# Patient Record
Sex: Female | Born: 1955
Health system: Southern US, Community
[De-identification: ages and names within clinical notes are randomized; demographics above are authoritative.]

## PROBLEM LIST (undated history)

## (undated) DIAGNOSIS — C801 Malignant (primary) neoplasm, unspecified: Secondary | ICD-10-CM

## (undated) DIAGNOSIS — Z803 Family history of malignant neoplasm of breast: Secondary | ICD-10-CM

## (undated) DIAGNOSIS — E785 Hyperlipidemia, unspecified: Secondary | ICD-10-CM

## (undated) HISTORY — DX: Family history of malignant neoplasm of breast: Z80.3

## (undated) HISTORY — PX: CARPAL TUNNEL RELEASE: SHX101

## (undated) HISTORY — PX: TUBAL LIGATION: SHX77

---

## 2005-03-10 ENCOUNTER — Other Ambulatory Visit: Payer: Self-pay

## 2005-03-10 ENCOUNTER — Observation Stay: Payer: Self-pay | Admitting: Internal Medicine

## 2005-03-10 ENCOUNTER — Ambulatory Visit: Payer: Self-pay | Admitting: Internal Medicine

## 2005-06-08 ENCOUNTER — Ambulatory Visit: Payer: Self-pay | Admitting: Internal Medicine

## 2005-09-20 ENCOUNTER — Ambulatory Visit: Payer: Self-pay

## 2009-02-26 ENCOUNTER — Emergency Department: Payer: Self-pay | Admitting: Emergency Medicine

## 2009-04-23 ENCOUNTER — Ambulatory Visit: Payer: Self-pay | Admitting: Internal Medicine

## 2009-05-26 ENCOUNTER — Ambulatory Visit: Payer: Self-pay | Admitting: Gastroenterology

## 2009-11-14 LAB — HM COLONOSCOPY: HM Colonoscopy: NORMAL

## 2009-11-14 LAB — HM PAP SMEAR: HM Pap smear: NORMAL

## 2010-07-22 ENCOUNTER — Ambulatory Visit: Payer: Self-pay | Admitting: Internal Medicine

## 2011-10-18 ENCOUNTER — Telehealth: Payer: Self-pay | Admitting: Internal Medicine

## 2011-10-18 DIAGNOSIS — Z1231 Encounter for screening mammogram for malignant neoplasm of breast: Secondary | ICD-10-CM

## 2011-10-18 NOTE — Telephone Encounter (Signed)
That is fine 

## 2011-10-18 NOTE — Telephone Encounter (Signed)
Pt wants referral for screening mammogram. OK? I scheduled her for physical in May

## 2011-10-18 NOTE — Telephone Encounter (Signed)
Patient would like you to call her at [phone].  She needs a mammogram appointment. She uses [facility].

## 2011-10-18 NOTE — Telephone Encounter (Signed)
Order entered

## 2011-11-10 ENCOUNTER — Telehealth: Payer: Self-pay | Admitting: Internal Medicine

## 2011-11-10 NOTE — Telephone Encounter (Signed)
[phone] Pt called  Pt wanted to get mammogram appointment  She has funny feeling in breast.   She doesn't feel any knot  Just concerned.

## 2011-11-11 NOTE — Telephone Encounter (Signed)
Appointment 4/8 pt aware

## 2011-11-11 NOTE — Telephone Encounter (Signed)
Yes, should be seen to determine if need for ultrasound and/or diagnostic mammo.

## 2011-11-11 NOTE — Telephone Encounter (Signed)
Do you want to see pt for quick OV/breast exam first?

## 2011-11-15 ENCOUNTER — Ambulatory Visit (INDEPENDENT_AMBULATORY_CARE_PROVIDER_SITE_OTHER): Payer: BC Managed Care – PPO | Admitting: Internal Medicine

## 2011-11-15 ENCOUNTER — Encounter: Payer: Self-pay | Admitting: Internal Medicine

## 2011-11-15 VITALS — BP 119/81 | HR 69 | Temp 98.0°F | Ht 64.25 in | Wt 218.2 lb

## 2011-11-15 DIAGNOSIS — Z0000 Encounter for general adult medical examination without abnormal findings: Secondary | ICD-10-CM

## 2011-11-15 DIAGNOSIS — N644 Mastodynia: Secondary | ICD-10-CM

## 2011-11-15 LAB — HM MAMMOGRAPHY

## 2011-11-15 NOTE — Assessment & Plan Note (Signed)
Most likely related to hormonal changes after menopause and/or fibrocystic changes with exposure to caffeine or other agents. Exam is normal today. Will get bilateral diagnostic mammogram for evaluation.

## 2011-11-15 NOTE — Progress Notes (Signed)
  Subjective:    Patient ID: Linda Schmitt, female    DOB: 1955/12/13, 56 y.o.   MRN: [id number]  HPI 55YO female presents for follow up. Primary concern today is several weeks of bilateral breast "tingling" pain. No palpable masses, overlying skin changes, or nipple discharge noted. No correlation with intake of caffeine or other food products. Has not had breast pain in the past. Has never had abnormal mammogram.  No outpatient encounter prescriptions on file as of 11/15/2011.    Review of Systems  Constitutional: Negative for fever, chills, appetite change, fatigue and unexpected weight change.  HENT: Negative for ear pain, congestion, sore throat, trouble swallowing, neck pain, voice change and sinus pressure.   Eyes: Negative for visual disturbance.  Respiratory: Negative for cough, shortness of breath, wheezing and stridor.   Cardiovascular: Positive for chest pain. Negative for palpitations and leg swelling.  Gastrointestinal: Negative for nausea, vomiting, abdominal pain, diarrhea, constipation, blood in stool, abdominal distention and anal bleeding.  Genitourinary: Negative for dysuria and flank pain.  Musculoskeletal: Negative for myalgias, arthralgias and gait problem.  Skin: Negative for color change and rash.  Neurological: Negative for dizziness and headaches.  Hematological: Negative for adenopathy. Does not bruise/bleed easily.  Psychiatric/Behavioral: Negative for suicidal ideas, sleep disturbance and dysphoric mood. The patient is not nervous/anxious.    BP 119/81  Pulse 69  Temp(Src) 98 F (36.7 C) (Oral)  Ht 5' 4.25" (1.632 m)  Wt 218 lb 4 oz (98.998 kg)  BMI 37.17 kg/m2  SpO2 98%     Objective:   Physical Exam  Constitutional: She is oriented to person, place, and time. She appears well-developed and well-nourished. No distress.  HENT:  Head: Normocephalic and atraumatic.  Right Ear: External ear normal.  Left Ear: External ear normal.  Nose: Nose normal.    Mouth/Throat: Oropharynx is clear and moist. No oropharyngeal exudate.  Eyes: Conjunctivae are normal. Pupils are equal, round, and reactive to light. Right eye exhibits no discharge. Left eye exhibits no discharge. No scleral icterus.  Neck: Normal range of motion. Neck supple. No tracheal deviation present. No thyromegaly present.  Cardiovascular: Normal rate, regular rhythm, normal heart sounds and intact distal pulses.  Exam reveals no gallop and no friction rub.   No murmur heard. Pulmonary/Chest: Effort normal and breath sounds normal. No respiratory distress. She has no wheezes. She has no rales. She exhibits no tenderness. Right breast exhibits no inverted nipple, no mass, no nipple discharge, no skin change and no tenderness. Left breast exhibits no inverted nipple, no mass, no nipple discharge, no skin change and no tenderness. Breasts are symmetrical.  Musculoskeletal: Normal range of motion. She exhibits no edema and no tenderness.  Lymphadenopathy:    She has no cervical adenopathy.  Neurological: She is alert and oriented to person, place, and time. No cranial nerve deficit. She exhibits normal muscle tone. Coordination normal.  Skin: Skin is warm and dry. No rash noted. She is not diaphoretic. No erythema. No pallor.  Psychiatric: She has a normal mood and affect. Her behavior is normal. Judgment and thought content normal.          Assessment & Plan:

## 2011-11-16 ENCOUNTER — Ambulatory Visit: Payer: Self-pay | Admitting: Internal Medicine

## 2011-11-19 ENCOUNTER — Other Ambulatory Visit (INDEPENDENT_AMBULATORY_CARE_PROVIDER_SITE_OTHER): Payer: BC Managed Care – PPO | Admitting: *Deleted

## 2011-11-19 DIAGNOSIS — Z0000 Encounter for general adult medical examination without abnormal findings: Secondary | ICD-10-CM

## 2011-11-19 LAB — COMPREHENSIVE METABOLIC PANEL
ALT: 23 U/L (ref 0–35)
AST: 22 U/L (ref 0–37)
Albumin: 4.5 g/dL (ref 3.5–5.2)
Alkaline Phosphatase: 48 U/L (ref 39–117)
BUN: 15 mg/dL (ref 6–23)
CO2: 27 mEq/L (ref 19–32)
Calcium: 9.3 mg/dL (ref 8.4–10.5)
Chloride: 105 mEq/L (ref 96–112)
Creatinine, Ser: 0.8 mg/dL (ref 0.4–1.2)
GFR: 99.03 mL/min (ref >60.00)
Glucose, Bld: 81 mg/dL (ref 70–99)
Potassium: 3.6 mEq/L (ref 3.5–5.1)
Sodium: 142 mEq/L (ref 135–145)
Total Bilirubin: 0.3 mg/dL (ref 0.3–1.2)
Total Protein: 7.3 g/dL (ref 6.0–8.3)

## 2011-11-19 LAB — CBC WITH DIFFERENTIAL/PLATELET
Basophils Absolute: 0 10*3/uL (ref 0.0–0.1)
Basophils Relative: 0.3 % (ref 0.0–3.0)
Eosinophils Absolute: 0.2 10*3/uL (ref 0.0–0.7)
Eosinophils Relative: 2.5 % (ref 0.0–5.0)
HCT: 39.3 % (ref 36.0–46.0)
Hemoglobin: 13.1 g/dL (ref 12.0–15.0)
Lymphocytes Relative: 38.8 % (ref 12.0–46.0)
Lymphs Abs: 3 10*3/uL (ref 0.7–4.0)
MCHC: 33.2 g/dL (ref 30.0–36.0)
MCV: 88.7 fl (ref 78.0–100.0)
Monocytes Absolute: 0.7 10*3/uL (ref 0.1–1.0)
Monocytes Relative: 8.5 % (ref 3.0–12.0)
Neutro Abs: 3.9 10*3/uL (ref 1.4–7.7)
Neutrophils Relative %: 49.9 % (ref 43.0–77.0)
Platelets: 166 10*3/uL (ref 150.0–400.0)
RBC: 4.43 Mil/uL (ref 3.87–5.11)
RDW: 14.4 % (ref 11.5–14.6)
WBC: 7.8 10*3/uL (ref 4.5–10.5)

## 2011-11-19 LAB — LIPID PANEL
Cholesterol: 188 mg/dL (ref 0–200)
HDL: 59.5 mg/dL (ref >39.00)
LDL Cholesterol: 103 mg/dL — ABNORMAL HIGH (ref 0–99)
Total CHOL/HDL Ratio: 3
Triglycerides: 126 mg/dL (ref 0.0–149.0)
VLDL: 25.2 mg/dL (ref 0.0–40.0)

## 2011-12-02 ENCOUNTER — Encounter: Payer: Self-pay | Admitting: Internal Medicine

## 2011-12-15 ENCOUNTER — Encounter: Payer: Self-pay | Admitting: Internal Medicine

## 2011-12-15 ENCOUNTER — Ambulatory Visit (INDEPENDENT_AMBULATORY_CARE_PROVIDER_SITE_OTHER): Payer: BC Managed Care – PPO | Admitting: Internal Medicine

## 2011-12-15 VITALS — BP 120/80 | HR 78 | Temp 98.6°F | Wt 230.5 lb

## 2011-12-15 DIAGNOSIS — Z0000 Encounter for general adult medical examination without abnormal findings: Secondary | ICD-10-CM

## 2011-12-15 NOTE — Assessment & Plan Note (Signed)
General exam including breast and pelvic exam normal today. Mammogram from 11/15/2011 reviewed and was normal. PAP is pending. Vaccinations are UTD, with possible exception of tetanus vaccine, so will request old records. Reviewed labs from 11/2011 which were normal. Encouraged smoking cessation. Follow up 1 year.

## 2011-12-15 NOTE — Progress Notes (Signed)
Subjective:    Patient ID: Linda Schmitt, female    DOB: 1956-05-28, 56 y.o.   MRN: [id number]  HPI 56YO female presents for annual exam. No complaints today. Normal energy level.  Continues to smoke and does not feel ready to quit. Trying to follow healthy diet. No regular exercise program. Notes some increase in depressed mood recently with Mother's Day and recent death of her mother.  Last visit was seen for bilateral painful breasts. Mammogram was normal. Pain has subsided.  No outpatient encounter prescriptions on file as of 12/15/2011.    Review of Systems  Constitutional: Negative for fever, chills, appetite change, fatigue and unexpected weight change.  HENT: Negative for ear pain, congestion, sore throat, trouble swallowing, neck pain, voice change and sinus pressure.   Eyes: Negative for visual disturbance.  Respiratory: Negative for cough, shortness of breath, wheezing and stridor.   Cardiovascular: Negative for chest pain, palpitations and leg swelling.  Gastrointestinal: Negative for nausea, vomiting, abdominal pain, diarrhea, constipation, blood in stool, abdominal distention and anal bleeding.  Genitourinary: Negative for dysuria and flank pain.  Musculoskeletal: Negative for myalgias, arthralgias and gait problem.  Skin: Negative for color change and rash.  Neurological: Negative for dizziness and headaches.  Hematological: Negative for adenopathy. Does not bruise/bleed easily.  Psychiatric/Behavioral: Negative for suicidal ideas, sleep disturbance and dysphoric mood. The patient is not nervous/anxious.    BP 120/80  Pulse 78  Temp(Src) 98.6 F (37 C) (Oral)  Wt 230 lb 8 oz (104.554 kg)  SpO2 98%     Objective:   Physical Exam  Constitutional: She is oriented to person, place, and time. She appears well-developed and well-nourished. No distress.  HENT:  Head: Normocephalic and atraumatic.  Right Ear: External ear normal.  Left Ear: External ear normal.  Nose:  Nose normal.  Mouth/Throat: Oropharynx is clear and moist. No oropharyngeal exudate.  Eyes: Conjunctivae are normal. Pupils are equal, round, and reactive to light. Right eye exhibits no discharge. Left eye exhibits no discharge. No scleral icterus.  Neck: Normal range of motion. Neck supple. No tracheal deviation present. No thyromegaly present.  Cardiovascular: Normal rate, regular rhythm, normal heart sounds and intact distal pulses.  Exam reveals no gallop and no friction rub.   No murmur heard. Pulmonary/Chest: Effort normal and breath sounds normal. No respiratory distress. She has no wheezes. She has no rales. She exhibits no tenderness.  Abdominal: Soft. Bowel sounds are normal. She exhibits no distension and no mass. There is no tenderness. There is no rebound and no guarding.  Genitourinary: Rectum normal, vagina normal and uterus normal. No breast swelling, tenderness, discharge or bleeding. Pelvic exam was performed with patient supine. There is no rash, tenderness or lesion on the right labia. There is no rash, tenderness or lesion on the left labia. Uterus is not enlarged and not tender. Cervix exhibits no motion tenderness, no discharge and no friability. Right adnexum displays no mass, no tenderness and no fullness. Left adnexum displays no mass, no tenderness and no fullness. No erythema or tenderness around the vagina. No vaginal discharge found.  Musculoskeletal: Normal range of motion. She exhibits no edema and no tenderness.  Lymphadenopathy:    She has no cervical adenopathy.  Neurological: She is alert and oriented to person, place, and time. No cranial nerve deficit. She exhibits normal muscle tone. Coordination normal.  Skin: Skin is warm and dry. No rash noted. She is not diaphoretic. No erythema. No pallor.  Psychiatric: She has a normal  mood and affect. Her behavior is normal. Judgment and thought content normal.          Assessment & Plan:

## 2011-12-16 ENCOUNTER — Other Ambulatory Visit (HOSPITAL_COMMUNITY)
Admission: RE | Admit: 2011-12-16 | Discharge: 2011-12-16 | Disposition: A | Discharge status: Home / Self Care | Payer: BC Managed Care – PPO | Source: Ambulatory Visit | Attending: Internal Medicine | Admitting: Internal Medicine

## 2011-12-16 DIAGNOSIS — Z01419 Encounter for gynecological examination (general) (routine) without abnormal findings: Secondary | ICD-10-CM | POA: Insufficient documentation

## 2011-12-16 DIAGNOSIS — Z1159 Encounter for screening for other viral diseases: Secondary | ICD-10-CM | POA: Insufficient documentation

## 2011-12-16 NOTE — Progress Notes (Signed)
Addended by: Jobie Quaker on: 12/16/2011 08:07 AM   Modules accepted: Orders

## 2011-12-24 ENCOUNTER — Ambulatory Visit (INDEPENDENT_AMBULATORY_CARE_PROVIDER_SITE_OTHER): Payer: BC Managed Care – PPO | Admitting: *Deleted

## 2011-12-24 DIAGNOSIS — Z23 Encounter for immunization: Secondary | ICD-10-CM

## 2012-01-14 LAB — HM PAP SMEAR: HM Pap smear: NEGATIVE

## 2012-01-19 ENCOUNTER — Encounter: Payer: BC Managed Care – PPO | Admitting: Internal Medicine

## 2012-02-19 ENCOUNTER — Emergency Department: Payer: Self-pay | Admitting: Emergency Medicine

## 2012-11-03 ENCOUNTER — Telehealth: Payer: Self-pay | Admitting: Internal Medicine

## 2012-11-03 NOTE — Telephone Encounter (Signed)
Patient needing a mammogram order and appointment scheduled.

## 2012-11-03 NOTE — Telephone Encounter (Signed)
Fwd to Dr. Dan Humphreys to place order

## 2012-11-03 NOTE — Telephone Encounter (Signed)
There are two mammogram orders in her chart.

## 2012-11-06 NOTE — Telephone Encounter (Signed)
Informed patient orders has been placed

## 2012-11-09 NOTE — Telephone Encounter (Signed)
Patient is scheduled for a screening mammo on 4/30 @ 340. I have mailed the patient a letter.

## 2012-11-09 NOTE — Telephone Encounter (Signed)
Noted  

## 2012-12-06 ENCOUNTER — Ambulatory Visit: Payer: Self-pay | Admitting: Internal Medicine

## 2012-12-18 ENCOUNTER — Encounter: Payer: BC Managed Care – PPO | Admitting: Internal Medicine

## 2013-01-03 ENCOUNTER — Encounter: Payer: Self-pay | Admitting: Internal Medicine

## 2013-11-06 ENCOUNTER — Telehealth: Payer: Self-pay | Admitting: Internal Medicine

## 2013-11-06 NOTE — Telephone Encounter (Signed)
Pt left vm.  States she would like Dr. Gilford Rile to call her; she has something she would like to discuss with her.

## 2013-11-07 NOTE — Telephone Encounter (Signed)
Returned patient call and appointment scheduled with Raquel. Patient having a funny burning sensation in her breast.

## 2013-11-07 NOTE — Telephone Encounter (Signed)
Pt called back wanting to know if its time for her mammogram and if so please make appointment [facility]   [phone]

## 2013-11-12 ENCOUNTER — Ambulatory Visit (INDEPENDENT_AMBULATORY_CARE_PROVIDER_SITE_OTHER): Payer: Self-pay | Admitting: Adult Health

## 2013-11-12 ENCOUNTER — Encounter: Payer: Self-pay | Admitting: Adult Health

## 2013-11-12 VITALS — BP 122/76 | HR 79 | Temp 98.2°F | Wt 234.0 lb

## 2013-11-12 DIAGNOSIS — Z1239 Encounter for other screening for malignant neoplasm of breast: Secondary | ICD-10-CM

## 2013-11-12 DIAGNOSIS — N644 Mastodynia: Secondary | ICD-10-CM | POA: Insufficient documentation

## 2013-11-12 NOTE — Progress Notes (Signed)
Patient ID: Linda Schmitt, female   DOB: 11/09/1955, 57 y.o.   MRN: [id number]    Subjective:    Patient ID: Linda Schmitt, female    DOB: Sep 26, 1955, 58 y.o.   MRN: [id number]  HPI  Ancient is a pleasant 58 year old female who presents to clinic with concerns of left breast pain. She also reports pain in the right. Pain is intermittent. She first noticed the discomfort approximately 3-4 weeks ago. She reports improvement today. She does self breast exams and has not noticed any abnormal findings. Last mammogram was April 2014.  Review of Systems  Genitourinary:       Bilateral breast pain. No nipple discharge. No skin changes.       Objective:  BP 122/76  Pulse 79  Temp(Src) 98.2 F (36.8 C) (Oral)  Wt 234 lb (106.142 kg)  SpO2 97%   Physical Exam  Pulmonary/Chest: Right breast exhibits no inverted nipple, no mass, no nipple discharge, no skin change and no tenderness. Left breast exhibits no inverted nipple, no mass, no nipple discharge, no skin change and no tenderness. Breasts are symmetrical.       Assessment & Plan:   1. Breast pain Normal breast exam. Discussed abnormal findings to report immediately such as hard mass/lump, nipple discharge. Assured her that pain in the breast is not necessarily a sign of a problem. She was appreciative of being seen and evaluated.  2. Screening for breast cancer She is due for her yearly mammogram. Will schedule this today before she leaves the office. - MM DIGITAL SCREENING BILATERAL; Future

## 2013-11-12 NOTE — Progress Notes (Signed)
Pre visit review using our clinic review tool, if applicable. No additional management support is needed unless otherwise documented below in the visit note. 

## 2013-11-13 ENCOUNTER — Telehealth: Payer: Self-pay | Admitting: Internal Medicine

## 2013-11-13 NOTE — Telephone Encounter (Signed)
Relevant patient education mailed to patient.  

## 2013-12-10 ENCOUNTER — Ambulatory Visit: Payer: Self-pay | Admitting: Adult Health

## 2013-12-10 LAB — HM MAMMOGRAPHY: HM Mammogram: NORMAL

## 2015-01-14 ENCOUNTER — Ambulatory Visit (INDEPENDENT_AMBULATORY_CARE_PROVIDER_SITE_OTHER): Payer: No Typology Code available for payment source | Admitting: Internal Medicine

## 2015-01-14 ENCOUNTER — Encounter: Payer: Self-pay | Admitting: Internal Medicine

## 2015-01-14 VITALS — BP 106/68 | HR 74 | Temp 97.3°F | Wt 229.5 lb

## 2015-01-14 DIAGNOSIS — Z634 Disappearance and death of family member: Secondary | ICD-10-CM | POA: Insufficient documentation

## 2015-01-14 DIAGNOSIS — Z0000 Encounter for general adult medical examination without abnormal findings: Secondary | ICD-10-CM | POA: Diagnosis not present

## 2015-01-14 DIAGNOSIS — Z1239 Encounter for other screening for malignant neoplasm of breast: Secondary | ICD-10-CM

## 2015-01-14 LAB — LIPID PANEL
Cholesterol: 175 mg/dL (ref 0–200)
HDL: 54.1 mg/dL (ref >39.00)
LDL Cholesterol: 103 mg/dL — ABNORMAL HIGH (ref 0–99)
NonHDL: 120.9
Total CHOL/HDL Ratio: 3
Triglycerides: 89 mg/dL (ref 0.0–149.0)
VLDL: 17.8 mg/dL (ref 0.0–40.0)

## 2015-01-14 LAB — CBC WITH DIFFERENTIAL/PLATELET
Basophils Absolute: 0 10*3/uL (ref 0.0–0.1)
Basophils Relative: 0.5 % (ref 0.0–3.0)
Eosinophils Absolute: 0.1 10*3/uL (ref 0.0–0.7)
Eosinophils Relative: 1.8 % (ref 0.0–5.0)
HCT: 43 % (ref 36.0–46.0)
Hemoglobin: 14 g/dL (ref 12.0–15.0)
Lymphocytes Relative: 36.3 % (ref 12.0–46.0)
Lymphs Abs: 2.3 10*3/uL (ref 0.7–4.0)
MCHC: 32.7 g/dL (ref 30.0–36.0)
MCV: 87.8 fl (ref 78.0–100.0)
Monocytes Absolute: 0.5 10*3/uL (ref 0.1–1.0)
Monocytes Relative: 8.1 % (ref 3.0–12.0)
Neutro Abs: 3.4 10*3/uL (ref 1.4–7.7)
Neutrophils Relative %: 53.3 % (ref 43.0–77.0)
Platelets: 165 10*3/uL (ref 150.0–400.0)
RBC: 4.89 Mil/uL (ref 3.87–5.11)
RDW: 15 % (ref 11.5–15.5)
WBC: 6.4 10*3/uL (ref 4.0–10.5)

## 2015-01-14 LAB — COMPREHENSIVE METABOLIC PANEL
ALT: 18 U/L (ref 0–35)
AST: 19 U/L (ref 0–37)
Albumin: 4.3 g/dL (ref 3.5–5.2)
Alkaline Phosphatase: 46 U/L (ref 39–117)
BUN: 13 mg/dL (ref 6–23)
CO2: 26 mEq/L (ref 19–32)
Calcium: 9.2 mg/dL (ref 8.4–10.5)
Chloride: 105 mEq/L (ref 96–112)
Creatinine, Ser: 0.89 mg/dL (ref 0.40–1.20)
GFR: 82.89 mL/min (ref >60.00)
Glucose, Bld: 98 mg/dL (ref 70–99)
Potassium: 3.8 mEq/L (ref 3.5–5.1)
Sodium: 137 mEq/L (ref 135–145)
Total Bilirubin: 0.4 mg/dL (ref 0.2–1.2)
Total Protein: 6.9 g/dL (ref 6.0–8.3)

## 2015-01-14 LAB — TSH: TSH: 1.03 u[IU]/mL (ref 0.35–4.50)

## 2015-01-14 LAB — MICROALBUMIN / CREATININE URINE RATIO
Creatinine,U: 132.4 mg/dL
Microalb Creat Ratio: 0.8 mg/g (ref 0.0–30.0)
Microalb, Ur: 1 mg/dL (ref 0.0–1.9)

## 2015-01-14 LAB — HEMOGLOBIN A1C: Hgb A1c MFr Bld: 6.1 % (ref 4.6–6.5)

## 2015-01-14 LAB — VITAMIN D 25 HYDROXY (VIT D DEFICIENCY, FRACTURES): VITD: 15.09 ng/mL — ABNORMAL LOW (ref 30.00–100.00)

## 2015-01-14 NOTE — Assessment & Plan Note (Signed)
General medical exam normal today including breast exam. PAP and pelvic deferred as normal 2013, plan repeat 2018. Mammogram ordered. Colonoscopy UTD, results requested. Labs as ordered. Immunizations UTD. Encouraged smoking cessation.

## 2015-01-14 NOTE — Assessment & Plan Note (Signed)
Offered support today. Will set up counseling through [facility].

## 2015-01-14 NOTE — Progress Notes (Signed)
Subjective:    Patient ID: Linda Schmitt, female    DOB: [birth year], 59 y.o.   MRN: [id number]  HPI  59YO female presents for annual exam.  Difficult time for her. Her son was murdered earlier this year.  Physically, feeling well. Trying to eat healthier and exercise to lose weight.  No other new concerns today.  Past medical, surgical, family and social history per today's encounter.  Review of Systems  Constitutional: Negative for fever, chills, appetite change, fatigue and unexpected weight change.  Eyes: Negative for visual disturbance.  Respiratory: Negative for shortness of breath.   Cardiovascular: Negative for chest pain and leg swelling.  Gastrointestinal: Negative for nausea, vomiting, abdominal pain, diarrhea and constipation.  Skin: Negative for color change and rash.  Hematological: Negative for adenopathy. Does not bruise/bleed easily.  Psychiatric/Behavioral: Positive for dysphoric mood. Negative for suicidal ideas and sleep disturbance. The patient is not nervous/anxious.        Objective:    BP 106/68 mmHg  Pulse 74  Temp(Src) 97.3 F (36.3 C) (Oral)  Wt 229 lb 8 oz (104.101 kg)  SpO2 96% Physical Exam  Constitutional: She is oriented to person, place, and time. She appears well-developed and well-nourished. No distress.  HENT:  Head: Normocephalic and atraumatic.  Right Ear: External ear normal.  Left Ear: External ear normal.  Nose: Nose normal.  Mouth/Throat: Oropharynx is clear and moist. No oropharyngeal exudate.  Eyes: Conjunctivae are normal. Pupils are equal, round, and reactive to light. Right eye exhibits no discharge. Left eye exhibits no discharge. No scleral icterus.  Neck: Normal range of motion. Neck supple. No tracheal deviation present. No thyromegaly present.  Cardiovascular: Normal rate, regular rhythm, normal heart sounds and intact distal pulses.  Exam reveals no gallop and no friction rub.   No murmur heard. Pulmonary/Chest:  Effort normal and breath sounds normal. No accessory muscle usage. No tachypnea. No respiratory distress. She has no decreased breath sounds. She has no wheezes. She has no rales. She exhibits no tenderness. Right breast exhibits no inverted nipple, no mass, no nipple discharge, no skin change and no tenderness. Left breast exhibits no inverted nipple, no mass, no nipple discharge, no skin change and no tenderness. Breasts are symmetrical.  Abdominal: Soft. Bowel sounds are normal. She exhibits no distension and no mass. There is no tenderness. There is no rebound and no guarding.  Musculoskeletal: Normal range of motion. She exhibits no edema or tenderness.  Lymphadenopathy:    She has no cervical adenopathy.  Neurological: She is alert and oriented to person, place, and time. No cranial nerve deficit. She exhibits normal muscle tone. Coordination normal.  Skin: Skin is warm and dry. No rash noted. She is not diaphoretic. No erythema. No pallor.  Psychiatric: She has a normal mood and affect. Her behavior is normal. Judgment and thought content normal.          Assessment & Plan:   Problem List Items Addressed This Visit      Unprioritized   Bereavement    Offered support today. Will set up counseling through [facility].      Relevant Orders   Ambulatory referral to Psychology   Routine general medical examination at a health care facility - Primary    General medical exam normal today including breast exam. PAP and pelvic deferred as normal 2013, plan repeat 2018. Mammogram ordered. Colonoscopy UTD, results requested. Labs as ordered. Immunizations UTD. Encouraged smoking cessation.  Relevant Orders   TSH   Hemoglobin A1c   CBC with Differential/Platelet   Comprehensive metabolic panel   Lipid panel   Microalbumin / creatinine urine ratio   Vit D  25 hydroxy (rtn osteoporosis monitoring)   Screening for breast cancer   Relevant Orders   MM Digital Screening         Return in about 1 year (around 01/14/2016) for Physical.

## 2015-01-14 NOTE — Patient Instructions (Signed)

## 2015-01-14 NOTE — Progress Notes (Signed)
Pre visit review using our clinic review tool, if applicable. No additional management support is needed unless otherwise documented below in the visit note. 

## 2015-01-15 ENCOUNTER — Encounter: Payer: Self-pay | Admitting: *Deleted

## 2015-01-21 ENCOUNTER — Ambulatory Visit
Admission: RE | Admit: 2015-01-21 | Discharge: 2015-01-21 | Disposition: A | Discharge status: Home / Self Care | Payer: No Typology Code available for payment source | Source: Ambulatory Visit | Attending: Internal Medicine | Admitting: Internal Medicine

## 2015-01-21 DIAGNOSIS — Z1239 Encounter for other screening for malignant neoplasm of breast: Secondary | ICD-10-CM

## 2015-01-21 DIAGNOSIS — Z1231 Encounter for screening mammogram for malignant neoplasm of breast: Secondary | ICD-10-CM | POA: Insufficient documentation

## 2015-01-22 ENCOUNTER — Other Ambulatory Visit: Payer: Self-pay | Admitting: Internal Medicine

## 2015-01-22 DIAGNOSIS — R928 Other abnormal and inconclusive findings on diagnostic imaging of breast: Secondary | ICD-10-CM

## 2015-01-29 ENCOUNTER — Ambulatory Visit
Admission: RE | Admit: 2015-01-29 | Discharge: 2015-01-29 | Disposition: A | Discharge status: Home / Self Care | Payer: No Typology Code available for payment source | Source: Ambulatory Visit | Attending: Internal Medicine | Admitting: Internal Medicine

## 2015-01-29 DIAGNOSIS — R928 Other abnormal and inconclusive findings on diagnostic imaging of breast: Secondary | ICD-10-CM | POA: Insufficient documentation

## 2015-01-29 DIAGNOSIS — H5315 Visual distortions of shape and size: Secondary | ICD-10-CM | POA: Diagnosis present

## 2016-01-15 ENCOUNTER — Other Ambulatory Visit: Payer: Self-pay | Admitting: Internal Medicine

## 2016-01-15 DIAGNOSIS — Z1231 Encounter for screening mammogram for malignant neoplasm of breast: Secondary | ICD-10-CM

## 2016-01-28 ENCOUNTER — Ambulatory Visit
Admission: RE | Admit: 2016-01-28 | Discharge: 2016-01-28 | Disposition: A | Discharge status: Home / Self Care | Payer: BLUE CROSS/BLUE SHIELD | Source: Ambulatory Visit | Attending: Internal Medicine | Admitting: Internal Medicine

## 2016-01-28 DIAGNOSIS — Z1231 Encounter for screening mammogram for malignant neoplasm of breast: Secondary | ICD-10-CM | POA: Insufficient documentation

## 2016-01-29 NOTE — Progress Notes (Signed)
Patient was informed of results.  Patient understood and no questions, comments, or concerns at this time.  

## 2016-05-23 ENCOUNTER — Encounter: Payer: Self-pay | Admitting: Emergency Medicine

## 2016-05-23 ENCOUNTER — Emergency Department: Payer: BLUE CROSS/BLUE SHIELD

## 2016-05-23 ENCOUNTER — Emergency Department
Admission: EM | Admit: 2016-05-23 | Discharge: 2016-05-24 | Disposition: A | Discharge status: Home / Self Care | Payer: BLUE CROSS/BLUE SHIELD | Attending: Emergency Medicine | Admitting: Emergency Medicine

## 2016-05-23 DIAGNOSIS — R072 Precordial pain: Secondary | ICD-10-CM | POA: Diagnosis present

## 2016-05-23 DIAGNOSIS — F17210 Nicotine dependence, cigarettes, uncomplicated: Secondary | ICD-10-CM | POA: Insufficient documentation

## 2016-05-23 DIAGNOSIS — R079 Chest pain, unspecified: Secondary | ICD-10-CM | POA: Diagnosis not present

## 2016-05-23 LAB — BASIC METABOLIC PANEL
Anion gap: 9 (ref 5–15)
BUN: 16 mg/dL (ref 6–20)
CO2: 24 mmol/L (ref 22–32)
Calcium: 9.2 mg/dL (ref 8.9–10.3)
Chloride: 109 mmol/L (ref 101–111)
Creatinine, Ser: 0.98 mg/dL (ref 0.44–1.00)
GFR calc Af Amer: >60 mL/min (ref >60)
GFR calc non Af Amer: >60 mL/min (ref >60)
Glucose, Bld: 95 mg/dL (ref 65–99)
Potassium: 3.2 mmol/L — ABNORMAL LOW (ref 3.5–5.1)
Sodium: 142 mmol/L (ref 135–145)

## 2016-05-23 LAB — CBC
HCT: 42.1 % (ref 35.0–47.0)
Hemoglobin: 14.1 g/dL (ref 12.0–16.0)
MCH: 30 pg (ref 26.0–34.0)
MCHC: 33.5 g/dL (ref 32.0–36.0)
MCV: 89.3 fL (ref 80.0–100.0)
Platelets: 160 10*3/uL (ref 150–440)
RBC: 4.72 MIL/uL (ref 3.80–5.20)
RDW: 14.4 % (ref 11.5–14.5)
WBC: 9.9 10*3/uL (ref 3.6–11.0)

## 2016-05-23 LAB — TROPONIN I
Troponin I: <0.03 ng/mL (ref <0.03)
Troponin I: <0.03 ng/mL (ref <0.03)

## 2016-05-23 LAB — FIBRIN DERIVATIVES D-DIMER (ARMC ONLY): Fibrin derivatives D-dimer (ARMC): 606 — ABNORMAL HIGH (ref 0–499)

## 2016-05-23 MED ORDER — IOPAMIDOL (ISOVUE-370) INJECTION 76%
75.0000 mL | Freq: Once | INTRAVENOUS | Status: AC | PRN
  Administered 2016-05-23: 75 mL via INTRAVENOUS

## 2016-05-23 MED ORDER — ASPIRIN 81 MG PO CHEW
324.0000 mg | CHEWABLE_TABLET | Freq: Once | ORAL | Status: AC
  Administered 2016-05-23: 324 mg via ORAL
  Filled 2016-05-23: qty 4

## 2016-05-23 NOTE — ED Notes (Deleted)
Patient transported back from CT by radiology tech.

## 2016-05-23 NOTE — ED Notes (Signed)
Patient transported to CT 

## 2016-05-23 NOTE — ED Provider Notes (Signed)
[facility] Emergency Department Provider Note  ____________________________________________  Time seen: Approximately 9:03 PM  I have reviewed the triage vital signs and the nursing notes.   HISTORY  Chief Complaint Chest Pain   HPI KYELLE BRUSHABER is a 60 y.o. female with no significant PMH who presents for evaluation of cp. Patient reports that she was laying down to rest around 5:30 PM when she developed substernal chest pain. She reports that the pain was a burning sensation in the middle of her chest associated with a muscle pulling sensation on the right lateral aspect of her chest. The burning sensation resolved but the pulling sensation remained. Patient denies ever having chest pain or reflux. She reports that she hadn't eaten anything in the hours preceding chest pain. She denies shortness of breath, dizziness, nausea, vomiting, abdominal pain, leg pain or swelling, personal or family history of ischemic heart disease or blood clots, recent travel or immobilization. She currently endorses her pain is 5 out of 10. She is a smoker.  History reviewed. No pertinent past medical history.  Patient Active Problem List   Diagnosis Date Noted  . Routine general medical examination at a health care facility 01/14/2015  . Bereavement 01/14/2015  . Screening for breast cancer 11/12/2013    Past Surgical History:  Procedure Laterality Date  . TUBAL LIGATION      Prior to Admission medications   Not on File    Allergies Review of patient's allergies indicates no known allergies.  Family History  Problem Relation Age of Onset  . Cancer Mother     lymphoma  . Stroke Sister   . Cancer Brother     lung  . Cancer Sister     lung?  . Breast cancer Cousin 49    maternal    Social History Social History  Substance Use Topics  . Smoking status: Current Every Day Smoker    Packs/day: 0.30    Years: 10.00    Types: Cigarettes  . Smokeless  tobacco: Never Used  . Alcohol use No    Review of Systems  Constitutional: Negative for fever. Eyes: Negative for visual changes. ENT: Negative for sore throat. Cardiovascular: + chest pain. Respiratory: Negative for shortness of breath. Gastrointestinal: Negative for abdominal pain, vomiting or diarrhea. Genitourinary: Negative for dysuria. Musculoskeletal: Negative for back pain. Skin: Negative for rash. Neurological: Negative for headaches, weakness or numbness.  ____________________________________________   PHYSICAL EXAM:  VITAL SIGNS: ED Triage Vitals [05/23/16 1811]  Enc Vitals Group     BP 137/78     Pulse Rate 92     Resp 18     Temp 97.7 F (36.5 C)     Temp Source Oral     SpO2 97 %     Weight 190 lb (86.2 kg)     Height 5\' 3"  (1.6 m)     Head Circumference      Peak Flow      Pain Score 4     Pain Loc      Pain Edu?      Excl. in [place]?     Constitutional: Alert and oriented. Well appearing and in no apparent distress. HEENT:      Head: Normocephalic and atraumatic.         Eyes: Conjunctivae are normal. Sclera is non-icteric. EOMI. PERRL      Mouth/Throat: Mucous membranes are moist.       Neck: Supple with no signs of meningismus.  Cardiovascular: Regular rate and rhythm. No murmurs, gallops, or rubs. 2+ symmetrical distal pulses are present in all extremities. No JVD. Respiratory: Normal respiratory effort. Lungs are clear to auscultation bilaterally. No wheezes, crackles, or rhonchi.  Gastrointestinal: Soft, non tender, and non distended with positive bowel sounds. No rebound or guarding. Musculoskeletal: Nontender with normal range of motion in all extremities. No edema, cyanosis, or erythema of extremities. Neurologic: Normal speech and language. Face is symmetric. Moving all extremities. No gross focal neurologic deficits are appreciated. Skin: Skin is warm, dry and intact. No rash noted. Psychiatric: Mood and affect are normal. Speech and  behavior are normal.  ____________________________________________   LABS (all labs ordered are listed, but only abnormal results are displayed)  Labs Reviewed  BASIC METABOLIC PANEL - Abnormal; Notable for the following:       Result Value   Potassium 3.2 (*)    All other components within normal limits  FIBRIN DERIVATIVES D-DIMER ([facility] ONLY) - Abnormal; Notable for the following:    Fibrin derivatives D-dimer ([facility]) 606 (*)    All other components within normal limits  CBC  TROPONIN I  TROPONIN I   ____________________________________________  EKG  ED ECG REPORT I, Rudene Re, the attending physician, personally viewed and interpreted this ECG.  Normal sinus rhythm, rate of 92, normal intervals, normal axis, no ST elevations or depressions. Normal EKG  ____________________________________________  RADIOLOGY  CXR:  Negative ____________________________________________   PROCEDURES  Procedure(s) performed: None Procedures Critical Care performed:  None ____________________________________________   INITIAL IMPRESSION / ASSESSMENT AND PLAN / ED COURSE   60 y.o. female with no significant PMH who presents for evaluation of cp. Chest pain in a 60 y.o. female with low suspicion for cardiac (HEART score 2) or other serious etiology (including aortic dissection, pneumonia, pneumothorax, or pulmonary embolism) based her history and physical exam in the ED today. EKG normal. Plan for labs including CBC, chemistries and troponin now and in 3 hours, CXR and re-evaluation for disposition. Will give full dose ASA . Will observe patient on cardiac monitor while in the ED and pain control.     Clinical Course  Comment By Time  Troponin x 2 negative. CT pending to rule PE as d-dimer was elevated. Plan to DC home if CT negative with follow up with Cardiology. Rudene Re, MD 10/15 2254    Pertinent labs & imaging results that were available during my care of the  patient were reviewed by me and considered in my medical decision making (see chart for details).    ____________________________________________   FINAL CLINICAL IMPRESSION(S) / ED DIAGNOSES  Final diagnoses:  Chest pain, unspecified type      NEW MEDICATIONS STARTED DURING THIS VISIT:  There are no discharge medications for this patient.    Note:  This document was prepared using Dragon voice recognition software and may include unintentional dictation errors.    Rudene Re, MD 05/24/16 1039

## 2016-05-23 NOTE — ED Triage Notes (Signed)
Pt c/o onset pain across entire chest that started today. Has had some dizziness/shaking per pt but denies SHOB or nausea. No distress in triage.

## 2016-05-23 NOTE — Discharge Instructions (Addendum)

## 2016-05-24 NOTE — ED Provider Notes (Signed)
-----------------------------------------   12:54 AM on 05/24/2016 -----------------------------------------   Blood pressure 113/61, pulse 69, temperature 97.7 F (36.5 C), temperature source Oral, resp. rate 16, height 5\' 3"  (1.6 m), weight 190 lb (86.2 kg), SpO2 99 %.  Assuming care from Dr. Kelli Hope.  In short, Linda Schmitt is a 60 y.o. female with a chief complaint of Chest Pain .  Refer to the original H&P for additional details.  The current plan of care is to follow up the results of the CT chest.   CT angio chest: No acute intrathoracic pathology. No CT evidence of pulmonary embolism.  The patient will be discharged to home   Loney Hering, MD 05/24/16 [phone]

## 2016-09-13 ENCOUNTER — Encounter: Payer: Self-pay | Admitting: Family

## 2016-09-13 ENCOUNTER — Ambulatory Visit (INDEPENDENT_AMBULATORY_CARE_PROVIDER_SITE_OTHER): Payer: BLUE CROSS/BLUE SHIELD | Admitting: Family

## 2016-09-13 ENCOUNTER — Encounter: Payer: BLUE CROSS/BLUE SHIELD | Admitting: Family

## 2016-09-13 ENCOUNTER — Other Ambulatory Visit (HOSPITAL_COMMUNITY)
Admission: RE | Admit: 2016-09-13 | Discharge: 2016-09-13 | Disposition: A | Discharge status: Home / Self Care | Payer: BLUE CROSS/BLUE SHIELD | Source: Ambulatory Visit | Attending: Family | Admitting: Family

## 2016-09-13 VITALS — BP 130/78 | HR 90 | Temp 98.2°F | Ht 63.0 in | Wt 224.0 lb

## 2016-09-13 DIAGNOSIS — Z01419 Encounter for gynecological examination (general) (routine) without abnormal findings: Secondary | ICD-10-CM | POA: Diagnosis present

## 2016-09-13 DIAGNOSIS — Z1151 Encounter for screening for human papillomavirus (HPV): Secondary | ICD-10-CM | POA: Diagnosis present

## 2016-09-13 DIAGNOSIS — R51 Headache: Secondary | ICD-10-CM

## 2016-09-13 DIAGNOSIS — Z0000 Encounter for general adult medical examination without abnormal findings: Secondary | ICD-10-CM | POA: Diagnosis not present

## 2016-09-13 DIAGNOSIS — R519 Headache, unspecified: Secondary | ICD-10-CM

## 2016-09-13 DIAGNOSIS — F339 Major depressive disorder, recurrent, unspecified: Secondary | ICD-10-CM

## 2016-09-13 DIAGNOSIS — G8929 Other chronic pain: Secondary | ICD-10-CM

## 2016-09-13 NOTE — Patient Instructions (Signed)
Fasting labs in your convenience.  Please keep a headache diary and schedule follow up in one month.   Health Maintenance, Female Introduction Adopting a healthy lifestyle and getting preventive care can go a long way to promote health and wellness. Talk with your health care provider about what schedule of regular examinations is right for you. This is a good chance for you to check in with your provider about disease prevention and staying healthy. In between checkups, there are plenty of things you can do on your own. Experts have done a lot of research about which lifestyle changes and preventive measures are most likely to keep you healthy. Ask your health care provider for more information. Weight and diet Eat a healthy diet  Be sure to include plenty of vegetables, fruits, low-fat dairy products, and lean protein.  Do not eat a lot of foods high in solid fats, added sugars, or salt.  Get regular exercise. This is one of the most important things you can do for your health.  Most adults should exercise for at least 150 minutes each week. The exercise should increase your heart rate and make you sweat (moderate-intensity exercise).  Most adults should also do strengthening exercises at least twice a week. This is in addition to the moderate-intensity exercise. Maintain a healthy weight  Body mass index (BMI) is a measurement that can be used to identify possible weight problems. It estimates body fat based on height and weight. Your health care provider can help determine your BMI and help you achieve or maintain a healthy weight.  For females 63 years of age and older:  A BMI below 18.5 is considered underweight.  A BMI of 18.5 to 24.9 is normal.  A BMI of 25 to 29.9 is considered overweight.  A BMI of 30 and above is considered obese. Watch levels of cholesterol and blood lipids  You should start having your blood tested for lipids and cholesterol at 63 years of age, then  have this test every 5 years.  You may need to have your cholesterol levels checked more often if:  Your lipid or cholesterol levels are high.  You are older than 63 years of age.  You are at high risk for heart disease. Cancer screening Lung Cancer  Lung cancer screening is recommended for adults 35-66 years old who are at high risk for lung cancer because of a history of smoking.  A yearly low-dose CT scan of the lungs is recommended for people who:  Currently smoke.  Have quit within the past 15 years.  Have at least a 30-pack-year history of smoking. A pack year is smoking an average of one pack of cigarettes a day for 1 year.  Yearly screening should continue until it has been 15 years since you quit.  Yearly screening should stop if you develop a health problem that would prevent you from having lung cancer treatment. Breast Cancer  Practice breast self-awareness. This means understanding how your breasts normally appear and feel.  It also means doing regular breast self-exams. Let your health care provider know about any changes, no matter how small.  If you are in your 63s or 30s, you should have a clinical breast exam (CBE) by a health care provider every 1-3 years as part of a regular health exam.  If you are 63 or older, have a CBE every year. Also consider having a breast X-ray (mammogram) every year.  If you have a family history of breast  cancer, talk to your health care provider about genetic screening.  If you are at high risk for breast cancer, talk to your health care provider about having an MRI and a mammogram every year.  Breast cancer gene (BRCA) assessment is recommended for women who have family members with BRCA-related cancers. BRCA-related cancers include:  Breast.  Ovarian.  Tubal.  Peritoneal cancers.  Results of the assessment will determine the need for genetic counseling and BRCA1 and BRCA2 testing. Cervical Cancer  Your health care  provider may recommend that you be screened regularly for cancer of the pelvic organs (ovaries, uterus, and vagina). This screening involves a pelvic examination, including checking for microscopic changes to the surface of your cervix (Pap test). You may be encouraged to have this screening done every 3 years, beginning at age 63.  For women ages 63-65, health care providers may recommend pelvic exams and Pap testing every 3 years, or they may recommend the Pap and pelvic exam, combined with testing for human papilloma virus (HPV), every 5 years. Some types of HPV increase your risk of cervical cancer. Testing for HPV may also be done on women of any age with unclear Pap test results.  Other health care providers may not recommend any screening for nonpregnant women who are considered low risk for pelvic cancer and who do not have symptoms. Ask your health care provider if a screening pelvic exam is right for you.  If you have had past treatment for cervical cancer or a condition that could lead to cancer, you need Pap tests and screening for cancer for at least 20 years after your treatment. If Pap tests have been discontinued, your risk factors (such as having a new sexual partner) need to be reassessed to determine if screening should resume. Some women have medical problems that increase the chance of getting cervical cancer. In these cases, your health care provider may recommend more frequent screening and Pap tests. Colorectal Cancer  This type of cancer can be detected and often prevented.  Routine colorectal cancer screening usually begins at 63 years of age and continues through 63 years of age.  Your health care provider may recommend screening at an earlier age if you have risk factors for colon cancer.  Your health care provider may also recommend using home test kits to check for hidden blood in the stool.  A small camera at the end of a tube can be used to examine your colon directly  (sigmoidoscopy or colonoscopy). This is done to check for the earliest forms of colorectal cancer.  Routine screening usually begins at age 4.  Direct examination of the colon should be repeated every 5-10 years through 63 years of age. However, you may need to be screened more often if early forms of precancerous polyps or small growths are found. Skin Cancer  Check your skin from head to toe regularly.  Tell your health care provider about any new moles or changes in moles, especially if there is a change in a mole's shape or color.  Also tell your health care provider if you have a mole that is larger than the size of a pencil eraser.  Always use sunscreen. Apply sunscreen liberally and repeatedly throughout the day.  Protect yourself by wearing long sleeves, pants, a wide-brimmed hat, and sunglasses whenever you are outside. Heart disease, diabetes, and high blood pressure  High blood pressure causes heart disease and increases the risk of stroke. High blood pressure is more likely  to develop in:  People who have blood pressure in the high end of the normal range (130-139/85-89 mm Hg).  People who are overweight or obese.  People who are African American.  If you are 39-70 years of age, have your blood pressure checked every 3-5 years. If you are 65 years of age or older, have your blood pressure checked every year. You should have your blood pressure measured twice-once when you are at a hospital or clinic, and once when you are not at a hospital or clinic. Record the average of the two measurements. To check your blood pressure when you are not at a hospital or clinic, you can use:  An automated blood pressure machine at a pharmacy.  A home blood pressure monitor.  If you are between 44 years and 40 years old, ask your health care provider if you should take aspirin to prevent strokes.  Have regular diabetes screenings. This involves taking a blood sample to check your  fasting blood sugar level.  If you are at a normal weight and have a low risk for diabetes, have this test once every three years after 63 years of age.  If you are overweight and have a high risk for diabetes, consider being tested at a younger age or more often. Preventing infection Hepatitis B  If you have a higher risk for hepatitis B, you should be screened for this virus. You are considered at high risk for hepatitis B if:  You were born in a country where hepatitis B is common. Ask your health care provider which countries are considered high risk.  Your parents were born in a high-risk country, and you have not been immunized against hepatitis B (hepatitis B vaccine).  You have HIV or AIDS.  You use needles to inject street drugs.  You live with someone who has hepatitis B.  You have had sex with someone who has hepatitis B.  You get hemodialysis treatment.  You take certain medicines for conditions, including cancer, organ transplantation, and autoimmune conditions. Hepatitis C  Blood testing is recommended for:  Everyone born from [birth year] through 1965.  Anyone with known risk factors for hepatitis C. Sexually transmitted infections (STIs)  You should be screened for sexually transmitted infections (STIs) including gonorrhea and chlamydia if:  You are sexually active and are younger than 63 years of age.  You are older than 63 years of age and your health care provider tells you that you are at risk for this type of infection.  Your sexual activity has changed since you were last screened and you are at an increased risk for chlamydia or gonorrhea. Ask your health care provider if you are at risk.  If you do not have HIV, but are at risk, it may be recommended that you take a prescription medicine daily to prevent HIV infection. This is called pre-exposure prophylaxis (PrEP). You are considered at risk if:  You are sexually active and do not regularly use condoms or  know the HIV status of your partner(s).  You take drugs by injection.  You are sexually active with a partner who has HIV. Talk with your health care provider about whether you are at high risk of being infected with HIV. If you choose to begin PrEP, you should first be tested for HIV. You should then be tested every 3 months for as long as you are taking PrEP. Pregnancy  If you are premenopausal and you may become pregnant, ask your  health care provider about preconception counseling.  If you may become pregnant, take 400 to 800 micrograms (mcg) of folic acid every day.  If you want to prevent pregnancy, talk to your health care provider about birth control (contraception). Osteoporosis and menopause  Osteoporosis is a disease in which the bones lose minerals and strength with aging. This can result in serious bone fractures. Your risk for osteoporosis can be identified using a bone density scan.  If you are 12 years of age or older, or if you are at risk for osteoporosis and fractures, ask your health care provider if you should be screened.  Ask your health care provider whether you should take a calcium or vitamin D supplement to lower your risk for osteoporosis.  Menopause may have certain physical symptoms and risks.  Hormone replacement therapy may reduce some of these symptoms and risks. Talk to your health care provider about whether hormone replacement therapy is right for you. Follow these instructions at home:  Schedule regular health, dental, and eye exams.  Stay current with your immunizations.  Do not use any tobacco products including cigarettes, chewing tobacco, or electronic cigarettes.  If you are pregnant, do not drink alcohol.  If you are breastfeeding, limit how much and how often you drink alcohol.  Limit alcohol intake to no more than 1 drink per day for nonpregnant women. One drink equals 12 ounces of beer, 5 ounces of wine, or 1 ounces of hard  liquor.  Do not use street drugs.  Do not share needles.  Ask your health care provider for help if you need support or information about quitting drugs.  Tell your health care provider if you often feel depressed.  Tell your health care provider if you have ever been abused or do not feel safe at home. This information is not intended to replace advice given to you by your health care provider. Make sure you discuss any questions you have with your health care provider. Document Released: 02/08/2011 Document Revised: 01/01/2016 Document Reviewed: 04/29/2015  2017 [employer]

## 2016-09-13 NOTE — Progress Notes (Signed)
Subjective:    Patient ID: Linda Schmitt, female    DOB: [birth year], 61 y.o.   MRN: [id number]  CC: [name] is a 61 y.o. female who presents today for physical exam.    HPI:    Complains of headaches for past several months, improved this week. Candis Musa if 'stress a trigger'. No headache today. HA typically on right side and back of head. Describes as Dull. occasional sharp pain, which lasts for a few seconds and resolves on its own. No changes in vision. Doesn't wake up with HA and not awokened from sleep with HA. HA not positional  Ibuprofen helps 'somewhat.' Using decaf coffee, one per day. Regular alcohol use. Eye doctor last year and no problems with vision; does have a 'cross eye.' HA feel similar to HA in past.   Depression- lost of son and fiance. Sees her minister. Had a thought last year when fiance died about suicide. No gun or plan. No thoughts of hurting herself or anyone else. Loves her 2 grandchildren so much and wants to 'be there for them' ' lives for them.'.     Colorectal Cancer Screening: UTD , 6 years ago. Normal per patient. Due to come back in 10 years per patient.  Breast Cancer Screening: Mammogram UTD, 01/2016 Cervical Cancer Screening: 2013, due Bone Health screening/DEXA for 65+: No increased fracture risk. Defer screening at this time. Lung Cancer Screening: Has 30 year history.        Tetanus - UTD        Pneumococcal - Candidate for. Declines    Flu- declines Hepatitis C screening - Candidate for HIV Screening- Candidate for  Labs: Screening labs today. Exercise: none Alcohol use: regularly Smoking/tobacco use: smoker.  Regular dental exams: In need of dental exam. Wears seat belt: Yes.  HISTORY:  History reviewed. No pertinent past medical history.  Past Surgical History:  Procedure Laterality Date  . TUBAL LIGATION     Family History  Problem Relation Age of Onset  . Cancer Mother     lymphoma  . Stroke Sister   . Cancer  Brother     lung  . Cancer Sister     lung?  . Breast cancer Cousin 31    maternal      ALLERGIES: Patient has no known allergies.  No current outpatient prescriptions on file prior to visit.   No current facility-administered medications on file prior to visit.     Social History  Substance Use Topics  . Smoking status: Current Every Day Smoker    Packs/day: 0.30    Years: 10.00    Types: Cigarettes  . Smokeless tobacco: Never Used  . Alcohol use Yes     Comment: 2-3 glasses of wine per week; 2-3 beers per week    Review of Systems  Constitutional: Negative for chills, fever and unexpected weight change.  HENT: Negative for congestion.   Respiratory: Negative for cough.   Cardiovascular: Negative for chest pain, palpitations and leg swelling.  Gastrointestinal: Negative for nausea and vomiting.  Musculoskeletal: Negative for arthralgias and myalgias.  Skin: Negative for rash.  Neurological: Negative for headaches.  Hematological: Negative for adenopathy.  Psychiatric/Behavioral: Negative for confusion.      Objective:    BP 130/78   Pulse 90   Temp 98.2 F (36.8 C) (Oral)   Ht 5\' 3"  (1.6 m)   Wt 224 lb (101.6 kg)   SpO2 98%   BMI 39.68 kg/m  BP Readings from Last 3 Encounters:  09/13/16 130/78  05/24/16 (!) 114/52  01/14/15 106/68   Wt Readings from Last 3 Encounters:  09/13/16 224 lb (101.6 kg)  05/23/16 190 lb (86.2 kg)  01/14/15 229 lb 8 oz (104.1 kg)    Physical Exam  Constitutional: She appears well-developed and well-nourished.  Eyes: Conjunctivae are normal.  Neck: No thyroid mass and no thyromegaly present.  Cardiovascular: Normal rate, regular rhythm, normal heart sounds and normal pulses.   Pulmonary/Chest: Effort normal and breath sounds normal. She has no wheezes. She has no rhonchi. She has no rales. Right breast exhibits no inverted nipple, no mass, no nipple discharge, no skin change and no tenderness. Left breast exhibits no  inverted nipple, no mass, no nipple discharge, no skin change and no tenderness. Breasts are symmetrical.  No masses or asymmetry appreciated during CBE.  Genitourinary: Uterus is not enlarged, not fixed and not tender. Cervix exhibits no motion tenderness, no discharge and no friability. Right adnexum displays no mass, no tenderness and no fullness. Left adnexum displays no mass, no tenderness and no fullness.  Genitourinary Comments: Pap performed. No CMT. Unable to appreciated ovaries.  Lymphadenopathy:       Head (right side): No submental, no submandibular, no tonsillar, no preauricular, no posterior auricular and no occipital adenopathy present.       Head (left side): No submental, no submandibular, no tonsillar, no preauricular, no posterior auricular and no occipital adenopathy present.       Right cervical: No superficial cervical, no deep cervical and no posterior cervical adenopathy present.      Left cervical: No superficial cervical, no deep cervical and no posterior cervical adenopathy present.    She has no axillary adenopathy.       Right axillary: No pectoral and no lateral adenopathy present.       Left axillary: No pectoral and no lateral adenopathy present. Neurological: She is alert.  Skin: Skin is warm and dry.  Psychiatric: She has a normal mood and affect. Her speech is normal and behavior is normal. Thought content normal.  Vitals reviewed.      Assessment & Plan:   Problem List Items Addressed This Visit      Other   Routine general medical examination at a health care facility - Primary    Up-to-date on colonoscopy. Mammogram also up-to-date. Pap performed today. Clinical breast exam performed today. No increased fracture risk, patient I jointly agreed to defer DEXA scan at this time. Has a 30 year smoking history, CT lung screening appropriate. Referral placed. Up-to-date on tetanus vaccine. She politely declines pneumonia or flu vaccine. Screening labs,  including hepatitis C and HIV which patient consented, will be done when patient's fasting. Not interested in smoking cessation at this time. Encouraged regular exercise and moderation of alcohol.       Relevant Orders   CT CHEST LUNG CANCER SCREENING LOW DOSE WO CONTRAST   CBC with Differential/Platelet   Hemoglobin A1c   Hepatitis C antibody   HIV antibody   Lipid panel   Comprehensive metabolic panel   TSH   VITAMIN D 25 Hydroxy (Vit-D Deficiency, Fractures)   Cytology - PAP   Depression, recurrent (HCC)    Since lost  fianc and only son, couple years ago patient struggled with depression. We discussed extensively her thought a year ago of suicide. She has no plan or gun. She was adamant that she has no SI at this time. She wants  to 'live for her grandchildren'. She politely declines any medication or counseling at this time. We will continue to discuss at future visits. Follow up one month.       Headache    Improved. No alarm symptoms. Patient and I jointly agreed to keep headache diary as appropriate next step to further understand pattern. She will follow up in one month.           Ms. Kyriacou does not currently have medications on file.   No orders of the defined types were placed in this encounter.   Return precautions given.   Risks, benefits, and alternatives of the medications and treatment plan prescribed today were discussed, and patient expressed understanding.   Education regarding symptom management and diagnosis given to patient on AVS.   Continue to follow with [name], FNP for routine health maintenance.   [place] and I agreed with plan.   [name], FNP   Total of 25 minutes spent with patient, greater than 50% of which was spent in discussion of depression, loss of loved ones.

## 2016-09-14 ENCOUNTER — Telehealth: Payer: Self-pay | Admitting: Family

## 2016-09-14 ENCOUNTER — Encounter: Payer: Self-pay | Admitting: Family

## 2016-09-14 DIAGNOSIS — R519 Headache, unspecified: Secondary | ICD-10-CM | POA: Insufficient documentation

## 2016-09-14 DIAGNOSIS — R51 Headache: Secondary | ICD-10-CM

## 2016-09-14 DIAGNOSIS — F339 Major depressive disorder, recurrent, unspecified: Secondary | ICD-10-CM | POA: Insufficient documentation

## 2016-09-14 NOTE — Assessment & Plan Note (Addendum)
Improved. No alarm symptoms. Patient and I jointly agreed to keep headache diary as appropriate next step to further understand pattern. She will follow up in one month.

## 2016-09-14 NOTE — Assessment & Plan Note (Addendum)
Since lost  fianc and only son, couple years ago patient struggled with depression. We discussed extensively her thought a year ago of suicide. She has no plan or gun. She was adamant that she has no SI at this time. She wants to 'live for her grandchildren'. She politely declines any medication or counseling at this time. We will continue to discuss at future visits. Follow up one month.

## 2016-09-14 NOTE — Telephone Encounter (Signed)
Call pt  How is her Headache? ?  Better? Worse?  If not improving, I would like to order an MRI brain for further evaluation.

## 2016-09-14 NOTE — Assessment & Plan Note (Signed)
Up-to-date on colonoscopy. Mammogram also up-to-date. Pap performed today. Clinical breast exam performed today. No increased fracture risk, patient I jointly agreed to defer DEXA scan at this time. Has a 30 year smoking history, CT lung screening appropriate. Referral placed. Up-to-date on tetanus vaccine. She politely declines pneumonia or flu vaccine. Screening labs, including hepatitis C and HIV which patient consented, will be done when patient's fasting. Not interested in smoking cessation at this time. Encouraged regular exercise and moderation of alcohol.

## 2016-09-15 LAB — CYTOLOGY - PAP
Diagnosis: NEGATIVE
HPV: NOT DETECTED

## 2016-09-15 NOTE — Telephone Encounter (Signed)
Patient stated her headache is very good today. The headache is not worse and not completely gone as of yet but patient also stated that today was a lot better day today.

## 2016-09-16 ENCOUNTER — Telehealth: Payer: Self-pay | Admitting: *Deleted

## 2016-09-16 DIAGNOSIS — Z87891 Personal history of nicotine dependence: Secondary | ICD-10-CM

## 2016-09-16 LAB — HM PAP SMEAR: HM Pap smear: NEGATIVE

## 2016-09-16 NOTE — Telephone Encounter (Signed)
Received referral for low dose lung cancer screening CT scan. Voicemail left at phone number listed in EMR for patient to call me back to facilitate scheduling scan.  

## 2016-09-16 NOTE — Telephone Encounter (Signed)
Received referral for initial lung cancer screening scan. Contacted patient and obtained smoking history,(current, 30 pack year) as well as answering questions related to screening process. Patient denies signs of lung cancer such as weight loss or hemoptysis. Patient denies comorbidity that would prevent curative treatment if lung cancer were found. Patient is tentatively scheduled for shared decision making visit and CT scan on 09/21/16 at 1:30pm, pending insurance approval from business office.

## 2016-09-16 NOTE — Telephone Encounter (Signed)
error 

## 2016-09-21 ENCOUNTER — Ambulatory Visit
Admission: RE | Admit: 2016-09-21 | Discharge: 2016-09-21 | Disposition: A | Discharge status: Home / Self Care | Payer: BLUE CROSS/BLUE SHIELD | Source: Ambulatory Visit | Attending: Oncology | Admitting: Oncology

## 2016-09-21 ENCOUNTER — Inpatient Hospital Stay: Payer: BLUE CROSS/BLUE SHIELD | Attending: Oncology | Admitting: Oncology

## 2016-09-21 ENCOUNTER — Encounter: Payer: Self-pay | Admitting: Oncology

## 2016-09-21 DIAGNOSIS — Z87891 Personal history of nicotine dependence: Secondary | ICD-10-CM | POA: Diagnosis present

## 2016-09-21 DIAGNOSIS — I700 Atherosclerosis of aorta: Secondary | ICD-10-CM | POA: Diagnosis not present

## 2016-09-21 DIAGNOSIS — Z122 Encounter for screening for malignant neoplasm of respiratory organs: Secondary | ICD-10-CM

## 2016-09-21 NOTE — Progress Notes (Signed)
In accordance with CMS guidelines, patient has met eligibility criteria including age, absence of signs or symptoms of lung cancer.  Social History  Substance Use Topics  . Smoking status: Current Every Day Smoker    Packs/day: 0.75    Years: 40.00    Types: Cigarettes  . Smokeless tobacco: Never Used  . Alcohol use Yes     Comment: 2-3 glasses of wine per week; 2-3 beers per week     A shared decision-making session was conducted prior to the performance of CT scan. This includes one or more decision aids, includes benefits and harms of screening, follow-up diagnostic testing, over-diagnosis, false positive rate, and total radiation exposure.  Counseling on the importance of adherence to annual lung cancer LDCT screening, impact of co-morbidities, and ability or willingness to undergo diagnosis and treatment is imperative for compliance of the program.  Counseling on the importance of continued smoking cessation for former smokers; the importance of smoking cessation for current smokers, and information about tobacco cessation interventions have been given to patient including [place] and [facility] quit [place] programs.  Written order for lung cancer screening with LDCT has been given to the patient and any and all questions have been answered to the best of my abilities.   Yearly follow up will be coordinated by Burgess Estelle, Thoracic Navigator.

## 2016-09-24 ENCOUNTER — Telehealth: Payer: Self-pay | Admitting: *Deleted

## 2016-09-24 NOTE — Telephone Encounter (Signed)
Notified patient of LDCT lung cancer screening results with recommendation for 12 month follow up imaging. Also notified of incidental finding noted below and encouraged to discuss with PCP. This note will be sent to PCP via Epic. Patient verbalizes understanding.   IMPRESSION: 1. Lung-RADS Category 1, negative. Continue annual screening with low-dose chest CT without contrast in 12 months. 2.  Aortic atherosclerosis.

## 2016-09-27 NOTE — Telephone Encounter (Signed)
Call pt-  Cholesterol seen on ct chest  Please advise appt to dicuss starting medication  She also is due for her future cpe labs which can be done that day as well

## 2016-09-28 NOTE — Telephone Encounter (Signed)
Pt called back returning your call. Thank you!  Call pt @ [phone]

## 2016-09-28 NOTE — Telephone Encounter (Signed)
Left message for patient to return call back.  

## 2016-09-29 NOTE — Telephone Encounter (Signed)
Patient has already been notified. Also patient has made appointment [phone]

## 2016-09-29 NOTE — Telephone Encounter (Signed)
Left message for patient to return call back.  

## 2016-09-29 NOTE — Telephone Encounter (Signed)
Patient has made appointment

## 2016-09-29 NOTE — Telephone Encounter (Signed)
Lab appt  Needs ov to discuss cholesterol

## 2016-10-11 ENCOUNTER — Ambulatory Visit (INDEPENDENT_AMBULATORY_CARE_PROVIDER_SITE_OTHER): Payer: BLUE CROSS/BLUE SHIELD | Admitting: Family

## 2016-10-11 ENCOUNTER — Ambulatory Visit: Payer: BLUE CROSS/BLUE SHIELD | Admitting: Family

## 2016-10-11 ENCOUNTER — Other Ambulatory Visit: Payer: BLUE CROSS/BLUE SHIELD

## 2016-10-11 ENCOUNTER — Encounter: Payer: Self-pay | Admitting: Family

## 2016-10-11 VITALS — BP 124/86 | HR 79 | Temp 98.4°F | Ht 64.0 in | Wt 222.8 lb

## 2016-10-11 DIAGNOSIS — F339 Major depressive disorder, recurrent, unspecified: Secondary | ICD-10-CM

## 2016-10-11 DIAGNOSIS — Z0000 Encounter for general adult medical examination without abnormal findings: Secondary | ICD-10-CM

## 2016-10-11 DIAGNOSIS — G8929 Other chronic pain: Secondary | ICD-10-CM

## 2016-10-11 DIAGNOSIS — R519 Headache, unspecified: Secondary | ICD-10-CM

## 2016-10-11 DIAGNOSIS — R51 Headache: Secondary | ICD-10-CM | POA: Diagnosis not present

## 2016-10-11 DIAGNOSIS — E785 Hyperlipidemia, unspecified: Secondary | ICD-10-CM

## 2016-10-11 LAB — HEMOGLOBIN A1C: Hgb A1c MFr Bld: 6.2 % (ref 4.6–6.5)

## 2016-10-11 LAB — COMPREHENSIVE METABOLIC PANEL
ALT: 16 U/L (ref 0–35)
AST: 13 U/L (ref 0–37)
Albumin: 4.1 g/dL (ref 3.5–5.2)
Alkaline Phosphatase: 48 U/L (ref 39–117)
BUN: 11 mg/dL (ref 6–23)
CO2: 26 mEq/L (ref 19–32)
Calcium: 9.2 mg/dL (ref 8.4–10.5)
Chloride: 111 mEq/L (ref 96–112)
Creatinine, Ser: 0.84 mg/dL (ref 0.40–1.20)
GFR: 88.11 mL/min (ref >60.00)
Glucose, Bld: 117 mg/dL — ABNORMAL HIGH (ref 70–99)
Potassium: 4.1 mEq/L (ref 3.5–5.1)
Sodium: 143 mEq/L (ref 135–145)
Total Bilirubin: 0.4 mg/dL (ref 0.2–1.2)
Total Protein: 6.3 g/dL (ref 6.0–8.3)

## 2016-10-11 LAB — CBC WITH DIFFERENTIAL/PLATELET
Basophils Absolute: 0 10*3/uL (ref 0.0–0.1)
Basophils Relative: 0.4 % (ref 0.0–3.0)
Eosinophils Absolute: 0.1 10*3/uL (ref 0.0–0.7)
Eosinophils Relative: 2 % (ref 0.0–5.0)
HCT: 40.5 % (ref 36.0–46.0)
Hemoglobin: 13.4 g/dL (ref 12.0–15.0)
Lymphocytes Relative: 29.1 % (ref 12.0–46.0)
Lymphs Abs: 2 10*3/uL (ref 0.7–4.0)
MCHC: 33.2 g/dL (ref 30.0–36.0)
MCV: 90.3 fl (ref 78.0–100.0)
Monocytes Absolute: 0.7 10*3/uL (ref 0.1–1.0)
Monocytes Relative: 9.8 % (ref 3.0–12.0)
Neutro Abs: 4.1 10*3/uL (ref 1.4–7.7)
Neutrophils Relative %: 58.7 % (ref 43.0–77.0)
Platelets: 159 10*3/uL (ref 150.0–400.0)
RBC: 4.48 Mil/uL (ref 3.87–5.11)
RDW: 14.8 % (ref 11.5–15.5)
WBC: 6.9 10*3/uL (ref 4.0–10.5)

## 2016-10-11 LAB — LIPID PANEL
Cholesterol: 178 mg/dL (ref 0–200)
HDL: 59 mg/dL (ref >39.00)
LDL Cholesterol: 106 mg/dL — ABNORMAL HIGH (ref 0–99)
NonHDL: 119.04
Total CHOL/HDL Ratio: 3
Triglycerides: 67 mg/dL (ref 0.0–149.0)
VLDL: 13.4 mg/dL (ref 0.0–40.0)

## 2016-10-11 LAB — VITAMIN D 25 HYDROXY (VIT D DEFICIENCY, FRACTURES): VITD: 27.77 ng/mL — ABNORMAL LOW (ref 30.00–100.00)

## 2016-10-11 LAB — TSH: TSH: 1.22 u[IU]/mL (ref 0.35–4.50)

## 2016-10-11 MED ORDER — PRAVASTATIN SODIUM 40 MG PO TABS: 40.0000 mg | ORAL_TABLET | Freq: Every day | ORAL | 2 refills | Status: DC

## 2016-10-11 NOTE — Assessment & Plan Note (Signed)
Resolved, will continue to monitor

## 2016-10-11 NOTE — Progress Notes (Signed)
Pre visit review using our clinic review tool, if applicable. No additional management support is needed unless otherwise documented below in the visit note. 

## 2016-10-11 NOTE — Assessment & Plan Note (Signed)
Improved. Will continue to follow. 

## 2016-10-11 NOTE — Patient Instructions (Signed)
Labs today  Start Pravachol.

## 2016-10-11 NOTE — Assessment & Plan Note (Signed)
Cholesterol seen on CT scan. Start pravachol. Pending lipid panel.

## 2016-10-11 NOTE — Progress Notes (Signed)
Subjective:    Patient ID: Linda Schmitt, female    DOB: 06/04/1956, 61 y.o.   MRN: [id number]  CC: Linda Schmitt is a 61 y.o. female who presents today for follow up.   HPI: CT scan showed atherosclerosis.   HA's improved. Not taking any medication for them. Thinks r/t stress  Depression- Mood has also improved. Not as tearful.     HISTORY:  No past medical history on file. Past Surgical History:  Procedure Laterality Date  . TUBAL LIGATION     Family History  Problem Relation Age of Onset  . Cancer Mother     lymphoma  . Stroke Sister   . Cancer Brother     lung  . Cancer Sister     lung?  . Breast cancer Cousin 45    maternal    Allergies: Patient has no known allergies. No current outpatient prescriptions on file prior to visit.   No current facility-administered medications on file prior to visit.     Social History  Substance Use Topics  . Smoking status: Current Every Day Smoker    Packs/day: 0.75    Years: 40.00    Types: Cigarettes  . Smokeless tobacco: Never Used  . Alcohol use Yes     Comment: 2-3 glasses of wine per week; 2-3 beers per week    Review of Systems  Constitutional: Negative for chills and fever.  Respiratory: Negative for cough.   Cardiovascular: Negative for chest pain and palpitations.  Gastrointestinal: Negative for nausea and vomiting.      Objective:    BP 124/86 (BP Location: Left Arm, Patient Position: Sitting, Cuff Size: Large)   Pulse 79   Temp 98.4 F (36.9 C) (Oral)   Ht 5\' 4"  (1.626 m)   Wt 222 lb 12.8 oz (101.1 kg)   SpO2 98%   BMI 38.24 kg/m  BP Readings from Last 3 Encounters:  10/11/16 124/86  09/13/16 130/78  05/24/16 (!) 114/52   Wt Readings from Last 3 Encounters:  10/11/16 222 lb 12.8 oz (101.1 kg)  09/21/16 224 lb (101.6 kg)  09/13/16 224 lb (101.6 kg)    Physical Exam  Constitutional: She appears well-developed and well-nourished.  Eyes: Conjunctivae are normal.  Cardiovascular:  Normal rate, regular rhythm, normal heart sounds and normal pulses.   Pulmonary/Chest: Effort normal and breath sounds normal. She has no wheezes. She has no rhonchi. She has no rales.  Neurological: She is alert.  Skin: Skin is warm and dry.  Psychiatric: She has a normal mood and affect. Her speech is normal and behavior is normal. Thought content normal.  Vitals reviewed.      Assessment & Plan:   Problem List Items Addressed This Visit      Other   Routine general medical examination at a health care facility   Depression, recurrent ([place])    Improved. Will continue to follow      Headache    Resolved, will continue to monitor      Hyperlipidemia - Primary    Cholesterol seen on CT scan. Start pravachol. Pending lipid panel.      Relevant Medications   pravastatin (PRAVACHOL) 40 MG tablet       I am having Ms. Blixt start on pravastatin.   Meds ordered this encounter  Medications  . pravastatin (PRAVACHOL) 40 MG tablet    Sig: Take 1 tablet (40 mg total) by mouth daily.    Dispense:  90 tablet  Refill:  2    Order Specific Question:   Supervising Provider    Answer:   Crecencio Mc [[id number]]    Return precautions given.   Risks, benefits, and alternatives of the medications and treatment plan prescribed today were discussed, and patient expressed understanding.   Education regarding symptom management and diagnosis given to patient on AVS.  Continue to follow with [name], FNP for routine health maintenance.   [place] and I agreed with plan.   [name], FNP

## 2016-10-12 LAB — HIV ANTIBODY (ROUTINE TESTING W REFLEX): HIV 1&2 Ab, 4th Generation: NONREACTIVE

## 2016-10-12 LAB — HEPATITIS C ANTIBODY: HCV Ab: NEGATIVE

## 2017-02-10 ENCOUNTER — Telehealth: Payer: Self-pay | Admitting: Family

## 2017-02-10 DIAGNOSIS — Z1239 Encounter for other screening for malignant neoplasm of breast: Secondary | ICD-10-CM

## 2017-02-10 NOTE — Telephone Encounter (Signed)
Please advise 

## 2017-02-10 NOTE — Telephone Encounter (Signed)
Pt lvm stating that she tried to schedule her mammogram at [facility] but was told that her doctors office needs to schedule this.  She did not state that she was having breast problems. She just needs a screening mammogram, she should of been able to schedule this appointment. Please enter order for screening mammogram. Pt cb [phone]

## 2017-02-14 NOTE — Telephone Encounter (Signed)
Call pt  Order placed  Give her info to contact [place] so she can schedeule

## 2017-02-14 NOTE — Telephone Encounter (Signed)
Phone number in first phone note is incorrect. Pt notified of info.

## 2017-02-14 NOTE — Telephone Encounter (Signed)
Pt also made f/u appt for 02/22/17 @ 3pm.

## 2017-02-22 ENCOUNTER — Encounter: Payer: Self-pay | Admitting: Family

## 2017-02-22 ENCOUNTER — Ambulatory Visit (INDEPENDENT_AMBULATORY_CARE_PROVIDER_SITE_OTHER): Payer: BLUE CROSS/BLUE SHIELD | Admitting: Family

## 2017-02-22 DIAGNOSIS — E785 Hyperlipidemia, unspecified: Secondary | ICD-10-CM | POA: Diagnosis not present

## 2017-02-22 DIAGNOSIS — F339 Major depressive disorder, recurrent, unspecified: Secondary | ICD-10-CM

## 2017-02-22 NOTE — Progress Notes (Signed)
Pre-visit discussion using our clinic review tool. No additional management support is needed unless otherwise documented below in the visit note.  

## 2017-02-22 NOTE — Patient Instructions (Signed)
Pleasure seeing you  You are doing awesome!!

## 2017-02-22 NOTE — Progress Notes (Signed)
Subjective:    Patient ID: Linda Schmitt, female    DOB: 25-Aug-1955, 61 y.o.   MRN: [id number]  CC: Linda Schmitt is a 61 y.o. female who presents today for follow up.   HPI: Depression- still has days where crying. No thoughts of hurting herself or anyone else. Feels like coping well and declines any medication today.  HLD- compliant with medication    HISTORY:  No past medical history on file. Past Surgical History:  Procedure Laterality Date  . TUBAL LIGATION     Family History  Problem Relation Age of Onset  . Cancer Mother        lymphoma  . Stroke Sister   . Cancer Brother        lung  . Cancer Sister        lung?  . Breast cancer Cousin 45       maternal    Allergies: Patient has no known allergies. Current Outpatient Prescriptions on File Prior to Visit  Medication Sig Dispense Refill  . pravastatin (PRAVACHOL) 40 MG tablet Take 1 tablet (40 mg total) by mouth daily. 90 tablet 2   No current facility-administered medications on file prior to visit.     Social History  Substance Use Topics  . Smoking status: Current Every Day Smoker    Packs/day: 0.75    Years: 40.00    Types: Cigarettes  . Smokeless tobacco: Never Used  . Alcohol use Yes     Comment: 2-3 glasses of wine per week; 2-3 beers per week    Review of Systems  Constitutional: Negative for chills and fever.  Respiratory: Negative for cough.   Cardiovascular: Negative for chest pain and palpitations.  Gastrointestinal: Negative for nausea and vomiting.  Psychiatric/Behavioral: The patient is not nervous/anxious.       Objective:    BP 122/84 (BP Location: Left Arm, Patient Position: Sitting, Cuff Size: Normal)   Pulse 81   Temp 98.2 F (36.8 C) (Oral)   Resp 12   Ht 5\' 4"  (1.626 m)   Wt 219 lb (99.3 kg)   SpO2 96%   BMI 37.59 kg/m  BP Readings from Last 3 Encounters:  02/22/17 122/84  10/11/16 124/86  09/13/16 130/78   Wt Readings from Last 3 Encounters:  02/22/17  219 lb (99.3 kg)  10/11/16 222 lb 12.8 oz (101.1 kg)  09/21/16 224 lb (101.6 kg)    Physical Exam  Constitutional: She appears well-developed and well-nourished.  Eyes: Conjunctivae are normal.  Cardiovascular: Normal rate, regular rhythm, normal heart sounds and normal pulses.   Pulmonary/Chest: Effort normal and breath sounds normal. She has no wheezes. She has no rhonchi. She has no rales.  Neurological: She is alert.  Skin: Skin is warm and dry.  Psychiatric: She has a normal mood and affect. Her speech is normal and behavior is normal. Thought content normal.  Vitals reviewed.      Assessment & Plan:   Problem List Items Addressed This Visit      Other   Depression, recurrent ([place])    Doing well at this time. Will continue to monitor.       Hyperlipidemia    Stable. Will continue regimen          I am having Ms. Rothgeb maintain her pravastatin.   No orders of the defined types were placed in this encounter.   Return precautions given.   Risks, benefits, and alternatives of the medications and treatment plan  prescribed today were discussed, and patient expressed understanding.   Education regarding symptom management and diagnosis given to patient on AVS.  Continue to follow with Burnard Hawthorne, FNP for routine health maintenance.   [place] and I agreed with plan.   [name], FNP

## 2017-02-23 ENCOUNTER — Encounter: Payer: Self-pay | Admitting: Family

## 2017-02-23 NOTE — Assessment & Plan Note (Signed)
Doing well at this time. Will continue to monitor.

## 2017-02-23 NOTE — Assessment & Plan Note (Signed)
Stable. Will continue regimen

## 2017-03-03 ENCOUNTER — Ambulatory Visit
Admission: RE | Admit: 2017-03-03 | Discharge: 2017-03-03 | Disposition: A | Discharge status: Home / Self Care | Payer: BLUE CROSS/BLUE SHIELD | Source: Ambulatory Visit | Attending: Family | Admitting: Family

## 2017-03-03 DIAGNOSIS — Z1231 Encounter for screening mammogram for malignant neoplasm of breast: Secondary | ICD-10-CM | POA: Diagnosis present

## 2017-03-03 DIAGNOSIS — Z1239 Encounter for other screening for malignant neoplasm of breast: Secondary | ICD-10-CM

## 2017-07-06 ENCOUNTER — Ambulatory Visit: Payer: BLUE CROSS/BLUE SHIELD | Admitting: Internal Medicine

## 2017-07-06 ENCOUNTER — Encounter: Payer: Self-pay | Admitting: Internal Medicine

## 2017-07-06 VITALS — BP 118/80 | HR 71 | Temp 98.3°F | Resp 16 | Ht 64.0 in | Wt 222.2 lb

## 2017-07-06 DIAGNOSIS — M67431 Ganglion, right wrist: Secondary | ICD-10-CM | POA: Insufficient documentation

## 2017-07-06 DIAGNOSIS — R7303 Prediabetes: Secondary | ICD-10-CM

## 2017-07-06 DIAGNOSIS — M778 Other enthesopathies, not elsewhere classified: Secondary | ICD-10-CM

## 2017-07-06 DIAGNOSIS — F339 Major depressive disorder, recurrent, unspecified: Secondary | ICD-10-CM

## 2017-07-06 LAB — COMPREHENSIVE METABOLIC PANEL
ALT: 14 U/L (ref 0–35)
AST: 14 U/L (ref 0–37)
Albumin: 4.5 g/dL (ref 3.5–5.2)
Alkaline Phosphatase: 52 U/L (ref 39–117)
BUN: 9 mg/dL (ref 6–23)
CO2: 30 mEq/L (ref 19–32)
Calcium: 9.8 mg/dL (ref 8.4–10.5)
Chloride: 104 mEq/L (ref 96–112)
Creatinine, Ser: 0.95 mg/dL (ref 0.40–1.20)
GFR: 76.27 mL/min (ref >60.00)
Glucose, Bld: 106 mg/dL — ABNORMAL HIGH (ref 70–99)
Potassium: 4.1 mEq/L (ref 3.5–5.1)
Sodium: 140 mEq/L (ref 135–145)
Total Bilirubin: 0.6 mg/dL (ref 0.2–1.2)
Total Protein: 7.3 g/dL (ref 6.0–8.3)

## 2017-07-06 LAB — HEMOGLOBIN A1C: Hgb A1c MFr Bld: 6.1 % (ref 4.6–6.5)

## 2017-07-06 MED ORDER — MELOXICAM 15 MG PO TABS: 15.0000 mg | ORAL_TABLET | Freq: Every day | ORAL | 0 refills | Status: DC

## 2017-07-06 MED ORDER — PREDNISONE 10 MG PO TABS: ORAL_TABLET | ORAL | 0 refills | Status: DC

## 2017-07-06 NOTE — Assessment & Plan Note (Signed)
Prednisone taper,  Followed by once daily meloxicam.  Work duty addressed no lifting > 5 lbs for 4 weeks.

## 2017-07-06 NOTE — Progress Notes (Signed)
Subjective:  Patient ID: Linda Schmitt, female    DOB: 1955-08-31  Age: 61 y.o. MRN: [id number]  CC: The primary encounter diagnosis was Prediabetes. Diagnoses of Tendonitis of wrist, right and Depression, recurrent ([place]) were also pertinent to this visit.  HPI Linda Schmitt presents for wrist pain and swelling for the past 3 weeks,  Pain started one week ago,  And she has developed a cyst/knot at the base of the right thumb. 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  (Her packages weigh up to  60 lbs)   2) Complicated grief.  Tearful today when asked.  Lost her mother,   Son and fiance all in the last 2 years.  lives alone.  Several grandchildren nearby,  Deferred medication recommended by Joycelyn Schmid at last visit.  Denies suicidality.  3) Prediabetes : diagnosed 2 years ago.  Not exercising or following a low glycemic index diet.     Outpatient Medications Prior to Visit  Medication Sig Dispense Refill  . pravastatin (PRAVACHOL) 40 MG tablet Take 1 tablet (40 mg total) by mouth daily. 90 tablet 2   No facility-administered medications prior to visit.     Review of Systems;  Patient denies headache, fevers, malaise, unintentional weight loss, skin rash, eye pain, sinus congestion and sinus pain, sore throat, dysphagia,  hemoptysis , cough, dyspnea, wheezing, chest pain, palpitations, orthopnea, edema, abdominal pain, nausea, melena, diarrhea, constipation, flank pain, dysuria, hematuria, urinary  Frequency, nocturia, numbness, tingling, seizures,  Focal weakness, Loss of consciousness,  Tremor, insomnia, depression, anxiety, and suicidal ideation.      Objective:  BP 118/80 (BP Location: Left Arm, Patient Position: Sitting, Cuff Size: Large)   Pulse 71   Temp 98.3 F (36.8 C) (Oral)   Resp 16   Ht 5\' 4"  (1.626 m)   Wt 222 lb 3.2 oz (100.8 kg)   SpO2 96%   BMI 38.14 kg/m   BP Readings  from Last 3 Encounters:  07/06/17 118/80  02/22/17 122/84  10/11/16 124/86    Wt Readings from Last 3 Encounters:  07/06/17 222 lb 3.2 oz (100.8 kg)  02/22/17 219 lb (99.3 kg)  10/11/16 222 lb 12.8 oz (101.1 kg)    General appearance: alert, cooperative and appears stated age Lungs: clear to auscultation bilaterally Heart: regular rate and rhythm, S1, S2 normal, no murmur, click, rub or gallop Abdomen: soft, non-tender; bowel sounds normal; no masses,  no organomegaly Pulses: 2+ and symmetric Skin: Skin color, texture, turgor normal. No rashes or lesions MSK: right  hand with cystic swelling base of extensor tendon of thumb.  ROM normal   Lymph nodes: Cervical, supraclavicular, and axillary nodes normal.  Lab Results  Component Value Date   HGBA1C 6.1 07/06/2017   HGBA1C 6.2 10/11/2016   HGBA1C 6.1 01/14/2015    Lab Results  Component Value Date   CREATININE 0.95 07/06/2017   CREATININE 0.84 10/11/2016   CREATININE 0.98 05/23/2016    Lab Results  Component Value Date   WBC 6.9 10/11/2016   HGB 13.4 10/11/2016   HCT 40.5 10/11/2016   PLT 159.0 10/11/2016   GLUCOSE 106 (H) 07/06/2017   CHOL 178 10/11/2016   TRIG 67.0 10/11/2016   HDL 59.00 10/11/2016   LDLCALC 106 (H) 10/11/2016   ALT 14 07/06/2017   AST 14 07/06/2017   NA 140 07/06/2017   K 4.1 07/06/2017  CL 104 07/06/2017   CREATININE 0.95 07/06/2017   BUN 9 07/06/2017   CO2 30 07/06/2017   TSH 1.22 10/11/2016   HGBA1C 6.1 07/06/2017   MICROALBUR 1.0 01/14/2015    Mm Screening Breast Tomo Bilateral  Result Date: 03/03/2017 CLINICAL DATA:  Screening. EXAM: 2D DIGITAL SCREENING BILATERAL MAMMOGRAM WITH CAD AND ADJUNCT TOMO COMPARISON:  Previous exam(s). ACR Breast Density Category b: There are scattered areas of fibroglandular density. FINDINGS: There are no findings suspicious for malignancy. Images were processed with CAD. IMPRESSION: No mammographic evidence of malignancy. A result letter of this  screening mammogram will be mailed directly to the patient. RECOMMENDATION: Screening mammogram in one year. (Code:SM-B-01Y) BI-RADS CATEGORY  1: Negative. Electronically Signed   By: Ammie Ferrier M.D.   On: 03/03/2017 14:53    Assessment & Plan:   Problem List Items Addressed This Visit    Depression, recurrent ([place])    Secondary to multiple family member losses over the past 2 years.  A total of 25 minutes of face to face time was spent with patient  in counselling her about her grief.  She denies suicidality.    She continues to defer  grief counselling and pharmacotherapy        Prediabetes - Primary    Her A1c again elevated but not diagnostic of prediabetes . She is a smoker,  I recommend she follow a low glycemic index diet , quit smoking , and particpate regularly in an aerobic  exercise activity.  We should check an A1c in 6 months.       Relevant Orders   Comprehensive metabolic panel (Completed)   Hemoglobin A1c (Completed)   Tendonitis of wrist, right    Prednisone taper,  Followed by once daily meloxicam.  Work duty addressed no lifting > 5 lbs for 4 weeks.        A total of 40 minutes was spent with patient more than half of which was spent in counseling patient on the above mentioned issues , reviewing and explaining recent labs and imaging studies done, and coordination of care.   I am having Danny Lawless. Moyers start on predniSONE and meloxicam. I am also having her maintain her pravastatin.  Meds ordered this encounter  Medications  . predniSONE (DELTASONE) 10 MG tablet    Sig: 6 tablets on Day 1 , then reduce by 1 tablet daily until gone    Dispense:  21 tablet    Refill:  0  . meloxicam (MOBIC) 15 MG tablet    Sig: Take 1 tablet (15 mg total) by mouth daily. START AFTER FINISHING THE PREDNISONE TAPER    Dispense:  30 tablet    Refill:  0    There are no discontinued medications.  Follow-up: Return in about 3 months (around 10/06/2017) for  MARGARETT.   Crecencio Mc, MD

## 2017-07-06 NOTE — Patient Instructions (Signed)
You have tendonitis of the wrist caused by heavy lifting  No lifting anything > 5 lbs for the next 4 weeks  Prednisone taper for omne week,  Followed by one daily meloxicam for 4 weeks  DO NOT TAKE MOTRIN , ALEVE,  OR ADVIL WHILE YOU ARE TAKING PREDNISONE OR MELOXICAM OK TO ADD TYLENOL,  UP TO 2000 MG DAILY ,  FOR PAIN .  Alfonse Ras Disease Alfonse Ras disease is inflammation of the tendon on the thumb side of the wrist. Tendons are cords of tissue that connect bones to muscles. The tendons in your hand pass through a tunnel, or sheath. A slippery layer of tissue (synovium) lets the tendons move smoothly in the sheath. With de Quervain disease, the sheath swells or thickens, causing friction and pain. The condition is also called de Quervain tendinosis and de Quervain syndrome. It occurs most often in women who are 40-26 years old. What are the causes? The exact cause of de Quervain disease is not known. It may result from:  Overusing your hands, especially with repetitive motions that involve twisting your hand or using a forceful grip.  Pregnancy.  Rheumatoid disease.  What increases the risk? You may have a greater risk for de Quervain disease if you:  Are a middle-aged woman.  Are pregnant.  Have rheumatoid arthritis.  Have diabetes.  Use your hands far more than normal, especially with a tight grip or excessive twisting.  What are the signs or symptoms? Pain on the thumb side of your wrist is the main symptom of de Quervain disease. Other signs and symptoms include:  Pain that gets worse when you grasp something or turn your wrist.  Pain that extends up the forearm.  Cysts in the area of the pain.  Swelling of your wrist and hand.  A sensation of snapping in the wrist.  Trouble moving the thumb and wrist.  How is this diagnosed? Your health care provider may diagnose de Quervain disease based on your signs and symptoms. A physical exam will also be done. A  simple test Wynn [place] test) that involves pulling your thumb and wrist to see if this causes pain can help determine whether you have the condition. Sometimes you may need to have an X-ray. How is this treated? Avoiding any activity that causes pain and swelling is the best treatment. Other options include:  Wearing a splint.  Taking medicine. Anti-inflammatory medicines and corticosteroid injections may reduce inflammation and relieve pain.  Having surgery if other treatments do not work.  Follow these instructions at home:  Using ice can be helpful after doing activities that involve the sore wrist. To apply ice to the injured area: ? Put ice in a plastic bag. ? Place a towel between your skin and the bag. ? Leave the ice on for 20 minutes, 2-3 times a day.  Take medicines only as directed by your health care provider.  Wear your splint as directed. This will allow your hand to rest and heal. Contact a health care provider if:  Your pain medicine does not help.  Your pain gets worse.  You develop new symptoms. This information is not intended to replace advice given to you by your health care provider. Make sure you discuss any questions you have with your health care provider. Document Released: 04/20/2001 Document Revised: 01/01/2016 Document Reviewed: 11/28/2013 Elsevier Interactive Patient Education  [employer].

## 2017-07-09 ENCOUNTER — Encounter: Payer: Self-pay | Admitting: Internal Medicine

## 2017-07-09 DIAGNOSIS — R7303 Prediabetes: Secondary | ICD-10-CM | POA: Insufficient documentation

## 2017-07-09 NOTE — Assessment & Plan Note (Addendum)
Secondary to multiple family member losses over the past 2 years.  A total of 25 minutes of face to face time was spent with patient  in counselling her about her grief.  She denies suicidality.    She continues to defer  grief counselling and pharmacotherapy

## 2017-07-09 NOTE — Assessment & Plan Note (Addendum)
Her A1c again elevated but not diagnostic of prediabetes . She is a smoker,  I recommend she follow a low glycemic index diet , quit smoking , and particpate regularly in an aerobic  exercise activity.  We should check an A1c in 6 months.

## 2017-08-30 ENCOUNTER — Encounter: Payer: Self-pay | Admitting: Family

## 2017-09-20 ENCOUNTER — Telehealth: Payer: Self-pay | Admitting: *Deleted

## 2017-09-20 DIAGNOSIS — Z87891 Personal history of nicotine dependence: Secondary | ICD-10-CM

## 2017-09-20 DIAGNOSIS — Z122 Encounter for screening for malignant neoplasm of respiratory organs: Secondary | ICD-10-CM

## 2017-09-20 NOTE — Telephone Encounter (Signed)
Notified patient that annual lung cancer screening low dose CT scan is due currently or will be in near future. Confirmed that patient is within the age range of 55-77, and asymptomatic, (no signs or symptoms of lung cancer). Patient denies illness that would prevent curative treatment for lung cancer if found. Verified smoking history, (current, 30.75 pack year). The shared decision making visit was done 09/21/16. Patient is agreeable for CT scan being scheduled.

## 2017-09-23 ENCOUNTER — Encounter: Payer: Self-pay | Admitting: Family

## 2017-09-23 ENCOUNTER — Ambulatory Visit (INDEPENDENT_AMBULATORY_CARE_PROVIDER_SITE_OTHER): Payer: BLUE CROSS/BLUE SHIELD | Admitting: Family

## 2017-09-23 VITALS — BP 135/80 | HR 84 | Temp 98.6°F | Resp 14 | Wt 220.5 lb

## 2017-09-23 DIAGNOSIS — M778 Other enthesopathies, not elsewhere classified: Secondary | ICD-10-CM

## 2017-09-23 DIAGNOSIS — R7303 Prediabetes: Secondary | ICD-10-CM

## 2017-09-23 DIAGNOSIS — I700 Atherosclerosis of aorta: Secondary | ICD-10-CM | POA: Insufficient documentation

## 2017-09-23 MED ORDER — MELOXICAM 7.5 MG PO TABS: 7.5000 mg | ORAL_TABLET | Freq: Every day | ORAL | 1 refills | Status: DC

## 2017-09-23 NOTE — Assessment & Plan Note (Signed)
Unchanged. Symptoms c/w ganglion cyst. Doesn't appear infectious.  Advised to speak with employer about limiting lifting at work. Concerned that even if had surgery, would recur. Mobic for 6 months. Continue brace.  Ortho referral

## 2017-09-23 NOTE — Patient Instructions (Addendum)
Return in 3 months to check labs - cholesterol, pre diabetes.   Try to limit smoking Limit heaving lifting and talk to employer  May take mobic with food. We will do short course  approx 6 months to see if helps.    Carbohydrate Counting for Diabetes Mellitus, Adult Carbohydrate counting is a method for keeping track of how many carbohydrates you eat. Eating carbohydrates naturally increases the amount of sugar (glucose) in the blood. Counting how many carbohydrates you eat helps keep your blood glucose within normal limits, which helps you manage your diabetes (diabetes mellitus). It is important to know how many carbohydrates you can safely have in each meal. This is different for every person. A diet and nutrition specialist (registered dietitian) can help you make a meal plan and calculate how many carbohydrates you should have at each meal and snack. Carbohydrates are found in the following foods:  Grains, such as breads and cereals.  Dried beans and soy products.  Starchy vegetables, such as potatoes, peas, and corn.  Fruit and fruit juices.  Milk and yogurt.  Sweets and snack foods, such as cake, cookies, candy, chips, and soft drinks.  How do I count carbohydrates? There are two ways to count carbohydrates in food. You can use either of the methods or a combination of both. Reading "Nutrition Facts" on packaged food The "Nutrition Facts" list is included on the labels of almost all packaged foods and beverages in the [place] It includes:  The serving size.  Information about nutrients in each serving, including the grams (g) of carbohydrate per serving.  To use the "Nutrition Facts":  Decide how many servings you will have.  Multiply the number of servings by the number of carbohydrates per serving.  The resulting number is the total amount of carbohydrates that you will be having.  Learning standard serving sizes of other foods When you eat foods containing  carbohydrates that are not packaged or do not include "Nutrition Facts" on the label, you need to measure the servings in order to count the amount of carbohydrates:  Measure the foods that you will eat with a food scale or measuring cup, if needed.  Decide how many standard-size servings you will eat.  Multiply the number of servings by 15. Most carbohydrate-rich foods have about 15 g of carbohydrates per serving. ? For example, if you eat 8 oz (170 g) of strawberries, you will have eaten 2 servings and 30 g of carbohydrates (2 servings x 15 g = 30 g).  For foods that have more than one food mixed, such as soups and casseroles, you must count the carbohydrates in each food that is included.  The following list contains standard serving sizes of common carbohydrate-rich foods. Each of these servings has about 15 g of carbohydrates:   hamburger bun or  English muffin.   oz (15 mL) syrup.   oz (14 g) jelly.  1 slice of bread.  1 six-inch tortilla.  3 oz (85 g) cooked rice or pasta.  4 oz (113 g) cooked dried beans.  4 oz (113 g) starchy vegetable, such as peas, corn, or potatoes.  4 oz (113 g) hot cereal.  4 oz (113 g) mashed potatoes or  of a large baked potato.  4 oz (113 g) canned or frozen fruit.  4 oz (120 mL) fruit juice.  4-6 crackers.  6 chicken nuggets.  6 oz (170 g) unsweetened dry cereal.  6 oz (170 g) plain fat-free yogurt  or yogurt sweetened with artificial sweeteners.  8 oz (240 mL) milk.  8 oz (170 g) fresh fruit or one small piece of fruit.  24 oz (680 g) popped popcorn.  Example of carbohydrate counting Sample meal  3 oz (85 g) chicken breast.  6 oz (170 g) brown rice.  4 oz (113 g) corn.  8 oz (240 mL) milk.  8 oz (170 g) strawberries with sugar-free whipped topping. Carbohydrate calculation 1. Identify the foods that contain carbohydrates: ? Rice. ? Corn. ? Milk. ? Strawberries. 2. Calculate how many servings you have of each  food: ? 2 servings rice. ? 1 serving corn. ? 1 serving milk. ? 1 serving strawberries. 3. Multiply each number of servings by 15 g: ? 2 servings rice x 15 g = 30 g. ? 1 serving corn x 15 g = 15 g. ? 1 serving milk x 15 g = 15 g. ? 1 serving strawberries x 15 g = 15 g. 4. Add together all of the amounts to find the total grams of carbohydrates eaten: ? 30 g + 15 g + 15 g + 15 g = 75 g of carbohydrates total. This information is not intended to replace advice given to you by your health care provider. Make sure you discuss any questions you have with your health care provider. Document Released: 07/26/2005 Document Revised: 02/13/2016 Document Reviewed: 01/07/2016 Elsevier Interactive Patient Education  2018 [employer] Eating Plan DASH stands for "Dietary Approaches to Stop Hypertension." The DASH eating plan is a healthy eating plan that has been shown to reduce high blood pressure (hypertension). It may also reduce your risk for type 2 diabetes, heart disease, and stroke. The DASH eating plan may also help with weight loss. What are tips for following this plan? General guidelines  Avoid eating more than 2,300 mg (milligrams) of salt (sodium) a day. If you have hypertension, you may need to reduce your sodium intake to 1,500 mg a day.  Limit alcohol intake to no more than 1 drink a day for nonpregnant women and 2 drinks a day for men. One drink equals 12 oz of beer, 5 oz of wine, or 1 oz of hard liquor.  Work with your health care provider to maintain a healthy body weight or to lose weight. Ask what an ideal weight is for you.  Get at least 30 minutes of exercise that causes your heart to beat faster (aerobic exercise) most days of the week. Activities may include walking, swimming, or biking.  Work with your health care provider or diet and nutrition specialist (dietitian) to adjust your eating plan to your individual calorie needs. Reading food labels  Check food labels  for the amount of sodium per serving. Choose foods with less than 5 percent of the Daily Value of sodium. Generally, foods with less than 300 mg of sodium per serving fit into this eating plan.  To find whole grains, look for the word "whole" as the first word in the ingredient list. Shopping  Buy products labeled as "low-sodium" or "no salt added."  Buy fresh foods. Avoid canned foods and premade or frozen meals. Cooking  Avoid adding salt when cooking. Use salt-free seasonings or herbs instead of table salt or sea salt. Check with your health care provider or pharmacist before using salt substitutes.  Do not fry foods. Cook foods using healthy methods such as baking, boiling, grilling, and broiling instead.  Cook with heart-healthy oils, such as olive, canola, soybean,  or sunflower oil. Meal planning   Eat a balanced diet that includes: ? 5 or more servings of fruits and vegetables each day. At each meal, try to fill half of your plate with fruits and vegetables. ? Up to 6-8 servings of whole grains each day. ? Less than 6 oz of lean meat, poultry, or fish each day. A 3-oz serving of meat is about the same size as a deck of cards. One egg equals 1 oz. ? 2 servings of low-fat dairy each day. ? A serving of nuts, seeds, or beans 5 times each week. ? Heart-healthy fats. Healthy fats called Omega-3 fatty acids are found in foods such as flaxseeds and coldwater fish, like sardines, salmon, and mackerel.  Limit how much you eat of the following: ? Canned or prepackaged foods. ? Food that is high in trans fat, such as fried foods. ? Food that is high in saturated fat, such as fatty meat. ? Sweets, desserts, sugary drinks, and other foods with added sugar. ? Full-fat dairy products.  Do not salt foods before eating.  Try to eat at least 2 vegetarian meals each week.  Eat more home-cooked food and less restaurant, buffet, and fast food.  When eating at a restaurant, ask that your food  be prepared with less salt or no salt, if possible. What foods are recommended? The items listed may not be a complete list. Talk with your dietitian about what dietary choices are best for you. Grains Whole-grain or whole-wheat bread. Whole-grain or whole-wheat pasta. Brown rice. [place]. Bulgur. Whole-grain and low-sodium cereals. Pita bread. Low-fat, low-sodium crackers. Whole-wheat flour tortillas. Vegetables Fresh or frozen vegetables (raw, steamed, roasted, or grilled). Low-sodium or reduced-sodium tomato and vegetable juice. Low-sodium or reduced-sodium tomato sauce and tomato paste. Low-sodium or reduced-sodium canned vegetables. Fruits All fresh, dried, or frozen fruit. Canned fruit in natural juice (without added sugar). Meat and other protein foods Skinless chicken or [place]. Ground chicken or [place]. Pork with fat trimmed off. Fish and seafood. Egg whites. Dried beans, peas, or lentils. Unsalted nuts, nut butters, and seeds. Unsalted canned beans. Lean cuts of beef with fat trimmed off. Low-sodium, lean deli meat. Dairy Low-fat (1%) or fat-free (skim) milk. Fat-free, low-fat, or reduced-fat cheeses. Nonfat, low-sodium ricotta or cottage cheese. Low-fat or nonfat yogurt. Low-fat, low-sodium cheese. Fats and oils Soft margarine without trans fats. Vegetable oil. Low-fat, reduced-fat, or light mayonnaise and salad dressings (reduced-sodium). Canola, safflower, olive, soybean, and sunflower oils. Avocado. Seasoning and other foods Herbs. Spices. Seasoning mixes without salt. Unsalted popcorn and pretzels. Fat-free sweets. What foods are not recommended? The items listed may not be a complete list. Talk with your dietitian about what dietary choices are best for you. Grains Baked goods made with fat, such as croissants, muffins, or some breads. Dry pasta or rice meal packs. Vegetables Creamed or fried vegetables. Vegetables in a cheese sauce. Regular canned vegetables (not  low-sodium or reduced-sodium). Regular canned tomato sauce and paste (not low-sodium or reduced-sodium). Regular tomato and vegetable juice (not low-sodium or reduced-sodium). Angie Fava. Olives. Fruits Canned fruit in a light or heavy syrup. Fried fruit. Fruit in cream or butter sauce. Meat and other protein foods Fatty cuts of meat. Ribs. Fried meat. Berniece Salines. Sausage. Bologna and other processed lunch meats. Salami. Fatback. Hotdogs. Bratwurst. Salted nuts and seeds. Canned beans with added salt. Canned or smoked fish. Whole eggs or egg yolks. Chicken or [place] with skin. Dairy Whole or 2% milk, cream, and half-and-half. Whole or  full-fat cream cheese. Whole-fat or sweetened yogurt. Full-fat cheese. Nondairy creamers. Whipped toppings. Processed cheese and cheese spreads. Fats and oils Butter. Stick margarine. Lard. Shortening. Ghee. Bacon fat. Tropical oils, such as coconut, palm kernel, or palm oil. Seasoning and other foods Salted popcorn and pretzels. Onion salt, garlic salt, seasoned salt, table salt, and sea salt. Worcestershire sauce. Tartar sauce. Barbecue sauce. Teriyaki sauce. Soy sauce, including reduced-sodium. Steak sauce. Canned and packaged gravies. Fish sauce. Oyster sauce. Cocktail sauce. Horseradish that you find on the shelf. Ketchup. Mustard. Meat flavorings and tenderizers. Bouillon cubes. Hot sauce and Tabasco sauce. Premade or packaged marinades. Premade or packaged taco seasonings. Relishes. Regular salad dressings. Where to find more information:  National Heart, Lung, and [place]: https://wilson-eaton.com/  American Heart Association: www.heart.org Summary  The DASH eating plan is a healthy eating plan that has been shown to reduce high blood pressure (hypertension). It may also reduce your risk for type 2 diabetes, heart disease, and stroke.  With the DASH eating plan, you should limit salt (sodium) intake to 2,300 mg a day. If you have hypertension, you may need to reduce  your sodium intake to 1,500 mg a day.  When on the DASH eating plan, aim to eat more fresh fruits and vegetables, whole grains, lean proteins, low-fat dairy, and heart-healthy fats.  Work with your health care provider or diet and nutrition specialist (dietitian) to adjust your eating plan to your individual calorie needs. This information is not intended to replace advice given to you by your health care provider. Make sure you discuss any questions you have with your health care provider. Document Released: 07/15/2011 Document Revised: 07/19/2016 Document Reviewed: 07/19/2016 Elsevier Interactive Patient Education  [employer].

## 2017-09-23 NOTE — Assessment & Plan Note (Signed)
She is focusing on diet. Literature provided. Declines nutrition referral at this time. Will repeat a1c at f/u

## 2017-09-23 NOTE — Progress Notes (Signed)
Subjective:    Patient ID: Linda Schmitt, female    DOB: 1956/06/27, 62 y.o.   MRN: [id number]  CC: Linda Schmitt is a 62 y.o. female who presents today for follow up.   HPI:  Right wrist knot x 6 months, waxes and wanes. NO numbness tingling, weakness of hand. No discharge, fever. Overall feels well.   [place] 11/28 - prednisone taper, started meloxicam, finished both with no relief. Wearing brace which helps. 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 Not taking OTC NSAIDs. No h/o GIB.   Continues to smoke.   Trying to eat more healthy since being told pre diabetic    HISTORY:  No past medical history on file. Past Surgical History:  Procedure Laterality Date  . TUBAL LIGATION     Family History  Problem Relation Age of Onset  . Cancer Mother        lymphoma  . Stroke Sister   . Cancer Brother        lung  . Cancer Sister        lung?  . Breast cancer Cousin 45       maternal    Allergies: Patient has no known allergies. Current Outpatient Medications on File Prior to Visit  Medication Sig Dispense Refill  . pravastatin (PRAVACHOL) 40 MG tablet Take 1 tablet (40 mg total) by mouth daily. 90 tablet 2   No current facility-administered medications on file prior to visit.     Social History   Tobacco Use  . Smoking status: Current Every Day Smoker    Packs/day: 0.75    Years: 40.00    Pack years: 30.00    Types: Cigarettes  . Smokeless tobacco: Never Used  Substance Use Topics  . Alcohol use: Yes    Comment: 2-3 glasses of wine per week; 2-3 beers per week  . Drug use: No    Review of Systems  Constitutional: Negative for chills and fever.  Respiratory: Negative for cough.   Cardiovascular: Negative for chest pain and palpitations.  Gastrointestinal: Negative for nausea and vomiting.  Skin: Negative for rash and wound.  Neurological: Negative for numbness and headaches.      Objective:    BP 135/80   Pulse 84   Temp  98.6 F (37 C) (Oral)   Resp 14   Wt 220 lb 8 oz (100 kg)   SpO2 (!) 85%   BMI 37.85 kg/m  BP Readings from Last 3 Encounters:  09/23/17 135/80  07/06/17 118/80  02/22/17 122/84   Wt Readings from Last 3 Encounters:  09/23/17 220 lb 8 oz (100 kg)  07/06/17 222 lb 3.2 oz (100.8 kg)  02/22/17 219 lb (99.3 kg)    Physical Exam  Constitutional: She appears well-developed and well-nourished.  Eyes: Conjunctivae are normal.  Cardiovascular: Normal rate, regular rhythm, normal heart sounds and normal pulses.  Bilateral palpable radial pulses.   Pulmonary/Chest: Effort normal and breath sounds normal. She has no wheezes. She has no rhonchi. She has no rales.  Musculoskeletal:       Right wrist: She exhibits normal range of motion, no tenderness, no bony tenderness, no swelling, no deformity and no laceration.       Arms: Right wrist soft cyst noted over distal radius, approx 30m. No erythema, discharge, increased heat.   Neurological: She is alert.  Bilateral hand sensation, grip strength normal, intact.   Skin: Skin is warm and dry.  Psychiatric: She  has a normal mood and affect. Her speech is normal and behavior is normal. Thought content normal.  Vitals reviewed.      Assessment & Plan:   Problem List Items Addressed This Visit      Musculoskeletal and Integument   Tendonitis of wrist, right - Primary    Unchanged. Symptoms c/w ganglion cyst. Doesn't appear infectious.  Advised to speak with employer about limiting lifting at work. Concerned that even if had surgery, would recur. Mobic for 6 months. Continue brace.  Ortho referral      Relevant Medications   meloxicam (MOBIC) 7.5 MG tablet   Other Relevant Orders   Ambulatory referral to Orthopedic Surgery     Other   Prediabetes    She is focusing on diet. Literature provided. Declines nutrition referral at this time. Will repeat a1c at f/u          I have discontinued Johonna V. Oppedisano's predniSONE. I have also  changed her meloxicam. Additionally, I am having her maintain her pravastatin.   Meds ordered this encounter  Medications  . meloxicam (MOBIC) 7.5 MG tablet    Sig: Take 1 tablet (7.5 mg total) by mouth daily. Take with food    Dispense:  90 tablet    Refill:  1    Order Specific Question:   Supervising Provider    Answer:   Crecencio Mc [[id number]]    Return precautions given.   Risks, benefits, and alternatives of the medications and treatment plan prescribed today were discussed, and patient expressed understanding.   Education regarding symptom management and diagnosis given to patient on AVS.  Continue to follow with Burnard Hawthorne, FNP for routine health maintenance.   [place] and I agreed with plan.   [name], FNP

## 2017-09-30 ENCOUNTER — Ambulatory Visit
Admission: RE | Admit: 2017-09-30 | Discharge: 2017-09-30 | Disposition: A | Discharge status: Home / Self Care | Payer: BLUE CROSS/BLUE SHIELD | Source: Ambulatory Visit | Attending: Oncology | Admitting: Oncology

## 2017-09-30 DIAGNOSIS — Z122 Encounter for screening for malignant neoplasm of respiratory organs: Secondary | ICD-10-CM

## 2017-09-30 DIAGNOSIS — I700 Atherosclerosis of aorta: Secondary | ICD-10-CM | POA: Diagnosis not present

## 2017-09-30 DIAGNOSIS — Z87891 Personal history of nicotine dependence: Secondary | ICD-10-CM

## 2017-10-03 ENCOUNTER — Encounter: Payer: Self-pay | Admitting: *Deleted

## 2017-10-17 ENCOUNTER — Telehealth: Payer: Self-pay | Admitting: Family

## 2017-10-17 NOTE — Telephone Encounter (Signed)
Please mail letter to patient:    [facility] you are doing well.   I received results from your annual CT lung scan from [facility].  It was noted on the exam that you have coronary artery atherosclerosis, or often referred to as hardening of the arteries.   This can put you at risk for stroke, heart attack.   Please make a follow up appointment with me so we can discuss lifestyle changes, if there is a need for cardiology evaluation, and/or cholesterol medication.   I would also like to ensure you have had a recent lipid panel ( fasting blood work) to ensure your cholesterol is at goal.   Look forward to seeing you!   Best,   [name], NP

## 2017-10-18 ENCOUNTER — Telehealth: Payer: Self-pay

## 2017-10-18 DIAGNOSIS — I700 Atherosclerosis of aorta: Secondary | ICD-10-CM

## 2017-10-18 NOTE — Telephone Encounter (Signed)
Copied from [place] [place]. Topic: Inquiry >> Oct 18, 2017  2:10 PM Arletha Grippe wrote: Reason for CRM: pt had a chest screening done a couple of weeks ago and wanted to find out the results.  Please call [phone] (

## 2017-10-19 NOTE — Telephone Encounter (Signed)
Results reviewed with patient, expressed understanding. Pt scheduled with Joycelyn Schmid on Fri April 5 at 10am to discuss results.  Nothing further needed.

## 2017-10-19 NOTE — Telephone Encounter (Signed)
Please call patient let her know that CT chest was benign - she should continue to do annually.  she should get a letter mailed from their office as well. It Also noted atherosclerosis which is essentially cholesterol buildup around the heart.  CT also shows a small nodule in left adrenal gland which sits on top of the left kidney.  States this is likely a benign adenoma.  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  Otherwise CT chest was unrevealing.   Patient is currently on cholesterol medication, and aspirin appropriate medications for cholesterol noted.   However it is been a year since her cholesterol panel was drawn and this would be important piece of information to have.  Please have patient make follow-up appointment with me so we control cholesterol and dicsuss CT ( if she has questions)  .  Furthermore, at follow-up we can discuss whether or not we should consult cardiology for risk stratification including a stress test the patient is already had one.  Please have patient make follow-up appointment.

## 2017-10-20 ENCOUNTER — Encounter: Payer: Self-pay | Admitting: *Deleted

## 2017-10-20 NOTE — Telephone Encounter (Signed)
Mailed letter °

## 2017-11-11 ENCOUNTER — Ambulatory Visit: Payer: BLUE CROSS/BLUE SHIELD | Admitting: Family

## 2017-11-11 ENCOUNTER — Encounter: Payer: Self-pay | Admitting: Family

## 2017-11-11 VITALS — BP 130/82 | HR 69 | Temp 98.2°F | Wt 218.1 lb

## 2017-11-11 DIAGNOSIS — D3502 Benign neoplasm of left adrenal gland: Secondary | ICD-10-CM | POA: Diagnosis not present

## 2017-11-11 DIAGNOSIS — I700 Atherosclerosis of aorta: Secondary | ICD-10-CM

## 2017-11-11 DIAGNOSIS — R7303 Prediabetes: Secondary | ICD-10-CM | POA: Diagnosis not present

## 2017-11-11 LAB — LIPID PANEL
Cholesterol: 153 mg/dL (ref 0–200)
HDL: 60.4 mg/dL (ref >39.00)
LDL Cholesterol: 79 mg/dL (ref 0–99)
NonHDL: 92.44
Total CHOL/HDL Ratio: 3
Triglycerides: 68 mg/dL (ref 0.0–149.0)
VLDL: 13.6 mg/dL (ref 0.0–40.0)

## 2017-11-11 LAB — HEMOGLOBIN A1C: Hgb A1c MFr Bld: 6.1 % (ref 4.6–6.5)

## 2017-11-11 NOTE — Progress Notes (Signed)
Subjective:    Patient ID: Linda Schmitt, female    DOB: August 11, 1955, 62 y.o.   MRN: [id number]  CC: Linda Schmitt is a 63 y.o. female who presents today for follow up.   HPI: Overall feeling well today.  Here today to discuss CT chest which showed atherosclerosis.  Also here for lab work.  Has decreased smoking from one pack per day to 1/2 pack per day.  Compliant with pravachacol.   Denies exertional chest pain or pressure, numbness or tingling radiating to left arm or jaw, palpitations, dizziness, frequent headaches, changes in vision, or shortness of breath.  Stress test normal over a decade ago per patient.   Prediabetes - Working on weight loss. Avoiding rice, potatoes.      HISTORY:  No past medical history on file. Past Surgical History:  Procedure Laterality Date  . TUBAL LIGATION     Family History  Problem Relation Age of Onset  . Cancer Mother        lymphoma  . Stroke Sister   . Cancer Brother        lung  . Cancer Sister        lung?  . Breast cancer Cousin 45       maternal    Allergies: Patient has no known allergies. Current Outpatient Medications on File Prior to Visit  Medication Sig Dispense Refill  . meloxicam (MOBIC) 7.5 MG tablet Take 1 tablet (7.5 mg total) by mouth daily. Take with food 90 tablet 1  . pravastatin (PRAVACHOL) 40 MG tablet Take 1 tablet (40 mg total) by mouth daily. 90 tablet 2   No current facility-administered medications on file prior to visit.     Social History   Tobacco Use  . Smoking status: Current Every Day Smoker    Packs/day: 0.75    Years: 40.00    Pack years: 30.00    Types: Cigarettes  . Smokeless tobacco: Never Used  Substance Use Topics  . Alcohol use: Yes    Comment: 2-3 glasses of wine per week; 2-3 beers per week  . Drug use: No    Review of Systems  Constitutional: Negative for chills and fever.  Respiratory: Negative for cough.   Cardiovascular: Negative for chest pain and  palpitations.  Gastrointestinal: Negative for nausea and vomiting.      Objective:    BP 130/82 (BP Location: Left Arm, Patient Position: Sitting, Cuff Size: Normal)   Pulse 69   Temp 98.2 F (36.8 C) (Oral)   Wt 218 lb 2 oz (98.9 kg)   SpO2 97%   BMI 38.64 kg/m  BP Readings from Last 3 Encounters:  11/11/17 130/82  09/23/17 135/80  07/06/17 118/80   Wt Readings from Last 3 Encounters:  11/11/17 218 lb 2 oz (98.9 kg)  09/30/17 220 lb (99.8 kg)  09/23/17 220 lb 8 oz (100 kg)    Physical Exam  Constitutional: She appears well-developed and well-nourished.  Eyes: Conjunctivae are normal.  Cardiovascular: Normal rate, regular rhythm, normal heart sounds and normal pulses.  Pulmonary/Chest: Effort normal and breath sounds normal. She has no wheezes. She has no rhonchi. She has no rales.  Neurological: She is alert.  Skin: Skin is warm and dry.  Psychiatric: She has a normal mood and affect. Her speech is normal and behavior is normal. Thought content normal.  Vitals reviewed.      Assessment & Plan:   Problem List Items Addressed This Visit  Cardiovascular and Mediastinum   Atherosclerosis of aorta ([place]) - Primary    Discussed finding at length with patient.  She is currently on cholesterol medication.  Pending lipid panel today.  Also due to her risk factors, age, smoking, we jointly agreed cardiac consult for further evaluation, possibly stress test      Relevant Orders   Ambulatory referral to Cardiology   Hemoglobin A1c   Lipid panel     Endocrine   Adrenal adenoma, left    Low-attenuation, less than 4 cm in diameter.  Unchanged from prior.  Reviewed this finding in detail with patient today.  Both of [place] are comfortable with continued monitoring and I suspect benign per radiologist report.        Other   Prediabetes    Congratulated patient on weight loss.  Pending A1c          I am having [place] maintain her pravastatin and  meloxicam.   No orders of the defined types were placed in this encounter.   Return precautions given.   Risks, benefits, and alternatives of the medications and treatment plan prescribed today were discussed, and patient expressed understanding.   Education regarding symptom management and diagnosis given to patient on AVS.  Continue to follow with Burnard Hawthorne, FNP for routine health maintenance.   [place] and I agreed with plan.   [name], FNP

## 2017-11-11 NOTE — Assessment & Plan Note (Signed)
Discussed finding at length with patient.  She is currently on cholesterol medication.  Pending lipid panel today.  Also due to her risk factors, age, smoking, we jointly agreed cardiac consult for further evaluation, possibly stress test

## 2017-11-11 NOTE — Assessment & Plan Note (Signed)
Congratulated patient on weight loss.  Pending A1c

## 2017-11-11 NOTE — Patient Instructions (Addendum)
Today we discussed referrals, orders. CARDIOLOGY    I have placed these orders in the system for you.  Please be sure to give [place] a call if you have not heard from our office regarding scheduling a test or regarding referral in a timely manner.  It is very important that you let me know as soon as possible.    Congrats on weight loss.   This is  Dr. Lupita Dawn  example of a  "Low GI"  Diet:  It will allow you to lose 4 to 8  lbs  per month if you follow it carefully.  Your goal with exercise is a minimum of 30 minutes of aerobic exercise 5 days per week (Walking does not count once it becomes easy!)    All of the foods can be found at grocery stores and in bulk at [employer].  The Atkins protein bars and shakes are available in more varieties at Target, [employer] and [place].     7 AM Breakfast:  Choose from the following:  Low carbohydrate Protein  Shakes (I recommend the  Premier Protein chocolate shakes,  EAS AdvantEdge "Carb Control" shakes  Or the Atkins shakes all are under 3 net carbs)     a scrambled egg/bacon/cheese burrito made with Mission's "carb balance" whole wheat tortilla  (about 10 net carbs )  Regulatory affairs officer (basically a quiche without the pastry crust) that is eaten cold and very convenient way to get your eggs.  8 carbs)  If you make your own protein shakes, avoid bananas and pineapple,  And use low carb greek yogurt or original /unsweetened almond or soy milk    Avoid cereal and bananas, oatmeal and cream of wheat and grits. They are loaded with carbohydrates!   10 AM: high protein snack:  Protein bar by Atkins (the snack size, under 200 cal, usually < 6 net carbs).    A stick of cheese:  Around 1 carb,  100 cal     Dannon Light n Fit [place] Yogurt  (80 cal, 8 carbs)  Other so called "protein bars" and Greek yogurts tend to be loaded with carbohydrates.  Remember, in food advertising, the word "energy" is synonymous for " carbohydrate."  Lunch:    A Sandwich using the bread choices listed, Can use any  Eggs,  lunchmeat, grilled meat or canned tuna), avocado, regular mayo/mustard  and cheese.  A Salad using blue cheese, ranch,  Goddess or vinagrette,  Avoid taco shells, croutons or "confetti" and no "candied nuts" but regular nuts OK.   No pretzels, nabs  or chips.  Pickles and miniature sweet peppers are a good low carb alternative that provide a "crunch"  The bread is the only source of carbohydrate in a sandwich and  can be decreased by trying some of the attached alternatives to traditional loaf bread   Avoid "Low fat dressings, as well as [place] dressings They are loaded with sugar!   3 PM/ Mid day  Snack:  Consider  1 ounce of  almonds, walnuts, pistachios, pecans, peanuts,  Macadamia nuts or a nut medley.  Avoid "granola and granola bars "  Mixed nuts are ok in moderation as long as there are no raisins,  cranberries or dried fruit.   KIND bars are OK if you get the low glycemic index variety   Try the prosciutto/mozzarella cheese sticks by Fior[employer]ci  In deli /backery section   High protein  6 PM  Dinner:     Meat/fowl/fish with a green salad, and either broccoli, cauliflower, green beans, spinach, brussel sprouts or  Lima beans. DO NOT BREAD THE PROTEIN!!      There is a low carb pasta by Dreamfield's that is acceptable and tastes great: only 5 digestible carbs/serving.( All grocery stores but BJs carry it ) Several ready made meals are available low carb:   Try Michel Angelo's chicken piccata or chicken or eggplant parm over low carb pasta.(Lowes and BJs)   Marjory Lies Sanchez's "Carnitas" (pulled pork, no sauce,  0 carbs) or his beef pot roast to make a dinner burrito (at [employer]J's)  Pesto over low carb pasta (bj's sells a good quality pesto in the center refrigerated section of the deli   Try satueeing  Cheral Marker with mushroooms as a good side   [employer] makes a mashed cauliflower that tastes like mashed  potatoes  Whole wheat pasta is still full of digestible carbs and  Not as low in glycemic index as Dreamfield's.   Brown rice is still rice,  So skip the rice and noodles if you eat [place] or [place] (or at least limit to 1/2 cup)  9 PM snack :   Breyer's "low carb" fudgsicle or  ice cream bar (Carb Smart line), or  Weight Watcher's ice cream bar , or another "no sugar added" ice cream;  a serving of fresh berries/cherries with whipped cream   Cheese or DANNON'S LlGHT N FIT GREEK YOGURT  8 ounces of [place] unsweetened almond/cococunut milk    Treat yourself to a parfait made with whipped cream blueberiies, walnuts and vanilla greek yogurt  Avoid bananas, pineapple, grapes  and watermelon on a regular basis because they are high in sugar.  THINK OF THEM AS DESSERT  Remember that snack Substitutions should be less than 10 NET carbs per serving and meals < 20 carbs. Remember to subtract fiber grams to get the "net carbs."  @TULLOBREADPACKAGE @

## 2017-11-11 NOTE — Assessment & Plan Note (Signed)
Low-attenuation, less than 4 cm in diameter.  Unchanged from prior.  Reviewed this finding in detail with patient today.  Both of [place] are comfortable with continued monitoring and I suspect benign per radiologist report.

## 2018-01-12 ENCOUNTER — Ambulatory Visit: Payer: BLUE CROSS/BLUE SHIELD | Admitting: Family

## 2018-01-13 ENCOUNTER — Ambulatory Visit: Payer: BLUE CROSS/BLUE SHIELD | Admitting: Family

## 2018-01-13 ENCOUNTER — Ambulatory Visit: Payer: BLUE CROSS/BLUE SHIELD | Admitting: Cardiovascular Disease

## 2018-01-13 ENCOUNTER — Encounter: Payer: Self-pay | Admitting: Cardiovascular Disease

## 2018-01-13 VITALS — BP 129/76 | HR 68 | Ht 63.0 in | Wt 216.5 lb

## 2018-01-13 VITALS — BP 120/68 | HR 59 | Temp 97.9°F | Wt 216.5 lb

## 2018-01-13 DIAGNOSIS — R079 Chest pain, unspecified: Secondary | ICD-10-CM | POA: Diagnosis not present

## 2018-01-13 DIAGNOSIS — E785 Hyperlipidemia, unspecified: Secondary | ICD-10-CM | POA: Diagnosis not present

## 2018-01-13 DIAGNOSIS — Z136 Encounter for screening for cardiovascular disorders: Secondary | ICD-10-CM

## 2018-01-13 DIAGNOSIS — I700 Atherosclerosis of aorta: Secondary | ICD-10-CM

## 2018-01-13 DIAGNOSIS — R7303 Prediabetes: Secondary | ICD-10-CM | POA: Diagnosis not present

## 2018-01-13 DIAGNOSIS — Z87891 Personal history of nicotine dependence: Secondary | ICD-10-CM

## 2018-01-13 DIAGNOSIS — Z720 Tobacco use: Secondary | ICD-10-CM | POA: Diagnosis not present

## 2018-01-13 NOTE — Patient Instructions (Addendum)
Medication Instructions: Your physician recommends that you continue on your current medications as directed. Please refer to the Current Medication list given to you today.  If you need a refill on your cardiac medications before your next appointment, please call your pharmacy.   Procedures/Testing: Your physician has requested that you have an abdominal aorta duplex. During this test, an ultrasound is used to evaluate the aorta. Allow 30 minutes for this exam. Do not eat after midnight the day before and avoid carbonated beverages. This will take place at the [facility] office.  Your physician has requested that you have an exercise tolerance test. For further information please visit [email or web address]. Please also follow instruction sheet, as given. This will take place at the [facility] office.   Follow-Up: Your physician wants you to follow-up as needed with Dr. Fletcher Anon.    Thank you for choosing [facility] at [facility]!

## 2018-01-13 NOTE — Assessment & Plan Note (Signed)
Plans to quit without medication in the next month.  Politely declines Wellbutrin, Chantix at this time.  Will discuss at future visits.

## 2018-01-13 NOTE — Progress Notes (Signed)
Subjective:    Patient ID: Linda Schmitt, female    DOB: 1955/10/22, 62 y.o.   MRN: [id number]  CC: Linda Schmitt is a 62 y.o. female who presents today for follow up.   HPI: Doing well today, no complaints. Plans to stop quit smoking in the next month.  She would like to do this cold [place].  No history of seizure, heavy alcohol use  She also sees cardiology today, Dr. Fletcher Anon.    Prediabetes-no formal exercise.  Walks occasionally.  Like a referral to nutrition  Wrist pain-unchanged.  Meloxicam with some relief.  Takes daily with food    10/2017 - ganglion cyst- following for now.  HISTORY:  No past medical history on file. Past Surgical History:  Procedure Laterality Date  . TUBAL LIGATION     Family History  Problem Relation Age of Onset  . Cancer Mother        lymphoma  . Stroke Sister   . Cancer Brother        lung  . Cancer Sister        lung?  . Breast cancer Cousin 45       maternal    Allergies: Patient has no known allergies. Current Outpatient Medications on File Prior to Visit  Medication Sig Dispense Refill  . meloxicam (MOBIC) 7.5 MG tablet Take 1 tablet (7.5 mg total) by mouth daily. Take with food 90 tablet 1  . pravastatin (PRAVACHOL) 40 MG tablet Take 1 tablet (40 mg total) by mouth daily. 90 tablet 2   No current facility-administered medications on file prior to visit.     Social History   Tobacco Use  . Smoking status: Current Every Day Smoker    Packs/day: 0.75    Years: 40.00    Pack years: 30.00    Types: Cigarettes  . Smokeless tobacco: Never Used  Substance Use Topics  . Alcohol use: Yes    Comment: 2-3 glasses of wine per week; 2-3 beers per week  . Drug use: No    Review of Systems  Constitutional: Negative for chills and fever.  Respiratory: Negative for cough.   Cardiovascular: Negative for chest pain and palpitations.  Gastrointestinal: Negative for nausea and vomiting.  Musculoskeletal: Positive for arthralgias  (wrist pain, chronic).      Objective:    BP 120/68 (BP Location: Left Arm, Patient Position: Sitting, Cuff Size: Large)   Pulse (!) 59   Temp 97.9 F (36.6 C) (Oral)   Wt 216 lb 8 oz (98.2 kg)   SpO2 97%   BMI 38.35 kg/m  BP Readings from Last 3 Encounters:  01/13/18 120/68  11/11/17 130/82  09/23/17 135/80   Wt Readings from Last 3 Encounters:  01/13/18 216 lb 8 oz (98.2 kg)  11/11/17 218 lb 2 oz (98.9 kg)  09/30/17 220 lb (99.8 kg)    Physical Exam  Constitutional: She appears well-developed and well-nourished.  Eyes: Conjunctivae are normal.  Cardiovascular: Normal rate, regular rhythm, normal heart sounds and normal pulses.  Pulmonary/Chest: Effort normal and breath sounds normal. She has no wheezes. She has no rhonchi. She has no rales.  Neurological: She is alert.  Skin: Skin is warm and dry.  Psychiatric: She has a normal mood and affect. Her speech is normal and behavior is normal. Thought content normal.  Vitals reviewed.      Assessment & Plan:   Problem List Items Addressed This Visit      Other  Personal history of tobacco use, presenting hazards to health    Plans to quit without medication in the next month.  Politely declines Wellbutrin, Chantix at this time.  Will discuss at future visits.      Prediabetes - Primary    Discussed lifestyle modification.  Politely declines referral to [facility] at this time.  She would however like to see nutritionist.  Referral is been placed.  Will follow      Relevant Orders   Referral to Nutrition and Diabetes Services       I am having Danny Lawless Taff maintain her pravastatin and meloxicam.   No orders of the defined types were placed in this encounter.   Return precautions given.   Risks, benefits, and alternatives of the medications and treatment plan prescribed today were discussed, and patient expressed understanding.   Education regarding symptom management and diagnosis given to  patient on AVS.  Continue to follow with Burnard Hawthorne, FNP for routine health maintenance.   [place] and I agreed with plan.   [name], FNP

## 2018-01-13 NOTE — Patient Instructions (Signed)
Today we discussed referrals, orders. Nutrition   I have placed these orders in the system for you.  Please be sure to give [place] a call if you have not heard from our office regarding scheduling a test or regarding referral in a timely manner.  It is very important that you let me know as soon as possible.    Let me know about referral to Medical Weight Loss; also let me know about trialing Wellbutrin to help quit smoking, weight loss.

## 2018-01-13 NOTE — Assessment & Plan Note (Signed)
Discussed lifestyle modification.  Politely declines referral to [facility] at this time.  She would however like to see nutritionist.  Referral is been placed.  Will follow

## 2018-01-13 NOTE — Progress Notes (Signed)
Cardiology Office Note   Date:  01/13/2018   ID:  Linda Schmitt, DOB April 07, 1956, MRN [id number]  PCP:  Burnard Hawthorne, FNP  Cardiologist:   Kathlyn Sacramento, MD   Chief Complaint  Patient presents with  . OTHER    Atherosclerosis of aorta c/o chest pain. Meds reviewed verbally with pt.      History of Present Illness: Linda Schmitt is a 62 y.o. female who was referred by [name] for evaluation of aortic atherosclerosis.  Patient has no prior cardiac history and reports having a stress test more than 8 years ago which was unremarkable.  She has known history of hyperlipidemia and prolonged history of tobacco use.  She smokes at least half a pack per day and has been doing so since she was 62 years old.  She has no family history of coronary artery disease or sudden death.  However, she reports that her sister had an aneurysm likely cerebral.  The patient had a recent CT scan of the lungs for cancer screening which was unremarkable but did show evidence of aortic atherosclerosis.  The patient reports occasional episodes of left-sided chest pain described as burning sensation that happens mostly at rest and not with exertion.  She denies significant dyspnea, orthopnea or PND.  No leg claudication.    History reviewed. No pertinent past medical history.  Past Surgical History:  Procedure Laterality Date  . TUBAL LIGATION       Current Outpatient Medications  Medication Sig Dispense Refill  . meloxicam (MOBIC) 7.5 MG tablet Take 1 tablet (7.5 mg total) by mouth daily. Take with food 90 tablet 1  . pravastatin (PRAVACHOL) 40 MG tablet Take 1 tablet (40 mg total) by mouth daily. 90 tablet 2   No current facility-administered medications for this visit.     Allergies:   Patient has no known allergies.    Social History:  The patient  reports that she has been smoking cigarettes.  She has a 30.00 pack-year smoking history. She has never used smokeless tobacco. She  reports that she drinks alcohol. She reports that she does not use drugs.   Family History:  The patient's family history includes Breast cancer (age of onset: 22) in her cousin; Cancer in her brother, mother, and sister; Stroke in her sister.    ROS:  Please see the history of present illness.   Otherwise, review of systems are positive for none.   All other systems are reviewed and negative.    PHYSICAL EXAM: VS:  BP 129/76 (BP Location: Right Arm, Patient Position: Sitting, Cuff Size: Large)   Pulse 68   Ht 5\' 3"  (1.6 m)   Wt 216 lb 8 oz (98.2 kg)   BMI 38.35 kg/m  , BMI Body mass index is 38.35 kg/m. GEN: Well nourished, well developed, in no acute distress  HEENT: normal  Neck: no JVD, carotid bruits, or masses Cardiac: RRR; no murmurs, rubs, or gallops,no edema  Respiratory:  clear to auscultation bilaterally, normal work of breathing GI: soft, nontender, nondistended, + BS MS: no deformity or atrophy  Skin: warm and dry, no rash Neuro:  Strength and sensation are intact Psych: euthymic mood, full affect   EKG:  EKG is ordered today. The ekg ordered today demonstrates normal sinus rhythm with no significant ST or T wave changes.   Recent Labs: 07/06/2017: ALT 14; BUN 9; Creatinine, Ser 0.95; Potassium 4.1; Sodium 140    Lipid Panel  Component Value Date/Time   CHOL 153 11/11/2017 1053   TRIG 68.0 11/11/2017 1053   HDL 60.40 11/11/2017 1053   CHOLHDL 3 11/11/2017 1053   VLDL 13.6 11/11/2017 1053   LDLCALC 79 11/11/2017 1053      Wt Readings from Last 3 Encounters:  01/13/18 216 lb 8 oz (98.2 kg)  01/13/18 216 lb 8 oz (98.2 kg)  11/11/17 218 lb 2 oz (98.9 kg)       PAD Screen 01/13/2018  Previous PAD dx? No  Previous surgical procedure? No  Pain with walking? No  Feet/toe relief with dangling? No  Painful, non-healing ulcers? No  Extremities discolored? No      ASSESSMENT AND PLAN:  1.  Aortic atherosclerosis: I personally reviewed the CT scan  which showed mild aortic atherosclerosis mostly close to the arch.  Fortunately, there was no evidence of coronary atherosclerosis.  I recommend treatment of risk factors as is being done and strongly advised her to quit smoking.  We discussed healthy lifestyle changes.  2.  Atypical chest pain: She has risk factors for coronary artery disease and thus I requested a treadmill stress test.  Baseline EKG is unremarkable.  3.  Screening for abdominal aortic aneurysm: She has prolonged history of tobacco use and there is family history of what seems to be cerebral aneurysm.  Thus, I requested an abdominal aortic ultrasound for one-time screening.  4.  Hyperlipidemia: I agree with pravastatin.  Most recent LDL was 79.  5.  Tobacco use: The patient is highly interested in smoking cessation and she wants to quit in the near future.    Disposition:   FU with me as needed.   Signed,  Kathlyn Sacramento, MD  01/13/2018 11:41 AM    [place]

## 2018-01-17 ENCOUNTER — Other Ambulatory Visit: Payer: Self-pay | Admitting: Cardiovascular Disease

## 2018-01-17 DIAGNOSIS — I700 Atherosclerosis of aorta: Secondary | ICD-10-CM

## 2018-01-17 DIAGNOSIS — Z8249 Family history of ischemic heart disease and other diseases of the circulatory system: Secondary | ICD-10-CM

## 2018-01-17 DIAGNOSIS — Z87891 Personal history of nicotine dependence: Secondary | ICD-10-CM

## 2018-01-27 ENCOUNTER — Other Ambulatory Visit: Payer: BLUE CROSS/BLUE SHIELD

## 2018-02-06 ENCOUNTER — Telehealth: Payer: Self-pay | Admitting: Family

## 2018-02-06 DIAGNOSIS — E785 Hyperlipidemia, unspecified: Secondary | ICD-10-CM

## 2018-02-06 MED ORDER — PRAVASTATIN SODIUM 40 MG PO TABS: 40.0000 mg | ORAL_TABLET | Freq: Every day | ORAL | 0 refills | Status: DC

## 2018-02-06 NOTE — Telephone Encounter (Signed)
Copied from [place] [id number]. Topic: Quick Communication - Rx Refill/Question >> Feb 06, 2018 11:25 AM Selinda Flavin B, NT wrote: Medication: pravastatin (PRAVACHOL) 40 MG tablet   Has the patient contacted their pharmacy? Yes.   (Agent: If no, request that the patient contact the pharmacy for the refill.) (Agent: If yes, when and what did the pharmacy advise?)  Preferred Pharmacy (with phone number or street name): [id number] - [place], [place]: Please be advised that RX refills may take up to 3 business days. We ask that you follow-up with your pharmacy.

## 2018-02-22 ENCOUNTER — Telehealth: Payer: Self-pay | Admitting: Family

## 2018-02-22 DIAGNOSIS — Z1231 Encounter for screening mammogram for malignant neoplasm of breast: Secondary | ICD-10-CM

## 2018-02-22 NOTE — Telephone Encounter (Signed)
Pt needs a referral for a mammogram to [facility] and also wanted to let Joycelyn Schmid know that she quit smoking a month ago

## 2018-02-22 NOTE — Telephone Encounter (Signed)
Mammogram order placed. See message below.

## 2018-02-24 ENCOUNTER — Ambulatory Visit (INDEPENDENT_AMBULATORY_CARE_PROVIDER_SITE_OTHER): Payer: BLUE CROSS/BLUE SHIELD

## 2018-02-24 DIAGNOSIS — Z87891 Personal history of nicotine dependence: Secondary | ICD-10-CM | POA: Diagnosis not present

## 2018-02-24 DIAGNOSIS — R079 Chest pain, unspecified: Secondary | ICD-10-CM | POA: Diagnosis not present

## 2018-02-24 DIAGNOSIS — I700 Atherosclerosis of aorta: Secondary | ICD-10-CM

## 2018-02-24 DIAGNOSIS — Z8249 Family history of ischemic heart disease and other diseases of the circulatory system: Secondary | ICD-10-CM | POA: Diagnosis not present

## 2018-02-27 ENCOUNTER — Encounter: Payer: Self-pay | Admitting: Family

## 2018-02-27 NOTE — Telephone Encounter (Signed)
Noted  

## 2018-02-28 LAB — EXERCISE TOLERANCE TEST
Estimated workload: 7 METS
Exercise duration (min): 5 min
Exercise duration (sec): 45 s
MPHR: 160 {beats}/min
Peak HR: 160 {beats}/min
Percent HR: 102 %
Rest HR: 55 {beats}/min

## 2018-03-02 ENCOUNTER — Telehealth: Payer: Self-pay | Admitting: Cardiovascular Disease

## 2018-03-02 NOTE — Telephone Encounter (Signed)
Patient made aware of  AAA duplex results and verbalized her understanding.  Notes recorded by Wellington Hampshire, MD on 02/28/2018 at 8:45 AM EDT No evidence of aortic aneurysm.

## 2018-03-02 NOTE — Telephone Encounter (Signed)
Patient returning call to go over stress test results  

## 2018-03-13 ENCOUNTER — Other Ambulatory Visit: Payer: Self-pay | Admitting: Family

## 2018-03-13 DIAGNOSIS — M778 Other enthesopathies, not elsewhere classified: Secondary | ICD-10-CM

## 2018-03-13 NOTE — Telephone Encounter (Signed)
Meloxicam refill Last Refill:09/23/17 #90 with 1 refill Last OV: 01/13/18 PCP: Truddie Crumble Pharmacy: [facility] in [place]

## 2018-03-13 NOTE — Telephone Encounter (Unsigned)
Copied from [place] [id number]. Topic: Quick Communication - See Telephone Encounter >> Mar 13, 2018  2:50 PM Hewitt Shorts wrote: Pt is needing a refill on meloxicam   [facility]   Best number is [phone]

## 2018-03-14 MED ORDER — MELOXICAM 7.5 MG PO TABS: 7.5000 mg | ORAL_TABLET | Freq: Every day | ORAL | 2 refills | Status: DC

## 2018-03-14 NOTE — Telephone Encounter (Signed)
rx request 

## 2018-03-14 NOTE — Telephone Encounter (Signed)
Call pt  I refilled mobic however if she is continuing to have wrist pain at this time, since it has been several months, I would like to consult orthopedics. Is she okay with this?

## 2018-03-17 ENCOUNTER — Ambulatory Visit
Admission: RE | Admit: 2018-03-17 | Discharge: 2018-03-17 | Disposition: A | Discharge status: Home / Self Care | Payer: BLUE CROSS/BLUE SHIELD | Source: Ambulatory Visit | Attending: Family | Admitting: Family

## 2018-03-17 DIAGNOSIS — Z1231 Encounter for screening mammogram for malignant neoplasm of breast: Secondary | ICD-10-CM | POA: Diagnosis not present

## 2018-04-21 ENCOUNTER — Encounter: Payer: Self-pay | Admitting: Family

## 2018-04-21 ENCOUNTER — Ambulatory Visit: Payer: BLUE CROSS/BLUE SHIELD | Admitting: Family

## 2018-04-21 VITALS — BP 130/78 | HR 66 | Temp 97.9°F | Resp 16 | Wt 220.2 lb

## 2018-04-21 DIAGNOSIS — R7303 Prediabetes: Secondary | ICD-10-CM

## 2018-04-21 DIAGNOSIS — R51 Headache: Secondary | ICD-10-CM

## 2018-04-21 DIAGNOSIS — R519 Headache, unspecified: Secondary | ICD-10-CM

## 2018-04-21 DIAGNOSIS — G8929 Other chronic pain: Secondary | ICD-10-CM

## 2018-04-21 LAB — POCT GLYCOSYLATED HEMOGLOBIN (HGB A1C): Hemoglobin A1C: 5.7 % — AB (ref 4.0–5.6)

## 2018-04-21 NOTE — Patient Instructions (Addendum)
a1c looks great!  Let me know , as discussed, if your pain in your head continues, becomes more frequent, worsens, or new symptoms develop. I want to see you  It sounds like an ice pick headache; there is some evidence that taking melatonin 3mg  daily at 7pm nightly may be helpful if you would like to try

## 2018-04-21 NOTE — Assessment & Plan Note (Signed)
Well controlled. Will continue following

## 2018-04-21 NOTE — Assessment & Plan Note (Addendum)
Normal neurologic exam.  Discussed patient working diagnosis of an ice pick type headache.  Patient does not even describe it as a "headache".  At this gesture,it is not worsening, and infrequent. She declines imaging today.  Advised trial of melatonin. Long discussion with her to stay hypervigilant and advised her let me know if any worsening of symptoms, patient verbalized understanding

## 2018-04-21 NOTE — Progress Notes (Signed)
Subjective:    Patient ID: Linda Schmitt, female    DOB: 23-Jan-1956, 62 y.o.   MRN: [id number]  CC: Linda Schmitt is a 62 y.o. female who presents today for follow up.   HPI: Feeling well today.  Has quit smoking  Pre-DM- has started walking  Describes a 'shooting star pain' in her head , 'every now and then' over past year.'it is not a headache.'  Occurs once per month. Not worsening. No vision loss, confusion, facial numbness, falls, balance issues. Last for a couple of seconds. usually more left sided. No h/o migraines; never seen neurology.   Last eye exam- 2-3 years ago.     HISTORY:  No past medical history on file. Past Surgical History:  Procedure Laterality Date  . TUBAL LIGATION     Family History  Problem Relation Age of Onset  . Cancer Mother        lymphoma  . Stroke Sister   . Cancer Brother        lung  . Cancer Sister        lung?  . Breast cancer Cousin 45       maternal    Allergies: Patient has no known allergies. Current Outpatient Medications on File Prior to Visit  Medication Sig Dispense Refill  . ibuprofen (ADVIL,MOTRIN) 200 MG tablet Take 200 mg by mouth every 8 (eight) hours as needed.    . meloxicam (MOBIC) 7.5 MG tablet Take 1 tablet (7.5 mg total) by mouth daily. Take with food 90 tablet 2  . pravastatin (PRAVACHOL) 40 MG tablet Take 1 tablet (40 mg total) by mouth daily. 90 tablet 0   No current facility-administered medications on file prior to visit.     Social History   Tobacco Use  . Smoking status: Former Smoker    Packs/day: 0.75    Years: 40.00    Pack years: 30.00    Types: Cigarettes  . Smokeless tobacco: Never Used  . Tobacco comment: quit 02/2018  Substance Use Topics  . Alcohol use: Yes    Comment: 2-3 glasses of wine per week; 2-3 beers per week  . Drug use: No    Review of Systems  Constitutional: Negative for chills and fever.  Eyes: Negative for visual disturbance.  Respiratory: Negative for  cough.   Cardiovascular: Negative for chest pain and palpitations.  Gastrointestinal: Negative for nausea and vomiting.  Neurological: Negative for dizziness, numbness and headaches.      Objective:    BP 130/78 (BP Location: Left Arm, Patient Position: Sitting, Cuff Size: Large)   Pulse 66   Temp 97.9 F (36.6 C) (Oral)   Resp 16   Wt 220 lb 4 oz (99.9 kg)   SpO2 97%   BMI 39.02 kg/m  BP Readings from Last 3 Encounters:  04/21/18 130/78  01/13/18 129/76  01/13/18 120/68   Wt Readings from Last 3 Encounters:  04/21/18 220 lb 4 oz (99.9 kg)  01/13/18 216 lb 8 oz (98.2 kg)  01/13/18 216 lb 8 oz (98.2 kg)    Physical Exam  Constitutional: She appears well-developed and well-nourished.  HENT:  Mouth/Throat: Uvula is midline, oropharynx is clear and moist and mucous membranes are normal.  Eyes: Pupils are equal, round, and reactive to light. Conjunctivae and EOM are normal.  Fundus normal bilaterally.   Cardiovascular: Normal rate, regular rhythm, normal heart sounds and normal pulses.  Pulmonary/Chest: Effort normal and breath sounds normal. She has no wheezes. She  has no rhonchi. She has no rales.  Neurological: She is alert. She has normal strength. No cranial nerve deficit or sensory deficit. She displays a negative Romberg sign.  Reflex Scores:      Bicep reflexes are 2+ on the right side and 2+ on the left side.      Patellar reflexes are 2+ on the right side and 2+ on the left side. Grip equal and strong bilateral upper extremities. Gait strong and steady. Able to perform rapid alternating movement without difficulty.   Skin: Skin is warm and dry.  Psychiatric: She has a normal mood and affect. Her speech is normal and behavior is normal. Thought content normal.  Vitals reviewed.      Assessment & Plan:   Problem List Items Addressed This Visit      Other   Headache - Primary    Normal neurologic exam.  Discussed patient working diagnosis of an ice pick type  headache.  Patient does not even describe it as a "headache".  At this gesture,it is not worsening, and infrequent. She declines imaging today.  Advised trial of melatonin. Long discussion with her to stay hypervigilant and advised her let me know if any worsening of symptoms, patient verbalized understanding      Relevant Medications   ibuprofen (ADVIL,MOTRIN) 200 MG tablet   Prediabetes    Well controlled. Will continue following      Relevant Orders   POCT glycosylated hemoglobin (Hb A1C) (Completed)       I am having [place] maintain her pravastatin, meloxicam, and ibuprofen.   No orders of the defined types were placed in this encounter.   Return precautions given.   Risks, benefits, and alternatives of the medications and treatment plan prescribed today were discussed, and patient expressed understanding.   Education regarding symptom management and diagnosis given to patient on AVS.  Continue to follow with Burnard Hawthorne, FNP for routine health maintenance.   [place] and I agreed with plan.   [name], FNP

## 2018-09-27 ENCOUNTER — Telehealth: Payer: Self-pay | Admitting: *Deleted

## 2018-09-27 NOTE — Telephone Encounter (Signed)
Left message for patient to notify them that it is time to schedule annual low dose lung cancer screening CT scan. Instructed patient to call back to verify information prior to the scan being scheduled.  

## 2018-10-02 ENCOUNTER — Telehealth: Payer: Self-pay | Admitting: *Deleted

## 2018-10-02 DIAGNOSIS — Z87891 Personal history of nicotine dependence: Secondary | ICD-10-CM

## 2018-10-02 DIAGNOSIS — Z122 Encounter for screening for malignant neoplasm of respiratory organs: Secondary | ICD-10-CM

## 2018-10-02 NOTE — Telephone Encounter (Signed)
Patient has been notified that annual lung cancer screening low dose CT scan is due currently or will be in near future. Confirmed that patient is within the age range of 55-77, and asymptomatic, (no signs or symptoms of lung cancer). Patient denies illness that would prevent curative treatment for lung cancer if found. Verified smoking history, (former, quit 01/07/18, 30.75 pack year). The shared decision making visit was done 09/21/16. Patient is agreeable for CT scan being scheduled.

## 2018-10-04 ENCOUNTER — Encounter: Payer: Self-pay | Admitting: *Deleted

## 2018-10-06 ENCOUNTER — Ambulatory Visit
Admission: RE | Admit: 2018-10-06 | Discharge: 2018-10-06 | Disposition: A | Discharge status: Home / Self Care | Payer: BLUE CROSS/BLUE SHIELD | Source: Ambulatory Visit | Attending: Oncology | Admitting: Oncology

## 2018-10-06 ENCOUNTER — Encounter: Payer: Self-pay | Admitting: *Deleted

## 2018-10-06 DIAGNOSIS — Z87891 Personal history of nicotine dependence: Secondary | ICD-10-CM | POA: Insufficient documentation

## 2018-10-06 DIAGNOSIS — Z122 Encounter for screening for malignant neoplasm of respiratory organs: Secondary | ICD-10-CM | POA: Diagnosis not present

## 2018-10-09 ENCOUNTER — Telehealth: Payer: Self-pay | Admitting: Family

## 2018-10-09 ENCOUNTER — Encounter: Payer: Self-pay | Admitting: *Deleted

## 2018-10-09 NOTE — Telephone Encounter (Signed)
Letter printed to send.

## 2018-10-09 NOTE — Telephone Encounter (Signed)
Call or mail letter  Linda Schmitt,   [facility] you are doing well.   I received results from your annual CT lung scan from [facility].   It was noted again on the exam that you have coronary artery atherosclerosis, or often referred to as hardening of the arteries.   This can put you at risk for stroke, heart attack.   Please make a follow up appointment with me so can continue to monitor your cholesterol and cholesterol medication as well as discuss , if necessary, further evaluation.   Look forward to seeing you!   Best,   [name], NP

## 2018-10-11 ENCOUNTER — Telehealth: Payer: Self-pay | Admitting: Family

## 2018-10-11 DIAGNOSIS — D3502 Benign neoplasm of left adrenal gland: Secondary | ICD-10-CM

## 2018-10-11 NOTE — Telephone Encounter (Signed)
Call patient  Her CT continues to show atherosclerosis, and also the left adrenal adenoma which we discussed in the past.  I know we have discussed this finding , and it does appear stable.  Think however I would be most comfortable and we did consult endocrine just to be sure no further evaluation needs to be done.  Is patient amenable to this?  Let me know I will place referral

## 2018-10-11 NOTE — Telephone Encounter (Signed)
Referral placed.

## 2018-11-03 ENCOUNTER — Ambulatory Visit: Payer: BLUE CROSS/BLUE SHIELD | Admitting: Family

## 2018-12-01 ENCOUNTER — Ambulatory Visit (INDEPENDENT_AMBULATORY_CARE_PROVIDER_SITE_OTHER): Payer: BLUE CROSS/BLUE SHIELD | Admitting: Family

## 2018-12-01 ENCOUNTER — Other Ambulatory Visit: Payer: Self-pay

## 2018-12-01 ENCOUNTER — Encounter: Payer: Self-pay | Admitting: Family

## 2018-12-01 DIAGNOSIS — M25511 Pain in right shoulder: Secondary | ICD-10-CM

## 2018-12-01 DIAGNOSIS — E785 Hyperlipidemia, unspecified: Secondary | ICD-10-CM

## 2018-12-01 DIAGNOSIS — D3502 Benign neoplasm of left adrenal gland: Secondary | ICD-10-CM | POA: Diagnosis not present

## 2018-12-01 DIAGNOSIS — R7303 Prediabetes: Secondary | ICD-10-CM | POA: Diagnosis not present

## 2018-12-01 DIAGNOSIS — I700 Atherosclerosis of aorta: Secondary | ICD-10-CM

## 2018-12-01 MED ORDER — PRAVASTATIN SODIUM 40 MG PO TABS: 40.0000 mg | ORAL_TABLET | Freq: Every day | ORAL | 0 refills | Status: DC

## 2018-12-01 NOTE — Progress Notes (Signed)
This visit type was conducted due to national recommendations for restrictions regarding the COVID-19 pandemic (e.g. social distancing).  This format is felt to be most appropriate for this patient at this time.  All issues noted in this document were discussed and addressed.  No physical exam was performed (except for noted visual exam findings with Video Visits). Virtual Visit via Video Note  I connected with@  on 12/01/18 at  8:30 AM EDT by a video enabled telemedicine application and verified that I am speaking with the correct person using two identifiers.  Location patient: home Location provider:work  Persons participating in the virtual visit: patient, provider  I discussed the limitations of evaluation and management by telemedicine and the availability of in person appointments. The patient expressed understanding and agreed to proceed.   HPI:  Quit smoking 9 months ago, has gained 10-15 lbs.  Walking 2 miles , 2x per week. Snacking more.  No CP, sob.   H/o prediabetic  Complains of occasional right shoulder pain, 3 months, unchanged.  Describes as an ache. Resolves with heat, tylenol, pain patch OTC.  No ROM. Sleeps on pillow on right side. No injury, falls.  No fever, joint swelling, or increased warmth. No pain in neck.  Taking mobic daily for right wrist pain.   No numbness, weakness. Left adrenal adenoma.  HLD- needs refill   ROS: See pertinent positives and negatives per HPI.  History reviewed. No pertinent past medical history.  Past Surgical History:  Procedure Laterality Date  . TUBAL LIGATION      Family History  Problem Relation Age of Onset  . Cancer Mother        lymphoma  . Stroke Sister   . Cancer Brother        lung  . Cancer Sister        lung?  . Breast cancer Cousin 72       maternal    SOCIAL HX: former smoker   Current Outpatient Medications:  .  meloxicam (MOBIC) 7.5 MG tablet, Take 1 tablet (7.5 mg total) by mouth daily. Take  with food, Disp: 90 tablet, Rfl: 2 .  pravastatin (PRAVACHOL) 40 MG tablet, Take 1 tablet (40 mg total) by mouth daily., Disp: 90 tablet, Rfl: 0  EXAM:  VITALS per patient if applicable:  GENERAL: alert, oriented, appears well and in no acute distress  HEENT: atraumatic, conjunttiva clear, no obvious abnormalities on inspection of external nose and ears  NECK: normal movements of the head and neck  LUNGS: on inspection no signs of respiratory distress, breathing rate appears normal, no obvious gross SOB, gasping or wheezing  CV: no obvious cyanosis  MS: moves all visible extremities without noticeable abnormality. Able to passive move bilateral arm in front of her, laterally and internally rotate without limitation or pain.   PSYCH/NEURO: pleasant and cooperative, no obvious depression or anxiety, speech and thought processing grossly intact  ASSESSMENT AND PLAN:  Discussed the following assessment and plan:  Acute pain of right shoulder - Plan: DG Shoulder Right  Adrenal adenoma, left - Plan: Ambulatory referral to Endocrinology  Hyperlipidemia, unspecified hyperlipidemia type - Plan: Comprehensive metabolic panel, Lipid panel  Prediabetes - Plan: Hemoglobin A1c  Atherosclerosis of aorta ([place]) Problem List Items Addressed This Visit      Cardiovascular and Mediastinum   Atherosclerosis of aorta ([place])    To resume pravachol. Refilled. Pending lipid panel.         Endocrine   Adrenal adenoma, left  Reviewed imaging from her CT chest in regards to left adenoma.  Although appears stable, I would feel most comfortable if patient did have consult with endocrinology for an evaluation, work-up if needed.  Patient in agreement.  Referral has been placed      Relevant Orders   Ambulatory referral to Endocrinology     Other   Hyperlipidemia   Relevant Orders   Comprehensive metabolic panel   Lipid panel   Prediabetes    Pending a1c      Relevant Orders   Hemoglobin  A1c   Acute pain of right shoulder - Primary    Patient well-appearing over video today, good range of motion.  No pain today.  Improvement with over-the-counter agents including heat, Tylenol, pain patches.  Advised her to continue these therapies.  Also advised she may try icing regimen to see if pain improves.  She politely declines referral to orthopedics today.  She will let me know if she would like to reconsider this.  We will pursue right shoulder x-ray.  She declines any x-ray of her neck, which I think is appropriate as does not appear the neck is involved in this time.      Relevant Orders   DG Shoulder Right         I discussed the assessment and treatment plan with the patient. The patient was provided an opportunity to ask questions and all were answered. The patient agreed with the plan and demonstrated an understanding of the instructions.   The patient was advised to call back or seek an in-person evaluation if the symptoms worsen or if the condition fails to improve as anticipated.   [name], FNP

## 2018-12-01 NOTE — Assessment & Plan Note (Signed)
To resume pravachol. Refilled. Pending lipid panel.

## 2018-12-01 NOTE — Assessment & Plan Note (Signed)
Reviewed imaging from her CT chest in regards to left adenoma.  Although appears stable, I would feel most comfortable if patient did have consult with endocrinology for an evaluation, work-up if needed.  Patient in agreement.  Referral has been placed

## 2018-12-01 NOTE — Patient Instructions (Addendum)
Fasting labs and Xray next week.    Let me know if would like referral to orthopedics due to shoulder pain.   Ice may help more than heat. Continue over the counter pain patches ( such as Salon Pas) and let me know of any new or worsening symptoms.   Today we discussed referrals, orders. Endocrine.    I have placed these orders in the system for you.  Please be sure to give [place] a call if you have not heard from our office regarding this. We should hear from [place] within ONE week with information regarding your appointment. If not, please let me know immediately.    This is  Dr. Lupita Dawn  example of a  "Low GI"  Diet:  It will allow you to lose 4 to 8  lbs  per month if you follow it carefully.  Your goal with exercise is a minimum of 30 minutes of aerobic exercise 5 days per week (Walking does not count once it becomes easy!)    All of the foods can be found at grocery stores and in bulk at [employer].  The Atkins protein bars and shakes are available in more varieties at Target, [employer] and [employer].     7 AM Breakfast:  Choose from the following:  Low carbohydrate Protein  Shakes (I recommend the  Premier Protein chocolate shakes,  EAS AdvantEdge "Carb Control" shakes  Or the Atkins shakes all are under 3 net carbs)     a scrambled egg/bacon/cheese burrito made with Mission's "carb balance" whole wheat tortilla  (about 10 net carbs )  Regulatory affairs officer (basically a quiche without the pastry crust) that is eaten cold and very convenient way to get your eggs.  8 carbs)  If you make your own protein shakes, avoid bananas and pineapple,  And use low carb greek yogurt or original /unsweetened almond or soy milk    Avoid cereal and bananas, oatmeal and cream of wheat and grits. They are loaded with carbohydrates!   10 AM: high protein snack:  Protein bar by Atkins (the snack size, under 200 cal, usually < 6 net carbs).    A stick of cheese:  Around 1 carb,  100 cal      Dannon Light n Fit [place] Yogurt  (80 cal, 8 carbs)  Other so called "protein bars" and Greek yogurts tend to be loaded with carbohydrates.  Remember, in food advertising, the word "energy" is synonymous for " carbohydrate."  Lunch:   A Sandwich using the bread choices listed, Can use any  Eggs,  lunchmeat, grilled meat or canned tuna), avocado, regular mayo/mustard  and cheese.  A Salad using blue cheese, ranch,  Goddess or vinagrette,  Avoid taco shells, croutons or "confetti" and no "candied nuts" but regular nuts OK.   No pretzels, nabs  or chips.  Pickles and miniature sweet peppers are a good low carb alternative that provide a "crunch"  The bread is the only source of carbohydrate in a sandwich and  can be decreased by trying some of the attached alternatives to traditional loaf bread   Avoid "Low fat dressings, as well as [place] dressings They are loaded with sugar!   3 PM/ Mid day  Snack:  Consider  1 ounce of  almonds, walnuts, pistachios, pecans, peanuts,  Macadamia nuts or a nut medley.  Avoid "granola and granola bars "  Mixed nuts are ok in moderation as long  as there are no raisins,  cranberries or dried fruit.   KIND bars are OK if you get the low glycemic index variety   Try the prosciutto/mozzarella cheese sticks by [employer]  In deli /backery section   High protein      6 PM  Dinner:     Meat/fowl/fish with a green salad, and either broccoli, cauliflower, green beans, spinach, brussel sprouts or  Lima beans. DO NOT BREAD THE PROTEIN!!      There is a low carb pasta by Dreamfield's that is acceptable and tastes great: only 5 digestible carbs/serving.( All grocery stores but BJs carry it ) Several ready made meals are available low carb:   Try Michel Angelo's chicken piccata or chicken or eggplant parm over low carb pasta.(Lowes and BJs)   Marjory Lies Sanchez's "Carnitas" (pulled pork, no sauce,  0 carbs) or his beef pot roast to make a dinner burrito (at  BJ's)  Pesto over low carb pasta (bj's sells a good quality pesto in the center refrigerated section of the deli   Try satueeing  Cheral Marker with mushroooms as a good side   [employer] Giant makes a mashed cauliflower that tastes like mashed potatoes  Whole wheat pasta is still full of digestible carbs and  Not as low in glycemic index as Dreamfield's.   Brown rice is still rice,  So skip the rice and noodles if you eat [place] or [place] (or at least limit to 1/2 cup)  9 PM snack :   Breyer's "low carb" fudgsicle or  ice cream bar (Carb Smart line), or  Weight Watcher's ice cream bar , or another "no sugar added" ice cream;  a serving of fresh berries/cherries with whipped cream   Cheese or DANNON'S LlGHT N FIT GREEK YOGURT  8 ounces of [place] unsweetened almond/cococunut milk    Treat yourself to a parfait made with whipped cream blueberiies, walnuts and vanilla greek yogurt  Avoid bananas, pineapple, grapes  and watermelon on a regular basis because they are high in sugar.  THINK OF THEM AS DESSERT  Remember that snack Substitutions should be less than 10 NET carbs per serving and meals < 20 carbs. Remember to subtract fiber grams to get the "net carbs."  @TULLOBREADPACKAGE @

## 2018-12-01 NOTE — Assessment & Plan Note (Signed)
Pending a1c. 

## 2018-12-01 NOTE — Assessment & Plan Note (Addendum)
Patient well-appearing over video today, good range of motion.  No pain today.  Improvement with over-the-counter agents including heat, Tylenol, pain patches.  Advised her to continue these therapies.  Also advised she may try icing regimen to see if pain improves.  She politely declines referral to orthopedics today.  She will let me know if she would like to reconsider this.  We will pursue right shoulder x-ray.  She declines any x-ray of her neck, which I think is appropriate as does not appear the neck is involved in this time.

## 2018-12-08 ENCOUNTER — Other Ambulatory Visit: Payer: Self-pay

## 2018-12-08 ENCOUNTER — Telehealth: Payer: Self-pay | Admitting: Family

## 2018-12-08 ENCOUNTER — Other Ambulatory Visit (INDEPENDENT_AMBULATORY_CARE_PROVIDER_SITE_OTHER): Payer: BLUE CROSS/BLUE SHIELD

## 2018-12-08 DIAGNOSIS — E785 Hyperlipidemia, unspecified: Secondary | ICD-10-CM

## 2018-12-08 DIAGNOSIS — R7303 Prediabetes: Secondary | ICD-10-CM

## 2018-12-08 NOTE — Telephone Encounter (Signed)
Noted. FYI 

## 2018-12-08 NOTE — Addendum Note (Signed)
Addended by: Arby Barrette on: 12/08/2018 08:36 AM   Modules accepted: Orders

## 2018-12-08 NOTE — Telephone Encounter (Signed)
Pt was here to do labs/xray today. Pt was advised that her [facility] is not covered at this office, pt is considered out of network. Pt was advised to call insurance to see where she can go to have labs/xray done.

## 2019-02-07 ENCOUNTER — Telehealth: Payer: Self-pay | Admitting: Family

## 2019-02-07 NOTE — Telephone Encounter (Signed)
Call pt rec'd labs from dr solum Her b12 is low. Solum advised OTC b12 1029mcg daily.  sch b12 labs to repeat 3-6 months She may call to have this scheduled

## 2019-02-07 NOTE — Telephone Encounter (Signed)
I spoke with patient & advised on below. After taking B-12 3-6 months she will call to schedule labs.

## 2019-02-26 ENCOUNTER — Other Ambulatory Visit: Payer: Self-pay | Admitting: Family

## 2019-02-26 DIAGNOSIS — E785 Hyperlipidemia, unspecified: Secondary | ICD-10-CM

## 2019-02-28 ENCOUNTER — Telehealth: Payer: Self-pay | Admitting: Family

## 2019-02-28 NOTE — Telephone Encounter (Signed)
close

## 2019-05-02 ENCOUNTER — Other Ambulatory Visit: Payer: Self-pay | Admitting: Family

## 2019-05-02 DIAGNOSIS — Z1231 Encounter for screening mammogram for malignant neoplasm of breast: Secondary | ICD-10-CM

## 2019-05-23 ENCOUNTER — Telehealth: Payer: Self-pay

## 2019-05-23 ENCOUNTER — Encounter: Payer: Self-pay | Admitting: Internal Medicine

## 2019-05-23 ENCOUNTER — Other Ambulatory Visit: Payer: Self-pay

## 2019-05-23 DIAGNOSIS — Z20822 Contact with and (suspected) exposure to covid-19: Secondary | ICD-10-CM

## 2019-05-23 DIAGNOSIS — Z20828 Contact with and (suspected) exposure to other viral communicable diseases: Secondary | ICD-10-CM | POA: Diagnosis not present

## 2019-05-23 NOTE — Telephone Encounter (Signed)
Letter ready to mail   Thanks Linda Schmitt

## 2019-05-23 NOTE — Telephone Encounter (Signed)
Copied from [place] [id number]. Topic: General - Inquiry >> May 23, 2019 12:30 PM Rutherford Nail, NT wrote: Reason for CRM: Patient calling and would like for the office to give her a call. States that she recently had a cold and lost her taste and smell and would like to know if that is normal. Please advise.  CB#: [phone]

## 2019-05-23 NOTE — Telephone Encounter (Signed)
Copied from [place] [id number]. Topic: General - Inquiry >> May 23, 2019 12:30 PM Rutherford Nail, NT wrote: Reason for CRM: Patient calling and would like for the office to give her a call. States that she recently had a cold and lost her taste and smell and would like to know if that is normal. Please advise.  CB#: [phone]

## 2019-05-23 NOTE — Telephone Encounter (Signed)
Called and spoke to patient.  Patient said that she had a cold and sneezing that started on Saturday.  Patient said that since she has lost her sense of smell and lost her taste.  Patient said that she took some OTC Tylenol Cold and wasn't sure if the loss of senses was coming from the meds.    Advised pt that these were COVID symptoms and asked patient if she had been exposed to anyone that had COVID.  Patient said that she was unaware of being exposed to anyone and said that she works for [employer] but has been wearing her mask and gloves at work.  Gave patient information to drive-thru COVID testing site at [facility].  Patient said that she was on her lunch break and about to go back to work.  Patient wanted to know if she could wait until tomorrow to be tested.  Advised patient to go tested today so that she doesn't potentially expose others.  Patient still wanted to wait until tomorrow.  Informed patient I will consult with provider  that is covering for [facility].  I spoke w/ Dr. Aundra Dubin, covering provider who advised that patient leave work now to get tested.  Patient informed and was agreeable to leave work and go get tested today.  Patient understands that she will need to self-quarantine until she gets test results back.  Patient understands that she will be given more information on self-quarantining at testing site.  Patient said that she will need a work note. Patient said that her job does not have a fax nor does she have [employer].  Patient requested that work note be mailed to her.

## 2019-05-23 NOTE — Telephone Encounter (Signed)
Patient said that she recently took OTC Tylenol Cold and wasn't sure if meds was causing symptoms.  Patient said that symptoms started on Saturday with chills, sneezing, patient is not sure.

## 2019-05-24 LAB — NOVEL CORONAVIRUS, NAA: SARS-CoV-2, NAA: DETECTED — AB

## 2019-05-24 NOTE — Telephone Encounter (Signed)
Called patient and spoke to patient.  Patient said that she did go to get the COVID test on yesterday.  Informed patient that her work note was ready and would be mailing letter out this morning.

## 2019-05-25 ENCOUNTER — Telehealth: Payer: Self-pay | Admitting: *Deleted

## 2019-05-25 NOTE — Telephone Encounter (Signed)
Reviewed positive [phone] results with patient. She started isolation measurements on 05/23/19. Reports her taste is improving daily. Reviewed isolation dates and requirements to end isolation. Encouraged fluids and rest. Call with any respiratory issues. Stated she understood. Health department notified by other.

## 2019-05-25 NOTE — Telephone Encounter (Signed)
Please schedule virtual follow up for next week  Thanks  LG

## 2019-05-25 NOTE — Telephone Encounter (Signed)
Called and scheduled pt for 10/21

## 2019-05-30 ENCOUNTER — Encounter: Payer: Self-pay | Admitting: Family Medicine

## 2019-05-30 ENCOUNTER — Other Ambulatory Visit: Payer: Self-pay

## 2019-05-30 ENCOUNTER — Ambulatory Visit (INDEPENDENT_AMBULATORY_CARE_PROVIDER_SITE_OTHER): Payer: Medicare HMO | Admitting: Family Medicine

## 2019-05-30 VITALS — Ht 63.0 in | Wt 230.0 lb

## 2019-05-30 DIAGNOSIS — U071 COVID-19: Secondary | ICD-10-CM | POA: Diagnosis not present

## 2019-05-30 DIAGNOSIS — M778 Other enthesopathies, not elsewhere classified: Secondary | ICD-10-CM | POA: Diagnosis not present

## 2019-05-30 DIAGNOSIS — H00015 Hordeolum externum left lower eyelid: Secondary | ICD-10-CM | POA: Diagnosis not present

## 2019-05-30 DIAGNOSIS — E785 Hyperlipidemia, unspecified: Secondary | ICD-10-CM

## 2019-05-30 MED ORDER — ERYTHROMYCIN 5 MG/GM OP OINT: 1.0000 "application " | TOPICAL_OINTMENT | Freq: Every day | OPHTHALMIC | 0 refills | Status: DC

## 2019-05-30 MED ORDER — MELOXICAM 7.5 MG PO TABS: 7.5000 mg | ORAL_TABLET | Freq: Every day | ORAL | 0 refills | Status: DC | PRN

## 2019-05-30 MED ORDER — PRAVASTATIN SODIUM 40 MG PO TABS: ORAL_TABLET | ORAL | 0 refills | Status: DC

## 2019-05-30 NOTE — Telephone Encounter (Signed)
Patient called back stating that her medicine was over $100 and she could not afford it.  Pt said that her medicines are normally free.  Pt is not sure if the new cream that was sent to the pharmacy after her visit today with Philis Nettle, NP is what is increasing the price.  I offered to send Lauren a message to see if she could send in a generic brand or if there was an alternative medicine but pt said that she will call back after she speaks with the pharmacy again.

## 2019-05-30 NOTE — Progress Notes (Signed)
Patient ID: Linda Schmitt, female   DOB: Jan 11, 1956, 63 y.o.   MRN: [id number]    Virtual Visit via video Note  This visit type was conducted due to national recommendations for restrictions regarding the COVID-19 pandemic (e.g. social distancing).  This format is felt to be most appropriate for this patient at this time.  All issues noted in this document were discussed and addressed.  No physical exam was performed (except for noted visual exam findings with Video Visits).   I connected with Dorisann Frames today at  8:40 AM EDT by a video enabled telemedicine application and verified that I am speaking with the correct person using two identifiers. Location patient: home Location provider: work or home office Persons participating in the virtual visit: patient, provider  I discussed the limitations, risks, security and privacy concerns of performing an evaluation and management service by telephone and the availability of in person appointments. I also discussed with the patient that there may be a patient responsible charge related to this service. The patient expressed understanding and agreed to proceed.   HPI:  Patient and I connected via video to follow-up on her COVID-19 diagnosis.  Patient states she is feeling quite well and currently has no symptoms.  Appetite is normal.  No cough, chest pain or shortness of breath.  No fever or chills.  No body aches.  She has also been in contact with her health department representative and her quarantine and date is 1024.  She is aware to monitor herself for any new symptoms developing into make either of [place] or health department aware.  Patient also wondering if she can get refill of meloxicam, uses this for tendinitis pain in wrist and other various joint pains.  Also reports irritation on left lower eyelid, wondering if maybe it is a stye or something is in her eye.  Try to look in her eye in the bathroom mirror and did not see any foreign  body or eyelash.  Denies any visual field loss, black spots in vision, floaters in vision or blurry vision.  States she is been doing some warm compresses on the eye which do seem to help at least a small amount.   ROS: See pertinent positives and negatives per HPI.  History reviewed. No pertinent past medical history.  Past Surgical History:  Procedure Laterality Date  . TUBAL LIGATION      Family History  Problem Relation Age of Onset  . Cancer Mother        lymphoma  . Stroke Sister   . Cancer Brother        lung  . Cancer Sister        lung?  . Breast cancer Cousin 30       maternal   Social History   Tobacco Use  . Smoking status: Former Smoker    Packs/day: 0.75    Years: 40.00    Pack years: 30.00    Types: Cigarettes    Quit date: 02/06/2018    Years since quitting: 1.3  . Smokeless tobacco: Never Used  . Tobacco comment: quit 02/2018  Substance Use Topics  . Alcohol use: Yes    Comment: 2-3 glasses of wine per week; 2-3 beers per week     Current Outpatient Medications:  .  meloxicam (MOBIC) 7.5 MG tablet, Take 1 tablet (7.5 mg total) by mouth daily. Take with food, Disp: 90 tablet, Rfl: 2 .  pravastatin (PRAVACHOL) 40 MG tablet, TAKE 1  TABLET(40 MG) BY MOUTH DAILY, Disp: 90 tablet, Rfl: 0  EXAM:  GENERAL: alert, oriented, appears well and in no acute distress  HEENT: atraumatic, conjunttiva clear (hard to see lower eyelid well over video) but no obvious abnormality or discharge/drainage, no obvious abnormalities on inspection of external nose and ears  NECK: normal movements of the head and neck  LUNGS: on inspection no signs of respiratory distress, breathing rate appears normal, no obvious gross SOB, gasping or wheezing  CV: no obvious cyanosis  MS: moves all visible extremities without noticeable abnormality  PSYCH/NEURO: pleasant and cooperative, no obvious depression or anxiety, speech and thought processing grossly intact  ASSESSMENT AND  PLAN:  Discussed the following assessment and plan:  Patient feeling much improved in regards to COVID-19 symptoms.  She will continue to quarantine until her release date on 1024.  Suspect from diet description that she has a stye.  We will treat with erythromycin ointment at bedtime, advised to avoid rubbing eyes and she can continue to use warm compresses.  Meloxicam refill sent in that patient uses as needed for tendinitis and wrist as well as other various joint pains.  She is aware to eat something with this medication as it can be irritating to the stomach.  COVID-19 virus detected  Hordeolum of left lower eyelid, unspecified hordeolum type - Plan: erythromycin ophthalmic ointment  Tendonitis of wrist, right - Plan: meloxicam (MOBIC) 7.5 MG tablet  I discussed the assessment and treatment plan with the patient. The patient was provided an opportunity to ask questions and all were answered. The patient agreed with the plan and demonstrated an understanding of the instructions.   The patient was advised to call back or seek an in-person evaluation if the symptoms worsen or if the condition fails to improve as anticipated.   Jodelle Green, FNP

## 2019-05-30 NOTE — Telephone Encounter (Signed)
Patient has called regarding her recent medication refill ad the price . She would like a call back as soon as possible regarding this issue    Call back [phone]

## 2019-06-13 ENCOUNTER — Ambulatory Visit
Admission: RE | Admit: 2019-06-13 | Discharge: 2019-06-13 | Disposition: A | Discharge status: Home / Self Care | Payer: Medicare HMO | Source: Ambulatory Visit | Attending: Family | Admitting: Family

## 2019-06-13 DIAGNOSIS — Z1231 Encounter for screening mammogram for malignant neoplasm of breast: Secondary | ICD-10-CM | POA: Insufficient documentation

## 2019-10-01 ENCOUNTER — Telehealth: Payer: Self-pay | Admitting: *Deleted

## 2019-10-01 NOTE — Telephone Encounter (Signed)
Left message for patient to notify them that it is time to schedule annual low dose lung cancer screening CT scan. Instructed patient to call back to verify information prior to the scan being scheduled.  

## 2019-10-04 ENCOUNTER — Telehealth: Payer: Self-pay | Admitting: *Deleted

## 2019-10-04 DIAGNOSIS — Z87891 Personal history of nicotine dependence: Secondary | ICD-10-CM

## 2019-10-04 NOTE — Telephone Encounter (Signed)
Patient has been notified that annual lung cancer screening low dose CT scan is due currently or will be in near future. Confirmed that patient is within the age range of 55-77, and asymptomatic, (no signs or symptoms of lung cancer). Patient denies illness that would prevent curative treatment for lung cancer if found. Verified smoking history, (former, quit 01/07/18, 30.75 pack year). The shared decision making visit was done 09/21/16. Patient is agreeable for CT scan being scheduled.

## 2019-10-11 ENCOUNTER — Ambulatory Visit
Admission: RE | Admit: 2019-10-11 | Discharge: 2019-10-11 | Disposition: A | Discharge status: Home / Self Care | Payer: Medicare Other | Source: Ambulatory Visit | Attending: Nurse Practitioner | Admitting: Nurse Practitioner

## 2019-10-11 ENCOUNTER — Other Ambulatory Visit: Payer: Self-pay

## 2019-10-11 DIAGNOSIS — Z87891 Personal history of nicotine dependence: Secondary | ICD-10-CM | POA: Diagnosis not present

## 2019-10-14 ENCOUNTER — Telehealth: Payer: Self-pay | Admitting: Family

## 2019-10-14 NOTE — Telephone Encounter (Signed)
Mail to patient   Ms Linda Schmitt, [place] you are doing well.   I received results from your annual CT lung scan from [facility] which showed benign findings of lungs. You will need do this again next year so please ensure you are contacted in regards to this and certainly call our office if you are not contacted for another test.   It was noted again on the exam that you underlying COPD and coronary artery atherosclerosis, or often referred to as hardening of the arteries.   This can put you at risk for stroke, heart attack.   Please make a follow up appointment with me so can continue to monitor your cholesterol and cholesterol medication as well as discuss , if necessary, further evaluation.   Look forward to seeing you!   Best,   [name], NP

## 2019-10-15 NOTE — Telephone Encounter (Signed)
Printed and mailed

## 2019-11-21 ENCOUNTER — Encounter (INDEPENDENT_AMBULATORY_CARE_PROVIDER_SITE_OTHER): Payer: Self-pay

## 2019-11-21 ENCOUNTER — Ambulatory Visit (INDEPENDENT_AMBULATORY_CARE_PROVIDER_SITE_OTHER): Payer: Medicare Other

## 2019-11-21 VITALS — Ht 63.0 in | Wt 230.0 lb

## 2019-11-21 DIAGNOSIS — Z780 Asymptomatic menopausal state: Secondary | ICD-10-CM | POA: Diagnosis not present

## 2019-11-21 DIAGNOSIS — Z1211 Encounter for screening for malignant neoplasm of colon: Secondary | ICD-10-CM | POA: Diagnosis not present

## 2019-11-21 DIAGNOSIS — Z0000 Encounter for general adult medical examination without abnormal findings: Secondary | ICD-10-CM

## 2019-11-21 DIAGNOSIS — Z1231 Encounter for screening mammogram for malignant neoplasm of breast: Secondary | ICD-10-CM

## 2019-11-21 NOTE — Progress Notes (Addendum)
Subjective:   Linda Schmitt is a 64 y.o. female who presents for an Initial Medicare Annual Wellness Visit.  Review of Systems    No ROS.  Medicare Wellness Virtual Visit.  Visual/audio telehealth visit, UTA vital signs.   Ht/Wt provided. See social history for additional risk factors.    Cardiac Risk Factors include: advanced age (>79men, >40 women)     Objective:    Today's Vitals   11/21/19 0942  Weight: 230 lb (104.3 kg)  Height: 5\' 3"  (1.6 m)   Body mass index is 40.74 kg/m.  Advanced Directives 11/21/2019  Does Patient Have a Medical Advance Directive? No  Would patient like information on creating a medical advance directive? Yes (MAU/Ambulatory/Procedural Areas - Information given)    Current Medications (verified) Outpatient Encounter Medications as of 11/21/2019  Medication Sig  . meloxicam (MOBIC) 7.5 MG tablet Take 1 tablet (7.5 mg total) by mouth daily as needed for pain. Take with food  . pravastatin (PRAVACHOL) 40 MG tablet TAKE 1 TABLET(40 MG) BY MOUTH DAILY  . [DISCONTINUED] erythromycin ophthalmic ointment Place 1 application into the left eye at bedtime. Do this for 7 nights   No facility-administered encounter medications on file as of 11/21/2019.    Allergies (verified) Patient has no known allergies.   History: History reviewed. No pertinent past medical history. Past Surgical History:  Procedure Laterality Date  . TUBAL LIGATION     Family History  Problem Relation Age of Onset  . Cancer Mother        lymphoma  . Stroke Sister   . Cancer Brother        lung  . Cancer Sister        lung?  . Breast cancer Cousin 51       maternal   Social History   Socioeconomic History  . Marital status: Single    Spouse name: Not on file  . Number of children: Not on file  . Years of education: Not on file  . Highest education level: Not on file  Occupational History  . Not on file  Tobacco Use  . Smoking status: Former Smoker   Packs/day: 0.75    Years: 40.00    Pack years: 30.00    Types: Cigarettes    Quit date: 02/06/2018    Years since quitting: 1.7  . Smokeless tobacco: Never Used  . Tobacco comment: quit 02/2018  Substance and Sexual Activity  . Alcohol use: Yes    Comment: 2-3 glasses of wine per week  . Drug use: No  . Sexual activity: Not on file  Other Topics Concern  . Not on file  Social History Narrative   Lives in [place] alone. Work - [employer]   Diet: Regular   Exercise: walking   Social Determinants of Radio broadcast assistant Strain:   . Difficulty of Paying Living Expenses:   Food Insecurity: No Landscape architect  . Worried About Charity fundraiser in the Last Year: Never true  . Ran Out of Food in the Last Year: Never true  Transportation Needs: No Transportation Needs  . Lack of Transportation (Medical): No  . Lack of Transportation (Non-Medical): No  Physical Activity:   . Days of Exercise per Week:   . Minutes of Exercise per Session:   Stress: No Stress Concern Present  . Feeling of Stress : Not at all  Social Connections:   . Frequency of Communication with Friends and Family:   .  Frequency of Social Gatherings with Friends and Family:   . Attends Religious Services:   . Active Member of Clubs or Organizations:   . Attends Archivist Meetings:   Marland Kitchen Marital Status:     Tobacco Counseling Counseling given: Not Answered Comment: quit 02/2018   Clinical Intake:  Pre-visit preparation completed: Yes        Diabetes: No  How often do you need to have someone help you when you read instructions, pamphlets, or other written materials from your doctor or pharmacy?: 1 - Never  Interpreter Needed?: No      Activities of Daily Living In your present state of health, do you have any difficulty performing the following activities: 11/21/2019 05/30/2019  Hearing? N N  Vision? N N  Difficulty concentrating or making decisions? N N  Walking or climbing  stairs? N N  Dressing or bathing? N N  Doing errands, shopping? N N  Preparing Food and eating ? N -  Using the Toilet? N -  In the past six months, have you accidently leaked urine? N -  Do you have problems with loss of bowel control? N -  Managing your Medications? N -  Managing your Finances? N -  Housekeeping or managing your Housekeeping? N -  Some recent data might be hidden     Immunizations and Health Maintenance Immunization History  Administered Date(s) Administered  . Tdap 12/24/2011   Health Maintenance Due  Topic Date Due  . DEXA SCAN  Never done  . PNA vac Low Risk Adult (1 of 2 - PCV13) Never done  . PAP SMEAR-Modifier  09/17/2019  . COLONOSCOPY  11/15/2019    Patient Care Team: Burnard Hawthorne, FNP as PCP - General (Family Medicine)  Indicate any recent Medical Services you may have received from other than [facility] providers in the past year (date may be approximate).     Assessment:   This is a routine wellness examination for [place].  Nurse connected with patient 11/21/19 at  9:30 AM EDT by a telephone enabled telemedicine application and verified that I am speaking with the correct person using two identifiers. Patient stated full name and DOB. Patient gave permission to continue with virtual visit. Patient's location was at home and Nurse's location was at [place] office.   Patient is alert and oriented x3. Patient denies difficulty focusing or concentrating.  Health Maintenance Due: -PNA vaccine- discussed; to be completed with doctor next visit per patient preference.  -Colonoscopy- ordered; consent given.  -Mammogram ordered per patient request.  -Dexa Scan - ordered; consent given.  -Pap Smear- due; plans to schedule later in the season.  -See completed HM at the end of note.   Eye: Visual acuity not assessed. Virtual visit. Followed by their ophthalmologist.  Dental: UTD  Hearing: Demonstrates normal hearing during visit.  Safety:    Patient feels safe at home- yes Patient does have smoke detectors at home- yes Patient does wear sunscreen or protective clothing when in direct sunlight - yes Patient does wear seat belt when in a moving vehicle - yes Patient drives- yes Adequate lighting in walkways free from debris- yes Grab bars and handrails used as appropriate- yes Ambulates with an assistive device- no Cell phone on person when ambulating outside of the home- yes  Social: Alcohol intake - yes      Smoking history- former   Smokers in home? none Illicit drug use? none  Medication: Taking as directed and without issues.  Self managed - yes   Covid-19: Precautions and sickness symptoms discussed. Wears mask, social distancing, hand hygiene as appropriate.   Activities of Daily Living Patient denies needing assistance with: household chores, feeding themselves, getting from bed to chair, getting to the toilet, bathing/showering, dressing, managing money, or preparing meals.   Discussed the importance of a healthy diet, water intake and the benefits of aerobic exercise.   Physical activity- walking, no routine. Active around the home. R shoulder outpatient physical therapy; continues PT exercises at home as directed.   Diet:  Low  cholesterol Water: good intake Caffeine: 1 cup of coffee  Other Providers Patient Care Team: Burnard Hawthorne, FNP as PCP - General (Family Medicine)  Hearing/Vision screen  Hearing Screening   125Hz  250Hz  500Hz  1000Hz  2000Hz  3000Hz  4000Hz  6000Hz  8000Hz   Right ear:           Left ear:           Comments: Patient is able to hear conversational tones without difficulty.  No issues reported.  Vision Screening Comments: Wears readers only Visual acuity not assessed, virtual visit.  They have seen their ophthalmologist.     Dietary issues and exercise activities discussed: Current Exercise Habits: Home exercise routine, Intensity: Mild  Goals      Patient Stated   .  I want to lose a little weight (pt-stated)     Healthy diet Stay hydrated Stay active      Depression Screen PHQ 2/9 Scores 11/21/2019 10/11/2016  PHQ - 2 Score 0 0    Fall Risk Fall Risk  11/21/2019 05/30/2019 10/11/2016  Falls in the past year? 1 1 No  Number falls in past yr: 0 0 -  Comment Missed a step when walking down the stair. - -  Injury with Fall? 1 - -  Comment R shoulder injury; sling currently worn. Sought medical advice with Urgent Care out of town. Followed by [facility]. - -  Follow up Falls evaluation completed Falls evaluation completed -   Timed Get Up and Go Performed no, virtual visit  Cognitive Function:     6CIT Screen 11/21/2019  What Year? 0 points  What month? 0 points  What time? 0 points  Count back from 20 0 points  Months in reverse 0 points  Repeat phrase 4 points  Total Score 4    Screening Tests Health Maintenance  Topic Date Due  . DEXA SCAN  Never done  . PNA vac Low Risk Adult (1 of 2 - PCV13) Never done  . PAP SMEAR-Modifier  09/17/2019  . COLONOSCOPY  11/15/2019  . MAMMOGRAM  06/12/2021  . TETANUS/TDAP  12/23/2021  . Hepatitis C Screening  Completed  . HIV Screening  Completed  . INFLUENZA VACCINE  Discontinued     Plan:   Keep all routine maintenance appointments.   Follow up 12/10/19 @ 9:00 per patient request. PNA vaccine. Discuss tingling/numbness L hand fingers.   Pap Smear to be scheduled at a later date per patient request.   Dexa Scan and Mammogram- ordered per patient request. [facility] to call and schedule Bone Density; schedule mammogram on same day as appropriate [phone].  Colonoscopy- ordered; consent given. Gastroenterology to call for scheduling.   Medicare Attestation I have personally reviewed: The patient's medical and social history Their use of alcohol, tobacco or illicit drugs Their current medications and supplements The patient's functional ability including ADLs,fall risks, home safety risks,  cognitive, and hearing and visual impairment Diet and  physical activities Evidence for depression   have reviewed and discussed with patient certain preventive protocols, quality metrics, and best practice recommendations.      Varney Biles, LPN   [id number]     Agree with plan. [name], NP

## 2019-11-21 NOTE — Patient Instructions (Addendum)
  Linda Schmitt , Thank you for taking time to come for your Medicare Wellness Visit. I appreciate your ongoing commitment to your health goals. Please review the following plan we discussed and let me know if I can assist you in the future.   These are the goals we discussed: Goals      Patient Stated   . I want to lose a little weight (pt-stated)     Healthy diet Stay hydrated Stay active       This is a list of the screening recommended for you and due dates:  Health Maintenance  Topic Date Due  . DEXA scan (bone density measurement)  Never done  . Pneumonia vaccines (1 of 2 - PCV13) Never done  . Pap Smear  09/17/2019  . Colon Cancer Screening  11/15/2019  . Mammogram  06/12/2021  . Tetanus Vaccine  12/23/2021  .  Hepatitis C: One time screening is recommended by Center for Disease Control  (CDC) for  adults born from [birth year] through 1967.   Completed  . HIV Screening  Completed  . Flu Shot  Discontinued

## 2019-11-22 ENCOUNTER — Telehealth: Payer: Self-pay

## 2019-11-22 NOTE — Telephone Encounter (Signed)
Patient is not ready to schedule colon procedure. Has a shoulder injury and prefers to wait until she heals.

## 2019-11-26 ENCOUNTER — Telehealth: Payer: Medicare Other

## 2019-11-27 ENCOUNTER — Other Ambulatory Visit: Payer: Self-pay | Admitting: Orthopedic Surgery

## 2019-11-27 DIAGNOSIS — S43014D Anterior dislocation of right humerus, subsequent encounter: Secondary | ICD-10-CM

## 2019-11-27 DIAGNOSIS — M25311 Other instability, right shoulder: Secondary | ICD-10-CM

## 2019-12-08 ENCOUNTER — Ambulatory Visit
Admission: RE | Admit: 2019-12-08 | Discharge: 2019-12-08 | Disposition: A | Discharge status: Home / Self Care | Payer: Medicare Other | Source: Ambulatory Visit | Attending: Orthopedic Surgery | Admitting: Orthopedic Surgery

## 2019-12-08 ENCOUNTER — Other Ambulatory Visit: Payer: Self-pay

## 2019-12-08 DIAGNOSIS — S43014D Anterior dislocation of right humerus, subsequent encounter: Secondary | ICD-10-CM | POA: Diagnosis not present

## 2019-12-08 DIAGNOSIS — M25311 Other instability, right shoulder: Secondary | ICD-10-CM | POA: Diagnosis present

## 2019-12-10 ENCOUNTER — Ambulatory Visit: Payer: Medicare Other | Admitting: Family

## 2019-12-12 ENCOUNTER — Ambulatory Visit: Payer: Medicare Other | Admitting: Family

## 2019-12-12 ENCOUNTER — Other Ambulatory Visit: Payer: Self-pay

## 2019-12-26 ENCOUNTER — Other Ambulatory Visit: Payer: Self-pay | Admitting: Orthopedic Surgery

## 2019-12-26 DIAGNOSIS — S43014D Anterior dislocation of right humerus, subsequent encounter: Secondary | ICD-10-CM

## 2019-12-28 ENCOUNTER — Ambulatory Visit (INDEPENDENT_AMBULATORY_CARE_PROVIDER_SITE_OTHER): Payer: Medicare Other | Admitting: Family

## 2019-12-28 ENCOUNTER — Telehealth: Payer: Self-pay | Admitting: Family

## 2019-12-28 ENCOUNTER — Other Ambulatory Visit: Payer: Self-pay

## 2019-12-28 ENCOUNTER — Encounter: Payer: Self-pay | Admitting: Family

## 2019-12-28 VITALS — BP 142/82 | HR 72 | Temp 96.7°F | Ht 63.0 in | Wt 232.8 lb

## 2019-12-28 DIAGNOSIS — R030 Elevated blood-pressure reading, without diagnosis of hypertension: Secondary | ICD-10-CM | POA: Diagnosis not present

## 2019-12-28 DIAGNOSIS — R202 Paresthesia of skin: Secondary | ICD-10-CM

## 2019-12-28 DIAGNOSIS — R7303 Prediabetes: Secondary | ICD-10-CM

## 2019-12-28 DIAGNOSIS — E785 Hyperlipidemia, unspecified: Secondary | ICD-10-CM | POA: Diagnosis not present

## 2019-12-28 LAB — COMPREHENSIVE METABOLIC PANEL
ALT: 17 U/L (ref 0–35)
AST: 18 U/L (ref 0–37)
Albumin: 4.4 g/dL (ref 3.5–5.2)
Alkaline Phosphatase: 54 U/L (ref 39–117)
BUN: 10 mg/dL (ref 6–23)
CO2: 30 mEq/L (ref 19–32)
Calcium: 9.6 mg/dL (ref 8.4–10.5)
Chloride: 103 mEq/L (ref 96–112)
Creatinine, Ser: 0.8 mg/dL (ref 0.40–1.20)
GFR: 86.82 mL/min (ref >60.00)
Glucose, Bld: 114 mg/dL — ABNORMAL HIGH (ref 70–99)
Potassium: 4.1 mEq/L (ref 3.5–5.1)
Sodium: 138 mEq/L (ref 135–145)
Total Bilirubin: 0.6 mg/dL (ref 0.2–1.2)
Total Protein: 6.8 g/dL (ref 6.0–8.3)

## 2019-12-28 LAB — LIPID PANEL
Cholesterol: 186 mg/dL (ref 0–200)
HDL: 63.6 mg/dL (ref >39.00)
LDL Cholesterol: 105 mg/dL — ABNORMAL HIGH (ref 0–99)
NonHDL: 122.53
Total CHOL/HDL Ratio: 3
Triglycerides: 90 mg/dL (ref 0.0–149.0)
VLDL: 18 mg/dL (ref 0.0–40.0)

## 2019-12-28 LAB — CBC WITH DIFFERENTIAL/PLATELET
Basophils Absolute: 0 10*3/uL (ref 0.0–0.1)
Basophils Relative: 0.5 % (ref 0.0–3.0)
Eosinophils Absolute: 0.2 10*3/uL (ref 0.0–0.7)
Eosinophils Relative: 3.2 % (ref 0.0–5.0)
HCT: 38 % (ref 36.0–46.0)
Hemoglobin: 12.6 g/dL (ref 12.0–15.0)
Lymphocytes Relative: 29.6 % (ref 12.0–46.0)
Lymphs Abs: 2 10*3/uL (ref 0.7–4.0)
MCHC: 33.2 g/dL (ref 30.0–36.0)
MCV: 88.8 fl (ref 78.0–100.0)
Monocytes Absolute: 0.6 10*3/uL (ref 0.1–1.0)
Monocytes Relative: 8.6 % (ref 3.0–12.0)
Neutro Abs: 3.9 10*3/uL (ref 1.4–7.7)
Neutrophils Relative %: 58.1 % (ref 43.0–77.0)
Platelets: 178 10*3/uL (ref 150.0–400.0)
RBC: 4.28 Mil/uL (ref 3.87–5.11)
RDW: 14.7 % (ref 11.5–15.5)
WBC: 6.7 10*3/uL (ref 4.0–10.5)

## 2019-12-28 LAB — TSH: TSH: 0.93 u[IU]/mL (ref 0.35–4.50)

## 2019-12-28 LAB — B12 AND FOLATE PANEL
Folate: 11.9 ng/mL (ref >5.9)
Vitamin B-12: 199 pg/mL — ABNORMAL LOW (ref 211–911)

## 2019-12-28 LAB — VITAMIN D 25 HYDROXY (VIT D DEFICIENCY, FRACTURES): VITD: 20.04 ng/mL — ABNORMAL LOW (ref 30.00–100.00)

## 2019-12-28 LAB — HEMOGLOBIN A1C: Hgb A1c MFr Bld: 6.1 % (ref 4.6–6.5)

## 2019-12-28 MED ORDER — GABAPENTIN 100 MG PO CAPS: 100.0000 mg | ORAL_CAPSULE | Freq: Three times a day (TID) | ORAL | 3 refills | Status: DC

## 2019-12-28 NOTE — Assessment & Plan Note (Signed)
Elevated, suspect from nsaid use and pain from right shoulder. Advised to limit nsaid use, trial gabapentin. Close followup.

## 2019-12-28 NOTE — Assessment & Plan Note (Signed)
Pending lipid panel. Continue pravastatin.

## 2019-12-28 NOTE — Patient Instructions (Addendum)
Limit use of ibuprofen , aleve, motrin, mobic  Use tyleonol arthritis  Start gabapentin  Over-the-counter medications you may try for arthritic pain include:   ThermaCare or SalonPas patches   Capsaicin cream   Icy hot or BioFreeze  Let me know how you are doing

## 2019-12-28 NOTE — Assessment & Plan Note (Signed)
Pending a1c. 

## 2019-12-28 NOTE — Telephone Encounter (Signed)
Left voicemail for pt to schedule 3 week follow up. She left without checking out.

## 2019-12-28 NOTE — Assessment & Plan Note (Signed)
Presentation doesn't support CTS. Pending labs. Trial of gabapentin. Close followup.

## 2019-12-28 NOTE — Progress Notes (Signed)
Subjective:    Patient ID: Linda Schmitt, female    DOB: 04-Nov-1955, 64 y.o.   MRN: [id number]  CC: Linda Schmitt is a 64 y.o. female who presents today for follow up.   HPI:   HLD- compliant with pravachal.  Never been on medication for blood pressure.   Taking mobic daily for shoulder pain. Has gained about 16 lbs since quit smoking.   Complains of bilateral hand tingling in fingertips for 2 weeks, comes and goes. Posterior neck pain.   H/o  Left CTS release.   Following with Dr Posey Pronto [facility] for right shoulder dislocation. Had been on tramadol for pain.   Right shoulder pain bothers her most at night.  HISTORY:  History reviewed. No pertinent past medical history. Past Surgical History:  Procedure Laterality Date  . TUBAL LIGATION     Family History  Problem Relation Age of Onset  . Cancer Mother        lymphoma  . Stroke Sister   . Cancer Brother        lung  . Cancer Sister        lung?  . Breast cancer Cousin 45       maternal    Allergies: Patient has no known allergies. Current Outpatient Medications on File Prior to Visit  Medication Sig Dispense Refill  . meloxicam (MOBIC) 7.5 MG tablet Take 1 tablet (7.5 mg total) by mouth daily as needed for pain. Take with food 90 tablet 0  . pravastatin (PRAVACHOL) 40 MG tablet TAKE 1 TABLET(40 MG) BY MOUTH DAILY 90 tablet 0   No current facility-administered medications on file prior to visit.    Social History   Tobacco Use  . Smoking status: Former Smoker    Packs/day: 0.75    Years: 40.00    Pack years: 30.00    Types: Cigarettes    Quit date: 02/06/2018    Years since quitting: 1.8  . Smokeless tobacco: Never Used  . Tobacco comment: quit 02/2018  Substance Use Topics  . Alcohol use: Yes    Comment: 2-3 glasses of wine per week  . Drug use: No    Review of Systems  Constitutional: Negative for chills and fever.  Respiratory: Negative for cough.   Cardiovascular: Negative for chest  pain and palpitations.  Gastrointestinal: Negative for nausea and vomiting.  Musculoskeletal: Positive for arthralgias (right shoulder) and neck pain.  Neurological: Positive for numbness (BL hands).      Objective:    BP (!) 142/82   Pulse 72   Temp (!) 96.7 F (35.9 C) (Temporal)   Ht 5\' 3"  (1.6 m)   Wt 232 lb 12.8 oz (105.6 kg)   SpO2 97%   BMI 41.24 kg/m  BP Readings from Last 3 Encounters:  12/28/19 (!) 142/82  04/21/18 130/78  01/13/18 129/76   Wt Readings from Last 3 Encounters:  12/28/19 232 lb 12.8 oz (105.6 kg)  11/21/19 230 lb (104.3 kg)  10/11/19 230 lb (104.3 kg)    Physical Exam Vitals reviewed.  Constitutional:      Appearance: She is well-developed.  Eyes:     Conjunctiva/sclera: Conjunctivae normal.  Cardiovascular:     Rate and Rhythm: Normal rate and regular rhythm.     Pulses: Normal pulses.     Heart sounds: Normal heart sounds.  Pulmonary:     Effort: Pulmonary effort is normal.     Breath sounds: Normal breath sounds. No wheezing, rhonchi or rales.  Musculoskeletal:     Right hand: Normal. No swelling. Normal strength. Normal sensation. Normal capillary refill. Normal pulse.     Left hand: Normal. No swelling. Normal strength. Normal sensation. Normal capillary refill. Normal pulse.     Cervical back: Normal. No bony tenderness. No pain with movement.  Skin:    General: Skin is warm and dry.  Neurological:     Mental Status: She is alert.  Psychiatric:        Speech: Speech normal.        Behavior: Behavior normal.        Thought Content: Thought content normal.        Assessment & Plan:   Problem List Items Addressed This Visit      Other   Elevated blood pressure reading    Elevated, suspect from nsaid use and pain from right shoulder. Advised to limit nsaid use, trial gabapentin. Close followup.       Relevant Orders   Comprehensive metabolic panel   Hemoglobin A1c   Lipid panel   VITAMIN D 25 Hydroxy (Vit-D Deficiency,  Fractures)   Hand tingling    Presentation doesn't support CTS. Pending labs. Trial of gabapentin. Close followup.       Relevant Medications   gabapentin (NEURONTIN) 100 MG capsule   Other Relevant Orders   TSH   CBC with Differential/Platelet   Comprehensive metabolic panel   VITAMIN D 25 Hydroxy (Vit-D Deficiency, Fractures)   B12 and Folate Panel   Hyperlipidemia - Primary    Pending lipid panel. Continue pravastatin.       Relevant Orders   Lipid panel   VITAMIN D 25 Hydroxy (Vit-D Deficiency, Fractures)   Prediabetes    Pending a1c.          I am having Danny Lawless. Dieguez start on gabapentin. I am also having her maintain her pravastatin and meloxicam.   Meds ordered this encounter  Medications  . gabapentin (NEURONTIN) 100 MG capsule    Sig: Take 1 capsule (100 mg total) by mouth 3 (three) times daily.    Dispense:  90 capsule    Refill:  3    Order Specific Question:   Supervising Provider    Answer:   Crecencio Mc [[id number]]    Return precautions given.   Risks, benefits, and alternatives of the medications and treatment plan prescribed today were discussed, and patient expressed understanding.   Education regarding symptom management and diagnosis given to patient on AVS.  Continue to follow with Burnard Hawthorne, FNP for routine health maintenance.   [place] and I agreed with plan.   [name], FNP

## 2019-12-31 ENCOUNTER — Other Ambulatory Visit: Payer: Self-pay

## 2019-12-31 ENCOUNTER — Other Ambulatory Visit: Payer: Self-pay | Admitting: Family

## 2019-12-31 DIAGNOSIS — E538 Deficiency of other specified B group vitamins: Secondary | ICD-10-CM

## 2019-12-31 DIAGNOSIS — E785 Hyperlipidemia, unspecified: Secondary | ICD-10-CM

## 2019-12-31 MED ORDER — PRAVASTATIN SODIUM 40 MG PO TABS: ORAL_TABLET | ORAL | 0 refills | Status: DC

## 2020-01-01 ENCOUNTER — Telehealth: Payer: Self-pay | Admitting: Family

## 2020-01-01 ENCOUNTER — Other Ambulatory Visit: Payer: Self-pay

## 2020-01-01 NOTE — Telephone Encounter (Signed)
Patient called & clarified what vitamin D to take.

## 2020-01-01 NOTE — Telephone Encounter (Signed)
Pt called said someone called and was saying she need to purchase some vitamins

## 2020-01-08 ENCOUNTER — Other Ambulatory Visit: Payer: Self-pay

## 2020-01-08 ENCOUNTER — Other Ambulatory Visit (INDEPENDENT_AMBULATORY_CARE_PROVIDER_SITE_OTHER): Payer: Medicare Other

## 2020-01-08 ENCOUNTER — Ambulatory Visit (INDEPENDENT_AMBULATORY_CARE_PROVIDER_SITE_OTHER): Payer: Medicare Other

## 2020-01-08 DIAGNOSIS — E538 Deficiency of other specified B group vitamins: Secondary | ICD-10-CM

## 2020-01-08 MED ORDER — CYANOCOBALAMIN 1000 MCG/ML IJ SOLN
1000.0000 ug | Freq: Once | INTRAMUSCULAR | Status: AC
  Administered 2020-01-08: 1000 ug via INTRAMUSCULAR

## 2020-01-08 MED ORDER — CYANOCOBALAMIN 1000 MCG/ML IJ SOLN: 1000.0000 ug | Freq: Once | INTRAMUSCULAR | Status: DC

## 2020-01-08 NOTE — Progress Notes (Signed)
Patient presented for B 12 injection to left deltoid, patient voiced no concerns nor showed any signs of distress during injection. 

## 2020-01-09 ENCOUNTER — Ambulatory Visit
Admission: RE | Admit: 2020-01-09 | Discharge: 2020-01-09 | Disposition: A | Discharge status: Home / Self Care | Payer: Medicare Other | Source: Ambulatory Visit | Attending: Orthopedic Surgery | Admitting: Orthopedic Surgery

## 2020-01-09 ENCOUNTER — Other Ambulatory Visit: Payer: Self-pay

## 2020-01-09 DIAGNOSIS — S43014D Anterior dislocation of right humerus, subsequent encounter: Secondary | ICD-10-CM | POA: Diagnosis present

## 2020-01-11 LAB — INTRINSIC FACTOR ANTIBODIES: Intrinsic Factor: NEGATIVE

## 2020-01-15 ENCOUNTER — Other Ambulatory Visit: Payer: Self-pay

## 2020-01-15 ENCOUNTER — Ambulatory Visit (INDEPENDENT_AMBULATORY_CARE_PROVIDER_SITE_OTHER): Payer: Medicare Other

## 2020-01-15 DIAGNOSIS — E538 Deficiency of other specified B group vitamins: Secondary | ICD-10-CM

## 2020-01-15 MED ORDER — CYANOCOBALAMIN 1000 MCG/ML IJ SOLN
1000.0000 ug | Freq: Once | INTRAMUSCULAR | Status: AC
  Administered 2020-01-15: 1000 ug via INTRAMUSCULAR

## 2020-01-15 NOTE — Progress Notes (Signed)
Patient presented for B 12 injection to left deltoid, patient voiced no concerns nor showed any signs of distress during injection. 

## 2020-01-22 ENCOUNTER — Other Ambulatory Visit: Payer: Self-pay

## 2020-01-22 ENCOUNTER — Ambulatory Visit (INDEPENDENT_AMBULATORY_CARE_PROVIDER_SITE_OTHER): Payer: Medicare Other | Admitting: Family

## 2020-01-22 ENCOUNTER — Telehealth: Payer: Self-pay | Admitting: Family

## 2020-01-22 VITALS — BP 122/78 | HR 66 | Temp 97.2°F | Resp 16 | Wt 233.0 lb

## 2020-01-22 DIAGNOSIS — R202 Paresthesia of skin: Secondary | ICD-10-CM

## 2020-01-22 DIAGNOSIS — R2231 Localized swelling, mass and lump, right upper limb: Secondary | ICD-10-CM | POA: Diagnosis not present

## 2020-01-22 DIAGNOSIS — R030 Elevated blood-pressure reading, without diagnosis of hypertension: Secondary | ICD-10-CM

## 2020-01-22 DIAGNOSIS — E785 Hyperlipidemia, unspecified: Secondary | ICD-10-CM | POA: Diagnosis not present

## 2020-01-22 DIAGNOSIS — D3502 Benign neoplasm of left adrenal gland: Secondary | ICD-10-CM

## 2020-01-22 DIAGNOSIS — E538 Deficiency of other specified B group vitamins: Secondary | ICD-10-CM | POA: Diagnosis not present

## 2020-01-22 MED ORDER — CYANOCOBALAMIN 1000 MCG/ML IJ SOLN
1000.0000 ug | Freq: Once | INTRAMUSCULAR | Status: AC
  Administered 2020-01-22: 1000 ug via INTRAMUSCULAR

## 2020-01-22 NOTE — Progress Notes (Signed)
Subjective:    Patient ID: Linda Schmitt, female    DOB: Sep 09, 1955, 64 y.o.   MRN: [id number]  CC: Linda Schmitt is a 64 y.o. female who presents today for follow up.   HPI: Complains of right medial arm lump noticed last week , unchanged. Not painful however when flexes her arm, she is 'aware' that is there, however feeling that is there is occasional as well.   No rash, purulent discharge, tingling,numbness, bug/tick bites.   HLD- hasnt been taking pravastatin 40mg  daily; declines increasing to60mg .   Hand tingling- unchanged, taking gabapentin 100mg  qhs.   b12 deficiency- compliant with injections.    Folowing with Dr Posey Pronto for right shoulder injury/ dislocation HISTORY:  No past medical history on file. Past Surgical History:  Procedure Laterality Date  . TUBAL LIGATION     Family History  Problem Relation Age of Onset  . Cancer Mother        lymphoma  . Stroke Sister   . Cancer Brother        lung  . Cancer Sister        lung?  . Breast cancer Cousin 45       maternal    Allergies: Patient has no known allergies. Current Outpatient Medications on File Prior to Visit  Medication Sig Dispense Refill  . gabapentin (NEURONTIN) 100 MG capsule Take 1 capsule (100 mg total) by mouth 3 (three) times daily. 90 capsule 3  . meloxicam (MOBIC) 7.5 MG tablet Take 1 tablet (7.5 mg total) by mouth daily as needed for pain. Take with food 90 tablet 0  . pravastatin (PRAVACHOL) 40 MG tablet TAKE 1 TABLET(40 MG) BY MOUTH DAILY 90 tablet 0   No current facility-administered medications on file prior to visit.    Social History   Tobacco Use  . Smoking status: Former Smoker    Packs/day: 0.75    Years: 40.00    Pack years: 30.00    Types: Cigarettes    Quit date: 02/06/2018    Years since quitting: 1.9  . Smokeless tobacco: Never Used  . Tobacco comment: quit 02/2018  Vaping Use  . Vaping Use: Never used  Substance Use Topics  . Alcohol use: Yes    Comment: 2-3  glasses of wine per week  . Drug use: No    Review of Systems  Constitutional: Negative for chills and fever.  Respiratory: Negative for cough.   Cardiovascular: Negative for chest pain and palpitations.  Gastrointestinal: Negative for nausea and vomiting.  Skin: Negative for rash and wound.  Hematological: Negative for adenopathy.      Objective:    BP 122/78   Pulse 66   Temp (!) 97.2 F (36.2 C)   Resp 16   Wt 233 lb (105.7 kg)   SpO2 98%   BMI 41.27 kg/m  BP Readings from Last 3 Encounters:  01/22/20 122/78  12/28/19 (!) 142/82  04/21/18 130/78   Wt Readings from Last 3 Encounters:  01/22/20 233 lb (105.7 kg)  12/28/19 232 lb 12.8 oz (105.6 kg)  11/21/19 230 lb (104.3 kg)    Physical Exam Vitals reviewed.  Constitutional:      Appearance: She is well-developed.  Eyes:     Conjunctiva/sclera: Conjunctivae normal.  Neck:     Thyroid: No thyroid mass or thyromegaly.  Cardiovascular:     Rate and Rhythm: Normal rate and regular rhythm.     Pulses: Normal pulses.     Heart  sounds: Normal heart sounds.  Pulmonary:     Effort: Pulmonary effort is normal.     Breath sounds: Normal breath sounds. No wheezing, rhonchi or rales.  Lymphadenopathy:     Head:     Right side of head: No submental, submandibular, tonsillar, preauricular, posterior auricular or occipital adenopathy.     Left side of head: No submental, submandibular, tonsillar, preauricular, posterior auricular or occipital adenopathy.     Cervical: No cervical adenopathy.     Right cervical: No superficial, deep or posterior cervical adenopathy.    Left cervical: No superficial, deep or posterior cervical adenopathy.     Upper Body:     Right upper body: No supraclavicular, axillary or epitrochlear adenopathy.     Left upper body: No supraclavicular, axillary or epitrochlear adenopathy.  Skin:    General: Skin is warm and dry.          Comments: Assymmetric area of skin when compared to left  lateral forearm. Not circumscribed. No erythema, increased heat, fluctuance  Neurological:     Mental Status: She is alert.  Psychiatric:        Speech: Speech normal.        Behavior: Behavior normal.        Thought Content: Thought content normal.        Assessment & Plan:   Problem List Items Addressed This Visit      Endocrine   Adrenal adenoma, left    Never had consult with endocrine. Call out to patient to re-engage        Other   Elevated blood pressure reading    resolved      Hyperlipidemia    Deferred increasing pravastatin as patient had not been consistent with medication.  She will remain consistent and we will recheck lipid panel at follow-up      Mass of arm, right    Etiology unclear at this time.  Does not appear infectious. ? Lipoma. Pending soft tissue US.  Advised patient to remain vigilant for any new symptoms      Relevant Orders   [place] RT UPPER EXTREM LTD SOFT TISSUE NON VASCULAR    Other Visit Diagnoses    B12 deficiency    -  Primary   Relevant Medications   cyanocobalamin ((VITAMIN B-12)) injection 1,000 mcg (Completed)       I am having Linda Schmitt maintain her meloxicam, gabapentin, and pravastatin. We administered cyanocobalamin.   Meds ordered this encounter  Medications  . cyanocobalamin ((VITAMIN B-12)) injection 1,000 mcg    Return precautions given.   Risks, benefits, and alternatives of the medications and treatment plan prescribed today were discussed, and patient expressed understanding.   Education regarding symptom management and diagnosis given to patient on AVS.  Continue to follow with Burnard Hawthorne, FNP for routine health maintenance.   [place] and I agreed with plan.   [name], FNP

## 2020-01-22 NOTE — Patient Instructions (Addendum)
Stay consistent with pravachol and we will recheck your cholesterol in a couple of months.   Ordered ultrasound of right arm Let us know if you dont hear back within a week in regards to an appointment being scheduled.  Let me know of any new symptoms, concerns in regards to your arm.   Nice to see you!

## 2020-01-22 NOTE — Assessment & Plan Note (Signed)
Deferred increasing pravastatin as patient had not been consistent with medication.  She will remain consistent and we will recheck lipid panel at follow-up

## 2020-01-22 NOTE — Assessment & Plan Note (Signed)
Never had consult with endocrine. Call out to patient to re-engage

## 2020-01-22 NOTE — Assessment & Plan Note (Signed)
Etiology unclear at this time.  Does not appear infectious. ? Lipoma. Pending soft tissue US.  Advised patient to remain vigilant for any new symptoms

## 2020-01-22 NOTE — Assessment & Plan Note (Signed)
resolved 

## 2020-01-22 NOTE — Telephone Encounter (Signed)
Call pt Reviewing chart She didn't ever have consult with endocrine last year due to h/o adrenal adenoma Please emphasize the importance of surveillance here  I can replace referral if she agrees

## 2020-01-23 ENCOUNTER — Ambulatory Visit: Payer: Medicare Other

## 2020-01-24 ENCOUNTER — Other Ambulatory Visit: Payer: Self-pay | Admitting: Orthopedic Surgery

## 2020-01-25 NOTE — Telephone Encounter (Signed)
Left a message to call back.

## 2020-01-28 ENCOUNTER — Other Ambulatory Visit: Payer: Self-pay

## 2020-01-28 ENCOUNTER — Ambulatory Visit
Admission: RE | Admit: 2020-01-28 | Discharge: 2020-01-28 | Disposition: A | Discharge status: Home / Self Care | Payer: Medicare Other | Source: Ambulatory Visit | Attending: Family | Admitting: Family

## 2020-01-28 DIAGNOSIS — R2231 Localized swelling, mass and lump, right upper limb: Secondary | ICD-10-CM | POA: Insufficient documentation

## 2020-01-29 ENCOUNTER — Encounter: Payer: Self-pay | Admitting: Family

## 2020-01-29 ENCOUNTER — Other Ambulatory Visit: Payer: Self-pay | Admitting: Orthopedic Surgery

## 2020-01-29 ENCOUNTER — Encounter
Admission: RE | Admit: 2020-01-29 | Discharge: 2020-01-29 | Disposition: A | Discharge status: Home / Self Care | Payer: Medicare Other | Source: Ambulatory Visit | Attending: Orthopedic Surgery | Admitting: Orthopedic Surgery

## 2020-01-29 HISTORY — DX: Hyperlipidemia, unspecified: E78.5

## 2020-01-29 NOTE — Patient Instructions (Signed)
Your procedure is scheduled on: 02/01/20 Report to [facility]. To find out your arrival time please call [phone] between 1PM - 3PM on 01/31/20.  Remember: Instructions that are not followed completely may result in serious medical risk, up to and including death, or upon the discretion of your surgeon and anesthesiologist your surgery may need to be rescheduled.     _X__ 1. Do not eat food after midnight the night before your procedure.                 No gum chewing or hard candies. You may drink clear liquids up to 2 hours                 before you are scheduled to arrive for your surgery- DO not drink clear                 liquids within 2 hours of the start of your surgery.                 Clear Liquids include:  water, apple juice without pulp, clear carbohydrate                 drink such as Clearfast or Gatorade, Black Coffee or Tea (Do not add                 anything to coffee or tea). Diabetics water only  __X__2.  On the morning of surgery brush your teeth with toothpaste and water, you                 may rinse your mouth with mouthwash if you wish.  Do not swallow any              toothpaste of mouthwash.     _X__ 3.  No Alcohol for 24 hours before or after surgery.   _X__ 4.  Do Not Smoke or use e-cigarettes For 24 Hours Prior to Your Surgery.                 Do not use any chewable tobacco products for at least 6 hours prior to                 surgery.  ____  5.  Bring all medications with you on the day of surgery if instructed.   __X__  6.  Notify your doctor if there is any change in your medical condition      (cold, fever, infections).     Do not wear jewelry, make-up, hairpins, clips or nail polish. Do not wear lotions, powders, or perfumes.  Do not shave 48 hours prior to surgery. Men may shave face and neck. Do not bring valuables to the hospital.    [facility] is not responsible for any belongings or  valuables.  Contacts, dentures/partials or body piercings may not be worn into surgery. Bring a case for your contacts, glasses or hearing aids, a denture cup will be supplied. Leave your suitcase in the car. After surgery it may be brought to your room. For patients admitted to the hospital, discharge time is determined by your treatment team.   Patients discharged the day of surgery will not be allowed to drive home.   Please read over the following fact sheets that you were given:   MRSA Information  __X__ Take these medicines the morning of surgery with A SIP OF WATER:  1. none  2.   3.   4.  5.  6.  ____ Fleet Enema (as directed)   __X__ Use CHG Soap/SAGE wipes as directed  ____ Use inhalers on the day of surgery  ____ Stop metformin/Janumet/Farxiga 2 days prior to surgery    ____ Take 1/2 of usual insulin dose the night before surgery. No insulin the morning          of surgery.   ____ Stop Blood Thinners Coumadin/Plavix/Xarelto/Pleta/Pradaxa/Eliquis/Effient/Aspirin  on   Or contact your Surgeon, Cardiologist or Medical Doctor regarding  ability to stop your blood thinners  __X__ Stop Anti-inflammatories 7 days before surgery such as Advil, Ibuprofen, Motrin,  BC or Goodies Powder, Naprosyn, Naproxen, Aleve, Aspirin    __X__ Stop all herbal supplements, fish oil or vitamin E until after surgery.    ____ Bring C-Pap to the hospital.

## 2020-01-30 ENCOUNTER — Ambulatory Visit (INDEPENDENT_AMBULATORY_CARE_PROVIDER_SITE_OTHER): Payer: Medicare Other

## 2020-01-30 ENCOUNTER — Encounter
Admission: RE | Admit: 2020-01-30 | Discharge: 2020-01-30 | Disposition: A | Discharge status: Home / Self Care | Payer: Medicare Other | Source: Ambulatory Visit | Attending: Orthopedic Surgery | Admitting: Orthopedic Surgery

## 2020-01-30 ENCOUNTER — Other Ambulatory Visit: Admission: RE | Admit: 2020-01-30 | Payer: Medicare Other | Source: Ambulatory Visit

## 2020-01-30 ENCOUNTER — Encounter: Payer: Self-pay | Admitting: Urgent Care

## 2020-01-30 ENCOUNTER — Other Ambulatory Visit: Payer: Self-pay

## 2020-01-30 DIAGNOSIS — Z20822 Contact with and (suspected) exposure to covid-19: Secondary | ICD-10-CM | POA: Diagnosis not present

## 2020-01-30 DIAGNOSIS — Z01818 Encounter for other preprocedural examination: Secondary | ICD-10-CM | POA: Insufficient documentation

## 2020-01-30 DIAGNOSIS — E538 Deficiency of other specified B group vitamins: Secondary | ICD-10-CM | POA: Diagnosis not present

## 2020-01-30 LAB — SARS CORONAVIRUS 2 (TAT 6-24 HRS): SARS Coronavirus 2: NEGATIVE

## 2020-01-30 MED ORDER — CYANOCOBALAMIN 1000 MCG/ML IJ SOLN
1000.0000 ug | Freq: Once | INTRAMUSCULAR | Status: AC
  Administered 2020-01-30: 1000 ug via INTRAMUSCULAR

## 2020-01-30 NOTE — Progress Notes (Signed)
Patient presented for B 12 injection to left deltoid, patient voiced no concerns nor showed any signs of distress during injection.    Patient would also like a call to inform her if she is to do B12 shot monthly now or what is the next step.

## 2020-01-31 MED ORDER — CEFAZOLIN SODIUM-DEXTROSE 2-4 GM/100ML-% IV SOLN
2.0000 g | INTRAVENOUS | Status: AC
  Administered 2020-02-01: 2 g via INTRAVENOUS

## 2020-01-31 NOTE — Telephone Encounter (Signed)
Spoke with the Patient and she states that she has surgery 6 am tomorrow. With all that is going on she declines the consultation. States that in 6 weeks she will be recovered from her surgery and is willing to initiate the referral.   Patient had the last of her 4 weeks B12 injection. She states that she will be unable to drive for 6 weeks so will be unable to return for the first monthly injection. Patient wanting to know what she can take at home to supplement her missing the first monthly injection. Please advise

## 2020-02-01 ENCOUNTER — Encounter: Payer: Self-pay | Admitting: Orthopedic Surgery

## 2020-02-01 ENCOUNTER — Ambulatory Visit: Payer: Medicare Other | Admitting: Anesthesiology

## 2020-02-01 ENCOUNTER — Ambulatory Visit: Payer: Medicare Other

## 2020-02-01 ENCOUNTER — Other Ambulatory Visit: Payer: Self-pay

## 2020-02-01 ENCOUNTER — Ambulatory Visit
Admission: RE | Admit: 2020-02-01 | Discharge: 2020-02-01 | Disposition: A | Discharge status: Home / Self Care | Payer: Medicare Other | Attending: Orthopedic Surgery | Admitting: Orthopedic Surgery

## 2020-02-01 ENCOUNTER — Encounter
Admission: RE | Disposition: A | Discharge status: Home / Self Care | Payer: Self-pay | Source: Home / Self Care | Attending: Orthopedic Surgery

## 2020-02-01 DIAGNOSIS — R7303 Prediabetes: Secondary | ICD-10-CM | POA: Insufficient documentation

## 2020-02-01 DIAGNOSIS — Z419 Encounter for procedure for purposes other than remedying health state, unspecified: Secondary | ICD-10-CM

## 2020-02-01 DIAGNOSIS — Z87891 Personal history of nicotine dependence: Secondary | ICD-10-CM | POA: Insufficient documentation

## 2020-02-01 DIAGNOSIS — M75101 Unspecified rotator cuff tear or rupture of right shoulder, not specified as traumatic: Secondary | ICD-10-CM | POA: Diagnosis not present

## 2020-02-01 DIAGNOSIS — Z79899 Other long term (current) drug therapy: Secondary | ICD-10-CM | POA: Diagnosis not present

## 2020-02-01 DIAGNOSIS — Z833 Family history of diabetes mellitus: Secondary | ICD-10-CM | POA: Diagnosis not present

## 2020-02-01 DIAGNOSIS — E785 Hyperlipidemia, unspecified: Secondary | ICD-10-CM | POA: Insufficient documentation

## 2020-02-01 HISTORY — PX: SHOULDER ARTHROSCOPY WITH SUBACROMIAL DECOMPRESSION AND OPEN ROTATOR C: SHX5688

## 2020-02-01 SURGERY — SHOULDER ARTHROSCOPY WITH SUBACROMIAL DECOMPRESSION AND OPEN ROTATOR CUFF REPAIR, OPEN BICEPS TENDON REPAIR
Anesthesia: General | Site: Shoulder | Laterality: Right

## 2020-02-01 MED ORDER — LIDOCAINE-EPINEPHRINE 1 %-1:100000 IJ SOLN
INTRAMUSCULAR | Status: AC
  Filled 2020-02-01: qty 1

## 2020-02-01 MED ORDER — LIDOCAINE HCL (CARDIAC) PF 100 MG/5ML IV SOSY
PREFILLED_SYRINGE | INTRAVENOUS | Status: DC | PRN
  Administered 2020-02-01: 100 mg via INTRAVENOUS

## 2020-02-01 MED ORDER — LIDOCAINE HCL (PF) 2 % IJ SOLN
INTRAMUSCULAR | Status: AC
  Filled 2020-02-01: qty 5

## 2020-02-01 MED ORDER — ROCURONIUM BROMIDE 100 MG/10ML IV SOLN
INTRAVENOUS | Status: DC | PRN
  Administered 2020-02-01: 80 mg via INTRAVENOUS

## 2020-02-01 MED ORDER — ONDANSETRON 4 MG PO TBDP
4.0000 mg | ORAL_TABLET | Freq: Three times a day (TID) | ORAL | 0 refills | Status: DC | PRN
Start: 2020-02-01 — End: 2020-03-04

## 2020-02-01 MED ORDER — PROPOFOL 10 MG/ML IV BOLUS
INTRAVENOUS | Status: DC | PRN
  Administered 2020-02-01: 170 mg via INTRAVENOUS

## 2020-02-01 MED ORDER — CHLORHEXIDINE GLUCONATE 0.12 % MT SOLN: 15.0000 mL | Freq: Once | OROMUCOSAL | Status: AC

## 2020-02-01 MED ORDER — OXYCODONE HCL 5 MG PO TABS: 5.0000 mg | ORAL_TABLET | ORAL | 0 refills | Status: DC | PRN

## 2020-02-01 MED ORDER — ACETAMINOPHEN 10 MG/ML IV SOLN
INTRAVENOUS | Status: AC
  Filled 2020-02-01: qty 100

## 2020-02-01 MED ORDER — MIDAZOLAM HCL 2 MG/2ML IJ SOLN
INTRAMUSCULAR | Status: AC
  Administered 2020-02-01: 1 mg via INTRAVENOUS
  Filled 2020-02-01: qty 2

## 2020-02-01 MED ORDER — FENTANYL CITRATE (PF) 100 MCG/2ML IJ SOLN
INTRAMUSCULAR | Status: AC
  Administered 2020-02-01: 50 ug via INTRAVENOUS
  Filled 2020-02-01: qty 2

## 2020-02-01 MED ORDER — FAMOTIDINE 20 MG PO TABS: 20.0000 mg | ORAL_TABLET | Freq: Once | ORAL | Status: AC

## 2020-02-01 MED ORDER — ONDANSETRON HCL 4 MG/2ML IJ SOLN: 4.0000 mg | Freq: Once | INTRAMUSCULAR | Status: DC | PRN

## 2020-02-01 MED ORDER — SUCCINYLCHOLINE CHLORIDE 200 MG/10ML IV SOSY
PREFILLED_SYRINGE | INTRAVENOUS | Status: AC
  Filled 2020-02-01: qty 10

## 2020-02-01 MED ORDER — CEFAZOLIN SODIUM-DEXTROSE 2-4 GM/100ML-% IV SOLN
INTRAVENOUS | Status: AC
  Filled 2020-02-01: qty 100

## 2020-02-01 MED ORDER — EPINEPHRINE PF 1 MG/ML IJ SOLN
INTRAMUSCULAR | Status: AC
  Filled 2020-02-01: qty 4

## 2020-02-01 MED ORDER — GLYCOPYRROLATE 0.2 MG/ML IJ SOLN
INTRAMUSCULAR | Status: DC | PRN
  Administered 2020-02-01: .2 mg via INTRAVENOUS

## 2020-02-01 MED ORDER — BUPIVACAINE LIPOSOME 1.3 % IJ SUSP
INTRAMUSCULAR | Status: AC
  Filled 2020-02-01: qty 20

## 2020-02-01 MED ORDER — CYANOCOBALAMIN 1000 MCG PO CAPS: 1.0000 | ORAL_CAPSULE | Freq: Every day | ORAL | 3 refills | Status: AC

## 2020-02-01 MED ORDER — PHENYLEPHRINE HCL-NACL 20-0.9 MG/250ML-% IV SOLN
INTRAVENOUS | Status: DC | PRN
  Administered 2020-02-01: 50 ug/min via INTRAVENOUS

## 2020-02-01 MED ORDER — OXYCODONE HCL 5 MG/5ML PO SOLN: 5.0000 mg | Freq: Once | ORAL | Status: DC | PRN

## 2020-02-01 MED ORDER — LACTATED RINGERS IV SOLN: INTRAVENOUS | Status: DC | PRN

## 2020-02-01 MED ORDER — ROCURONIUM BROMIDE 10 MG/ML (PF) SYRINGE
PREFILLED_SYRINGE | INTRAVENOUS | Status: AC
  Filled 2020-02-01: qty 10

## 2020-02-01 MED ORDER — EPHEDRINE 5 MG/ML INJ
INTRAVENOUS | Status: AC
  Filled 2020-02-01: qty 10

## 2020-02-01 MED ORDER — CHLORHEXIDINE GLUCONATE 0.12 % MT SOLN
OROMUCOSAL | Status: AC
  Administered 2020-02-01: 15 mL via OROMUCOSAL
  Filled 2020-02-01: qty 15

## 2020-02-01 MED ORDER — OXYCODONE HCL 5 MG PO TABS: 5.0000 mg | ORAL_TABLET | Freq: Once | ORAL | Status: DC | PRN

## 2020-02-01 MED ORDER — DEXAMETHASONE SODIUM PHOSPHATE 10 MG/ML IJ SOLN
INTRAMUSCULAR | Status: AC
  Filled 2020-02-01: qty 1

## 2020-02-01 MED ORDER — PHENYLEPHRINE HCL (PRESSORS) 10 MG/ML IV SOLN
INTRAVENOUS | Status: AC
  Filled 2020-02-01: qty 1

## 2020-02-01 MED ORDER — PHENYLEPHRINE HCL (PRESSORS) 10 MG/ML IV SOLN
INTRAVENOUS | Status: DC | PRN
  Administered 2020-02-01: 100 ug via INTRAVENOUS
  Administered 2020-02-01: 150 ug via INTRAVENOUS

## 2020-02-01 MED ORDER — FAMOTIDINE 20 MG PO TABS
ORAL_TABLET | ORAL | Status: AC
  Administered 2020-02-01: 20 mg via ORAL
  Filled 2020-02-01: qty 1

## 2020-02-01 MED ORDER — ASPIRIN EC 325 MG PO TBEC
325.0000 mg | DELAYED_RELEASE_TABLET | Freq: Every day | ORAL | 0 refills | Status: AC
Start: 2020-02-01 — End: 2020-02-15

## 2020-02-01 MED ORDER — ONDANSETRON HCL 4 MG/2ML IJ SOLN
INTRAMUSCULAR | Status: AC
  Filled 2020-02-01: qty 2

## 2020-02-01 MED ORDER — ACETAMINOPHEN 500 MG PO TABS
1000.0000 mg | ORAL_TABLET | Freq: Three times a day (TID) | ORAL | 2 refills | Status: AC
Start: 2020-02-01 — End: 2021-01-31

## 2020-02-01 MED ORDER — DEXAMETHASONE SODIUM PHOSPHATE 10 MG/ML IJ SOLN
INTRAMUSCULAR | Status: DC | PRN
  Administered 2020-02-01: 10 mg via INTRAVENOUS

## 2020-02-01 MED ORDER — CEFAZOLIN SODIUM-DEXTROSE 2-4 GM/100ML-% IV SOLN: 2.0000 g | INTRAVENOUS | Status: DC

## 2020-02-01 MED ORDER — ONDANSETRON HCL 4 MG/2ML IJ SOLN
INTRAMUSCULAR | Status: DC | PRN
  Administered 2020-02-01: 4 mg via INTRAVENOUS

## 2020-02-01 MED ORDER — BUPIVACAINE LIPOSOME 1.3 % IJ SUSP
INTRAMUSCULAR | Status: DC | PRN
Start: 2020-02-01 — End: 2020-02-01
  Administered 2020-02-01: 15 mL

## 2020-02-01 MED ORDER — ORAL CARE MOUTH RINSE: 15.0000 mL | Freq: Once | OROMUCOSAL | Status: AC

## 2020-02-01 MED ORDER — FENTANYL CITRATE (PF) 100 MCG/2ML IJ SOLN: 25.0000 ug | INTRAMUSCULAR | Status: DC | PRN

## 2020-02-01 MED ORDER — PROPOFOL 500 MG/50ML IV EMUL
INTRAVENOUS | Status: AC
  Filled 2020-02-01: qty 50

## 2020-02-01 MED ORDER — FENTANYL CITRATE (PF) 100 MCG/2ML IJ SOLN: 50.0000 ug | Freq: Once | INTRAMUSCULAR | Status: AC

## 2020-02-01 MED ORDER — EPHEDRINE SULFATE 50 MG/ML IJ SOLN
INTRAMUSCULAR | Status: DC | PRN
  Administered 2020-02-01: 10 mg via INTRAVENOUS

## 2020-02-01 MED ORDER — CEFAZOLIN SODIUM 1 G IJ SOLR
INTRAMUSCULAR | Status: AC
  Filled 2020-02-01: qty 10

## 2020-02-01 MED ORDER — FENTANYL CITRATE (PF) 100 MCG/2ML IJ SOLN
INTRAMUSCULAR | Status: AC
  Filled 2020-02-01: qty 2

## 2020-02-01 MED ORDER — SUGAMMADEX SODIUM 200 MG/2ML IV SOLN
INTRAVENOUS | Status: DC | PRN
  Administered 2020-02-01: 200 mg via INTRAVENOUS

## 2020-02-01 MED ORDER — BUPIVACAINE HCL (PF) 0.5 % IJ SOLN
INTRAMUSCULAR | Status: DC | PRN
Start: 2020-02-01 — End: 2020-02-01
  Administered 2020-02-01: 10 mL

## 2020-02-01 MED ORDER — LACTATED RINGERS IV SOLN
INTRAVENOUS | Status: DC | PRN
  Administered 2020-02-01: 4 mL

## 2020-02-01 MED ORDER — FENTANYL CITRATE (PF) 100 MCG/2ML IJ SOLN
INTRAMUSCULAR | Status: DC | PRN
  Administered 2020-02-01: 50 ug via INTRAVENOUS

## 2020-02-01 MED ORDER — BUPIVACAINE HCL (PF) 0.5 % IJ SOLN
INTRAMUSCULAR | Status: AC
  Filled 2020-02-01: qty 10

## 2020-02-01 MED ORDER — ACETAMINOPHEN 10 MG/ML IV SOLN: 1000.0000 mg | Freq: Once | INTRAVENOUS | Status: DC | PRN

## 2020-02-01 MED ORDER — LACTATED RINGERS IV SOLN: INTRAVENOUS | Status: DC

## 2020-02-01 MED ORDER — ACETAMINOPHEN 10 MG/ML IV SOLN
INTRAVENOUS | Status: DC | PRN
  Administered 2020-02-01: 1000 mg via INTRAVENOUS

## 2020-02-01 MED ORDER — MIDAZOLAM HCL 2 MG/2ML IJ SOLN: 1.0000 mg | Freq: Once | INTRAMUSCULAR | Status: AC

## 2020-02-01 SURGICAL SUPPLY — 84 items
ADAPTER IRRIG TUBE 2 SPIKE SOL (ADAPTER) ×4 IMPLANT
ADH SKN CLS APL DERMABOND .7 (GAUZE/BANDAGES/DRESSINGS)
ADPR TBG 2 SPK PMP STRL ASCP (ADAPTER) ×2
ANCH SUT 4.75 1 ARM KNTLS (Anchor) ×2 IMPLANT
ANCH SUT SHRT 12.5 CANN EYLT (Anchor) ×1 IMPLANT
ANCH SUT SWLK 19.1X5.5 CLS (Anchor) ×2 IMPLANT
ANCHOR SUT BIOCOMP LK 2.9X12.5 (Anchor) ×1 IMPLANT
ANCHOR SWIVELOCK BIO COMP (Anchor) ×2 IMPLANT
APL PRP STRL LF DISP 70% ISPRP (MISCELLANEOUS) ×1
BNDG ADH 2 X3.75 FABRIC TAN LF (GAUZE/BANDAGES/DRESSINGS) ×2 IMPLANT
BNDG ADH XL 3.75X2 STRCH LF (GAUZE/BANDAGES/DRESSINGS) ×1
BUR BR 5.5 12 FLUTE (BURR) ×2 IMPLANT
BUR RADIUS 4.0X18.5 (BURR) ×2 IMPLANT
CANNULA PART THRD DISP 5.75X7 (CANNULA) IMPLANT
CANNULA PARTIAL THREAD 2X7 (CANNULA) IMPLANT
CANNULA TWIST IN 8.25X7CM (CANNULA) ×1 IMPLANT
CANNULA TWIST IN 8.25X9CM (CANNULA) IMPLANT
CHLORAPREP W/TINT 26 (MISCELLANEOUS) ×2 IMPLANT
COOLER POLAR GLACIER W/PUMP (MISCELLANEOUS) ×2 IMPLANT
COVER WAND RF STERILE (DRAPES) ×2 IMPLANT
CRADLE LAMINECT ARM (MISCELLANEOUS) ×2 IMPLANT
DERMABOND ADVANCED (GAUZE/BANDAGES/DRESSINGS)
DERMABOND ADVANCED .7 DNX12 (GAUZE/BANDAGES/DRESSINGS) IMPLANT
DRAPE 3/4 80X56 (DRAPES) ×2 IMPLANT
DRAPE IMP U-DRAPE 54X76 (DRAPES) ×4 IMPLANT
DRAPE INCISE IOBAN 66X45 STRL (DRAPES) ×2 IMPLANT
DRAPE U-SHAPE 47X51 STRL (DRAPES) ×4 IMPLANT
DRSG TEGADERM 4X4.75 (GAUZE/BANDAGES/DRESSINGS) ×7 IMPLANT
ELECT REM PT RETURN 9FT ADLT (ELECTROSURGICAL) ×2
ELECTRODE REM PT RTRN 9FT ADLT (ELECTROSURGICAL) ×1 IMPLANT
FIBER TAPE 2MM (SUTURE) ×2 IMPLANT
GAUZE SPONGE 4X4 12PLY STRL (GAUZE/BANDAGES/DRESSINGS) ×2 IMPLANT
GAUZE XEROFORM 1X8 LF (GAUZE/BANDAGES/DRESSINGS) ×2 IMPLANT
GLOVE BIO SURGEON STRL SZ7.5 (GLOVE) ×2 IMPLANT
GLOVE BIOGEL PI IND STRL 8 (GLOVE) ×2 IMPLANT
GLOVE BIOGEL PI INDICATOR 8 (GLOVE) ×2
GLOVE SURG ORTHO 8.0 STRL STRW (GLOVE) ×2 IMPLANT
GLOVE SURG SYN 8.0 (GLOVE) ×2 IMPLANT
GLOVE SURG SYN 8.0 PF PI (GLOVE) ×1 IMPLANT
GOWN STRL REUS W/ TWL LRG LVL3 (GOWN DISPOSABLE) ×2 IMPLANT
GOWN STRL REUS W/TWL LRG LVL3 (GOWN DISPOSABLE) ×4
GOWN STRL REUS W/TWL XL LVL4 (GOWN DISPOSABLE) ×2 IMPLANT
IV LACTATED RINGER IRRG 3000ML (IV SOLUTION) ×74
IV LR IRRIG 3000ML ARTHROMATIC (IV SOLUTION) ×4 IMPLANT
KIT INSERTION 2.9 PUSHLOCK (KITS) ×1 IMPLANT
KIT STABILIZATION SHOULDER (MISCELLANEOUS) ×2 IMPLANT
KIT SUTURETAK 3.0 INSERT PERC (KITS) IMPLANT
KIT TURNOVER KIT A (KITS) ×2 IMPLANT
MANIFOLD NEPTUNE II (INSTRUMENTS) ×2 IMPLANT
MASK FACE SPIDER DISP (MASK) ×2 IMPLANT
MAT ABSORB  FLUID 56X50 GRAY (MISCELLANEOUS) ×4
MAT ABSORB FLUID 56X50 GRAY (MISCELLANEOUS) ×2 IMPLANT
NDL MAYO CATGUT SZ5 (NEEDLE)
NDL SAFETY ECLIPSE 18X1.5 (NEEDLE) ×1 IMPLANT
NDL SCORPION MULTI FIRE (NEEDLE) IMPLANT
NDL SUT 5 .5 CRC TPR PNT MAYO (NEEDLE) IMPLANT
NEEDLE HYPO 18GX1.5 SHARP (NEEDLE) ×2
NEEDLE SCORPION MULTI FIRE (NEEDLE) ×2 IMPLANT
PACK SHDR ARTHRO (MISCELLANEOUS) ×2 IMPLANT
PAD WRAPON POLAR SHDR XLG (MISCELLANEOUS) ×1 IMPLANT
PENCIL SMOKE ULTRAEVAC 22 CON (MISCELLANEOUS) ×2 IMPLANT
SET TUBE SUCT SHAVER OUTFL 24K (TUBING) ×2 IMPLANT
SET TUBE TIP INTRA-ARTICULAR (MISCELLANEOUS) ×2 IMPLANT
SLING ULTRA II M (MISCELLANEOUS) ×2 IMPLANT
STAPLER SKIN PROX 35W (STAPLE) IMPLANT
STRAP SAFETY 5IN WIDE (MISCELLANEOUS) ×2 IMPLANT
SUT ETHILON 3-0 (SUTURE) ×3 IMPLANT
SUT LASSO 90 DEG CVD (SUTURE) IMPLANT
SUT LASSO 90 DEG SD STR (SUTURE) IMPLANT
SUT MNCRL 4-0 (SUTURE)
SUT MNCRL 4-0 27XMFL (SUTURE)
SUT PROLENE 0 CT 2 (SUTURE) IMPLANT
SUT VIC AB 0 CT1 36 (SUTURE) IMPLANT
SUT VIC AB 2-0 CT2 27 (SUTURE) IMPLANT
SUTURE MNCRL 4-0 27XMF (SUTURE) IMPLANT
SUTURE TAPE FIBERLINK 1.3 LOOP (SUTURE) IMPLANT
SUTURETAPE FIBERLINK 1.3 LOOP (SUTURE) ×4
SYSTEM ANCHOR SUT KNTLESS 4.75 (Anchor) ×2 IMPLANT
TAPE CLOTH 3X10 WHT NS LF (GAUZE/BANDAGES/DRESSINGS) ×2 IMPLANT
TAPE MICROFOAM 4IN (TAPE) ×2 IMPLANT
TUBING ARTHRO INFLOW-ONLY STRL (TUBING) ×2 IMPLANT
TUBING CONNECTING 10 (TUBING) IMPLANT
WAND WEREWOLF FLOW 90D (MISCELLANEOUS) ×2 IMPLANT
WRAPON POLAR PAD SHDR XLG (MISCELLANEOUS) ×2

## 2020-02-01 NOTE — Anesthesia Postprocedure Evaluation (Signed)
Anesthesia Post Note  Patient: Linda Schmitt  Procedure(s) Performed: RIGHT SHOULDER ARTHROSCOPIC SUBSCAPULARIS REPAIR, ROTATOR CUFF REPAIR, SUABCROMIAL DECOMPRESSION, AND BICEPS TENODESIS. (Right Shoulder)  Patient location during evaluation: PACU Anesthesia Type: General Level of consciousness: awake and alert Pain management: pain level controlled Vital Signs Assessment: post-procedure vital signs reviewed and stable Respiratory status: spontaneous breathing, nonlabored ventilation, respiratory function stable and patient connected to nasal cannula oxygen Cardiovascular status: blood pressure returned to baseline and stable Postop Assessment: no apparent nausea or vomiting Anesthetic complications: no   No complications documented.   Last Vitals:  Vitals:   02/01/20 1152 02/01/20 1203  BP:  116/65  Pulse: 64 61  Resp: 20 18  Temp: (!) 36.1 C (!) 36.1 C  SpO2: 95% 100%    Last Pain:  Vitals:   02/01/20 1203  TempSrc: Temporal  PainSc: 0-No pain                 Arita Miss

## 2020-02-01 NOTE — Op Note (Addendum)
SURGERY DATE: 02/01/2020    PRE-OP DIAGNOSIS:  1. Right subacromial impingement 2. Right biceps tendinopathy 3. Right rotator cuff tear 4. Right glenohumeral joint dislocation with large Hill-Sachs lesion   POST-OP DIAGNOSIS: 1. Right subacromial impingement 2. Right biceps tendinopathy 3. Right rotator cuff tear   PROCEDURES:  1. Right arthroscopic rotator cuff repair with remplissage 2. Right arthroscopic biceps tenodesis 3. Right arthroscopic subacromial decompression 4. Right arthroscopic extensive debridement of shoulder (glenohumeral and subacromial spaces)   SURGEON: Cato Mulligan, MD   ASSISTANT:  Anitra Lauth, PA   ANESTHESIA: Gen with Exparil interscalene block   ESTIMATED BLOOD LOSS: 5cc   DRAINS:  none   TOTAL IV FLUIDS: per anesthesia      SPECIMENS: none   IMPLANTS:  - Arthrex 2.34mm PushLock x 1 - Arthrex 5.60mm SwiveLock x 2 - Stryker Omega 4.17mm anchor x 2     OPERATIVE FINDINGS:  Examination under anesthesia: A careful examination under anesthesia was performed.  Passive range of motion was: FF: 150; ER at side: 45; ER in abduction: 90; IR in abduction: 45.  Anterior load shift: NT.  Posterior load shift: NT.  Sulcus in neutral: NT.  Sulcus in ER: NT.     Intra-operative findings: A thorough arthroscopic examination of the shoulder was performed.  The findings are: 1. Biceps tendon: tendinopathy with significant erythema and fraying proximally 2. Superior labrum: erythema and mildly degenerated 3. Posterior labrum and capsule: normal 4. Inferior capsule and inferior recess: normal 5. Glenoid cartilage surface: Normal 6. Supraspinatus attachment: full-thickness tear of the supraspinatus 7. Posterior rotator cuff attachment: normal 8. Humeral head articular cartilage: normal except very large Hill-Sachs defect 9. Rotator interval: significant synovitis 10: Subscapularis tendon: Mild interstitial tearing but entirety of the attachment was intact 11.  Anterior labrum: Not visualized 12. IGHL: normal   OPERATIVE REPORT:    Indications for procedure: Linda Schmitt is a 64 y.o. female with approximately 3 months of right shoulder pain after she fell down the stairs and sustained a glenohumeral joint dislocation.  [place] and she was referred to physical therapy here.  On reevaluation, the patient had persistent weakness. Clinical exam and MRI were suggestive of rotator cuff tear, biceps tendinopathy, and subacromial impingement.  There was also a very large Hill-Sachs lesion noted.  There was no bony Bankart lesion.  Given her age and lack of bony Bankart lesion, no further specific shoulder stabilization procedure was performed beyond remplissage. After discussion of risks, benefits, and alternatives to surgery, the patient elected to proceed.    Procedure in detail:   I identified Linda Schmitt  in the pre-operative holding area.  I marked the operative shoulder with my initials. I reviewed the risks and benefits of the proposed surgical intervention, and the patient (and/or patient's guardian) wished to proceed.  Anesthesia was then performed with an Exparil interscalene block.  The patient was transferred to the operative suite and placed in the beach chair position.     Appropriate IV antibiotics were administered prior to incision. The operative upper extremity was then prepped and draped in standard fashion. A time out was performed confirming the correct extremity, correct patient, and correct procedure.    I then created a standard posterior portal with an 11 blade. The glenohumeral joint was easily entered with a blunt trocar and the arthroscope introduced. The findings of diagnostic arthroscopy are described above. I debrided degenerative tissue including the synovitic  tissue about the rotator interval and superior labrum. I then coagulated the inflamed synovium to obtain  hemostasis and reduce the risk of post-operative swelling using an Arthrocare radiofrequency device.   I then turned my attention to the arthroscopic biceps tenodesis.  I used the loop n tack technique to pass a fiber tape through the biceps in a locked fashion adjacent to the biceps anchor.  A hole for a 2.9 mm Arthrex PushLock was drilled in the bicipital groove just superior to the subscapularis tendon insertion.  The biceps tendon was then cut.  The fiber tape was loaded onto the push lock anchor and impacted into place into the previously drilled hole in the bicipital groove.  This appropriately secured the biceps into the bicipital groove and took it off of tension.   Next, the arthroscope was then introduced into the subacromial space. A direct lateral portal was created with an 11-blade after spinal needle localization. An extensive subacromial bursectomy was performed using a combination of the shaver and Arthrocare wand. The entire acromial undersurface was exposed and the CA ligament was subperiosteally elevated to expose the anterior acromial hook. A burr was used to create a flat anterior and lateral aspect of the acromion, converting it from a Type 2 to a Type 1 acromion. Care was made to keep the deltoid fascia intact.   Next I created an accessory posterolateral portal to assist with visualization and instrumentation.  I debrided the poor quality edges of the supraspinatus tendon.  This was a U-shaped tear of the supraspinatus.  The large Hill-Sachs defect was again noted.  It was cleared of any soft tissue.  Additionally, I prepared the footprint lateral to the Hill-Sachs defect using a burr to expose bleeding bone.     I then percutaneously placed 5.5 mm swivel lock medial row anchor along the posterior portion of the posterior aspect of the Hill-Sachs lesion, just lateral to the articular margin.  This anchor was loaded with fiber tape and suture tape.  Another swivel lock anchor was  placed anteriorly, just anterior to the Hill-Sachs defect at the articular margin. I then shuttled all 8 strands of tape through the rotator cuff just lateral to the musculotendinous junction using a scorpion suture passer spanning the anterior to posterior aspect of the tear.    Additionally, the FiberWire suture from the posterior anchor was passed through the rotator cuff in a mattress fashion.  This was tied arthroscopically, bringing the rotator cuff into the defect. This was the remplissage portion of the procedure.   The posterior strands of each suture were passed through a Stryker Omega anchor.  This was placed approximately 2 cm distal to the lateral edge of the footprint in line with the posterior aspect of the tear with appropriate tensioning of each suture prior to final fixation.  Similarly, the anterior strands of each suture were passed through another Omega anchor along the anterior margin of the tear. This construct allowed for excellent reapproximation of the rotator cuff to its native footprint without undue tension.  Appropriate compression was achieved.  The repair was stable to external and internal rotation.   Fluid was evacuated from the shoulder, and the portals were closed with 3-0 Nylon. Xeroform was applied to the portals. A sterile dressing was applied, followed by a Polar Care sleeve and a SlingShot shoulder immobilizer/sling. The patient was awakened from anesthesia without difficulty and was transferred to the PACU in stable condition.    Of note, assistance from a  PA was essential to performing the surgery.  PA was present for the entire surgery.  PA assisted with patient positioning, retraction, instrumentation, and wound closure. The surgery would have been more difficult and had longer operative time without PA assistance.   Additionally, this case had increased complexity compared to standard arthroscopic rotator cuff repair given the large Hill-Sachs defect and  remplissage.  Required additional debridement and more careful placement of anchors to achieve appropriate fixation.  Additionally passed an extra stitch and tied an arthroscopic knot to further secure the repair into the defect to reduce the chance of shoulder dislocation postoperatively.  These factors increased surgical time by approximately 20 minutes.   COMPLICATIONS: none   DISPOSITION: plan for discharge home after recovery in PACU     POSTOPERATIVE PLAN: Remain in sling (except hygiene and elbow/wrist/hand RoM exercises as instructed by PT) x 6 weeks and NWB for this time. PT to begin 3-4 days after surgery.  Rotator cuff repair rehab protocol. ASA 325mg  daily x 2 weeks for DVT ppx.

## 2020-02-01 NOTE — Transfer of Care (Signed)
Immediate Anesthesia Transfer of Care Note  Patient: Antigone Crowell Guay  Procedure(s) Performed: RIGHT SHOULDER ARTHROSCOPIC SUBSCAPULARIS REPAIR, ROTATOR CUFF REPAIR, SUABCROMIAL DECOMPRESSION, AND BICEPS TENODESIS. (Right Shoulder)  Patient Location: PACU  Anesthesia Type:General  Level of Consciousness: sedated  Airway & Oxygen Therapy: Patient Spontanous Breathing and Patient connected to face mask oxygen  Post-op Assessment: Report given to RN and Post -op Vital signs reviewed and stable  Post vital signs: Reviewed and stable  Last Vitals:  Vitals Value Taken Time  BP 111/76 02/01/20 1113  Temp 36.2 C 02/01/20 1113  Pulse 70 02/01/20 1127  Resp 15 02/01/20 1127  SpO2 94 % 02/01/20 1127  Vitals shown include unvalidated device data.  Last Pain:  Vitals:   02/01/20 1113  TempSrc:   PainSc: 0-No pain         Complications: No complications documented.

## 2020-02-01 NOTE — Discharge Instructions (Signed)
Post-Op Instructions - Rotator Cuff Repair  1. Bracing: You will wear a shoulder immobilizer or sling for 6 weeks.   2. Driving: No driving for 3 weeks post-op. When driving, do not wear the immobilizer. Ideally, we recommend no driving for 6 weeks while sling is in place as one arm will be immobilized.   3. Activity: No active lifting for 2 months. Wrist, hand, and elbow motion only. Avoid lifting the upper arm away from the body except for hygiene. You are permitted to bend and straighten the elbow passively only (no active elbow motion). You may use your hand and wrist for typing, writing, and managing utensils (cutting food). Do not lift more than a coffee cup for 8 weeks.  When sleeping or resting, inclined positions (recliner chair or wedge pillow) and a pillow under the forearm for support may provide better comfort for up to 4 weeks.  Avoid long distance travel for 4 weeks.  Return to normal activities after rotator cuff repair repair normally takes 6 months on average. If rehab goes very well, may be able to do most activities at 4 months, except overhead or contact sports.  4. Physical Therapy: Begins 3-4 days after surgery, and proceed 1 time per week for the first 6 weeks, then 1-2 times per week from weeks 6-20 post-op.  5. Medications:  - You will be provided a prescription for narcotic pain medicine. After surgery, take 1-2 narcotic tablets every 4 hours if needed for severe pain.  - A prescription for anti-nausea medication will be provided in case the narcotic medicine causes nausea - take 1 tablet every 6 hours only if nauseated.   - Take tylenol 1000 mg (2 Extra Strength tablets or 3 regular strength) every 8 hours for pain.  May decrease or stop tylenol 5 days after surgery if you are having minimal pain. - Take ASA 325mg/day x 2 weeks to help prevent DVTs/PEs (blood clots).  - DO NOT take ANY nonsteroidal anti-inflammatory pain medications (Advil, Motrin, Ibuprofen, Aleve,  Naproxen, or Naprosyn). These medicines can inhibit healing of your shoulder repair.    If you are taking prescription medication for anxiety, depression, insomnia, muscle spasm, chronic pain, or for attention deficit disorder, you are advised that you are at a higher risk of adverse effects with use of narcotics post-op, including narcotic addiction/dependence, depressed breathing, death. If you use non-prescribed substances: alcohol, marijuana, cocaine, heroin, methamphetamines, etc., you are at a higher risk of adverse effects with use of narcotics post-op, including narcotic addiction/dependence, depressed breathing, death. You are advised that taking > 50 morphine milligram equivalents (MME) of narcotic pain medication per day results in twice the risk of overdose or death. For your prescription provided: oxycodone 5 mg - taking more than 6 tablets per day would result in > 50 morphine milligram equivalents (MME) of narcotic pain medication. Be advised that we will prescribe narcotics short-term, for acute post-operative pain only - 3 weeks for major operations such as shoulder repair/reconstruction surgeries.     6. Post-Op Appointment:  Your first post-op appointment will be 10-14 days post-op.  7. Work or School: For most, but not all procedures, we advise staying out of work or school for at least 1 to 2 weeks in order to recover from the stress of surgery and to allow time for healing.   If you need a work or school note this can be provided.   8. Smoking: If you are a smoker, you need to refrain from   smoking in the postoperative period. The nicotine in cigarettes will inhibit healing of your shoulder repair and decrease the chance of successful repair. Similarly, nicotine containing products (gum, patches) should be avoided.   Post-operative Brace: Apply and remove the brace you received as you were instructed to at the time of fitting and as described in detail as the brace's  instructions for use indicate.  Wear the brace for the period of time prescribed by your physician.  The brace can be cleaned with soap and water and allowed to air dry only.  Should the brace result in increased pain, decreased feeling (numbness/tingling), increased swelling or an overall worsening of your medical condition, please contact your doctor immediately.  If an emergency situation occurs as a result of wearing the brace after normal business hours, please dial 911 and seek immediate medical attention.  Let your doctor know if you have any further questions about the brace issued to you. Refer to the shoulder sling instructions for use if you have any questions regarding the correct fit of your shoulder sling.  [facility]: [phone]  Video that illustrates how to properly use a shoulder sling: "Instructions for Proper Use of an Orthopaedic Sling" [email or web address]    AMBULATORY SURGERY  DISCHARGE INSTRUCTIONS   1) The drugs that you were given will stay in your system until tomorrow so for the next 24 hours you should not:  A) Drive an automobile B) Make any legal decisions C) Drink any alcoholic beverage   2) You may resume regular meals tomorrow.  Today it is better to start with liquids and gradually work up to solid foods.  You may eat anything you prefer, but it is better to start with liquids, then soup and crackers, and gradually work up to solid foods.   3) Please notify your doctor immediately if you have any unusual bleeding, trouble breathing, redness and pain at the surgery site, drainage, fever, or pain not relieved by medication.    4) Additional Instructions:  Please contact your physician with any problems or Same Day Surgery at [phone], Monday through Friday 6 am to 4 pm, or [place] at [facility] number at [phone].       Interscalene Nerve Block with Exparel  1.  For your surgery  you have received an Interscalene Nerve Block with Exparel. 2. Nerve Blocks affect many types of nerves, including nerves that control movement, pain and normal sensation.  You may experience feelings such as numbness, tingling, heaviness, weakness or the inability to move your arm or the feeling or sensation that your arm has "fallen asleep". 3. A nerve block with Exparel can last up to 5 days.  Usually the weakness wears off first.  The tingling and heaviness usually wear off next.  Finally you may start to notice pain.  Keep in mind that this may occur in any order.  Once a nerve block starts to wear off it is usually completely gone within 60 minutes. 4. ISNB may cause mild shortness of breath, a hoarse voice, blurry vision, unequal pupils, or drooping of the face on the same side as the nerve block.  These symptoms will usually resolve with the numbness.  Very rarely the procedure itself can cause mild seizures. 5. If needed, your surgeon will give you a prescription for pain medication.  It will take about 60 minutes for the oral pain medication to become fully effective.  So, it is recommended that you start taking  this medication before the nerve block first begins to wear off, or when you first begin to feel discomfort. 6. Take your pain medication only as prescribed.  Pain medication can cause sedation and decrease your breathing if you take more than you need for the level of pain that you have. 7. Nausea is a common side effect of many pain medications.  You may want to eat something before taking your pain medicine to prevent nausea. 8. After an Interscalene nerve block, you cannot feel pain, pressure or extremes in temperature in the effected arm.  Because your arm is numb it is at an increased risk for injury.  To decrease the possibility of injury, please practice the following:  a. While you are awake change the position of your arm frequently to prevent too much pressure on any one area  for prolonged periods of time. b.  If you have a cast or tight dressing, check the color or your fingers every couple of hours.  Call your surgeon with the appearance of any discoloration (white or blue). c. If you are given a sling to wear before you go home, please wear it  at all times until the block has completely worn off.  Do not get up at night without your sling. d. Please contact [facility] or your surgeon if you do not begin to regain sensation after 7 days from the surgery.  Anesthesia may be contacted by calling the Same Day Surgery Department, Mon. through Fri., 6 am to 4 pm at [phone].   e. If you experience any other problems or concerns, please contact your surgeon's office. f. If you experience severe or prolonged shortness of breath go to the nearest emergency department.

## 2020-02-01 NOTE — H&P (Signed)
Paper H&P to be scanned into permanent record. H&P reviewed. No significant changes noted.  

## 2020-02-01 NOTE — Anesthesia Procedure Notes (Signed)
Procedure Name: Intubation Date/Time: 02/01/2020 8:01 AM Performed by: Justus Memory, CRNA Pre-anesthesia Checklist: Patient identified, Patient being monitored, Timeout performed, Emergency Drugs available and Suction available Patient Re-evaluated:Patient Re-evaluated prior to induction Oxygen Delivery Method: Circle system utilized Preoxygenation: Pre-oxygenation with 100% oxygen Induction Type: IV induction Ventilation: Mask ventilation without difficulty Laryngoscope Size: 3 and McGraph Grade View: Grade I Tube type: Oral Tube size: 7.0 mm Number of attempts: 1 Airway Equipment and Method: Video-laryngoscopy and Rigid stylet Placement Confirmation: ETT inserted through vocal cords under direct vision,  positive ETCO2 and breath sounds checked- equal and bilateral Secured at: 21 cm Tube secured with: Tape Dental Injury: Teeth and Oropharynx as per pre-operative assessment

## 2020-02-01 NOTE — Anesthesia Preprocedure Evaluation (Signed)
Anesthesia Evaluation  Patient identified by MRN, date of birth, ID band Patient awake    Reviewed: Allergy & Precautions, NPO status , Patient's Chart, lab work & pertinent test results  History of Anesthesia Complications Negative for: history of anesthetic complications  Airway Mallampati: I  TM Distance: >3 FB Neck ROM: Full    Dental  (+) Partial Upper, Dental Advisory Given, Teeth Intact   Pulmonary neg pulmonary ROS, neg sleep apnea, neg COPD, Patient abstained from smoking.Not current smoker, former smoker,    Pulmonary exam normal breath sounds clear to auscultation       Cardiovascular Exercise Tolerance: Good METS(-) hypertension(-) CAD and (-) Past MI negative cardio ROS  (-) dysrhythmias  Rhythm:Regular Rate:Normal - Systolic murmurs    Neuro/Psych  Headaches, PSYCHIATRIC DISORDERS Depression    GI/Hepatic neg GERD  ,(+)     (-) substance abuse  ,   Endo/Other  neg diabetesMorbid obesity  Renal/GU negative Renal ROS     Musculoskeletal   Abdominal (+) + obese,   Peds  Hematology   Anesthesia Other Findings Past Medical History: No date: HLD (hyperlipidemia)  Reproductive/Obstetrics                             Anesthesia Physical Anesthesia Plan  ASA: III  Anesthesia Plan: General   Post-op Pain Management:    Induction: Intravenous  PONV Risk Score and Plan: 4 or greater and Ondansetron, Dexamethasone and Midazolam  Airway Management Planned: Oral ETT  Additional Equipment: None  Intra-op Plan:   Post-operative Plan: Extubation in OR  Informed Consent: I have reviewed the patients History and Physical, chart, labs and discussed the procedure including the risks, benefits and alternatives for the proposed anesthesia with the patient or authorized representative who has indicated his/her understanding and acceptance.     Dental advisory given  Plan  Discussed with: CRNA and Surgeon  Anesthesia Plan Comments: (Discussed risks of anesthesia with patient, including PONV, sore throat, lip/dental damage. Rare risks discussed as well, such as cardiorespiratory and neurological sequelae. Patient understands.  Discussed r/b/a of interscalene block, including elective nature. Risks discussed: - Rare: bleeding, infection, nerve damage - shortness of breath from hemidiaphragmatic paralysis - unilateral horner's syndrome - poor/non-working blocks Patient understands and agrees. )        Anesthesia Quick Evaluation

## 2020-02-01 NOTE — Telephone Encounter (Signed)
Left message to call back  

## 2020-02-01 NOTE — Telephone Encounter (Signed)
Call pt Advise oral b12- I have sent to pharmacy  Ask her to call us for referral to endo for adrenal nodule when she is ready

## 2020-02-01 NOTE — Anesthesia Procedure Notes (Signed)
Anesthesia Regional Block: Interscalene brachial plexus block   Pre-Anesthetic Checklist: ,, timeout performed, Correct Patient, Correct Site, Correct Laterality, Correct Procedure, Correct Position, site marked, Risks and benefits discussed,  Surgical consent,  Pre-op evaluation,  At surgeon's request and post-op pain management  Laterality: Right  Prep: chloraprep       Needles:  Injection technique: Single-shot  Needle Type: Echogenic Needle     Needle Length: 4cm  Needle Gauge: 25     Additional Needles:   Narrative:  Start time: 02/01/2020 7:40 AM End time: 02/01/2020 7:48 AM Injection made incrementally with aspirations every 5 mL.  Performed by: Personally  Anesthesiologist: Arita Miss, MD  Additional Notes: Patient consented for risk and benefits of nerve block including but not limited to: nerve damage, failed block, bleeding and infection, hemidiaphragmatic paralysis and shortness of breath, Horner's syndrome.  Patient voiced understanding.  Functioning IV was confirmed and monitors were applied.  Sterile prep,hand hygiene and sterile gloves were used.  Minimal sedation used for procedure.   No paresthesia endorsed by patient during the procedure.  Negative aspiration and negative test dose prior to incremental administration of local anesthetic. The patient tolerated the procedure well with no immediate complications.

## 2020-02-05 NOTE — Telephone Encounter (Signed)
LMTCB for lab results.  

## 2020-02-13 NOTE — Telephone Encounter (Signed)
Para verbalized understanding. She wanted to inform Joycelyn Schmid that she is experiencing SOB and believes it to be from the recent anesthesia.

## 2020-02-13 NOTE — Telephone Encounter (Signed)
She needs to be evaluated if she is having shortness of breath. I would recommend urgent care or the ED so someone can lay eyes on her and figure out a specific cause. Please call her and advise her of this.

## 2020-02-13 NOTE — Telephone Encounter (Signed)
Called patient and asked about her SOB. Linda Schmitt states that she only has SOB when moving around or doing an activity. She states that she will go to an Urgent care today to be evaluated.

## 2020-02-17 ENCOUNTER — Emergency Department: Payer: Medicare Other

## 2020-02-17 ENCOUNTER — Encounter: Payer: Self-pay | Admitting: Emergency Medicine

## 2020-02-17 ENCOUNTER — Other Ambulatory Visit: Payer: Self-pay

## 2020-02-17 ENCOUNTER — Inpatient Hospital Stay
Admission: EM | Admit: 2020-02-17 | Discharge: 2020-02-20 | DRG: 844 | Disposition: A | Discharge status: Home Health | Payer: Medicare Other | Attending: Internal Medicine | Admitting: Internal Medicine

## 2020-02-17 DIAGNOSIS — Z803 Family history of malignant neoplasm of breast: Secondary | ICD-10-CM

## 2020-02-17 DIAGNOSIS — Z79899 Other long term (current) drug therapy: Secondary | ICD-10-CM

## 2020-02-17 DIAGNOSIS — Z6841 Body Mass Index (BMI) 40.0 and over, adult: Secondary | ICD-10-CM

## 2020-02-17 DIAGNOSIS — J910 Malignant pleural effusion: Secondary | ICD-10-CM

## 2020-02-17 DIAGNOSIS — E785 Hyperlipidemia, unspecified: Secondary | ICD-10-CM | POA: Diagnosis present

## 2020-02-17 DIAGNOSIS — Z87891 Personal history of nicotine dependence: Secondary | ICD-10-CM

## 2020-02-17 DIAGNOSIS — C801 Malignant (primary) neoplasm, unspecified: Principal | ICD-10-CM | POA: Diagnosis present

## 2020-02-17 DIAGNOSIS — Z807 Family history of other malignant neoplasms of lymphoid, hematopoietic and related tissues: Secondary | ICD-10-CM

## 2020-02-17 DIAGNOSIS — R0609 Other forms of dyspnea: Secondary | ICD-10-CM

## 2020-02-17 DIAGNOSIS — E669 Obesity, unspecified: Secondary | ICD-10-CM | POA: Diagnosis present

## 2020-02-17 DIAGNOSIS — J90 Pleural effusion, not elsewhere classified: Secondary | ICD-10-CM

## 2020-02-17 DIAGNOSIS — Z9889 Other specified postprocedural states: Secondary | ICD-10-CM

## 2020-02-17 DIAGNOSIS — Z823 Family history of stroke: Secondary | ICD-10-CM

## 2020-02-17 DIAGNOSIS — Z20822 Contact with and (suspected) exposure to covid-19: Secondary | ICD-10-CM | POA: Diagnosis present

## 2020-02-17 DIAGNOSIS — R0781 Pleurodynia: Secondary | ICD-10-CM | POA: Diagnosis not present

## 2020-02-17 DIAGNOSIS — R0600 Dyspnea, unspecified: Secondary | ICD-10-CM

## 2020-02-17 DIAGNOSIS — Z79891 Long term (current) use of opiate analgesic: Secondary | ICD-10-CM

## 2020-02-17 DIAGNOSIS — R079 Chest pain, unspecified: Secondary | ICD-10-CM

## 2020-02-17 DIAGNOSIS — R0602 Shortness of breath: Secondary | ICD-10-CM | POA: Diagnosis not present

## 2020-02-17 LAB — SARS CORONAVIRUS 2 BY RT PCR (HOSPITAL ORDER, PERFORMED IN ~~LOC~~ HOSPITAL LAB): SARS Coronavirus 2: NEGATIVE

## 2020-02-17 LAB — CBC
HCT: 39.5 % (ref 36.0–46.0)
Hemoglobin: 12.8 g/dL (ref 12.0–15.0)
MCH: 28.4 pg (ref 26.0–34.0)
MCHC: 32.4 g/dL (ref 30.0–36.0)
MCV: 87.6 fL (ref 80.0–100.0)
Platelets: 297 10*3/uL (ref 150–400)
RBC: 4.51 MIL/uL (ref 3.87–5.11)
RDW: 13.7 % (ref 11.5–15.5)
WBC: 11.5 10*3/uL — ABNORMAL HIGH (ref 4.0–10.5)
nRBC: 0 % (ref 0.0–0.2)

## 2020-02-17 LAB — BASIC METABOLIC PANEL
Anion gap: 9 (ref 5–15)
BUN: 11 mg/dL (ref 8–23)
CO2: 25 mmol/L (ref 22–32)
Calcium: 9.1 mg/dL (ref 8.9–10.3)
Chloride: 106 mmol/L (ref 98–111)
Creatinine, Ser: 0.89 mg/dL (ref 0.44–1.00)
GFR calc Af Amer: >60 mL/min (ref >60)
GFR calc non Af Amer: >60 mL/min (ref >60)
Glucose, Bld: 143 mg/dL — ABNORMAL HIGH (ref 70–99)
Potassium: 3.9 mmol/L (ref 3.5–5.1)
Sodium: 140 mmol/L (ref 135–145)

## 2020-02-17 LAB — TROPONIN I (HIGH SENSITIVITY)
Troponin I (High Sensitivity): 4 ng/L (ref <18)
Troponin I (High Sensitivity): 4 ng/L (ref <18)

## 2020-02-17 MED ORDER — OXYCODONE HCL 5 MG PO TABS: 5.0000 mg | ORAL_TABLET | ORAL | Status: DC | PRN

## 2020-02-17 MED ORDER — ONDANSETRON HCL 4 MG/2ML IJ SOLN: 4.0000 mg | Freq: Four times a day (QID) | INTRAMUSCULAR | Status: DC | PRN

## 2020-02-17 MED ORDER — ACETAMINOPHEN 325 MG PO TABS
650.0000 mg | ORAL_TABLET | Freq: Four times a day (QID) | ORAL | Status: DC | PRN
  Administered 2020-02-18 – 2020-02-19 (×4): 650 mg via ORAL
  Filled 2020-02-17 (×4): qty 2

## 2020-02-17 MED ORDER — BISACODYL 5 MG PO TBEC: 5.0000 mg | DELAYED_RELEASE_TABLET | Freq: Every day | ORAL | Status: DC | PRN

## 2020-02-17 MED ORDER — ONDANSETRON HCL 4 MG PO TABS: 4.0000 mg | ORAL_TABLET | Freq: Four times a day (QID) | ORAL | Status: DC | PRN

## 2020-02-17 MED ORDER — ACETAMINOPHEN 650 MG RE SUPP: 650.0000 mg | Freq: Four times a day (QID) | RECTAL | Status: DC | PRN

## 2020-02-17 MED ORDER — VITAMIN B-12 1000 MCG PO TABS
1000.0000 ug | ORAL_TABLET | Freq: Every day | ORAL | Status: DC
  Administered 2020-02-18 – 2020-02-20 (×3): 1000 ug via ORAL
  Filled 2020-02-17: qty 2
  Filled 2020-02-17: qty 1
  Filled 2020-02-17: qty 2
  Filled 2020-02-17: qty 1

## 2020-02-17 MED ORDER — GABAPENTIN 100 MG PO CAPS
100.0000 mg | ORAL_CAPSULE | Freq: Every day | ORAL | Status: DC
  Administered 2020-02-18 – 2020-02-19 (×3): 100 mg via ORAL
  Filled 2020-02-17 (×3): qty 1

## 2020-02-17 MED ORDER — SODIUM CHLORIDE 0.9 % IV BOLUS
1000.0000 mL | Freq: Once | INTRAVENOUS | Status: AC
  Administered 2020-02-17: 1000 mL via INTRAVENOUS

## 2020-02-17 MED ORDER — MORPHINE SULFATE (PF) 2 MG/ML IV SOLN: 1.0000 mg | INTRAVENOUS | Status: DC | PRN

## 2020-02-17 MED ORDER — IPRATROPIUM-ALBUTEROL 0.5-2.5 (3) MG/3ML IN SOLN: 3.0000 mL | Freq: Four times a day (QID) | RESPIRATORY_TRACT | Status: DC | PRN

## 2020-02-17 MED ORDER — PRAVASTATIN SODIUM 20 MG PO TABS
40.0000 mg | ORAL_TABLET | Freq: Every day | ORAL | Status: DC
  Administered 2020-02-18 – 2020-02-19 (×2): 40 mg via ORAL
  Filled 2020-02-17 (×2): qty 2

## 2020-02-17 MED ORDER — SENNOSIDES-DOCUSATE SODIUM 8.6-50 MG PO TABS: 1.0000 | ORAL_TABLET | Freq: Every evening | ORAL | Status: DC | PRN

## 2020-02-17 MED ORDER — SODIUM CHLORIDE 0.9% FLUSH: 3.0000 mL | Freq: Once | INTRAVENOUS | Status: DC

## 2020-02-17 MED ORDER — CHOLECALCIFEROL 10 MCG (400 UNIT) PO TABS
800.0000 [IU] | ORAL_TABLET | Freq: Every day | ORAL | Status: DC
  Administered 2020-02-18 – 2020-02-20 (×3): 800 [IU] via ORAL
  Filled 2020-02-17 (×4): qty 2

## 2020-02-17 NOTE — ED Provider Notes (Signed)
[facility] Emergency Department Provider Note  ____________________________________________  Time seen: Approximately 6:24 PM  I have reviewed the triage vital signs and the nursing notes.   HISTORY  Chief Complaint Chest Pain and Shortness of Breath    HPI Linda Schmitt is a 64 y.o. female with no significant past medical history who recently had elective right shoulder surgery  about 2 weeks ago who now complains of shortness of breath for the past week associated left lower chest pain which feels like tightness, intermittent lasting a few seconds at a time, nonradiating, no aggravating or alleviating factors, not pleuritic.  Denies fevers or chills, has a slight nonproductive cough occasionally.     Past Medical History:  Diagnosis Date  . HLD (hyperlipidemia)      Patient Active Problem List   Diagnosis Date Noted  . Mass of arm, right 01/22/2020  . Elevated blood pressure reading 12/28/2019  . Hand tingling 12/28/2019  . Acute pain of right shoulder 12/01/2018  . Adrenal adenoma, left 11/11/2017  . Atherosclerosis of aorta ([place]) 09/23/2017  . Prediabetes 07/09/2017  . Tendonitis of wrist, right 07/06/2017  . Hyperlipidemia 10/11/2016  . Depression, recurrent ([place]) 09/14/2016  . Headache 09/14/2016  . Routine general medical examination at a health care facility 01/14/2015  . Screening for breast cancer 11/12/2013     Past Surgical History:  Procedure Laterality Date  . CARPAL TUNNEL RELEASE Left   . SHOULDER ARTHROSCOPY WITH SUBACROMIAL DECOMPRESSION AND OPEN ROTATOR C Right 02/01/2020   Procedure: RIGHT SHOULDER ARTHROSCOPIC SUBSCAPULARIS REPAIR, ROTATOR CUFF REPAIR, SUABCROMIAL DECOMPRESSION, AND BICEPS TENODESIS.;  Surgeon: Leim Fabry, MD;  Location: [facility] ORS;  Service: Orthopedics;  Laterality: Right;  . TUBAL LIGATION       Prior to Admission medications   Medication Sig Start Date End Date Taking? Authorizing Provider   acetaminophen (TYLENOL) 500 MG tablet Take 2 tablets (1,000 mg total) by mouth every 8 (eight) hours. 02/01/20 01/31/21  Leim Fabry, MD  cholecalciferol (QC VITAMIN D3) 10 MCG (400 UNIT) TABS tablet Take 800 Units by mouth daily.    [provider]  Cyanocobalamin 1000 MCG CAPS Take 1 capsule by mouth daily. 02/01/20 03/02/20  Burnard Hawthorne, FNP  gabapentin (NEURONTIN) 100 MG capsule Take 1 capsule (100 mg total) by mouth 3 (three) times daily. Patient taking differently: Take 100 mg by mouth at bedtime.  12/28/19   Burnard Hawthorne, FNP  ondansetron (ZOFRAN ODT) 4 MG disintegrating tablet Take 1 tablet (4 mg total) by mouth every 8 (eight) hours as needed for nausea or vomiting. 02/01/20   Leim Fabry, MD  oxyCODONE (ROXICODONE) 5 MG immediate release tablet Take 1-2 tablets (5-10 mg total) by mouth every 4 (four) hours as needed (pain). 02/01/20 01/31/21  Leim Fabry, MD  pravastatin (PRAVACHOL) 40 MG tablet TAKE 1 TABLET(40 MG) BY MOUTH DAILY Patient taking differently: Take 40 mg by mouth at bedtime.  12/31/19   Burnard Hawthorne, FNP     Allergies Patient has no known allergies.   Family History  Problem Relation Age of Onset  . Cancer Mother        lymphoma  . Stroke Sister   . Cancer Brother        lung  . Cancer Sister        lung?  . Breast cancer Cousin 35       maternal    Social History Social History   Tobacco Use  . Smoking status: Former  Smoker    Packs/day: 0.75    Years: 40.00    Pack years: 30.00    Types: Cigarettes    Quit date: 02/06/2018    Years since quitting: 2.0  . Smokeless tobacco: Never Used  . Tobacco comment: quit 02/2018  Vaping Use  . Vaping Use: Never used  Substance Use Topics  . Alcohol use: Yes    Comment: 2-3 glasses of wine per week  . Drug use: No    Review of Systems  Constitutional:   No fever or chills.  ENT:   No sore throat. No rhinorrhea. Cardiovascular: Positive chest pain as above without palpitations or  syncope. Respiratory: Positive shortness of breath and cough. Gastrointestinal:   Negative for abdominal pain, vomiting and diarrhea.  Musculoskeletal:   Negative for focal pain or swelling All other systems reviewed and are negative except as documented above in ROS and HPI.  ____________________________________________   PHYSICAL EXAM:  VITAL SIGNS: ED Triage Vitals  Enc Vitals Group     BP 02/17/20 1720 (!) 167/106     Pulse Rate 02/17/20 1720 (!) 126     Resp 02/17/20 1716 20     Temp 02/17/20 1720 (!) 97.4 F (36.3 C)     Temp Source 02/17/20 1716 Oral     SpO2 02/17/20 1720 94 %     Weight 02/17/20 1717 229 lb 4.5 oz (104 kg)     Height 02/17/20 1717 5\' 3"  (1.6 m)     Head Circumference --      Peak Flow --      Pain Score 02/17/20 1717 2     Pain Loc --      Pain Edu? --      Excl. in [place]? --     Vital signs reviewed, nursing assessments reviewed.   Constitutional:   Alert and oriented. Non-toxic appearance. Eyes:   Conjunctivae are normal. EOMI. PERRL. ENT      Head:   Normocephalic and atraumatic.      Nose:   Wearing a mask.      Mouth/Throat:   Wearing a mask.      Neck:   No meningismus. Full ROM.  Thyroid nonpalpable Hematological/Lymphatic/Immunilogical:   No cervical lymphadenopathy.   Cardiovascular:   Tachycardia heart rate 120. Symmetric bilateral radial and DP pulses.  No murmurs. Cap refill less than 2 seconds. Respiratory:   Normal respiratory effort without tachypnea/retractions.  Normal breath sounds on the right, very diminished breath sounds on the left Gastrointestinal:   Soft and nontender. Non distended. There is no CVA tenderness.  No rebound, rigidity, or guarding.  Musculoskeletal:   Normal range of motion in all extremities. No joint effusions.  No lower extremity tenderness.  No edema. Neurologic:   Normal speech and language.  Motor grossly intact. No acute focal neurologic deficits are appreciated.  Skin:    Skin is warm, dry and  intact. No rash noted.  No petechiae, purpura, or bullae.  ____________________________________________    LABS (pertinent positives/negatives) (all labs ordered are listed, but only abnormal results are displayed) Labs Reviewed  BASIC METABOLIC PANEL - Abnormal; Notable for the following components:      Result Value   Glucose, Bld 143 (*)    All other components within normal limits  CBC - Abnormal; Notable for the following components:   WBC 11.5 (*)    All other components within normal limits  SARS CORONAVIRUS 2 BY RT PCR (HOSPITAL ORDER, [place]  [facility])  TROPONIN I (HIGH SENSITIVITY)  TROPONIN I (HIGH SENSITIVITY)   ____________________________________________   EKG  Interpreted by me Sinus tachycardia rate 126.  Normal axis and intervals.  Normal QRS ST segments and T waves.  No evidence of right heart strain.  ____________________________________________    RADIOLOGY  CT Chest Wo Contrast  Result Date: 02/17/2020 CLINICAL DATA:  64 year old female with pleural effusion. EXAM: CT CHEST WITHOUT CONTRAST TECHNIQUE: Multidetector CT imaging of the chest was performed following the standard protocol without IV contrast. COMPARISON:  Chest radiograph dated 02/17/2020 and CT dated 10/11/2019. FINDINGS: Evaluation of this exam is limited in the absence of intravenous contrast. Cardiovascular: There is no cardiomegaly or pericardial effusion. Minimal atherosclerotic calcification of the aortic arch. The aorta and central pulmonary arteries are otherwise grossly unremarkable on this noncontrast CT. Mediastinum/Nodes: There is slight shift of the mediastinum to the right of the midline secondary to mass effect caused by pleural effusion. No definite hilar or mediastinal adenopathy. Evaluation however is very limited in the absence of contrast as well as due to consolidative changes of the left lung. The esophagus and the thyroid gland are grossly unremarkable. No  mediastinal fluid collection. Lungs/Pleura: There is a large left pleural effusion with complete consolidative changes of the left lower lobe and majority of the left upper lobe which may represent compressive atelectasis or infiltrate. Underlying mass is not excluded. Clinical correlation and follow-up recommended. Thoracentesis may provide diagnostic information and symptomatic relief. These findings are new since the study of 10/11/2019. The right lung is clear. There is no pneumothorax. The central airways are patent. Upper Abdomen: Indeterminate 15 mm left adrenal nodule. Musculoskeletal: No chest wall mass or suspicious bone lesions identified. IMPRESSION: Large left pleural effusion with consolidative changes of the majority of the left lung, new since the prior CT of 10/11/2019. Clinical correlation and follow-up to resolution recommended. Thoracentesis may provide additional diagnostic information and symptomatic relief. Electronically Signed   By: Anner Crete M.D.   On: 02/17/2020 19:14   DG Chest Port 1 View  Result Date: 02/17/2020 CLINICAL DATA:  Chest pain and shortness of breath EXAM: PORTABLE CHEST 1 VIEW COMPARISON:  05/23/2016, 10/11/2019, 01/09/2020 FINDINGS: Single frontal view of the chest demonstrates interval opacification of the lower 2/3 of the left hemithorax, consistent with effusion and consolidation. Right chest is clear. No pneumothorax. No acute bony abnormalities. Chronic Hill-Sachs deformity and degenerative changes of the right shoulder. IMPRESSION: 1. Interval development of significant left lung consolidation and/or effusion. Electronically Signed   By: Randa Ngo M.D.   On: 02/17/2020 18:03    ____________________________________________   PROCEDURES Procedures  ____________________________________________  DIFFERENTIAL DIAGNOSIS   Pneumonia, pleural effusion, unlikely pulmonary edema or pulmonary embolism.  Doubt sepsis  CLINICAL IMPRESSION /  ASSESSMENT AND PLAN / ED COURSE  Medications ordered in the ED: Medications  sodium chloride flush (NS) 0.9 % injection 3 mL (has no administration in time range)  sodium chloride 0.9 % bolus 1,000 mL (1,000 mLs Intravenous New Bag/Given 02/17/20 1843)    Pertinent labs & imaging results that were available during my care of the patient were reviewed by me and considered in my medical decision making (see chart for details).  Linda Schmitt was evaluated in Emergency Department on 02/17/2020 for the symptoms described in the history of present illness. She was evaluated in the context of the global COVID-19 pandemic, which necessitated consideration that the patient might be at risk for infection with the  SARS-CoV-2 virus that causes COVID-19. Institutional protocols and algorithms that pertain to the evaluation of patients at risk for COVID-19 are in a state of rapid change based on information released by regulatory bodies including the CDC and federal and state organizations. These policies and algorithms were followed during the patient's care in the ED.   Patient presents with chest pain shortness of breath, initial chest x-ray suspicious for a large left pleural effusion.  Prior CT scan of the chest in March 2021 is essentially unremarkable except for some structural changes suggestive of underlying mild COPD.    Given recent anesthesia and intubation, will obtain CT chest to further delineate the pathology.  Anticipate needing hospitalization for treatment and respiratory monitoring.  ----------------------------------------- 8:08 PM on 02/17/2020 -----------------------------------------  CT confirms large left pleural effusion compressing the left lung.  No mass.  Discussed with hospitalist.       ____________________________________________   FINAL CLINICAL IMPRESSION(S) / ED DIAGNOSES    Final diagnoses:  Pleural effusion, left  DOE (dyspnea on exertion)     ED  Discharge Orders    None      Portions of this note were generated with dragon dictation software. Dictation errors may occur despite best attempts at proofreading.   Tyese Mew, MD 02/17/20 2008

## 2020-02-17 NOTE — H&P (Addendum)
History and Physical   [facility] [place] @ [facility] Admission History and Physical [employer], D.O.    Patient Name: Linda Schmitt MR#: [id number] Date of Birth: September 03, 1955 Date of Admission: 02/17/2020  Referring MD/NP/PA: Dr. Joni Fears Primary Care Physician: Burnard Hawthorne, FNP  Chief Complaint:  Chief Complaint  Patient presents with  . Chest Pain  . Shortness of Breath    HPI: Linda Schmitt is a 64 y.o. female with a known history of HLD, two weeks s/p elective shoulder surgery presents to the emergency department for evaluation of SOB, chest pain.  Patient was in her usual state of health until about 1-2 weeks ago when she describes progressively worsening shortness of breath.  Pain and shortness of breath started out as mild.  Pain is localized, described as pressure, is intermittent and transient.  Of note patient had a fall with a shoulder dislocation and rotator cuff injury in March 2021.  She had an elective repair on February 01, 2020.  Shortness of breath began shortly after that  Patient denies fevers/chills, weakness, dizziness, palpitations, N/V/C/D, abdominal pain, dysuria/frequency, changes in mental status.    Otherwise there has been no change in status. Patient has been taking medication as prescribed and there has been no recent change in medication or diet.  No recent antibiotics.  There has been no recent illness, hospitalizations, travel or sick contacts.    EMS/ED Course: Patient received NS. Medical admission has been requested for further management of large left pleural effusion.  Review of Systems:  CONSTITUTIONAL: No fever/chills, fatigue, weakness, weight gain/loss, headache. EYES: No blurry or double vision. ENT: No tinnitus, postnasal drip, redness or soreness of the oropharynx. RESPIRATORY: Positive cough, dyspnea, negative wheeze.  No hemoptysis.  CARDIOVASCULAR: Positive chest pain, no palpitations, syncope, orthopnea. No  lower extremity edema.  GASTROINTESTINAL: No nausea, vomiting, abdominal pain, diarrhea, constipation.  No hematemesis, melena or hematochezia. GENITOURINARY: No dysuria, frequency, hematuria. ENDOCRINE: No polyuria or nocturia. No heat or cold intolerance. HEMATOLOGY: No anemia, bruising, bleeding. INTEGUMENTARY: No rashes, ulcers, lesions. MUSCULOSKELETAL: No arthritis, gout. NEUROLOGIC: No numbness, tingling, ataxia, seizure-type activity, weakness. PSYCHIATRIC: No anxiety, depression, insomnia.   Past Medical History:  Diagnosis Date  . HLD (hyperlipidemia)     Past Surgical History:  Procedure Laterality Date  . CARPAL TUNNEL RELEASE Left   . SHOULDER ARTHROSCOPY WITH SUBACROMIAL DECOMPRESSION AND OPEN ROTATOR C Right 02/01/2020   Procedure: RIGHT SHOULDER ARTHROSCOPIC SUBSCAPULARIS REPAIR, ROTATOR CUFF REPAIR, SUABCROMIAL DECOMPRESSION, AND BICEPS TENODESIS.;  Surgeon: Leim Fabry, MD;  Location: [facility] ORS;  Service: Orthopedics;  Laterality: Right;  . TUBAL LIGATION       reports that she quit smoking about 2 years ago. Her smoking use included cigarettes. She has a 30.00 pack-year smoking history. She has never used smokeless tobacco. She reports current alcohol use. She reports that she does not use drugs.  No Known Allergies  Family History  Problem Relation Age of Onset  . Cancer Mother        lymphoma  . Stroke Sister   . Cancer Brother        lung  . Cancer Sister        lung?  . Breast cancer Cousin 45       maternal    Prior to Admission medications   Medication Sig Start Date End Date Taking? Authorizing Provider  acetaminophen (TYLENOL) 500 MG tablet Take 2 tablets (1,000 mg total) by mouth every 8 (eight) hours. 02/01/20 01/31/21  Leim Fabry, MD  cholecalciferol (QC VITAMIN D3) 10 MCG (400 UNIT) TABS tablet Take 800 Units by mouth daily.    [provider]  Cyanocobalamin 1000 MCG CAPS Take 1 capsule by mouth daily. 02/01/20 03/02/20  Burnard Hawthorne, FNP  gabapentin (NEURONTIN) 100 MG capsule Take 1 capsule (100 mg total) by mouth 3 (three) times daily. Patient taking differently: Take 100 mg by mouth at bedtime.  12/28/19   Burnard Hawthorne, FNP  ondansetron (ZOFRAN ODT) 4 MG disintegrating tablet Take 1 tablet (4 mg total) by mouth every 8 (eight) hours as needed for nausea or vomiting. 02/01/20   Leim Fabry, MD  oxyCODONE (ROXICODONE) 5 MG immediate release tablet Take 1-2 tablets (5-10 mg total) by mouth every 4 (four) hours as needed (pain). 02/01/20 01/31/21  Leim Fabry, MD  pravastatin (PRAVACHOL) 40 MG tablet TAKE 1 TABLET(40 MG) BY MOUTH DAILY Patient taking differently: Take 40 mg by mouth at bedtime.  12/31/19   Burnard Hawthorne, FNP    Physical Exam: Vitals:   02/17/20 1716 02/17/20 1717 02/17/20 1720  BP:   (!) 167/106  Pulse:   (!) 126  Resp: 20  18  Temp:   (!) 97.4 F (36.3 C)  TempSrc: Oral  Oral  SpO2:   94%  Weight:  104 kg   Height:  5\' 3"  (1.6 m)     GENERAL: 64 y.o.-year-old Black female patient, well-developed, well-nourished lying in the bed in no acute distress.  Pleasant and cooperative.   HEENT: Head atraumatic, normocephalic. Pupils equal. Mucus membranes moist. NECK: Supple. No JVD. CHEST: Absent L breath sounds. No wheezing, rales, rhonchi or crackles. No use of accessory muscles of respiration.  No reproducible chest Linda Schmitt tenderness.  CARDIOVASCULAR: S1, S2 normal. No murmurs, rubs, or gallops. Cap refill <2 seconds. Pulses intact distally.  ABDOMEN: Soft, nondistended, nontender. No rebound, guarding, rigidity. Normoactive bowel sounds present in all four quadrants.  EXTREMITIES: Right shoulder is in a sling.  Pulses intact.  No pedal edema, cyanosis, or clubbing. No calf tenderness or Homan's sign.  NEUROLOGIC: The patient is alert and oriented x 3. Cranial nerves II through XII are grossly intact with no focal sensorimotor deficit. PSYCHIATRIC:  Normal affect, mood, thought  content. SKIN: Warm, dry, and intact without obvious rash, lesion, or ulcer.    Labs on Admission:  CBC: Recent Labs  Lab 02/17/20 1739  WBC 11.5*  HGB 12.8  HCT 39.5  MCV 87.6  PLT 875   Basic Metabolic Panel: Recent Labs  Lab 02/17/20 1739  NA 140  K 3.9  CL 106  CO2 25  GLUCOSE 143*  BUN 11  CREATININE 0.89  CALCIUM 9.1   GFR: Estimated Creatinine Clearance: 71.7 mL/min (by C-G formula based on SCr of 0.89 mg/dL). Liver Function Tests: No results for input(s): AST, ALT, ALKPHOS, BILITOT, PROT, ALBUMIN in the last 168 hours. No results for input(s): LIPASE, AMYLASE in the last 168 hours. No results for input(s): AMMONIA in the last 168 hours. Coagulation Profile: No results for input(s): INR, PROTIME in the last 168 hours. Cardiac Enzymes: No results for input(s): CKTOTAL, CKMB, CKMBINDEX, TROPONINI in the last 168 hours. BNP (last 3 results) No results for input(s): PROBNP in the last 8760 hours. HbA1C: No results for input(s): HGBA1C in the last 72 hours. CBG: No results for input(s): GLUCAP in the last 168 hours. Lipid Profile: No results for input(s): CHOL, HDL, LDLCALC, TRIG, CHOLHDL, LDLDIRECT in the last 72 hours.  Thyroid Function Tests: No results for input(s): TSH, T4TOTAL, FREET4, T3FREE, THYROIDAB in the last 72 hours. Anemia Panel: No results for input(s): VITAMINB12, FOLATE, FERRITIN, TIBC, IRON, RETICCTPCT in the last 72 hours. Urine analysis: No results found for: COLORURINE, APPEARANCEUR, LABSPEC, PHURINE, GLUCOSEU, HGBUR, BILIRUBINUR, KETONESUR, PROTEINUR, UROBILINOGEN, NITRITE, LEUKOCYTESUR Sepsis Labs: @LABRCNTIP (procalcitonin:4,lacticidven:4) ) Recent Results (from the past 240 hour(s))  SARS Coronavirus 2 by RT PCR (hospital order, performed in [facility] hospital lab) Nasopharyngeal Nasopharyngeal Swab     Status: None   Collection Time: 02/17/20  6:44 PM   Specimen: Nasopharyngeal Swab  Result Value Ref Range Status   SARS  Coronavirus 2 NEGATIVE NEGATIVE Final    Comment: (NOTE) SARS-CoV-2 target nucleic acids are NOT DETECTED.  The SARS-CoV-2 RNA is generally detectable in upper and lower respiratory specimens during the acute phase of infection. The lowest concentration of SARS-CoV-2 viral copies this assay can detect is 250 copies / mL. A negative result does not preclude SARS-CoV-2 infection and should not be used as the sole basis for treatment or other patient management decisions.  A negative result may occur with improper specimen collection / handling, submission of specimen other than nasopharyngeal swab, presence of viral mutation(s) within the areas targeted by this assay, and inadequate number of viral copies (<250 copies / mL). A negative result must be combined with clinical observations, patient history, and epidemiological information.  Fact Sheet for Patients:   StrictlyIdeas.no  Fact Sheet for Healthcare Providers: BankingDealers.co.za  This test is not yet approved or  cleared by the [place] FDA and has been authorized for detection and/or diagnosis of SARS-CoV-2 by FDA under an Emergency Use Authorization (EUA).  This EUA will remain in effect (meaning this test can be used) for the duration of the COVID-19 declaration under Section 564(b)(1) of the Act, 21 U.S.C. section 360bbb-3(b)(1), unless the authorization is terminated or revoked sooner.  Performed at [facility], [address] [place], [place] [address]      Radiological Exams on Admission: CT Chest Wo Contrast  Result Date: 02/17/2020 CLINICAL DATA:  64 year old female with pleural effusion. EXAM: CT CHEST WITHOUT CONTRAST TECHNIQUE: Multidetector CT imaging of the chest was performed following the standard protocol without IV contrast. COMPARISON:  Chest radiograph dated 02/17/2020 and CT dated 10/11/2019. FINDINGS: Evaluation of this exam is limited in  the absence of intravenous contrast. Cardiovascular: There is no cardiomegaly or pericardial effusion. Minimal atherosclerotic calcification of the aortic arch. The aorta and central pulmonary arteries are otherwise grossly unremarkable on this noncontrast CT. Mediastinum/Nodes: There is slight shift of the mediastinum to the right of the midline secondary to mass effect caused by pleural effusion. No definite hilar or mediastinal adenopathy. Evaluation however is very limited in the absence of contrast as well as due to consolidative changes of the left lung. The esophagus and the thyroid gland are grossly unremarkable. No mediastinal fluid collection. Lungs/Pleura: There is a large left pleural effusion with complete consolidative changes of the left lower lobe and majority of the left upper lobe which may represent compressive atelectasis or infiltrate. Underlying mass is not excluded. Clinical correlation and follow-up recommended. Thoracentesis may provide diagnostic information and symptomatic relief. These findings are new since the study of 10/11/2019. The right lung is clear. There is no pneumothorax. The central airways are patent. Upper Abdomen: Indeterminate 15 mm left adrenal nodule. Musculoskeletal: No chest Jenissa Tyrell mass or suspicious bone lesions identified. IMPRESSION: Large left pleural effusion with consolidative changes of the  majority of the left lung, new since the prior CT of 10/11/2019. Clinical correlation and follow-up to resolution recommended. Thoracentesis may provide additional diagnostic information and symptomatic relief. Electronically Signed   By: Anner Crete M.D.   On: 02/17/2020 19:14   DG Chest Port 1 View  Result Date: 02/17/2020 CLINICAL DATA:  Chest pain and shortness of breath EXAM: PORTABLE CHEST 1 VIEW COMPARISON:  05/23/2016, 10/11/2019, 01/09/2020 FINDINGS: Single frontal view of the chest demonstrates interval opacification of the lower 2/3 of the left hemithorax,  consistent with effusion and consolidation. Right chest is clear. No pneumothorax. No acute bony abnormalities. Chronic Hill-Sachs deformity and degenerative changes of the right shoulder. IMPRESSION: 1. Interval development of significant left lung consolidation and/or effusion. Electronically Signed   By: Randa Ngo M.D.   On: 02/17/2020 18:03    EKG: Sinus tach at 126 bpm with normal axis and nonspecific ST-T wave changes.   Assessment/Plan  This is a 64 y.o. female with a history of HLD, two weeks s/p elective shoulder surgery now being admitted with:  #. Large left pleural effusion with compression of left lung - Admit obs - NPO after midnight for thoracentesis in AM - O2, nebs PRN - Pain control - Intake/Output - Check sputum culture - Check BNP - No indication of infection at this time, so will hold off on antibiotics - Please contact IR in AM  #. Chest pain, rule out ACS - Trend troponins, check lipids and TSH. - Morphine, nitro, beta blocker, aspirin and statin ordered.   - Consider echo and cardio consult  #. History of HLD - Continue pravastatin   Admission status: Obs, tele IV Fluids: HL Diet/Nutrition: Heart healthy, NPO after midnight Consults called: IR to be called in AM  DVT Px: SCDs and early ambulation. Code Status: Full Code  Disposition Plan: To home in less than 24 hours  All the records are reviewed and case discussed with ED provider. Management plans discussed with the patient and/or family who express understanding and agree with plan of care.  Alexis Hugelmeyer D.O. on 02/17/2020 at 8:10 PM CC: Primary care physician; Burnard Hawthorne, FNP   02/17/2020, 8:10 PM

## 2020-02-17 NOTE — ED Notes (Signed)
Pt pressed call bell for help, this RN to bedside. Pt asks for oxygen to be placed because she feels more shob. Pt O2 91% on monitor on RA, this RN does not notice any worsening shob or labored breathing. Placed pt on 2L [place], O2 now 95%.

## 2020-02-17 NOTE — ED Notes (Signed)
Pt otf for CT

## 2020-02-17 NOTE — ED Notes (Signed)
Pt had surgery on June 25 on right shoulder.

## 2020-02-17 NOTE — ED Triage Notes (Signed)
Pt to ED via POV c/o chest pain and shortness of breath x 1 week. Pt states that the pain and ShOB has gotten worse. Pt states that the pain is in the left side of her chest and under her left breast. Pt denies any radiation of the pain. Pt states that the pain comes and goes, pain typically last a couple of seconds. Pt denies any other symptoms besides ShOB. Pt is in NAD.

## 2020-02-17 NOTE — ED Notes (Signed)
Floor call for pt coming, Linda Schmitt  Pt has all belongings

## 2020-02-17 NOTE — ED Notes (Signed)
Pt given turkey sandwich tray and water 

## 2020-02-18 ENCOUNTER — Inpatient Hospital Stay: Payer: Medicare Other

## 2020-02-18 ENCOUNTER — Inpatient Hospital Stay (HOSPITAL_COMMUNITY)
Admit: 2020-02-18 | Discharge: 2020-02-18 | Disposition: A | Discharge status: Home / Self Care | Payer: Medicare Other | Attending: Internal Medicine | Admitting: Internal Medicine

## 2020-02-18 ENCOUNTER — Observation Stay: Payer: Medicare Other

## 2020-02-18 ENCOUNTER — Telehealth: Payer: Self-pay | Admitting: Family

## 2020-02-18 DIAGNOSIS — R0789 Other chest pain: Secondary | ICD-10-CM | POA: Diagnosis not present

## 2020-02-18 DIAGNOSIS — Z6841 Body Mass Index (BMI) 40.0 and over, adult: Secondary | ICD-10-CM | POA: Diagnosis not present

## 2020-02-18 DIAGNOSIS — R079 Chest pain, unspecified: Secondary | ICD-10-CM | POA: Diagnosis not present

## 2020-02-18 DIAGNOSIS — Z803 Family history of malignant neoplasm of breast: Secondary | ICD-10-CM | POA: Diagnosis not present

## 2020-02-18 DIAGNOSIS — Z79891 Long term (current) use of opiate analgesic: Secondary | ICD-10-CM | POA: Diagnosis not present

## 2020-02-18 DIAGNOSIS — Z9889 Other specified postprocedural states: Secondary | ICD-10-CM | POA: Diagnosis not present

## 2020-02-18 DIAGNOSIS — Z20822 Contact with and (suspected) exposure to covid-19: Secondary | ICD-10-CM | POA: Diagnosis present

## 2020-02-18 DIAGNOSIS — J910 Malignant pleural effusion: Secondary | ICD-10-CM | POA: Diagnosis present

## 2020-02-18 DIAGNOSIS — Z823 Family history of stroke: Secondary | ICD-10-CM | POA: Diagnosis not present

## 2020-02-18 DIAGNOSIS — E785 Hyperlipidemia, unspecified: Secondary | ICD-10-CM | POA: Diagnosis present

## 2020-02-18 DIAGNOSIS — C801 Malignant (primary) neoplasm, unspecified: Secondary | ICD-10-CM | POA: Diagnosis present

## 2020-02-18 DIAGNOSIS — J90 Pleural effusion, not elsewhere classified: Secondary | ICD-10-CM | POA: Diagnosis not present

## 2020-02-18 DIAGNOSIS — Z79899 Other long term (current) drug therapy: Secondary | ICD-10-CM | POA: Diagnosis not present

## 2020-02-18 DIAGNOSIS — Z807 Family history of other malignant neoplasms of lymphoid, hematopoietic and related tissues: Secondary | ICD-10-CM | POA: Diagnosis not present

## 2020-02-18 DIAGNOSIS — R0602 Shortness of breath: Secondary | ICD-10-CM | POA: Diagnosis present

## 2020-02-18 DIAGNOSIS — E669 Obesity, unspecified: Secondary | ICD-10-CM | POA: Diagnosis present

## 2020-02-18 DIAGNOSIS — Z87891 Personal history of nicotine dependence: Secondary | ICD-10-CM | POA: Diagnosis not present

## 2020-02-18 LAB — BASIC METABOLIC PANEL
Anion gap: 4 — ABNORMAL LOW (ref 5–15)
BUN: 9 mg/dL (ref 8–23)
CO2: 29 mmol/L (ref 22–32)
Calcium: 8.7 mg/dL — ABNORMAL LOW (ref 8.9–10.3)
Chloride: 109 mmol/L (ref 98–111)
Creatinine, Ser: 0.83 mg/dL (ref 0.44–1.00)
GFR calc Af Amer: >60 mL/min (ref >60)
GFR calc non Af Amer: >60 mL/min (ref >60)
Glucose, Bld: 127 mg/dL — ABNORMAL HIGH (ref 70–99)
Potassium: 4 mmol/L (ref 3.5–5.1)
Sodium: 142 mmol/L (ref 135–145)

## 2020-02-18 LAB — AMYLASE, PLEURAL OR PERITONEAL FLUID: Amylase, Fluid: 100 U/L

## 2020-02-18 LAB — CBC
HCT: 35.4 % — ABNORMAL LOW (ref 36.0–46.0)
Hemoglobin: 11.9 g/dL — ABNORMAL LOW (ref 12.0–15.0)
MCH: 29.2 pg (ref 26.0–34.0)
MCHC: 33.6 g/dL (ref 30.0–36.0)
MCV: 86.8 fL (ref 80.0–100.0)
Platelets: 247 10*3/uL (ref 150–400)
RBC: 4.08 MIL/uL (ref 3.87–5.11)
RDW: 13.6 % (ref 11.5–15.5)
WBC: 9.1 10*3/uL (ref 4.0–10.5)
nRBC: 0 % (ref 0.0–0.2)

## 2020-02-18 LAB — HIV ANTIBODY (ROUTINE TESTING W REFLEX): HIV Screen 4th Generation wRfx: NONREACTIVE

## 2020-02-18 LAB — HEPATIC FUNCTION PANEL
ALT: 18 U/L (ref 0–44)
AST: 17 U/L (ref 15–41)
Albumin: 3.6 g/dL (ref 3.5–5.0)
Alkaline Phosphatase: 42 U/L (ref 38–126)
Bilirubin, Direct: <0.1 mg/dL (ref 0.0–0.2)
Total Bilirubin: 0.9 mg/dL (ref 0.3–1.2)
Total Protein: 6.9 g/dL (ref 6.5–8.1)

## 2020-02-18 LAB — GLUCOSE, PLEURAL OR PERITONEAL FLUID: Glucose, Fluid: 86 mg/dL

## 2020-02-18 LAB — TROPONIN I (HIGH SENSITIVITY)
Troponin I (High Sensitivity): 4 ng/L (ref <18)
Troponin I (High Sensitivity): 4 ng/L (ref <18)
Troponin I (High Sensitivity): 6 ng/L (ref <18)

## 2020-02-18 LAB — LACTATE DEHYDROGENASE, PLEURAL OR PERITONEAL FLUID: LD, Fluid: 537 U/L — ABNORMAL HIGH (ref 3–23)

## 2020-02-18 LAB — LIPID PANEL
Cholesterol: 149 mg/dL (ref 0–200)
HDL: 45 mg/dL (ref >40)
LDL Cholesterol: 84 mg/dL (ref 0–99)
Total CHOL/HDL Ratio: 3.3 RATIO
Triglycerides: 102 mg/dL (ref <150)
VLDL: 20 mg/dL (ref 0–40)

## 2020-02-18 LAB — BODY FLUID CELL COUNT WITH DIFFERENTIAL
Eos, Fluid: 2 %
Lymphs, Fluid: 66 %
Monocyte-Macrophage-Serous Fluid: 3 %
Neutrophil Count, Fluid: 29 %
Total Nucleated Cell Count, Fluid: 948 cu mm

## 2020-02-18 LAB — TSH: TSH: 0.964 u[IU]/mL (ref 0.350–4.500)

## 2020-02-18 LAB — ALBUMIN, PLEURAL OR PERITONEAL FLUID: Albumin, Fluid: 3 g/dL

## 2020-02-18 LAB — LACTATE DEHYDROGENASE: LDH: 187 U/L (ref 98–192)

## 2020-02-18 LAB — PROTEIN, PLEURAL OR PERITONEAL FLUID: Total protein, fluid: 4.9 g/dL

## 2020-02-18 LAB — PROTIME-INR
INR: 1.1 (ref 0.8–1.2)
Prothrombin Time: 13.9 seconds (ref 11.4–15.2)

## 2020-02-18 LAB — BRAIN NATRIURETIC PEPTIDE: B Natriuretic Peptide: 30.9 pg/mL (ref 0.0–100.0)

## 2020-02-18 LAB — PHOSPHORUS: Phosphorus: 3.5 mg/dL (ref 2.5–4.6)

## 2020-02-18 LAB — MAGNESIUM: Magnesium: 1.7 mg/dL (ref 1.7–2.4)

## 2020-02-18 NOTE — Plan of Care (Signed)
  Problem: Education: Goal: Knowledge of General Education information will improve Description Including pain rating scale, medication(s)/side effects and non-pharmacologic comfort measures Outcome: Progressing   

## 2020-02-18 NOTE — Telephone Encounter (Signed)
Pt called in to let Arnett know that she is in [place].

## 2020-02-18 NOTE — Progress Notes (Signed)
Patient remains unable to provide sputum sample. Needed material for sample collection to be passed on to oncoming nurse.

## 2020-02-18 NOTE — Progress Notes (Signed)
*  PRELIMINARY RESULTS* Echocardiogram 2D Echocardiogram has been performed.  Linda Schmitt Linda Schmitt 02/18/2020, 7:55 PM

## 2020-02-18 NOTE — Procedures (Signed)
Ultrasound-guided diagnostic and therapeutic left sided thoracentesis performed yielding 1.8liters of serosanguinous colored fluid. Patient unable to tolerate additoinal fluid removal at this time. No immediate complications.   Diagnostic fluid was sent to the lab for further analysis. Follow-up chest x-ray pending. EBL is  < 26ml.

## 2020-02-18 NOTE — Progress Notes (Signed)
[facility] PROGRESS NOTE   Linda Schmitt TGP:[id number] DOB: 06-01-1956 DOA: 02/17/2020  PCP: Burnard Hawthorne, FNP  Brief History/Interval Summary: 64 y.o. female with a known history of HLD, two weeks s/p elective right shoulder surgery presents to the emergency department for evaluation of SOB, chest pain.  Patient was in her usual state of health until about 1-2 weeks  prior to admission when she describes progressively worsening shortness of breath.  Pain and shortness of breath started out as mild.  Pain is localized, described as pressure, is intermittent and transient. Of note patient had a fall with a right shoulder dislocation and rotator cuff injury in March 2021.  She had an elective repair on February 01, 2020.  Shortness of breath began shortly after that  Evaluation in the ED revealed a significant left-sided pleural effusion.  Reason for Visit: Left-sided pleural effusion  Consultants: Pulmonology will be consulted  Procedures: Ultrasound-guided thoracentesis requested  Antibiotics: Anti-infectives (From admission, onward)   None      Subjective/Interval History: Has some shortness of breath at rest.  Denies any cough.  Occasional left-sided chest discomfort but none currently.  No nausea vomiting.    Assessment/Plan:  Left-sided pleural effusion with compression of left lung Plan is for thoracentesis today with fluid to be sent for analysis including cell count cultures glucose protein amylase cytology LDH.  Patient will also need pulmonology input.  No clear evidence for infection.  Reason for presentation is not entirely clear.  Patient does have a history of smoking cigarettes.  She quit about 2 years ago.  She smoked for many years at 1 pack/day.  She also has been exposed to passive smoking.  No history of any inflammatory or autoimmune conditions such as rheumatoid arthritis or SLE.  COVID-19 test was negative.  Chest pain Most likely due to  pleural effusion.  Troponins are normal.  BNP is normal.  LDL 84.  TSH 0.96.  History of hyperlipidemia Continue pravastatin  Tobacco abuse in the past Quit smoking about 2 years ago.  Was smoking 1 pack/day and smoked for at least 30 years.  Recent right shoulder surgery This was on June 25 by Dr. Posey Pronto.  Right arm is currently in a sling.  Obesity Estimated body mass index is 40.61 kg/m as calculated from the following:   Height as of this encounter: 5\' 3"  (1.6 m).   Weight as of this encounter: 104 kg.   DVT Prophylaxis: SCDs only for now.  Will start Lovenox tomorrow. Code Status: Full code Family Communication: Discussed with the patient Disposition Plan:  Status is: Observation  The patient will require care spanning > 2 midnights and should be moved to inpatient because: Ongoing diagnostic testing needed not appropriate for outpatient work up and Inpatient level of care appropriate due to severity of illness  Dispo: The patient is from: Home              Anticipated d/c is to: Home              Anticipated d/c date is: 2 days              Patient currently is not medically stable to d/c.    Medications:  Scheduled: . cholecalciferol  800 Units Oral Daily  . gabapentin  100 mg Oral QHS  . pravastatin  40 mg Oral q1800  . sodium chloride flush  3 mL Intravenous Once  . vitamin B-12  1,000 mcg Oral Daily  Continuous:  IDP:OEUMPNTIRWERX **OR** acetaminophen, bisacodyl, ipratropium-albuterol, morphine injection, ondansetron **OR** ondansetron (ZOFRAN) IV, oxyCODONE, senna-docusate   Objective:  Vital Signs  Vitals:   02/17/20 2150 02/18/20 0020 02/18/20 0417 02/18/20 0817  BP: (!) 142/96 (!) 148/85 (!) 145/93 135/74  Pulse: (!) 112 (!) 102 89 93  Resp: (!) 22 18 20 16   Temp:  98.2 F (36.8 C) 98.2 F (36.8 C) 97.7 F (36.5 C)  TempSrc:  Oral Oral Oral  SpO2: 95% 96% 98% 96%  Weight:      Height:        Intake/Output Summary (Last 24 hours) at  02/18/2020 1124 Last data filed at 02/18/2020 0417 Gross per 24 hour  Intake --  Output 0 ml  Net 0 ml   Filed Weights   02/17/20 1717  Weight: 104 kg    General appearance: Awake alert.  In no distress Resp: Creased air entry on the left.  Dullness to percussion.  Few crackles.  No wheezing or rhonchi. Cardio: S1-S2 is normal regular.  No S3-S4.  No rubs murmurs or bruit GI: Abdomen is soft.  Nontender nondistended.  Bowel sounds are present normal.  No masses organomegaly Extremities: No edema.  Full range of motion of lower extremities. Neurologic: Alert and oriented x3.  No focal neurological deficits.    Lab Results:  Data Reviewed: I have personally reviewed following labs and imaging studies  CBC: Recent Labs  Lab 02/17/20 1739 02/18/20 0528  WBC 11.5* 9.1  HGB 12.8 11.9*  HCT 39.5 35.4*  MCV 87.6 86.8  PLT 297 540    Basic Metabolic Panel: Recent Labs  Lab 02/17/20 1739 02/17/20 2344 02/18/20 0528  NA 140  --  142  K 3.9  --  4.0  CL 106  --  109  CO2 25  --  29  GLUCOSE 143*  --  127*  BUN 11  --  9  CREATININE 0.89  --  0.83  CALCIUM 9.1  --  8.7*  MG  --  1.7  --   PHOS  --  3.5  --     GFR: Estimated Creatinine Clearance: 76.8 mL/min (by C-G formula based on SCr of 0.83 mg/dL).   Coagulation Profile: Recent Labs  Lab 02/17/20 2344  INR 1.1    Lipid Profile: Recent Labs    02/17/20 2344  CHOL 149  HDL 45  LDLCALC 84  TRIG 102  CHOLHDL 3.3    Thyroid Function Tests: Recent Labs    02/17/20 2344  TSH 0.964     Recent Results (from the past 240 hour(s))  SARS Coronavirus 2 by RT PCR (hospital order, performed in [facility] hospital lab) Nasopharyngeal Nasopharyngeal Swab     Status: None   Collection Time: 02/17/20  6:44 PM   Specimen: Nasopharyngeal Swab  Result Value Ref Range Status   SARS Coronavirus 2 NEGATIVE NEGATIVE Final    Comment: (NOTE) SARS-CoV-2 target nucleic acids are NOT DETECTED.  The SARS-CoV-2 RNA  is generally detectable in upper and lower respiratory specimens during the acute phase of infection. The lowest concentration of SARS-CoV-2 viral copies this assay can detect is 250 copies / mL. A negative result does not preclude SARS-CoV-2 infection and should not be used as the sole basis for treatment or other patient management decisions.  A negative result may occur with improper specimen collection / handling, submission of specimen other than nasopharyngeal swab, presence of viral mutation(s) within the areas targeted by this assay, and  inadequate number of viral copies (<250 copies / mL). A negative result must be combined with clinical observations, patient history, and epidemiological information.  Fact Sheet for Patients:   StrictlyIdeas.no  Fact Sheet for Healthcare Providers: BankingDealers.co.za  This test is not yet approved or  cleared by the [place] FDA and has been authorized for detection and/or diagnosis of SARS-CoV-2 by FDA under an Emergency Use Authorization (EUA).  This EUA will remain in effect (meaning this test can be used) for the duration of the COVID-19 declaration under Section 564(b)(1) of the Act, 21 U.S.C. section 360bbb-3(b)(1), unless the authorization is terminated or revoked sooner.  Performed at [facility], [address] [place], [place] [address]       Radiology Studies: CT Chest Wo Contrast  Result Date: 02/17/2020 CLINICAL DATA:  64 year old female with pleural effusion. EXAM: CT CHEST WITHOUT CONTRAST TECHNIQUE: Multidetector CT imaging of the chest was performed following the standard protocol without IV contrast. COMPARISON:  Chest radiograph dated 02/17/2020 and CT dated 10/11/2019. FINDINGS: Evaluation of this exam is limited in the absence of intravenous contrast. Cardiovascular: There is no cardiomegaly or pericardial effusion. Minimal atherosclerotic calcification  of the aortic arch. The aorta and central pulmonary arteries are otherwise grossly unremarkable on this noncontrast CT. Mediastinum/Nodes: There is slight shift of the mediastinum to the right of the midline secondary to mass effect caused by pleural effusion. No definite hilar or mediastinal adenopathy. Evaluation however is very limited in the absence of contrast as well as due to consolidative changes of the left lung. The esophagus and the thyroid gland are grossly unremarkable. No mediastinal fluid collection. Lungs/Pleura: There is a large left pleural effusion with complete consolidative changes of the left lower lobe and majority of the left upper lobe which may represent compressive atelectasis or infiltrate. Underlying mass is not excluded. Clinical correlation and follow-up recommended. Thoracentesis may provide diagnostic information and symptomatic relief. These findings are new since the study of 10/11/2019. The right lung is clear. There is no pneumothorax. The central airways are patent. Upper Abdomen: Indeterminate 15 mm left adrenal nodule. Musculoskeletal: No chest wall mass or suspicious bone lesions identified. IMPRESSION: Large left pleural effusion with consolidative changes of the majority of the left lung, new since the prior CT of 10/11/2019. Clinical correlation and follow-up to resolution recommended. Thoracentesis may provide additional diagnostic information and symptomatic relief. Electronically Signed   By: Anner Crete M.D.   On: 02/17/2020 19:14   DG Chest Port 1 View  Result Date: 02/17/2020 CLINICAL DATA:  Chest pain and shortness of breath EXAM: PORTABLE CHEST 1 VIEW COMPARISON:  05/23/2016, 10/11/2019, 01/09/2020 FINDINGS: Single frontal view of the chest demonstrates interval opacification of the lower 2/3 of the left hemithorax, consistent with effusion and consolidation. Right chest is clear. No pneumothorax. No acute bony abnormalities. Chronic Hill-Sachs deformity  and degenerative changes of the right shoulder. IMPRESSION: 1. Interval development of significant left lung consolidation and/or effusion. Electronically Signed   By: Randa Ngo M.D.   On: 02/17/2020 18:03       LOS: 0 days   [facility] Pager on [email or web address]  02/18/2020, 11:24 AM

## 2020-02-19 ENCOUNTER — Inpatient Hospital Stay: Payer: Medicare Other

## 2020-02-19 DIAGNOSIS — Z87891 Personal history of nicotine dependence: Secondary | ICD-10-CM

## 2020-02-19 DIAGNOSIS — J910 Malignant pleural effusion: Secondary | ICD-10-CM

## 2020-02-19 DIAGNOSIS — Z9889 Other specified postprocedural states: Secondary | ICD-10-CM

## 2020-02-19 LAB — ACID FAST SMEAR (AFB, MYCOBACTERIA): Acid Fast Smear: NEGATIVE

## 2020-02-19 LAB — COMPREHENSIVE METABOLIC PANEL
ALT: 17 U/L (ref 0–44)
AST: 16 U/L (ref 15–41)
Albumin: 3.4 g/dL — ABNORMAL LOW (ref 3.5–5.0)
Alkaline Phosphatase: 40 U/L (ref 38–126)
Anion gap: 10 (ref 5–15)
BUN: 9 mg/dL (ref 8–23)
CO2: 26 mmol/L (ref 22–32)
Calcium: 8.8 mg/dL — ABNORMAL LOW (ref 8.9–10.3)
Chloride: 102 mmol/L (ref 98–111)
Creatinine, Ser: 0.83 mg/dL (ref 0.44–1.00)
GFR calc Af Amer: >60 mL/min (ref >60)
GFR calc non Af Amer: >60 mL/min (ref >60)
Glucose, Bld: 124 mg/dL — ABNORMAL HIGH (ref 70–99)
Potassium: 3.9 mmol/L (ref 3.5–5.1)
Sodium: 138 mmol/L (ref 135–145)
Total Bilirubin: 0.9 mg/dL (ref 0.3–1.2)
Total Protein: 6.5 g/dL (ref 6.5–8.1)

## 2020-02-19 LAB — CBC
HCT: 37.8 % (ref 36.0–46.0)
Hemoglobin: 12 g/dL (ref 12.0–15.0)
MCH: 28.4 pg (ref 26.0–34.0)
MCHC: 31.7 g/dL (ref 30.0–36.0)
MCV: 89.6 fL (ref 80.0–100.0)
Platelets: 262 10*3/uL (ref 150–400)
RBC: 4.22 MIL/uL (ref 3.87–5.11)
RDW: 13.5 % (ref 11.5–15.5)
WBC: 8.7 10*3/uL (ref 4.0–10.5)
nRBC: 0 % (ref 0.0–0.2)

## 2020-02-19 LAB — TRIGLYCERIDES, BODY FLUIDS: Triglycerides, Fluid: 41 mg/dL

## 2020-02-19 LAB — ECHOCARDIOGRAM COMPLETE
Height: 63 in
Weight: 3668.45 oz

## 2020-02-19 MED ORDER — ENOXAPARIN SODIUM 40 MG/0.4ML ~~LOC~~ SOLN
40.0000 mg | Freq: Two times a day (BID) | SUBCUTANEOUS | Status: DC
  Administered 2020-02-19 – 2020-02-20 (×2): 40 mg via SUBCUTANEOUS
  Filled 2020-02-19 (×3): qty 0.4

## 2020-02-19 NOTE — Plan of Care (Signed)
Pt denies pain. Incentive spirometer used by patient satisfactorily. Problem: Education: Goal: Knowledge of General Education information will improve Description: Including pain rating scale, medication(s)/side effects and non-pharmacologic comfort measures Outcome: Progressing   Problem: Health Behavior/Discharge Planning: Goal: Ability to manage health-related needs will improve Outcome: Progressing   Problem: Clinical Measurements: Goal: Ability to maintain clinical measurements within normal limits will improve Outcome: Progressing Goal: Will remain free from infection Outcome: Progressing Goal: Diagnostic test results will improve Outcome: Progressing Goal: Respiratory complications will improve Outcome: Progressing Goal: Cardiovascular complication will be avoided Outcome: Progressing   Problem: Activity: Goal: Risk for activity intolerance will decrease Outcome: Progressing   Problem: Nutrition: Goal: Adequate nutrition will be maintained Outcome: Progressing   Problem: Coping: Goal: Level of anxiety will decrease Outcome: Progressing   Problem: Elimination: Goal: Will not experience complications related to bowel motility Outcome: Progressing Goal: Will not experience complications related to urinary retention Outcome: Progressing   Problem: Pain Managment: Goal: General experience of comfort will improve Outcome: Progressing   Problem: Safety: Goal: Ability to remain free from injury will improve Outcome: Progressing   Problem: Skin Integrity: Goal: Risk for impaired skin integrity will decrease Outcome: Progressing

## 2020-02-19 NOTE — Progress Notes (Signed)
PT Screen Note  Patient Details Name: Linda Schmitt MRN: [id number] DOB: [birth year]   Cancelled Treatment:    Reason Eval/Treat Not Completed: PT screened, no needs identified, will sign off Pt is 3 weeks post Right RTC repair/decompression and in immobilizer sling.  She reports she has been able to manage alone in the home with assist from niece and sister.  She was easily able to rise to standing and ambulate ~350 ft w/o AD, with community appropriate speed and no overt safety issues.  Pt on room air t/o and able to maintain speed/cadence though she did report some fatigue by the end.  Pt's O2 was 98% at rest pre-ambulation and was 90% by the end.  She was able to get back to the mid 90s in ~1 minute of seated rest, HR also up to ~120 with the effort but dropping with seated rest.  Pt will need continued HHPT for her shoulder but does not require increased care, DME or other considerations.  Will sign off.    Kreg Shropshire, DPT 02/19/2020, 2:19 PM

## 2020-02-19 NOTE — TOC Progression Note (Signed)
Transition of Care [facility]) - Progression Note    Patient Details  Name: CLOTEE SCHLICKER MRN: [id number] Date of Birth: Jul 20, 1956  Transition of Care [facility]) CM/SW [place], RN Phone Number: 02/19/2020, 2:27 PM  Clinical Narrative:    Met with the patient to discuss DC plan and needs She lives at home alone,he had shoulder surgery 2 weeks ago and has Home health set up from that , she can't remember the name of the company but will continue with them She is up to date with her PCP,  She can afford her medications She has transportation from family She stated that she has no needs     Expected Discharge Plan: [place] Barriers to Discharge: Barriers Resolved  Expected Discharge Plan and Services Expected Discharge Plan: [place]   Discharge Planning Services: CM Consult   Living arrangements for the past 2 months: Single Family Home Expected Discharge Date: 02/18/20               DME Arranged: N/A         HH Arranged:  (already has set up from surgery 2 weeks ago)           Social Determinants of Health (SDOH) Interventions    Readmission Risk Interventions No flowsheet data found.

## 2020-02-19 NOTE — Consult Note (Signed)
Pulmonary Medicine          Date: 02/19/2020,   MRN# [id number] Linda Schmitt [birth year]     AdmissionWeight: 104 kg                 CurrentWeight: 104 kg   Referring physician: Dr Maryland Pink    CHIEF COMPLAINT:   SOB/Dyspnea associated with large pleural effusion.    HISTORY OF PRESENT ILLNESS    64 yo F hx of with PMH as below came in due to 2 wk worsening SOB. Of note patient had a fall with a shoulder dislocation and rotator cuff injury in March 2021.  She had right shoulder arthroscopy with debridement on February 01, 2020.  Patient denies fevers/chills, weakness, dizziness, palpitations, N/V/C/D, abdominal pain, dysuria/frequency, changes in mental status.  Otherwise there has been no change in status. Patient has been taking medication as prescribed and there has been no recent change in medication or diet.  No recent antibiotics.  There has been no recent illness, hospitalizations, travel or sick contacts.  She was evaluated in ED and admitted via hospitalist service, noted to have large left pleural effusion.  Pulmonary consultation placed for further evaluation and management.  Pleural fluid studies with lymphocytic predominant transudate thus far. She is a lifelong smoker with 44 year pack years.       PAST MEDICAL HISTORY   Past Medical History:  Diagnosis Date  . HLD (hyperlipidemia)      SURGICAL HISTORY   Past Surgical History:  Procedure Laterality Date  . CARPAL TUNNEL RELEASE Left   . SHOULDER ARTHROSCOPY WITH SUBACROMIAL DECOMPRESSION AND OPEN ROTATOR C Right 02/01/2020   Procedure: RIGHT SHOULDER ARTHROSCOPIC SUBSCAPULARIS REPAIR, ROTATOR CUFF REPAIR, SUABCROMIAL DECOMPRESSION, AND BICEPS TENODESIS.;  Surgeon: Leim Fabry, MD;  Location: [facility];  Service: Orthopedics;  Laterality: Right;  . TUBAL LIGATION       FAMILY HISTORY   Family History  Problem Relation Age of Onset  . Cancer Mother        lymphoma  . Stroke Sister   . Cancer  Brother        lung  . Cancer Sister        lung?  . Breast cancer Cousin 10       maternal     SOCIAL HISTORY   Social History   Tobacco Use  . Smoking status: Former Smoker    Packs/day: 0.75    Years: 40.00    Pack years: 30.00    Types: Cigarettes    Quit date: 02/06/2018    Years since quitting: 2.0  . Smokeless tobacco: Never Used  . Tobacco comment: quit 02/2018  Vaping Use  . Vaping Use: Never used  Substance Use Topics  . Alcohol use: Yes    Comment: 2-3 glasses of wine per week  . Drug use: No     MEDICATIONS    Home Medication:    Current Medication:  Current Facility-Administered Medications:  .  acetaminophen (TYLENOL) tablet 650 mg, 650 mg, Oral, Q6H PRN, 650 mg at 02/19/20 0629 **OR** acetaminophen (TYLENOL) suppository 650 mg, 650 mg, Rectal, Q6H PRN, Hugelmeyer, Alexis, DO .  bisacodyl (DULCOLAX) EC tablet 5 mg, 5 mg, Oral, Daily PRN, Hugelmeyer, Alexis, DO .  cholecalciferol (VITAMIN D3) tablet 800 Units, 800 Units, Oral, Daily, Hugelmeyer, Alexis, DO, 800 Units at 02/19/20 0725 .  gabapentin (NEURONTIN) capsule 100 mg, 100 mg, Oral, QHS, Hugelmeyer, Alexis, DO, 100 mg at 02/18/20 2126 .  ipratropium-albuterol (DUONEB) 0.5-2.5 (3) MG/3ML nebulizer solution 3 mL, 3 mL, Nebulization, Q6H PRN, Hugelmeyer, Alexis, DO .  morphine 2 MG/ML injection 1 mg, 1 mg, Intravenous, Q2H PRN, Hugelmeyer, Alexis, DO .  ondansetron (ZOFRAN) tablet 4 mg, 4 mg, Oral, Q6H PRN **OR** ondansetron (ZOFRAN) injection 4 mg, 4 mg, Intravenous, Q6H PRN, Hugelmeyer, Alexis, DO .  oxyCODONE (Oxy IR/ROXICODONE) immediate release tablet 5-10 mg, 5-10 mg, Oral, Q4H PRN, Hugelmeyer, Alexis, DO .  pravastatin (PRAVACHOL) tablet 40 mg, 40 mg, Oral, q1800, Hugelmeyer, Alexis, DO, 40 mg at 02/18/20 1659 .  senna-docusate (Senokot-S) tablet 1 tablet, 1 tablet, Oral, QHS PRN, Hugelmeyer, Alexis, DO .  sodium chloride flush (NS) 0.9 % injection 3 mL, 3 mL, Intravenous, Once, Chyla Mew,  MD .  vitamin B-12 (CYANOCOBALAMIN) tablet 1,000 mcg, 1,000 mcg, Oral, Daily, Hugelmeyer, Alexis, DO, 1,000 mcg at 02/19/20 0725    ALLERGIES   Patient has no known allergies.     REVIEW OF SYSTEMS    Review of Systems:  Gen:  Denies  fever, sweats, chills weigh loss  HEENT: Denies blurred vision, double vision, ear pain, eye pain, hearing loss, nose bleeds, sore throat Cardiac:  No dizziness, chest pain or heaviness, chest tightness,edema Resp:   Denies cough or sputum porduction, shortness of breath,wheezing, hemoptysis,  Gi: Denies swallowing difficulty, stomach pain, nausea or vomiting, diarrhea, constipation, bowel incontinence Gu:  Denies bladder incontinence, burning urine Ext:   Denies Joint pain, stiffness or swelling Skin: Denies  skin rash, easy bruising or bleeding or hives Endoc:  Denies polyuria, polydipsia , polyphagia or weight change Psych:   Denies depression, insomnia or hallucinations   Other:  All other systems negative   VS: BP 125/79 (BP Location: Left Arm)   Pulse 85   Temp 98.5 F (36.9 C) (Oral)   Resp 17   Ht 5\' 3"  (1.6 m)   Wt 104 kg   SpO2 97%   BMI 40.61 kg/m      PHYSICAL EXAM    GENERAL:NAD, no fevers, chills, no weakness no fatigue HEAD: Normocephalic, atraumatic.  EYES: Pupils equal, round, reactive to light. Extraocular muscles intact. No scleral icterus.  MOUTH: Moist mucosal membrane. Dentition intact. No abscess noted.  EAR, NOSE, THROAT: Clear without exudates. No external lesions.  NECK: Supple. No thyromegaly. No nodules. No JVD.  PULMONARY: decreased breath sounds bilaterally.  CARDIOVASCULAR: S1 and S2. Regular rate and rhythm. No murmurs, rubs, or gallops. No edema. Pedal pulses 2+ bilaterally.  GASTROINTESTINAL: Soft, nontender, nondistended. No masses. Positive bowel sounds. No hepatosplenomegaly.  MUSCULOSKELETAL: No swelling, clubbing, or edema. Range of motion full in all extremities.  NEUROLOGIC: Cranial nerves  II through XII are intact. No gross focal neurological deficits. Sensation intact. Reflexes intact.  SKIN: No ulceration, lesions, rashes, or cyanosis. Skin warm and dry. Turgor intact.  PSYCHIATRIC: Mood, affect within normal limits. The patient is awake, alert and oriented x 3. Insight, judgment intact.       IMAGING    CT Chest Wo Contrast  Result Date: 02/17/2020 CLINICAL DATA:  64 year old female with pleural effusion. EXAM: CT CHEST WITHOUT CONTRAST TECHNIQUE: Multidetector CT imaging of the chest was performed following the standard protocol without IV contrast. COMPARISON:  Chest radiograph dated 02/17/2020 and CT dated 10/11/2019. FINDINGS: Evaluation of this exam is limited in the absence of intravenous contrast. Cardiovascular: There is no cardiomegaly or pericardial effusion. Minimal atherosclerotic calcification of the aortic arch. The aorta and central pulmonary arteries are otherwise grossly unremarkable on  this noncontrast CT. Mediastinum/Nodes: There is slight shift of the mediastinum to the right of the midline secondary to mass effect caused by pleural effusion. No definite hilar or mediastinal adenopathy. Evaluation however is very limited in the absence of contrast as well as due to consolidative changes of the left lung. The esophagus and the thyroid gland are grossly unremarkable. No mediastinal fluid collection. Lungs/Pleura: There is a large left pleural effusion with complete consolidative changes of the left lower lobe and majority of the left upper lobe which may represent compressive atelectasis or infiltrate. Underlying mass is not excluded. Clinical correlation and follow-up recommended. Thoracentesis may provide diagnostic information and symptomatic relief. These findings are new since the study of 10/11/2019. The right lung is clear. There is no pneumothorax. The central airways are patent. Upper Abdomen: Indeterminate 15 mm left adrenal nodule. Musculoskeletal: No chest  wall mass or suspicious bone lesions identified. IMPRESSION: Large left pleural effusion with consolidative changes of the majority of the left lung, new since the prior CT of 10/11/2019. Clinical correlation and follow-up to resolution recommended. Thoracentesis may provide additional diagnostic information and symptomatic relief. Electronically Signed   By: Anner Crete M.D.   On: 02/17/2020 19:14   DG Chest Port 1 View  Result Date: 02/18/2020 CLINICAL DATA:  Status post left thoracentesis EXAM: PORTABLE CHEST 1 VIEW COMPARISON:  02/17/2020 FINDINGS: Large left pleural effusion remains, slightly decreased since prior study. No pneumothorax following thoracentesis. Left lower lobe atelectasis. No focal opacity on the right. Heart is normal size. IMPRESSION: Large left pleural effusion remains following thoracentesis. No pneumothorax. Electronically Signed   By: Rolm Baptise M.D.   On: 02/18/2020 12:18   DG Chest Port 1 View  Result Date: 02/17/2020 CLINICAL DATA:  Chest pain and shortness of breath EXAM: PORTABLE CHEST 1 VIEW COMPARISON:  05/23/2016, 10/11/2019, 01/09/2020 FINDINGS: Single frontal view of the chest demonstrates interval opacification of the lower 2/3 of the left hemithorax, consistent with effusion and consolidation. Right chest is clear. No pneumothorax. No acute bony abnormalities. Chronic Hill-Sachs deformity and degenerative changes of the right shoulder. IMPRESSION: 1. Interval development of significant left lung consolidation and/or effusion. Electronically Signed   By: Randa Ngo M.D.   On: 02/17/2020 18:03   [place] OR NERVE BLOCK-IMAGE ONLY [facility])  Result Date: 02/01/2020 There is no interpretation for this exam.  This order is for images obtained during a surgical procedure.  Please See "Surgeries" Tab for more information regarding the procedure.   [place] RT UPPER EXTREM LTD SOFT TISSUE NON VASCULAR  Result Date: 01/29/2020 CLINICAL DATA:  Mass of the right medial  forearm EXAM: ULTRASOUND RIGHT UPPER EXTREMITY LIMITED TECHNIQUE: Ultrasound examination of the upper extremity soft tissues was performed in the area of clinical concern. COMPARISON:  None. FINDINGS: Real-time sonography of the right anterior forearm was performed with a high-frequency linear transducer. No solid or cystic mass. No architectural distortion. IMPRESSION: No sonographic abnormality at the site of clinical concern. Electronically Signed   By: Kathreen Devoid   On: 01/29/2020 16:22   US THORACENTESIS ASP PLEURAL SPACE W/IMG GUIDE  Result Date: 02/18/2020 INDICATION: Patient with history of hyperlipidemia and elective right shoulder surgery performed on February 12, 2020. Patient presents to this facility with shortness of breath and chest pain found to have a pleural effusion. Request is for therapeutic and diagnostic left-sided thoracentesis EXAM: ULTRASOUND GUIDED LEFT THERAPEUTIC AND DIAGNOSTIC THORACENTESIS MEDICATIONS: Lidocaine 1% 10 mL. COMPLICATIONS: None immediate. Patient unable to tolerate additional fluid  removal at this time. PROCEDURE: An ultrasound guided thoracentesis was thoroughly discussed with the patient and questions answered. The benefits, risks, alternatives and complications were also discussed. The patient understands and wishes to proceed with the procedure. Written consent was obtained. Ultrasound was performed to localize and mark an adequate pocket of fluid in the left chest. The area was then prepped and draped in the normal sterile fashion. 1% Lidocaine was used for local anesthesia. Under ultrasound guidance a 6 Fr Safe-T-Centesis catheter was introduced. Thoracentesis was performed. The catheter was removed and a dressing applied. FINDINGS: A total of approximately 1.8 L of serosanguineous fluid was removed. Samples were sent to the laboratory as requested by the clinical team. IMPRESSION: Successful ultrasound guided left-sided therapeutic and diagnostic thoracentesis  yielding 1.8 L of pleural fluid. Read by: Rushie Nyhan, NP Electronically Signed   By: Markus Daft M.D.   On: 02/18/2020 12:38      ASSESSMENT/PLAN   Large left pleural effusion - preliminary report with metastatic malignant effusion -lymphocyte predominant transudate with normal glucose and negative microbiology thus far - have contacted oncologist - Dr Tasia Catchings - patient has never seen oncology in past - 33 pack year smoker - quit 2 years ago  - no prior personal hx of malignancy  - hx of  Cancer  - Mother with lymphoma, sister lung cancer and 2 brothers all passed away with lung cancer, sister who is alive with "bone cancer" - ECOG -PS1 - takes care of all ADLs at this time except recent Left shoulder surgery hindering some activities - Patient felt improvement immediately after thoracetnesis and may be good candidate for tunneled pleural catheter if needed for palliation.  - Discussed with pathologist and oncologist.       Thank you for allowing me to participate in the care of this patient.   Patient/Family are satisfied with care plan and all questions have been answered.  This document was prepared using Dragon voice recognition software and may include unintentional dictation errors.     Ottie Glazier, M.D.  Division of [place]

## 2020-02-19 NOTE — Progress Notes (Signed)
Anticoagulation monitoring(Lovenox):  64 yo  female ordered Lovenox 40 mg Q24h  Filed Weights   02/17/20 1717  Weight: 104 kg (229 lb 4.5 oz)   BMI 40.61   Lab Results  Component Value Date   CREATININE 0.83 02/19/2020   CREATININE 0.83 02/18/2020   CREATININE 0.89 02/17/2020   Estimated Creatinine Clearance: 76.8 mL/min (by C-G formula based on SCr of 0.83 mg/dL). Hemoglobin & Hematocrit     Component Value Date/Time   HGB 11.9 (L) 02/18/2020 0528   HCT 35.4 (L) 02/18/2020 1607     Per Protocol for Patient with estCrcl > 30 ml/min and BMI > 40, will transition to Lovenox 40 mg BID.

## 2020-02-19 NOTE — Procedures (Signed)
Ultrasound-guided  therapeutic left thoracentesis performed yielding 1.2 liters of bloody fluid. No immediate complications. Follow-up chest x-ray pending. Due to pt coughing and chest discomfort only the above amount of fluid was removed today. EBL < 1 cc.

## 2020-02-19 NOTE — Consult Note (Signed)
Hematology/Oncology follow up note [facility] [phone] [phone]  Patient Care Team: Burnard Hawthorne, FNP as PCP - General (Family Medicine)   Name of the patient: Linda Schmitt  [id number]  Jan 17, 1956   Date of visit: 02/19/20 REASON FOR COSULTATION:  Pleural effusion History of presenting illness-  64 y.o. female with PMH listed at below who presents to ER for evaluation of 2 weeks worsening of shortness of breath.  Patient had a fall with right shoulder dislocation and rotator cuff injury in October 14, 2019.  Patient underwent right shoulder arthroscopy with debridement on 02/01/2020. Denies any fever, chills, cough, hemoptysis, unintentional weight loss, chest pain.  No recent infection or hospitalizations.   02/17/2020 CT chest without contrast showed large left pleural effusion with consolidative changes of the majority of the left lung.  New since prior CT in March 2021.  Patient underwent ultrasound-guided thoracentesis and drained 1.8 L of fluid.  Fluid study showed increased fluid LDH 537, fluid protein 4.9, serum LDH 187, serum protein 6.5. Problem fluid cytology showed [place] cells.  Immunostains are pending. Heme-onc was consulted for further evaluation and management.  Patient reports shortness of breath is improved after thoracentesis.  Currently she does not need any oxygen, no shortness of breath at resting.  She has some cough after thoracentesis last night.  Patient is a former smoker, quit smoking 2 years ago.  30-pack-year smoking history.  Family history positive for lung cancer, lymphoma, breast cancer.  No recent colonoscopy done. Had normal screening mammogram on 06/13/2019. Review of Systems  Constitutional: Negative for appetite change, chills, fatigue and fever.  HENT:   Negative for hearing loss and voice change.   Eyes: Negative for eye problems.  Respiratory: Positive for shortness of breath. Negative for  chest tightness and cough.   Cardiovascular: Negative for chest pain.  Gastrointestinal: Negative for abdominal distention, abdominal pain and blood in stool.  Endocrine: Negative for hot flashes.  Genitourinary: Negative for difficulty urinating and frequency.   Musculoskeletal: Negative for arthralgias.  Skin: Negative for itching and rash.  Neurological: Negative for extremity weakness.  Hematological: Negative for adenopathy.  Psychiatric/Behavioral: Negative for confusion.    No Known Allergies  Patient Active Problem List   Diagnosis Date Noted   Pleural effusion 02/17/2020   Mass of arm, right 01/22/2020   Elevated blood pressure reading 12/28/2019   Hand tingling 12/28/2019   Acute pain of right shoulder 12/01/2018   Adrenal adenoma, left 11/11/2017   Atherosclerosis of aorta ([place]) 09/23/2017   Prediabetes 07/09/2017   Tendonitis of wrist, right 07/06/2017   Hyperlipidemia 10/11/2016   Depression, recurrent ([place]) 09/14/2016   Headache 09/14/2016   Routine general medical examination at a health care facility 01/14/2015   Screening for breast cancer 11/12/2013     Past Medical History:  Diagnosis Date   HLD (hyperlipidemia)      Past Surgical History:  Procedure Laterality Date   CARPAL TUNNEL RELEASE Left    SHOULDER ARTHROSCOPY WITH SUBACROMIAL DECOMPRESSION AND OPEN ROTATOR C Right 02/01/2020   Procedure: RIGHT SHOULDER ARTHROSCOPIC SUBSCAPULARIS REPAIR, ROTATOR CUFF REPAIR, SUABCROMIAL DECOMPRESSION, AND BICEPS TENODESIS.;  Surgeon: Leim Fabry, MD;  Location: [facility];  Service: Orthopedics;  Laterality: Right;   TUBAL LIGATION      Social History   Socioeconomic History   Marital status: Single    Spouse name: Not on file   Number of children: Not on file   Years of education: Not on file  Highest education level: Not on file  Occupational History   Not on file  Tobacco Use   Smoking status: Former Smoker    Packs/day:  0.75    Years: 40.00    Pack years: 30.00    Types: Cigarettes    Quit date: 02/06/2018    Years since quitting: 2.0   Smokeless tobacco: Never Used   Tobacco comment: quit 02/2018  Vaping Use   Vaping Use: Never used  Substance and Sexual Activity   Alcohol use: Yes    Comment: 2-3 glasses of wine per week   Drug use: No   Sexual activity: Not on file  Other Topics Concern   Not on file  Social History Narrative   Lives in [place] alone. Work - [employer]   Diet: Regular   Exercise: walking   Social Determinants of Radio broadcast assistant Strain:    Difficulty of Paying Living Expenses:   Food Insecurity: No Food Insecurity   Worried About Charity fundraiser in the Last Year: Never true   Arboriculturist in the Last Year: Never true  Transportation Needs: No Transportation Needs   Lack of Transportation (Medical): No   Lack of Transportation (Non-Medical): No  Physical Activity:    Days of Exercise per Week:    Minutes of Exercise per Session:   Stress: No Stress Concern Present   Feeling of Stress : Not at all  Social Connections:    Frequency of Communication with Friends and Family:    Frequency of Social Gatherings with Friends and Family:    Attends Religious Services:    Active Member of Clubs or Organizations:    Attends Music therapist:    Marital Status:   Intimate Partner Violence: Not At Risk   Fear of Current or Ex-Partner: No   Emotionally Abused: No   Physically Abused: No   Sexually Abused: No     Family History  Problem Relation Age of Onset   Cancer Mother        lymphoma   Stroke Sister    Cancer Brother        lung   Cancer Sister        lung?   Breast cancer Cousin 45       maternal     Current Facility-Administered Medications:    acetaminophen (TYLENOL) tablet 650 mg, 650 mg, Oral, Q6H PRN, 650 mg at 02/19/20 8101 **OR** acetaminophen (TYLENOL) suppository 650 mg, 650 mg, Rectal,  Q6H PRN, Hugelmeyer, Alexis, DO   bisacodyl (DULCOLAX) EC tablet 5 mg, 5 mg, Oral, Daily PRN, Hugelmeyer, Alexis, DO   cholecalciferol (VITAMIN D3) tablet 800 Units, 800 Units, Oral, Daily, Hugelmeyer, Alexis, DO, 800 Units at 02/19/20 0725   gabapentin (NEURONTIN) capsule 100 mg, 100 mg, Oral, QHS, Hugelmeyer, Alexis, DO, 100 mg at 02/18/20 2126   ipratropium-albuterol (DUONEB) 0.5-2.5 (3) MG/3ML nebulizer solution 3 mL, 3 mL, Nebulization, Q6H PRN, Hugelmeyer, Alexis, DO   morphine 2 MG/ML injection 1 mg, 1 mg, Intravenous, Q2H PRN, Hugelmeyer, Alexis, DO   ondansetron (ZOFRAN) tablet 4 mg, 4 mg, Oral, Q6H PRN **OR** ondansetron (ZOFRAN) injection 4 mg, 4 mg, Intravenous, Q6H PRN, Hugelmeyer, Alexis, DO   oxyCODONE (Oxy IR/ROXICODONE) immediate release tablet 5-10 mg, 5-10 mg, Oral, Q4H PRN, Hugelmeyer, Alexis, DO   pravastatin (PRAVACHOL) tablet 40 mg, 40 mg, Oral, q1800, Hugelmeyer, Alexis, DO, 40 mg at 02/18/20 1659   senna-docusate (Senokot-S) tablet 1 tablet, 1 tablet, Oral,  QHS PRN, Hugelmeyer, Alexis, DO   sodium chloride flush (NS) 0.9 % injection 3 mL, 3 mL, Intravenous, Once, Cobie Mew, MD   vitamin B-12 (CYANOCOBALAMIN) tablet 1,000 mcg, 1,000 mcg, Oral, Daily, Hugelmeyer, Alexis, DO, 1,000 mcg at 02/19/20 0725   Physical exam:  Vitals:   02/18/20 2013 02/19/20 0004 02/19/20 0411 02/19/20 0730  BP: 123/77 130/80 124/83 125/79  Pulse: 99 92 99 85  Resp: 18 18 16 17   Temp: 98.5 F (36.9 C) 98 F (36.7 C) 98.4 F (36.9 C) 98.5 F (36.9 C)  TempSrc: Oral Oral Oral Oral  SpO2: 94% 98% 97% 97%  Weight:      Height:       Physical Exam Constitutional:      General: She is not in acute distress.    Appearance: She is not diaphoretic.  HENT:     Head: Normocephalic and atraumatic.     Nose: Nose normal.     Mouth/Throat:     Pharynx: No oropharyngeal exudate.  Eyes:     General: No scleral icterus.    Pupils: Pupils are equal, round, and reactive to  light.  Cardiovascular:     Rate and Rhythm: Normal rate and regular rhythm.     Heart sounds: No murmur heard.   Pulmonary:     Effort: Pulmonary effort is normal. No respiratory distress.     Breath sounds: Examination of the left-lower field reveals decreased breath sounds. Decreased breath sounds present. No wheezing or rales.  Chest:     Chest wall: No tenderness.  Abdominal:     General: There is no distension.     Palpations: Abdomen is soft.     Tenderness: There is no abdominal tenderness.  Musculoskeletal:        General: Normal range of motion.     Cervical back: Normal range of motion and neck supple.  Skin:    General: Skin is warm and dry.     Findings: No erythema.  Neurological:     General: No focal deficit present.     Mental Status: She is alert and oriented to person, place, and time.     Cranial Nerves: No cranial nerve deficit.     Motor: No abnormal muscle tone.     Coordination: Coordination normal.  Psychiatric:        Mood and Affect: Affect normal.         CMP Latest Ref Rng & Units 02/19/2020  Glucose 70 - 99 mg/dL 124(H)  BUN 8 - 23 mg/dL 9  Creatinine 0.44 - 1.00 mg/dL 0.83  Sodium 135 - 145 mmol/L 138  Potassium 3.5 - 5.1 mmol/L 3.9  Chloride 98 - 111 mmol/L 102  CO2 22 - 32 mmol/L 26  Calcium 8.9 - 10.3 mg/dL 8.8(L)  Total Protein 6.5 - 8.1 g/dL 6.5  Total Bilirubin 0.3 - 1.2 mg/dL 0.9  Alkaline Phos 38 - 126 U/L 40  AST 15 - 41 U/L 16  ALT 0 - 44 U/L 17   CBC Latest Ref Rng & Units 02/18/2020  WBC 4.0 - 10.5 K/uL 9.1  Hemoglobin 12.0 - 15.0 g/dL 11.9(L)  Hematocrit 36 - 46 % 35.4(L)  Platelets 150 - 400 K/uL 247    RADIOGRAPHIC STUDIES: I have personally reviewed the radiological images as listed and agreed with the findings in the report. CT Chest Wo Contrast  Result Date: 02/17/2020 CLINICAL DATA:  64 year old female with pleural effusion. EXAM: CT CHEST WITHOUT CONTRAST TECHNIQUE: Multidetector  CT imaging of the chest was  performed following the standard protocol without IV contrast. COMPARISON:  Chest radiograph dated 02/17/2020 and CT dated 10/11/2019. FINDINGS: Evaluation of this exam is limited in the absence of intravenous contrast. Cardiovascular: There is no cardiomegaly or pericardial effusion. Minimal atherosclerotic calcification of the aortic arch. The aorta and central pulmonary arteries are otherwise grossly unremarkable on this noncontrast CT. Mediastinum/Nodes: There is slight shift of the mediastinum to the right of the midline secondary to mass effect caused by pleural effusion. No definite hilar or mediastinal adenopathy. Evaluation however is very limited in the absence of contrast as well as due to consolidative changes of the left lung. The esophagus and the thyroid gland are grossly unremarkable. No mediastinal fluid collection. Lungs/Pleura: There is a large left pleural effusion with complete consolidative changes of the left lower lobe and majority of the left upper lobe which may represent compressive atelectasis or infiltrate. Underlying mass is not excluded. Clinical correlation and follow-up recommended. Thoracentesis may provide diagnostic information and symptomatic relief. These findings are new since the study of 10/11/2019. The right lung is clear. There is no pneumothorax. The central airways are patent. Upper Abdomen: Indeterminate 15 mm left adrenal nodule. Musculoskeletal: No chest wall mass or suspicious bone lesions identified. IMPRESSION: Large left pleural effusion with consolidative changes of the majority of the left lung, new since the prior CT of 10/11/2019. Clinical correlation and follow-up to resolution recommended. Thoracentesis may provide additional diagnostic information and symptomatic relief. Electronically Signed   By: Anner Crete M.D.   On: 02/17/2020 19:14   DG Chest Port 1 View  Result Date: 02/18/2020 CLINICAL DATA:  Status post left thoracentesis EXAM: PORTABLE  CHEST 1 VIEW COMPARISON:  02/17/2020 FINDINGS: Large left pleural effusion remains, slightly decreased since prior study. No pneumothorax following thoracentesis. Left lower lobe atelectasis. No focal opacity on the right. Heart is normal size. IMPRESSION: Large left pleural effusion remains following thoracentesis. No pneumothorax. Electronically Signed   By: Rolm Baptise M.D.   On: 02/18/2020 12:18   DG Chest Port 1 View  Result Date: 02/17/2020 CLINICAL DATA:  Chest pain and shortness of breath EXAM: PORTABLE CHEST 1 VIEW COMPARISON:  05/23/2016, 10/11/2019, 01/09/2020 FINDINGS: Single frontal view of the chest demonstrates interval opacification of the lower 2/3 of the left hemithorax, consistent with effusion and consolidation. Right chest is clear. No pneumothorax. No acute bony abnormalities. Chronic Hill-Sachs deformity and degenerative changes of the right shoulder. IMPRESSION: 1. Interval development of significant left lung consolidation and/or effusion. Electronically Signed   By: Randa Ngo M.D.   On: 02/17/2020 18:03   [place] OR NERVE BLOCK-IMAGE ONLY [facility])  Result Date: 02/01/2020 There is no interpretation for this exam.  This order is for images obtained during a surgical procedure.  Please See "Surgeries" Tab for more information regarding the procedure.   [place] RT UPPER EXTREM LTD SOFT TISSUE NON VASCULAR  Result Date: 01/29/2020 CLINICAL DATA:  Mass of the right medial forearm EXAM: ULTRASOUND RIGHT UPPER EXTREMITY LIMITED TECHNIQUE: Ultrasound examination of the upper extremity soft tissues was performed in the area of clinical concern. COMPARISON:  None. FINDINGS: Real-time sonography of the right anterior forearm was performed with a high-frequency linear transducer. No solid or cystic mass. No architectural distortion. IMPRESSION: No sonographic abnormality at the site of clinical concern. Electronically Signed   By: Kathreen Devoid   On: 01/29/2020 16:22   US THORACENTESIS ASP  PLEURAL SPACE W/IMG GUIDE  Result Date: 02/18/2020  INDICATION: Patient with history of hyperlipidemia and elective right shoulder surgery performed on February 12, 2020. Patient presents to this facility with shortness of breath and chest pain found to have a pleural effusion. Request is for therapeutic and diagnostic left-sided thoracentesis EXAM: ULTRASOUND GUIDED LEFT THERAPEUTIC AND DIAGNOSTIC THORACENTESIS MEDICATIONS: Lidocaine 1% 10 mL. COMPLICATIONS: None immediate. Patient unable to tolerate additional fluid removal at this time. PROCEDURE: An ultrasound guided thoracentesis was thoroughly discussed with the patient and questions answered. The benefits, risks, alternatives and complications were also discussed. The patient understands and wishes to proceed with the procedure. Written consent was obtained. Ultrasound was performed to localize and mark an adequate pocket of fluid in the left chest. The area was then prepped and draped in the normal sterile fashion. 1% Lidocaine was used for local anesthesia. Under ultrasound guidance a 6 Fr Safe-T-Centesis catheter was introduced. Thoracentesis was performed. The catheter was removed and a dressing applied. FINDINGS: A total of approximately 1.8 L of serosanguineous fluid was removed. Samples were sent to the laboratory as requested by the clinical team. IMPRESSION: Successful ultrasound guided left-sided therapeutic and diagnostic thoracentesis yielding 1.8 L of pleural fluid. Read by: Rushie Nyhan, NP Electronically Signed   By: Markus Daft M.D.   On: 02/18/2020 12:38    Assessment and plan- Patient is a 64 y.o. female with history of hyperlipidemia, former smoker presented for evaluation of shortness of breath.  Work-up showed large left pleural effusion.  Status post diagnostic and therapeutic thoracentesis.  #Large left pleural effusion, status post diagnostic and therapeutic thoracentesis and drained 1.8 L of fluid. CT images were reviewed by me   post thoracentesis x-ray was reviewed.  There is residual large amount of left pleural fluid. Shortness of breath has improved after procedure. Discussed with pulmonology and pathology, prelim cytology is positive for malignant cells.  IHC is pending  Fluid studies meets light criteria for exudate Differential includes primary lung cancer with metastatic pleural effusion versus other primaries with metastatic pleural effusions. Patient had a nonremarkable lung cancer CT noncontrast study just a few months ago. Awaiting final pathology results. Recommend PET scan for staging and also look for other primaries.  MRI brain with and without contrast depending on pathology. She may need future recurrent thoracentesis given the large amount of residual fluid as well as the high risk of reaccumulation of pleural fluid.  Recommend to proceed with another thoracentesis before she gets discharged.   She needs to follow-up with me outpatient closely.  If she requires more than 2 thoracentesis, she may benefit from placement of pleural catheter. Discussed with patient about above plans.  She is in agreement.  Updated pulmonology and hospitalist.   Thank you for allowing me to participate in the care of this patient.  Total face to face encounter time for this patient visit was 70 min. >50% of the time was  spent in counseling and coordination of care.    Earlie Server, MD, PhD Hematology Oncology [facility] at [facility] Pager- [phone] 02/19/2020

## 2020-02-19 NOTE — Progress Notes (Signed)
[facility] PROGRESS NOTE   MARJA ADDERLEY PQZ:[id number] DOB: 02-20-1956 DOA: 02/17/2020  PCP: Burnard Hawthorne, FNP  Brief History/Interval Summary: 64 y.o. female with a known history of HLD, two weeks s/p elective right shoulder surgery presents to the emergency department for evaluation of SOB, chest pain.  Patient was in her usual state of health until about 1-2 weeks  prior to admission when she describes progressively worsening shortness of breath.  Pain and shortness of breath started out as mild.  Pain is localized, described as pressure, is intermittent and transient. Of note patient had a fall with a right shoulder dislocation and rotator cuff injury in March 2021.  She had an elective repair on February 01, 2020.  Shortness of breath began shortly after that  Evaluation in the ED revealed a significant left-sided pleural effusion.  Reason for Visit: Left-sided pleural effusion  Consultants: Pulmonology   Procedures:  Ultrasound-guided thoracentesis 7/12   Antibiotics: Anti-infectives (From admission, onward)   None      Subjective/Interval History: Patient reports occasional shortness of breath relieved with oxygen.  Denies any chest pain.  No nausea vomiting.      Assessment/Plan:  Left-sided pleural effusion with compression of left lung Patient underwent thoracentesis yesterday.  Pleural fluid analysis raises concern for an exudative process.  No clear evidence of infection.  Follow-up on cultures.  Continue to hold on antibiotics.   Patient seen by pulmonology. Received notification from pathology that the malignant cells are noted in the pleural fluid.  Pulmonology has discussed it with patient.  Primary is unknown.  Oncology has been consulted. Patient is a former smoker with more than 30-acute history of smoking.  However she has had negative low-dose CT scans previously.  Chest pain Most likely due to pleural effusion.  Troponins are normal.  BNP  is normal.  LDL 84.  TSH 0.96.  Echocardiogram is pending.  History of hyperlipidemia Continue pravastatin  Tobacco abuse in the past Quit smoking about 2 years ago.  Was smoking 1 pack/day and smoked for at least 30 years.  Recent right shoulder surgery This was on June 25 by Dr. Posey Pronto.  Right arm is currently in a sling.  Obesity Estimated body mass index is 40.61 kg/m as calculated from the following:   Height as of this encounter: 5\' 3"  (1.6 m).   Weight as of this encounter: 104 kg.   DVT Prophylaxis: Lovenox Code Status: Full code Family Communication: Discussed with the patient Disposition Plan: Hopefully return home when improved.  Status is: Inpatient  Remains inpatient appropriate because:Ongoing diagnostic testing needed not appropriate for outpatient work up   Dispo:  Patient From: Home  Planned Disposition: Home  Expected discharge date: 02/20/20  Medically stable for discharge: No      Medications:  Scheduled: . cholecalciferol  800 Units Oral Daily  . gabapentin  100 mg Oral QHS  . pravastatin  40 mg Oral q1800  . sodium chloride flush  3 mL Intravenous Once  . vitamin B-12  1,000 mcg Oral Daily   Continuous:  FHL:KTGYBWLSLHTDS **OR** acetaminophen, bisacodyl, ipratropium-albuterol, morphine injection, ondansetron **OR** ondansetron (ZOFRAN) IV, oxyCODONE, senna-docusate   Objective:  Vital Signs  Vitals:   02/18/20 2013 02/19/20 0004 02/19/20 0411 02/19/20 0730  BP: 123/77 130/80 124/83 125/79  Pulse: 99 92 99 85  Resp: 18 18 16 17   Temp: 98.5 F (36.9 C) 98 F (36.7 C) 98.4 F (36.9 C) 98.5 F (36.9 C)  TempSrc: Oral  Oral Oral Oral  SpO2: 94% 98% 97% 97%  Weight:      Height:        Intake/Output Summary (Last 24 hours) at 02/19/2020 1243 Last data filed at 02/19/2020 1005 Gross per 24 hour  Intake 480 ml  Output --  Net 480 ml   Filed Weights   02/17/20 1717  Weight: 104 kg    General appearance: Awake alert.  In no  distress Resp: Diminished air entry on the left.  Dullness to percussion.  Few crackles.  No wheezing or rhonchi.   Cardio: S1-S2 is normal regular.  No S3-S4.  No rubs murmurs or bruit GI: Abdomen is soft.  Nontender nondistended.  Bowel sounds are present normal.  No masses organomegaly Extremities: No edema.  Right arm is in a splint.  Neurologic: Alert and oriented x3.  No focal neurological deficits.    Lab Results:  Data Reviewed: I have personally reviewed following labs and imaging studies  CBC: Recent Labs  Lab 02/17/20 1739 02/18/20 0528  WBC 11.5* 9.1  HGB 12.8 11.9*  HCT 39.5 35.4*  MCV 87.6 86.8  PLT 297 062    Basic Metabolic Panel: Recent Labs  Lab 02/17/20 1739 02/17/20 2344 02/18/20 0528 02/19/20 0720  NA 140  --  142 138  K 3.9  --  4.0 3.9  CL 106  --  109 102  CO2 25  --  29 26  GLUCOSE 143*  --  127* 124*  BUN 11  --  9 9  CREATININE 0.89  --  0.83 0.83  CALCIUM 9.1  --  8.7* 8.8*  MG  --  1.7  --   --   PHOS  --  3.5  --   --     GFR: Estimated Creatinine Clearance: 76.8 mL/min (by C-G formula based on SCr of 0.83 mg/dL).   Coagulation Profile: Recent Labs  Lab 02/17/20 2344  INR 1.1    Lipid Profile: Recent Labs    02/17/20 2344  CHOL 149  HDL 45  LDLCALC 84  TRIG 102  CHOLHDL 3.3    Thyroid Function Tests: Recent Labs    02/17/20 2344  TSH 0.964     Recent Results (from the past 240 hour(s))  SARS Coronavirus 2 by RT PCR (hospital order, performed in [facility] hospital lab) Nasopharyngeal Nasopharyngeal Swab     Status: None   Collection Time: 02/17/20  6:44 PM   Specimen: Nasopharyngeal Swab  Result Value Ref Range Status   SARS Coronavirus 2 NEGATIVE NEGATIVE Final    Comment: (NOTE) SARS-CoV-2 target nucleic acids are NOT DETECTED.  The SARS-CoV-2 RNA is generally detectable in upper and lower respiratory specimens during the acute phase of infection. The lowest concentration of SARS-CoV-2 viral copies  this assay can detect is 250 copies / mL. A negative result does not preclude SARS-CoV-2 infection and should not be used as the sole basis for treatment or other patient management decisions.  A negative result may occur with improper specimen collection / handling, submission of specimen other than nasopharyngeal swab, presence of viral mutation(s) within the areas targeted by this assay, and inadequate number of viral copies (<250 copies / mL). A negative result must be combined with clinical observations, patient history, and epidemiological information.  Fact Sheet for Patients:   StrictlyIdeas.no  Fact Sheet for Healthcare Providers: BankingDealers.co.za  This test is not yet approved or  cleared by the [place] FDA and has been authorized for  detection and/or diagnosis of SARS-CoV-2 by FDA under an Emergency Use Authorization (EUA).  This EUA will remain in effect (meaning this test can be used) for the duration of the COVID-19 declaration under Section 564(b)(1) of the Act, 21 U.S.C. section 360bbb-3(b)(1), unless the authorization is terminated or revoked sooner.  Performed at [facility], [address] [place], [place] [address]   Body fluid culture     Status: None (Preliminary result)   Collection Time: 02/18/20 11:49 AM   Specimen: PATH Cytology Pleural fluid  Result Value Ref Range Status   Specimen Description   Final    PLEURAL Performed at [facility], [address], [place], [place] [address]    Special Requests   Final    PLEURAL Performed at [facility], [address] [place], [place] [address]    Gram Stain   Final    RARE WBC PRESENT, PREDOMINANTLY MONONUCLEAR NO ORGANISMS SEEN    Culture   Final    NO GROWTH < 24 HOURS Performed at [facility], [place] [address] [place], [place] [address]    Report Status PENDING  Incomplete  Acid Fast Smear (AFB)      Status: None   Collection Time: 02/18/20 11:49 AM   Specimen: PATH Cytology Pleural fluid  Result Value Ref Range Status   AFB Specimen Processing Concentration  Final   Acid Fast Smear Negative  Final    Comment: (NOTE) Performed At: [facility] [place], [place] [id number] Rush Farmer MD GY:[id number]    Source (AFB) PLEURAL  Final    Comment: Performed at [facility], [address], [place], [place] [address]      Radiology Studies: CT Chest Wo Contrast  Result Date: 02/17/2020 CLINICAL DATA:  64 year old female with pleural effusion. EXAM: CT CHEST WITHOUT CONTRAST TECHNIQUE: Multidetector CT imaging of the chest was performed following the standard protocol without IV contrast. COMPARISON:  Chest radiograph dated 02/17/2020 and CT dated 10/11/2019. FINDINGS: Evaluation of this exam is limited in the absence of intravenous contrast. Cardiovascular: There is no cardiomegaly or pericardial effusion. Minimal atherosclerotic calcification of the aortic arch. The aorta and central pulmonary arteries are otherwise grossly unremarkable on this noncontrast CT. Mediastinum/Nodes: There is slight shift of the mediastinum to the right of the midline secondary to mass effect caused by pleural effusion. No definite hilar or mediastinal adenopathy. Evaluation however is very limited in the absence of contrast as well as due to consolidative changes of the left lung. The esophagus and the thyroid gland are grossly unremarkable. No mediastinal fluid collection. Lungs/Pleura: There is a large left pleural effusion with complete consolidative changes of the left lower lobe and majority of the left upper lobe which may represent compressive atelectasis or infiltrate. Underlying mass is not excluded. Clinical correlation and follow-up recommended. Thoracentesis may provide diagnostic information and symptomatic relief. These findings are new since the study of 10/11/2019. The  right lung is clear. There is no pneumothorax. The central airways are patent. Upper Abdomen: Indeterminate 15 mm left adrenal nodule. Musculoskeletal: No chest wall mass or suspicious bone lesions identified. IMPRESSION: Large left pleural effusion with consolidative changes of the majority of the left lung, new since the prior CT of 10/11/2019. Clinical correlation and follow-up to resolution recommended. Thoracentesis may provide additional diagnostic information and symptomatic relief. Electronically Signed   By: Anner Crete M.D.   On: 02/17/2020 19:14   DG Chest [facility]  Result Date: 02/18/2020 CLINICAL DATA:  Status post left thoracentesis EXAM: PORTABLE CHEST 1 VIEW COMPARISON:  02/17/2020 FINDINGS: Large left pleural effusion remains, slightly decreased since prior study. No pneumothorax following thoracentesis. Left lower lobe atelectasis. No focal opacity on the right. Heart is normal size. IMPRESSION: Large left pleural effusion remains following thoracentesis. No pneumothorax. Electronically Signed   By: Rolm Baptise M.D.   On: 02/18/2020 12:18   DG Chest Port 1 View  Result Date: 02/17/2020 CLINICAL DATA:  Chest pain and shortness of breath EXAM: PORTABLE CHEST 1 VIEW COMPARISON:  05/23/2016, 10/11/2019, 01/09/2020 FINDINGS: Single frontal view of the chest demonstrates interval opacification of the lower 2/3 of the left hemithorax, consistent with effusion and consolidation. Right chest is clear. No pneumothorax. No acute bony abnormalities. Chronic Hill-Sachs deformity and degenerative changes of the right shoulder. IMPRESSION: 1. Interval development of significant left lung consolidation and/or effusion. Electronically Signed   By: Randa Ngo M.D.   On: 02/17/2020 18:03   US THORACENTESIS ASP PLEURAL SPACE W/IMG GUIDE  Result Date: 02/18/2020 INDICATION: Patient with history of hyperlipidemia and elective right shoulder surgery performed on February 12, 2020. Patient presents to  this facility with shortness of breath and chest pain found to have a pleural effusion. Request is for therapeutic and diagnostic left-sided thoracentesis EXAM: ULTRASOUND GUIDED LEFT THERAPEUTIC AND DIAGNOSTIC THORACENTESIS MEDICATIONS: Lidocaine 1% 10 mL. COMPLICATIONS: None immediate. Patient unable to tolerate additional fluid removal at this time. PROCEDURE: An ultrasound guided thoracentesis was thoroughly discussed with the patient and questions answered. The benefits, risks, alternatives and complications were also discussed. The patient understands and wishes to proceed with the procedure. Written consent was obtained. Ultrasound was performed to localize and mark an adequate pocket of fluid in the left chest. The area was then prepped and draped in the normal sterile fashion. 1% Lidocaine was used for local anesthesia. Under ultrasound guidance a 6 Fr Safe-T-Centesis catheter was introduced. Thoracentesis was performed. The catheter was removed and a dressing applied. FINDINGS: A total of approximately 1.8 L of serosanguineous fluid was removed. Samples were sent to the laboratory as requested by the clinical team. IMPRESSION: Successful ultrasound guided left-sided therapeutic and diagnostic thoracentesis yielding 1.8 L of pleural fluid. Read by: Rushie Nyhan, NP Electronically Signed   By: Markus Daft M.D.   On: 02/18/2020 12:38       LOS: 1 day   [facility] Pager on [email or web address]  02/19/2020, 12:43 PM

## 2020-02-20 ENCOUNTER — Telehealth: Payer: Self-pay | Admitting: Family

## 2020-02-20 ENCOUNTER — Telehealth: Payer: Self-pay

## 2020-02-20 ENCOUNTER — Encounter: Payer: Self-pay | Admitting: *Deleted

## 2020-02-20 DIAGNOSIS — J90 Pleural effusion, not elsewhere classified: Secondary | ICD-10-CM

## 2020-02-20 LAB — CYTOLOGY - NON PAP

## 2020-02-20 NOTE — Telephone Encounter (Signed)
Patient was admitted into the hospital for Trouble breathing. She states they found fluid on her lungs. Linda Schmitt was told she might have a possibility of cancer due to finding cancerous cells inside of the fluid. Patient is upset that they are sending her off for a full body scan in order to find any cancerous cells. She states her doctors are Dr. Blane Ohara and Deniece Portela. She would like information about her most recent Pap Smears and if there was any signs of cancer.  I scheduled the patient for a telephone visit on 02/22/20 as patient can not drive to be seen in person

## 2020-02-20 NOTE — Telephone Encounter (Signed)
Call pt Please let her know that I have been following along with her hospitalization We can review all labs during our telephone visit

## 2020-02-20 NOTE — Telephone Encounter (Signed)
Patient verbalized understanding and had no further questions.  

## 2020-02-20 NOTE — Discharge Instructions (Signed)
Follow-up with oncology for further work-up of metastatic cancer. Follow-up with PCP in 1 week.

## 2020-02-20 NOTE — Discharge Summary (Signed)
Physician Discharge Summary  Patient ID: Linda Schmitt MRN: [id number] DOB/AGE: 1955/09/04 64 y.o.  Admit date: 02/17/2020 Discharge date: 02/20/2020  Admission Diagnoses:  Discharge Diagnoses:  Active Problems:   Former smoker   Pleural effusion   Status post thoracentesis   Malignant pleural effusion   Discharged Condition: good  Hospital Course:  64 y.o.femalewith a known history of HLD, two weeks s/p elective right shoulder surgerypresents to the emergency department for evaluation of SOB, chest pain.Patient was in her usual state of health until about 1-2weeks prior to admission when she describes progressively worsening shortness of breath. Pain and shortness of breath started out as mild. Pain is localized, described as pressure,is intermittent and transient. Of note patient had a fall with a right shoulder dislocation and rotator cuff injury in March 2021. She had an elective repair on February 01, 2020. Shortness of breath began shortly after that  Evaluation in the ED revealed a significant left-sided pleural effusion. Patient had a ultrasound-guided thoracentesis on 7/12 and 7/13.  Removed 1.8 L and 1.2 L of pleural fluids.  Initial studies suggest malignancy.  Patient has been evaluated by pulmonology as well as oncology.  At this point, patient has clinically improved.  Currently she does not have any short of breath or chest pain. Patient will be followed by oncology for outpatient work-up of primary cancer.  Patient will be followed also by pulmonology in case patient has recurrent pleural effusion.     Consults: pulmonary/intensive care and hematology/oncology  Significant Diagnostic Studies:  CT CHEST WITHOUT CONTRAST  TECHNIQUE: Multidetector CT imaging of the chest was performed following the standard protocol without IV contrast.  COMPARISON:  Chest radiograph dated 02/17/2020 and CT dated 10/11/2019.  FINDINGS: Evaluation of this exam is  limited in the absence of intravenous contrast.  Cardiovascular: There is no cardiomegaly or pericardial effusion. Minimal atherosclerotic calcification of the aortic arch. The aorta and central pulmonary arteries are otherwise grossly unremarkable on this noncontrast CT.  Mediastinum/Nodes: There is slight shift of the mediastinum to the right of the midline secondary to mass effect caused by pleural effusion. No definite hilar or mediastinal adenopathy. Evaluation however is very limited in the absence of contrast as well as due to consolidative changes of the left lung. The esophagus and the thyroid gland are grossly unremarkable. No mediastinal fluid collection.  Lungs/Pleura: There is a large left pleural effusion with complete consolidative changes of the left lower lobe and majority of the left upper lobe which may represent compressive atelectasis or infiltrate. Underlying mass is not excluded. Clinical correlation and follow-up recommended. Thoracentesis may provide diagnostic information and symptomatic relief. These findings are new since the study of 10/11/2019. The right lung is clear. There is no pneumothorax. The central airways are patent.  Upper Abdomen: Indeterminate 15 mm left adrenal nodule.  Musculoskeletal: No chest wall mass or suspicious bone lesions identified.  IMPRESSION: Large left pleural effusion with consolidative changes of the majority of the left lung, new since the prior CT of 10/11/2019. Clinical correlation and follow-up to resolution recommended. Thoracentesis may provide additional diagnostic information and symptomatic relief.   Electronically Signed   By: Anner Crete M.D.   On: 02/17/2020 19:14  Treatments: Thoracentesis.  Discharge Exam: Blood pressure 126/71, pulse 83, temperature 98.5 F (36.9 C), temperature source Oral, resp. rate 18, height 5\' 3"  (1.6 m), weight 104 kg, SpO2 98 %. General appearance: alert and  cooperative Resp: clear to auscultation bilaterally Cardio: regular rate and rhythm,  S1, S2 normal, no murmur, click, rub or gallop GI: soft, non-tender; bowel sounds normal; no masses,  no organomegaly Extremities: extremities normal, atraumatic, no cyanosis or edema  Disposition: Discharge disposition: 01-Home or Self Care       Discharge Instructions    Ambulatory referral to Hematology / Oncology   Complete by: As directed    Diet - low sodium heart healthy   Complete by: As directed    Increase activity slowly   Complete by: As directed      Allergies as of 02/20/2020   No Known Allergies     Medication List    TAKE these medications   acetaminophen 500 MG tablet Commonly known as: TYLENOL Take 2 tablets (1,000 mg total) by mouth every 8 (eight) hours.   Cyanocobalamin 1000 MCG Caps Take 1 capsule by mouth daily.   gabapentin 100 MG capsule Commonly known as: NEURONTIN Take 1 capsule (100 mg total) by mouth 3 (three) times daily. What changed: when to take this   ondansetron 4 MG disintegrating tablet Commonly known as: Zofran ODT Take 1 tablet (4 mg total) by mouth every 8 (eight) hours as needed for nausea or vomiting.   oxyCODONE 5 MG immediate release tablet Commonly known as: Roxicodone Take 1-2 tablets (5-10 mg total) by mouth every 4 (four) hours as needed (pain).   pravastatin 40 MG tablet Commonly known as: PRAVACHOL TAKE 1 TABLET(40 MG) BY MOUTH DAILY What changed:   how much to take  how to take this  when to take this  additional instructions   QC Vitamin D3 10 MCG (400 UNIT) Tabs tablet Generic drug: cholecalciferol Take 800 Units by mouth daily.       Follow-up Information    Burnard Hawthorne, FNP Follow up in 1 week(s).   Specialty: Family Medicine Contact information: [address] [place] [address] [phone]              35 minutes Signed: Sharen Hones 02/20/2020, 10:44 AM

## 2020-02-20 NOTE — Progress Notes (Signed)
  Oncology Nurse Navigator Documentation  Navigator Location: [facility] (02/20/20 1000)   )Navigator Encounter Type: Appt/Treatment Plan Review (02/20/20 1000)   Abnormal Finding Date: 02/17/20 (02/20/20 1000)                     Barriers/Navigation Needs: Coordination of Care (02/20/20 1000)   Interventions: Coordination of Care (02/20/20 1000)   Coordination of Care: Appts;Radiology (02/20/20 1000)       Per Dr. Tasia Catchings, pt needs outpatient brain MRI and PET scan. Orders placed and will initiate authorization process at this time. Will schedule pt for imaging once discharged from the hospital. Will continue to follow.            Time Spent with Patient: 30 (02/20/20 1000)

## 2020-02-20 NOTE — Telephone Encounter (Signed)
[facility] called to schedule a HFU but an appt was already scheduled  For 02/22/20

## 2020-02-20 NOTE — Progress Notes (Signed)
Discharge paperwork was reviewed with the patient at this time. Pt was receptive and understood discharge instructions. Pt was picked by niece and escorted off the unit by nurse in wheelchair. PIV was removed.

## 2020-02-20 NOTE — Plan of Care (Signed)

## 2020-02-21 ENCOUNTER — Telehealth: Payer: Self-pay

## 2020-02-21 ENCOUNTER — Encounter: Payer: Self-pay | Admitting: *Deleted

## 2020-02-21 DIAGNOSIS — Z748 Other problems related to care provider dependency: Secondary | ICD-10-CM

## 2020-02-21 LAB — BODY FLUID CULTURE: Culture: NO GROWTH

## 2020-02-21 LAB — PH, BODY FLUID: pH, Body Fluid: 7.4

## 2020-02-21 NOTE — Progress Notes (Signed)
  Oncology Nurse Navigator Documentation  Navigator Location: [facility] (02/21/20 1500)   )Navigator Encounter Type: Introductory Phone Call (02/21/20 1500)                         Barriers/Navigation Needs: Coordination of Care (02/21/20 1500)   Interventions: Coordination of Care (02/21/20 1500)   Coordination of Care: Appts;Radiology (02/21/20 1500)     phone call made to patient to introduce to navigator services and to review upcoming appts. Pt did not answer. Left message on voicemail with instructions to call back to discuss upcoming appts. Awaiting call back.             Time Spent with Patient: 30 (02/21/20 1500)

## 2020-02-21 NOTE — Telephone Encounter (Signed)
Transition Care Management Follow-up Telephone Call  Date of discharge and from where: 02/20/20 from [facility]  How have you been since you were released from the hospital? Denies chest pain, difficulty breathing and all other symptoms. Spirometer in use 10x every hour. Walking around the house. Physical therapy still in progress for R arm.   Any questions or concerns? No   Items Reviewed:  Did the pt receive and understand the discharge instructions provided? Yes , increase activity as tolerated.  Medications obtained and verified? Yes . No changes.   Any new allergies since your discharge? No   Dietary orders reviewed? Regular diet  Do you have support at home? No   Functional Questionnaire: (I = Independent and D = Dependent) ADLs: i  Bathing/Dressing- i  Meal Prep- i  Eating- i  Maintaining continence- i  Transferring/Ambulation- i  Managing Meds- i  Follow up appointments reviewed:   PCP Hospital f/u appt confirmed? Yes  Scheduled to see [name] on 02/22/20 @ 11:30- virtual.  [facility] f/u appt confirmed? Yes  Scheduled to see Pulmonary on 02/27/20 @ 10:00.  Are transportation arrangements needed? No  not this visit.  If their condition worsens, is the pt aware to call PCP or go to the Emergency Dept.? Yes  Was the patient provided with contact information for the PCP's office or ED? Yes  Was to pt encouraged to call back with questions or concerns? Yes  Nurse notes: Patient scheduled virtual via admin for hospital follow up with PMD. Referral sent to connected care for future medical appointments. Reports difficulty with transportation to medical appointments.

## 2020-02-22 ENCOUNTER — Telehealth (INDEPENDENT_AMBULATORY_CARE_PROVIDER_SITE_OTHER): Payer: Medicare Other | Admitting: Family

## 2020-02-22 DIAGNOSIS — J910 Malignant pleural effusion: Secondary | ICD-10-CM

## 2020-02-22 MED ORDER — ALPRAZOLAM 0.5 MG PO TABS: 0.5000 mg | ORAL_TABLET | Freq: Once | ORAL | 0 refills | Status: AC

## 2020-02-22 NOTE — Progress Notes (Signed)
Verbal consent for services obtained from patient prior to services given to TELEPHONE visit:   Location of call:  provider at work patient at home  Names of all persons present for services: [name], NP and patient Hospitalization follow up.   Left side pain has resolved SOB resolved.  No cp.  She has no concerns today aside from learning more about possible malignancy and source.  She has strong faith which is comforting for her. Has anxiety about pet scan on Monday and would like medication called in. She has never taken xanax before   Patient presented to emergency room 02/17/2020 for shortness of breath.  She was diagnosed with left pleural effusion admitted, discharged 3 days later 02/20/2020. Chest pain at that time Diagnosed with malignant pleural effusion.  She has upcoming appoint with Dr. Tasia Catchings, pending PET scan MRI brain. 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  Provided Xanax to take prior PET scan.  Symptomatically patient feels well. I will continue to closely follow.          I spent 22 min  discussing plan of care over the phone.

## 2020-02-22 NOTE — Assessment & Plan Note (Signed)
Reviewed hospitalization with patient .upcoming appointments with oncology, pulmonology and PET scan.  Provided Xanax to take prior PET scan.  Symptomatically patient feels well. I will continue to closely follow.

## 2020-02-25 ENCOUNTER — Ambulatory Visit
Admit: 2020-02-25 | Discharge: 2020-02-25 | Disposition: A | Discharge status: Home / Self Care | Payer: Medicare Other | Source: Ambulatory Visit | Attending: Oncology | Admitting: Oncology

## 2020-02-25 ENCOUNTER — Other Ambulatory Visit: Payer: Self-pay

## 2020-02-25 DIAGNOSIS — I700 Atherosclerosis of aorta: Secondary | ICD-10-CM | POA: Insufficient documentation

## 2020-02-25 DIAGNOSIS — J90 Pleural effusion, not elsewhere classified: Secondary | ICD-10-CM | POA: Diagnosis not present

## 2020-02-25 LAB — GLUCOSE, CAPILLARY: Glucose-Capillary: 91 mg/dL (ref 70–99)

## 2020-02-25 LAB — COMP PANEL: LEUKEMIA/LYMPHOMA

## 2020-02-25 MED ORDER — FLUDEOXYGLUCOSE F - 18 (FDG) INJECTION
11.9000 | Freq: Once | INTRAVENOUS | Status: AC | PRN
  Administered 2020-02-25: 12.83 via INTRAVENOUS

## 2020-02-26 ENCOUNTER — Inpatient Hospital Stay: Payer: Medicare Other

## 2020-02-26 ENCOUNTER — Encounter: Payer: Self-pay | Admitting: Oncology

## 2020-02-26 ENCOUNTER — Ambulatory Visit
Admission: RE | Admit: 2020-02-26 | Discharge: 2020-02-26 | Disposition: A | Discharge status: Home / Self Care | Payer: Medicare Other | Source: Ambulatory Visit | Attending: Oncology | Admitting: Oncology

## 2020-02-26 ENCOUNTER — Inpatient Hospital Stay: Payer: Medicare Other | Attending: Oncology | Admitting: Oncology

## 2020-02-26 ENCOUNTER — Other Ambulatory Visit: Payer: Self-pay

## 2020-02-26 ENCOUNTER — Other Ambulatory Visit: Payer: Self-pay | Admitting: Oncology

## 2020-02-26 VITALS — BP 145/91 | HR 96 | Temp 97.7°F | Resp 16 | Ht 66.73 in | Wt 233.9 lb

## 2020-02-26 DIAGNOSIS — Z5111 Encounter for antineoplastic chemotherapy: Secondary | ICD-10-CM | POA: Diagnosis not present

## 2020-02-26 DIAGNOSIS — J90 Pleural effusion, not elsewhere classified: Secondary | ICD-10-CM

## 2020-02-26 DIAGNOSIS — C799 Secondary malignant neoplasm of unspecified site: Secondary | ICD-10-CM | POA: Insufficient documentation

## 2020-02-26 DIAGNOSIS — G629 Polyneuropathy, unspecified: Secondary | ICD-10-CM | POA: Diagnosis not present

## 2020-02-26 DIAGNOSIS — Z801 Family history of malignant neoplasm of trachea, bronchus and lung: Secondary | ICD-10-CM | POA: Insufficient documentation

## 2020-02-26 DIAGNOSIS — J910 Malignant pleural effusion: Secondary | ICD-10-CM | POA: Insufficient documentation

## 2020-02-26 DIAGNOSIS — Z803 Family history of malignant neoplasm of breast: Secondary | ICD-10-CM | POA: Diagnosis not present

## 2020-02-26 DIAGNOSIS — Z79899 Other long term (current) drug therapy: Secondary | ICD-10-CM | POA: Diagnosis not present

## 2020-02-26 DIAGNOSIS — Z87891 Personal history of nicotine dependence: Secondary | ICD-10-CM | POA: Diagnosis not present

## 2020-02-26 DIAGNOSIS — C481 Malignant neoplasm of specified parts of peritoneum: Secondary | ICD-10-CM | POA: Insufficient documentation

## 2020-02-26 DIAGNOSIS — Z7189 Other specified counseling: Secondary | ICD-10-CM | POA: Diagnosis not present

## 2020-02-26 DIAGNOSIS — R971 Elevated cancer antigen 125 [CA 125]: Secondary | ICD-10-CM | POA: Insufficient documentation

## 2020-02-26 DIAGNOSIS — K668 Other specified disorders of peritoneum: Secondary | ICD-10-CM | POA: Diagnosis not present

## 2020-02-26 NOTE — Progress Notes (Signed)
Patient denies new problems/concerns today.   °

## 2020-02-26 NOTE — Progress Notes (Addendum)
Hematology/Oncology Follow Up Note [facility]  Telephone:([phone] [phone]  Patient Care Team: Burnard Hawthorne, FNP as PCP - General (Family Medicine)   Name of the patient: Linda Schmitt  [id number]  10/08/1955   REASON FOR VISIT  follow-up for malignant pleural effusion  INTERVAL HISTORY 64 year old female presents for follow-up of pleural effusion. Patient status post thoracentesis x2 during her recent admission. Cytology was positive for metastatic carcinoma.  TTF-1 Napsin A, GATA3, CDX2 were negative. Positive for PAX 8, which is most often expressing tumors of gynecological and renal origin. Patient was accompanied by her daughter-in-law today. She reports that her breathing status is stable.  Denies any shortness of breath. Denies any chest pain.   Review of Systems  Constitutional: Negative for appetite change, chills, fatigue and fever.  HENT:   Negative for hearing loss and voice change.   Eyes: Negative for eye problems.  Respiratory: Negative for chest tightness and cough.   Cardiovascular: Negative for chest pain.  Gastrointestinal: Negative for abdominal distention, abdominal pain and blood in stool.  Endocrine: Negative for hot flashes.  Genitourinary: Negative for difficulty urinating and frequency.   Musculoskeletal: Negative for arthralgias.  Skin: Negative for itching and rash.  Neurological: Negative for extremity weakness.  Hematological: Negative for adenopathy.  Psychiatric/Behavioral: Negative for confusion.      No Known Allergies   Past Medical History:  Diagnosis Date  . HLD (hyperlipidemia)      Past Surgical History:  Procedure Laterality Date  . CARPAL TUNNEL RELEASE Left   . SHOULDER ARTHROSCOPY WITH SUBACROMIAL DECOMPRESSION AND OPEN ROTATOR C Right 02/01/2020   Procedure: RIGHT SHOULDER ARTHROSCOPIC SUBSCAPULARIS REPAIR, ROTATOR CUFF REPAIR, SUABCROMIAL DECOMPRESSION, AND BICEPS  TENODESIS.;  Surgeon: Leim Fabry, MD;  Location: [facility] ORS;  Service: Orthopedics;  Laterality: Right;  . TUBAL LIGATION      Social History   Socioeconomic History  . Marital status: Single    Spouse name: Not on file  . Number of children: Not on file  . Years of education: Not on file  . Highest education level: Not on file  Occupational History  . Not on file  Tobacco Use  . Smoking status: Former Smoker    Packs/day: 0.75    Years: 40.00    Pack years: 30.00    Types: Cigarettes    Quit date: 02/06/2018    Years since quitting: 2.0  . Smokeless tobacco: Never Used  . Tobacco comment: quit 02/2018  Vaping Use  . Vaping Use: Never used  Substance and Sexual Activity  . Alcohol use: Yes    Comment: 2-3 glasses of wine per week  . Drug use: No  . Sexual activity: Not on file  Other Topics Concern  . Not on file  Social History Narrative   Lives in [place] alone. Work - [employer]   Diet: Regular   Exercise: walking   Social Determinants of Radio broadcast assistant Strain:   . Difficulty of Paying Living Expenses:   Food Insecurity: No Landscape architect  . Worried About Charity fundraiser in the Last Year: Never true  . Ran Out of Food in the Last Year: Never true  Transportation Needs: No Transportation Needs  . Lack of Transportation (Medical): No  . Lack of Transportation (Non-Medical): No  Physical Activity:   . Days of Exercise per Week:   . Minutes of Exercise per Session:   Stress: No Stress Concern Present  . Feeling  of Stress : Not at all  Social Connections:   . Frequency of Communication with Friends and Family:   . Frequency of Social Gatherings with Friends and Family:   . Attends Religious Services:   . Active Member of Clubs or Organizations:   . Attends Archivist Meetings:   Marland Kitchen Marital Status:   Intimate Partner Violence: Not At Risk  . Fear of Current or Ex-Partner: No  . Emotionally Abused: No  . Physically Abused: No  .  Sexually Abused: No    Family History  Problem Relation Age of Onset  . Cancer Mother        lymphoma  . Stroke Sister   . Cancer Brother        lung  . Cancer Sister        lung?  . Breast cancer Cousin 45       maternal     Current Outpatient Medications:  .  acetaminophen (TYLENOL) 500 MG tablet, Take 2 tablets (1,000 mg total) by mouth every 8 (eight) hours., Disp: 90 tablet, Rfl: 2 .  cholecalciferol (QC VITAMIN D3) 10 MCG (400 UNIT) TABS tablet, Take 800 Units by mouth daily., Disp: , Rfl:  .  Cyanocobalamin 1000 MCG CAPS, Take 1 capsule by mouth daily., Disp: 90 capsule, Rfl: 3 .  gabapentin (NEURONTIN) 100 MG capsule, Take 1 capsule (100 mg total) by mouth 3 (three) times daily. (Patient taking differently: Take 100 mg by mouth at bedtime. Taking 1 QD), Disp: 90 capsule, Rfl: 3 .  pravastatin (PRAVACHOL) 40 MG tablet, TAKE 1 TABLET(40 MG) BY MOUTH DAILY (Patient taking differently: Take 40 mg by mouth at bedtime. ), Disp: 90 tablet, Rfl: 0 .  ondansetron (ZOFRAN ODT) 4 MG disintegrating tablet, Take 1 tablet (4 mg total) by mouth every 8 (eight) hours as needed for nausea or vomiting. (Patient not taking: Reported on 02/26/2020), Disp: 20 tablet, Rfl: 0 .  oxyCODONE (ROXICODONE) 5 MG immediate release tablet, Take 1-2 tablets (5-10 mg total) by mouth every 4 (four) hours as needed (pain). (Patient not taking: Reported on 02/26/2020), Disp: 30 tablet, Rfl: 0  Physical exam:  Vitals:   02/26/20 1433  BP: (!) 145/91  Pulse: 96  Resp: 16  Temp: 97.7 F (36.5 C)  Weight: 233 lb 14.4 oz (106.1 kg)  Height: 5' 6.73" (1.695 m)   Physical Exam Constitutional:      General: She is not in acute distress. HENT:     Head: Normocephalic and atraumatic.  Eyes:     General: No scleral icterus. Cardiovascular:     Rate and Rhythm: Normal rate and regular rhythm.     Heart sounds: Normal heart sounds.  Pulmonary:     Effort: Pulmonary effort is normal. No respiratory distress.      Breath sounds: No wheezing.     Comments: Severely decreased left lower lobe breathing sounds. Abdominal:     General: Bowel sounds are normal. There is no distension.     Palpations: Abdomen is soft.  Musculoskeletal:        General: No deformity. Normal range of motion.     Cervical back: Normal range of motion and neck supple.  Skin:    General: Skin is warm and dry.     Findings: No erythema or rash.  Neurological:     Mental Status: She is alert and oriented to person, place, and time. Mental status is at baseline.     Cranial Nerves: No cranial  nerve deficit.     Coordination: Coordination normal.  Psychiatric:        Mood and Affect: Mood normal.     CMP Latest Ref Rng & Units 02/19/2020  Glucose 70 - 99 mg/dL 124(H)  BUN 8 - 23 mg/dL 9  Creatinine 0.44 - 1.00 mg/dL 0.83  Sodium 135 - 145 mmol/L 138  Potassium 3.5 - 5.1 mmol/L 3.9  Chloride 98 - 111 mmol/L 102  CO2 22 - 32 mmol/L 26  Calcium 8.9 - 10.3 mg/dL 8.8(L)  Total Protein 6.5 - 8.1 g/dL 6.5  Total Bilirubin 0.3 - 1.2 mg/dL 0.9  Alkaline Phos 38 - 126 U/L 40  AST 15 - 41 U/L 16  ALT 0 - 44 U/L 17   CBC Latest Ref Rng & Units 02/19/2020  WBC 4.0 - 10.5 K/uL 8.7  Hemoglobin 12.0 - 15.0 g/dL 12.0  Hematocrit 36 - 46 % 37.8  Platelets 150 - 400 K/uL 262    RADIOGRAPHIC STUDIES: I have personally reviewed the radiological images as listed and agreed with the findings in the report. CT Chest Wo Contrast  Result Date: 02/17/2020 CLINICAL DATA:  64 year old female with pleural effusion. EXAM: CT CHEST WITHOUT CONTRAST TECHNIQUE: Multidetector CT imaging of the chest was performed following the standard protocol without IV contrast. COMPARISON:  Chest radiograph dated 02/17/2020 and CT dated 10/11/2019. FINDINGS: Evaluation of this exam is limited in the absence of intravenous contrast. Cardiovascular: There is no cardiomegaly or pericardial effusion. Minimal atherosclerotic calcification of the aortic arch. The  aorta and central pulmonary arteries are otherwise grossly unremarkable on this noncontrast CT. Mediastinum/Nodes: There is slight shift of the mediastinum to the right of the midline secondary to mass effect caused by pleural effusion. No definite hilar or mediastinal adenopathy. Evaluation however is very limited in the absence of contrast as well as due to consolidative changes of the left lung. The esophagus and the thyroid gland are grossly unremarkable. No mediastinal fluid collection. Lungs/Pleura: There is a large left pleural effusion with complete consolidative changes of the left lower lobe and majority of the left upper lobe which may represent compressive atelectasis or infiltrate. Underlying mass is not excluded. Clinical correlation and follow-up recommended. Thoracentesis may provide diagnostic information and symptomatic relief. These findings are new since the study of 10/11/2019. The right lung is clear. There is no pneumothorax. The central airways are patent. Upper Abdomen: Indeterminate 15 mm left adrenal nodule. Musculoskeletal: No chest wall mass or suspicious bone lesions identified. IMPRESSION: Large left pleural effusion with consolidative changes of the majority of the left lung, new since the prior CT of 10/11/2019. Clinical correlation and follow-up to resolution recommended. Thoracentesis may provide additional diagnostic information and symptomatic relief. Electronically Signed   By: Anner Crete M.D.   On: 02/17/2020 19:14   NM PET Image Initial (PI) Skull Base To Thigh  Result Date: 02/26/2020 CLINICAL DATA:  Initial treatment strategy for malignant pleural effusion. EXAM: NUCLEAR MEDICINE PET SKULL BASE TO THIGH TECHNIQUE: 12.83 mCi F-18 FDG was injected intravenously. Full-ring PET imaging was performed from the skull base to thigh after the radiotracer. CT data was obtained and used for attenuation correction and anatomic localization. Fasting blood glucose: 91 mg/dl  COMPARISON:  None. FINDINGS: Mediastinal blood pool activity: SUV max 3.16 Liver activity: SUV max NA NECK: There is symmetric radiotracer uptake noted within the base of tongue and bilateral tonsillar regions with SUV max of 7.49. No FDG avid lymph nodes identified within the soft  tissues of the neck. Incidental CT findings: none CHEST: No FDG avid axillary or supraclavicular lymph nodes. No FDG avid mediastinal or hilar lymph nodes. Known malignant, loculated left pleural effusion is noted with overlying compressive type atelectasis. This has decreased in volume when compared with pre thoracentesis chest CT dated 02/17/2020. There is a thin rind of mildly FDG avid soft tissue overlying the left lung compatible with malignant pleural effusion. The corresponding SUV max ranges between 3.25 and 2.83. There is a trace amount of right pleural fluid which also exhibits mild FDG uptake within SUV max of 3.15, image 93/3. No suspicious or FDG avid pulmonary nodule or mass identified. Incidental CT findings: Mild aortic atherosclerosis. ABDOMEN/PELVIS: There is no abnormal FDG uptake within the liver, pancreas, spleen, or adrenal glands. Left adrenal nodule measures 1.4 cm and does not exhibit any significant tracer uptake favoring a benign adenoma. No enlarged or FDG avid retroperitoneal or pelvic lymph nodes. FDG avid soft tissue infiltration of the omentum is identified compatible with peritoneal carcinomatosis. Focal area of increased omental soft tissue along the midline of the abdomen measures 4.7 x 2.2 cm and has an SUV max of 7.27. Additional small peritoneal nodules are noted scattered within the upper abdomen including on image 169/3 and image 153/3. No ascites identified. Within the right pelvis abutting the right side of the uterus is a fluid density structure which measures 4.3 x 2.7 cm within SUV max of 4.20. Incidental CT findings: Aortic atherosclerosis. SKELETON: No focal hypermetabolic activity to suggest  skeletal metastasis. Incidental CT findings: none IMPRESSION: 1. Exam shows evidence of peritoneal disease within the abdomen including FDG avid soft tissue infiltration into the omentum. 2. Large, loculated left pleural effusion with thin, FDG avid rind of soft tissue overlying the left lung. This corresponds to known, pathology proven malignant pleural effusion. 3. Small right pleural effusion with mild FDG uptake is equivocal for malignant effusion of the right hemithorax. 4. Fluid density structure within right side of pelvis abutting the right-side of the uterus is noted. This is indeterminate. Further investigation with pelvic sonogram may be helpful. Electronically Signed   By: Kerby Moors M.D.   On: 02/26/2020 15:36   DG Chest Port 1 View  Result Date: 02/19/2020 CLINICAL DATA:  Status post left thoracentesis EXAM: PORTABLE CHEST 1 VIEW COMPARISON:  02/18/2020 FINDINGS: Large left pleural effusion has been partially evacuated. Small residual left pleural effusion persists. Mild left basilar atelectasis results in mild left-sided volume loss. No pneumothorax. Right lung is clear. Cardiac size within normal limits. No acute bone abnormality. IMPRESSION: No pneumothorax following left thoracentesis. Small residual left pleural effusion. Electronically Signed   By: Fidela Salisbury MD   On: 02/19/2020 17:03   DG Chest Port 1 View  Result Date: 02/18/2020 CLINICAL DATA:  Status post left thoracentesis EXAM: PORTABLE CHEST 1 VIEW COMPARISON:  02/17/2020 FINDINGS: Large left pleural effusion remains, slightly decreased since prior study. No pneumothorax following thoracentesis. Left lower lobe atelectasis. No focal opacity on the right. Heart is normal size. IMPRESSION: Large left pleural effusion remains following thoracentesis. No pneumothorax. Electronically Signed   By: Rolm Baptise M.D.   On: 02/18/2020 12:18   DG Chest Port 1 View  Result Date: 02/17/2020 CLINICAL DATA:  Chest pain and shortness  of breath EXAM: PORTABLE CHEST 1 VIEW COMPARISON:  05/23/2016, 10/11/2019, 01/09/2020 FINDINGS: Single frontal view of the chest demonstrates interval opacification of the lower 2/3 of the left hemithorax, consistent with effusion and consolidation. Right  chest is clear. No pneumothorax. No acute bony abnormalities. Chronic Hill-Sachs deformity and degenerative changes of the right shoulder. IMPRESSION: 1. Interval development of significant left lung consolidation and/or effusion. Electronically Signed   By: Randa Ngo M.D.   On: 02/17/2020 18:03   [place] OR NERVE BLOCK-IMAGE ONLY [facility])  Result Date: 02/01/2020 There is no interpretation for this exam.  This order is for images obtained during a surgical procedure.  Please See "Surgeries" Tab for more information regarding the procedure.   ECHOCARDIOGRAM COMPLETE  Result Date: 02/19/2020    ECHOCARDIOGRAM REPORT   Patient Name:   OKEMA ROLLINSON Date of Exam: 02/18/2020 Medical Rec #:  [id number]         Height:       63.0 in Accession #:    [id number]        Weight:       229.3 lb Date of Birth:  1955-08-15         BSA:          2.049 m Patient Age:    55 years          BP:           114/69 mmHg Patient Gender: F                 HR:           87 bpm. Exam Location:  [facility] Procedure: 2D Echo, Cardiac Doppler and Color Doppler Indications:     Chest Pain 786.50  History:         Patient has no prior history of Echocardiogram examinations.                  HLD.  Sonographer:     Alyse Low Roar Referring Phys:  [id number] Bonnielee Haff Diagnosing Phys: Nelva Bush MD IMPRESSIONS  1. Left ventricular ejection fraction, by estimation, is 65 to 70%. The left ventricle has normal function. Left ventricular endocardial border not optimally defined to evaluate regional wall motion. There is mild left ventricular hypertrophy. Left ventricular diastolic parameters are consistent with Grade I diastolic dysfunction (impaired relaxation).  2. Right ventricular systolic  function is normal. The right ventricular size is normal. Tricuspid regurgitation signal is inadequate for assessing PA pressure.  3. The mitral valve was not well visualized. No evidence of mitral valve regurgitation. No evidence of mitral stenosis.  4. The aortic valve was not well visualized. Aortic valve regurgitation is not visualized. No aortic stenosis is present. FINDINGS  Left Ventricle: Left ventricular ejection fraction, by estimation, is 65 to 70%. The left ventricle has normal function. Left ventricular endocardial border not optimally defined to evaluate regional wall motion. The left ventricular internal cavity size was normal in size. There is mild left ventricular hypertrophy. Left ventricular diastolic parameters are consistent with Grade I diastolic dysfunction (impaired relaxation). Right Ventricle: The right ventricular size is normal. No increase in right ventricular wall thickness. Right ventricular systolic function is normal. Tricuspid regurgitation signal is inadequate for assessing PA pressure. Left Atrium: Left atrial size was normal in size. Right Atrium: Right atrial size was normal in size. Pericardium: The pericardium was not well visualized. Mitral Valve: The mitral valve was not well visualized. No evidence of mitral valve regurgitation. No evidence of mitral valve stenosis. Tricuspid Valve: The tricuspid valve is not well visualized. Tricuspid valve regurgitation is trivial. Aortic Valve: The aortic valve was not well visualized. Aortic valve regurgitation is not visualized. No aortic stenosis is  present. Aortic valve mean gradient measures 6.0 mmHg. Aortic valve peak gradient measures 13.5 mmHg. Aortic valve area, by VTI measures 2.16 cm. Pulmonic Valve: The pulmonic valve was not well visualized. Pulmonic valve regurgitation is not visualized. No evidence of pulmonic stenosis. Aorta: The aortic root is normal in size and structure. Pulmonary Artery: The pulmonary artery is not  well seen. Venous: The inferior vena cava was not well visualized. IAS/Shunts: The interatrial septum was not well visualized.  LEFT VENTRICLE PLAX 2D LVIDd:         4.25 cm  Diastology LVIDs:         2.95 cm  LV e' lateral:   7.29 cm/s LV PW:         1.09 cm  LV E/e' lateral: 10.0 LV IVS:        1.09 cm  LV e' medial:    7.18 cm/s LVOT diam:     1.70 cm  LV E/e' medial:  10.2 LV SV:         52 LV SV Index:   25 LVOT Area:     2.27 cm  RIGHT VENTRICLE RV S prime:     14.10 cm/s LEFT ATRIUM             Index       RIGHT ATRIUM          Index LA diam:        3.60 cm 1.76 cm/m  RA Area:     8.68 cm LA Vol (A2C):   36.9 ml 18.01 ml/m RA Volume:   14.90 ml 7.27 ml/m LA Vol (A4C):   27.1 ml 13.22 ml/m LA Biplane Vol: 31.7 ml 15.47 ml/m  AORTIC VALVE                    PULMONIC VALVE AV Area (Vmax):    2.06 cm     PV Vmax:        0.92 m/s AV Area (Vmean):   2.07 cm     PV Peak grad:   3.4 mmHg AV Area (VTI):     2.16 cm     RVOT Peak grad: 4 mmHg AV Vmax:           184.00 cm/s AV Vmean:          104.000 cm/s AV VTI:            0.238 m AV Peak Grad:      13.5 mmHg AV Mean Grad:      6.0 mmHg LVOT Vmax:         167.00 cm/s LVOT Vmean:        94.900 cm/s LVOT VTI:          0.227 m LVOT/AV VTI ratio: 0.95  AORTA Ao Root diam: 2.40 cm MITRAL VALVE MV Area (PHT): 5.13 cm     SHUNTS MV Decel Time: 148 msec     Systemic VTI:  0.23 m MV E velocity: 73.10 cm/s   Systemic Diam: 1.70 cm MV A velocity: 109.00 cm/s MV E/A ratio:  0.67 MV A Prime:    16.4 cm/s Nelva Bush MD Electronically signed by Nelva Bush MD Signature Date/Time: 02/19/2020/1:00:30 PM    Final    [place] RT UPPER EXTREM LTD SOFT TISSUE NON VASCULAR  Result Date: 01/29/2020 CLINICAL DATA:  Mass of the right medial forearm EXAM: ULTRASOUND RIGHT UPPER EXTREMITY LIMITED TECHNIQUE: Ultrasound examination of the upper extremity soft tissues was performed in  the area of clinical concern. COMPARISON:  None. FINDINGS: Real-time sonography of the right  anterior forearm was performed with a high-frequency linear transducer. No solid or cystic mass. No architectural distortion. IMPRESSION: No sonographic abnormality at the site of clinical concern. Electronically Signed   By: Kathreen Devoid   On: 01/29/2020 16:22   US PELVIC COMPLETE WITH TRANSVAGINAL  Result Date: 02/26/2020 CLINICAL DATA:  Malignant pleural effusion, abnormal PET-CT with a cystic structure in the RIGHT adnexa; postmenopausal EXAM: TRANSABDOMINAL AND TRANSVAGINAL ULTRASOUND OF PELVIS TECHNIQUE: Both transabdominal and transvaginal ultrasound examinations of the pelvis were performed. Transabdominal technique was performed for global imaging of the pelvis including uterus, ovaries, adnexal regions, and pelvic cul-de-sac. It was necessary to proceed with endovaginal exam following the transabdominal exam to visualize the uterus, endometrium, and ovaries. COMPARISON:  PET-CT 01/26/2020 FINDINGS: Uterus Measurements: 6.2 x 2.7 x 3.5 cm = volume: 30 mL. Anteverted. Heterogeneous myometrium. No focal mass Endometrium Thickness: 4 mm. Poorly defined. No definite mass or endometrial fluid Right ovary Questionable visualization of RIGHT ovary see below Left ovary Measurements: 1.9 x 0.9 x 1.1 cm = volume: 0.4 mL. Normal morphology without mass Other findings Trace free pelvic fluid. Question cystic lesions seen on prior PET-CT is not definitely visualized. A small focus of soft tissue is identified in the RIGHT adnexa 2.0 x 1.1 x 1.0 cm, suspect representing a small RIGHT ovary. IMPRESSION: Heterogeneous myometrium with poor definition of endometrial complex. Normal LEFT ovary. No cystic RIGHT adnexal lesion is identified, though a probable small normal appearing RIGHT ovary is identified. Suspect that the low-attenuation presume cystic structure seen in the RIGHT adnexa on the prior CT corresponds to a small amount of nonspecific free fluid in the RIGHT adnexa. Electronically Signed   By: Lavonia Dana  M.D.   On: 02/26/2020 17:43   US THORACENTESIS ASP PLEURAL SPACE W/IMG GUIDE  Result Date: 02/19/2020 INDICATION: Prior smoker with history of dyspnea and recurrent malignant left pleural effusion. Request made for therapeutic left thoracentesis. EXAM: ULTRASOUND GUIDED THERAPEUTIC LEFT THORACENTESIS MEDICATIONS: 1% lidocaine to skin and subcutaneous tissue COMPLICATIONS: None immediate. PROCEDURE: An ultrasound guided thoracentesis was thoroughly discussed with the patient and questions answered. The benefits, risks, alternatives and complications were also discussed. The patient understands and wishes to proceed with the procedure. Written consent was obtained. Ultrasound was performed to localize and mark an adequate pocket of fluid in the left chest. The area was then prepped and draped in the normal sterile fashion. 1% Lidocaine was used for local anesthesia. Under ultrasound guidance a 6 Fr Safe-T-Centesis catheter was introduced. Thoracentesis was performed. The catheter was removed and a dressing applied. FINDINGS: A total of approximately 1.2 liters of bloody fluid was removed. Due to patient coughing and chest discomfort only the above amount of fluid was removed today. IMPRESSION: Successful ultrasound guided therapeutic left thoracentesis yielding 1.2 liters of pleural fluid. Read by: Rowe Robert, PA-C Electronically Signed   By: Aletta Edouard M.D.   On: 02/19/2020 16:38   US THORACENTESIS ASP PLEURAL SPACE W/IMG GUIDE  Result Date: 02/18/2020 INDICATION: Patient with history of hyperlipidemia and elective right shoulder surgery performed on February 12, 2020. Patient presents to this facility with shortness of breath and chest pain found to have a pleural effusion. Request is for therapeutic and diagnostic left-sided thoracentesis EXAM: ULTRASOUND GUIDED LEFT THERAPEUTIC AND DIAGNOSTIC THORACENTESIS MEDICATIONS: Lidocaine 1% 10 mL. COMPLICATIONS: None immediate. Patient unable to tolerate  additional fluid removal at this time. PROCEDURE: An  ultrasound guided thoracentesis was thoroughly discussed with the patient and questions answered. The benefits, risks, alternatives and complications were also discussed. The patient understands and wishes to proceed with the procedure. Written consent was obtained. Ultrasound was performed to localize and mark an adequate pocket of fluid in the left chest. The area was then prepped and draped in the normal sterile fashion. 1% Lidocaine was used for local anesthesia. Under ultrasound guidance a 6 Fr Safe-T-Centesis catheter was introduced. Thoracentesis was performed. The catheter was removed and a dressing applied. FINDINGS: A total of approximately 1.8 L of serosanguineous fluid was removed. Samples were sent to the laboratory as requested by the clinical team. IMPRESSION: Successful ultrasound guided left-sided therapeutic and diagnostic thoracentesis yielding 1.8 L of pleural fluid. Read by: Rushie Nyhan, NP Electronically Signed   By: Markus Daft M.D.   On: 02/18/2020 12:38     Assessment and plan Patient is a 64 y.o. female presents for follow-up of pleural effusion 1. Pleural effusion   2. Metastatic carcinoma ([place])   3. Mass of omentum    #Left malignant pleural effusion, Status post thoracentesis x2.  Patient currently is asymptomatic.  Continue close monitor.  She may need repeat thoracentesis. Cytology showed metastatic carcinoma.  Less likely from lung. PET scan was independently reviewed by me and discussed with patient.  I also discussed with radiology. Exam showed evidence of peritoneal disease within the abdomen, soft tissue infiltrating into the omentum.  Large loculated left pleural effusion with thin FDG avid rind of soft tissue overlying the left lung.  Small right pleural effusion with mild FDG uptake is equivocal for malignant effusion of the right hemithorax. Fluid density structure within the right side of the pelvis  abutting the right-sided of uterus is noted.  This is indeterminate.  Further investigation with pelvic sonogram may be helpful.  Pelvic ultrasound was obtained.-Heterogeneous myometrium with poor definition of endometrial complex. Normal left ovary.  No cystic right adnexal lesion is identified.  No a probable small normal-appearing right ovary is identified.  Suspect that the low-attenuation presumed cystic structure seen in the right adnexa on the prior image corresponds to a small amount of nonspecific free fluid in the right adnexa.  I discussed with the patient about the possibility of further tissue biopsy. After reviewing pelvic ultrasound, there is no definitive primary that was found for the source of metastatic carcinoma. I recommend to proceed with CT-guided omental biopsy for further tissue diagnosis. Check CA-125, CA 19-9, CEA  Goals of care, discussed with pt that she likely has metastatic cancer, which is not curable. Treatment is with palliative Intent   Orders Placed This Encounter  Procedures  . CEA    Standing Status:   Future    Number of Occurrences:   1    Standing Expiration Date:   02/25/2021  . CA 125    Standing Status:   Future    Number of Occurrences:   1    Standing Expiration Date:   02/25/2021  . Cancer antigen 19-9    Standing Status:   Future    Number of Occurrences:   1    Standing Expiration Date:   02/25/2021    Follow-up to be determined   Earlie Server, MD, PhD Hematology Oncology [facility]- [phone] 02/26/2020  Addendum, I discussed with gynecology oncology Dr.Secord.  Given that cytology from left pleural effusion is positive for metastatic carcinoma, tumor cells are positive for PAX  8 which indicates GYN origin.  Given that patient has moderate to large amount of pleural effusion, heavy tumor burden, recommend to start chemotherapy as soon as possible with carboplatin AUC 5-6 and Taxol 175 mg/m every  3 weeks for 3 cycles.  Patient will establish care with gynecology oncology for evaluation of participating in clinical trials after neoadjuvant chemo/surgery. I will obtained germline mutation testing.  Patient will be scheduled to attend chemotherapy class. Hopefully start chemotherapy next week.  Zhou.Tasia Catchings 02/27/2020

## 2020-02-27 ENCOUNTER — Other Ambulatory Visit: Payer: Self-pay | Admitting: Oncology

## 2020-02-27 ENCOUNTER — Ambulatory Visit
Admission: RE | Admit: 2020-02-27 | Discharge: 2020-02-27 | Disposition: A | Discharge status: Home / Self Care | Payer: Medicare Other | Source: Ambulatory Visit | Attending: Oncology | Admitting: Oncology

## 2020-02-27 ENCOUNTER — Telehealth: Payer: Self-pay

## 2020-02-27 DIAGNOSIS — K668 Other specified disorders of peritoneum: Secondary | ICD-10-CM

## 2020-02-27 DIAGNOSIS — C799 Secondary malignant neoplasm of unspecified site: Secondary | ICD-10-CM

## 2020-02-27 DIAGNOSIS — J90 Pleural effusion, not elsewhere classified: Secondary | ICD-10-CM | POA: Diagnosis not present

## 2020-02-27 LAB — CA 125: Cancer Antigen (CA) 125: 53.6 U/mL — ABNORMAL HIGH (ref 0.0–38.1)

## 2020-02-27 LAB — CEA: CEA: 0.7 ng/mL (ref 0.0–4.7)

## 2020-02-27 LAB — CANCER ANTIGEN 19-9: CA 19-9: 5 U/mL (ref 0–35)

## 2020-02-27 MED ORDER — GADOBUTROL 1 MMOL/ML IV SOLN
10.0000 mL | Freq: Once | INTRAVENOUS | Status: AC | PRN
  Administered 2020-02-27: 10 mL via INTRAVENOUS

## 2020-02-27 NOTE — Telephone Encounter (Signed)
Urgent request faxed over to specialty scheduling.

## 2020-02-27 NOTE — Progress Notes (Signed)
START ON PATHWAY REGIMEN - Ovarian     A cycle is every 21 days:     Paclitaxel      Carboplatin   **Always confirm dose/schedule in your pharmacy ordering system**  Patient Characteristics: Preoperative or Nonsurgical Candidate (Clinical Staging), Newly Diagnosed, Neoadjuvant Therapy followed by Surgery Therapeutic Status: Preoperative or Nonsurgical Candidate (Clinical Staging) BRCA Mutation Status: Awaiting Test Results AJCC T Category: cTX AJCC 8 Stage Grouping: IV AJCC N Category: cNX AJCC M Category: pM1 Therapy Plan: Neoadjuvant Therapy followed by Surgery Intent of Therapy: Non-Curative / Palliative Intent, Discussed with Patient

## 2020-02-27 NOTE — Telephone Encounter (Signed)
-----   Message from Earlie Server, MD sent at 02/26/2020  9:52 PM EDT ----- Please arrange patient to have CT-guided biopsy done as soon as possible.  Stat order is okay if no availability in 5 days.  Thank you.  I discussed about the possibility with patient.

## 2020-02-27 NOTE — Telephone Encounter (Signed)
Biopsy appt is scheduled for 7/28 @ 10:30 arrive @ 9:30

## 2020-02-27 NOTE — Telephone Encounter (Signed)
Dr. Tasia Catchings has discussed with Dr. Theora Gianotti in [place] [facility] today.  Dr. Tasia Catchings would like for patient to (1) be seen in the [place] [facility] next week (2) chemo class for carbo/taxol (3) lab/MD/carbo/taxol *new* this week or early next week.  Dr. Tasia Catchings is going to call the patient and discuss her plan.  Please go ahead and get her scheduled but don't call her with appt details yet.

## 2020-02-28 ENCOUNTER — Inpatient Hospital Stay: Payer: Medicare Other

## 2020-02-28 ENCOUNTER — Telehealth: Payer: Self-pay | Admitting: *Deleted

## 2020-02-28 DIAGNOSIS — K668 Other specified disorders of peritoneum: Secondary | ICD-10-CM | POA: Insufficient documentation

## 2020-02-28 NOTE — Telephone Encounter (Signed)
Called pt and left her a detailed message making her aware of her *NEW* scheduled 03/04/20 appt time for lab/MD/*NEW*Carbo/Taxol.

## 2020-02-28 NOTE — Telephone Encounter (Signed)
Dr. Tasia Catchings was able to reach patient and dicuss chemotherapy and [place] results. Patient agrees to proceed with chemotheapy. All appt details were given to pt and she states she will pick up AVS on Monday.

## 2020-02-28 NOTE — Patient Instructions (Signed)
Paclitaxel injection What is this medicine? PACLITAXEL (PAK li TAX el) is a chemotherapy drug. It targets fast dividing cells, like cancer cells, and causes these cells to die. This medicine is used to treat ovarian cancer, breast cancer, lung cancer, Kaposi's sarcoma, and other cancers. This medicine may be used for other purposes; ask your health care provider or pharmacist if you have questions. COMMON BRAND NAME(S): Onxol, Taxol What should I tell my health care provider before I take this medicine? They need to know if you have any of these conditions:  history of irregular heartbeat  liver disease  low blood counts, like low white cell, platelet, or red cell counts  lung or breathing disease, like asthma  tingling of the fingers or toes, or other nerve disorder  an unusual or allergic reaction to paclitaxel, alcohol, polyoxyethylated castor oil, other chemotherapy, other medicines, foods, dyes, or preservatives  pregnant or trying to get pregnant  breast-feeding How should I use this medicine? This drug is given as an infusion into a vein. It is administered in a hospital or clinic by a specially trained health care professional. Talk to your pediatrician regarding the use of this medicine in children. Special care may be needed. Overdosage: If you think you have taken too much of this medicine contact a poison control center or emergency room at once. NOTE: This medicine is only for you. Do not share this medicine with others. What if I miss a dose? It is important not to miss your dose. Call your doctor or health care professional if you are unable to keep an appointment. What may interact with this medicine? Do not take this medicine with any of the following medications:  disulfiram  metronidazole This medicine may also interact with the following medications:  antiviral medicines for hepatitis, HIV or AIDS  certain antibiotics like erythromycin and  clarithromycin  certain medicines for fungal infections like ketoconazole and itraconazole  certain medicines for seizures like carbamazepine, phenobarbital, phenytoin  gemfibrozil  nefazodone  rifampin  St. John's wort This list may not describe all possible interactions. Give your health care provider a list of all the medicines, herbs, non-prescription drugs, or dietary supplements you use. Also tell them if you smoke, drink alcohol, or use illegal drugs. Some items may interact with your medicine. What should I watch for while using this medicine? Your condition will be monitored carefully while you are receiving this medicine. You will need important blood work done while you are taking this medicine. This medicine can cause serious allergic reactions. To reduce your risk you will need to take other medicine(s) before treatment with this medicine. If you experience allergic reactions like skin rash, itching or hives, swelling of the face, lips, or tongue, tell your doctor or health care professional right away. In some cases, you may be given additional medicines to help with side effects. Follow all directions for their use. This drug may make you feel generally unwell. This is not uncommon, as chemotherapy can affect healthy cells as well as cancer cells. Report any side effects. Continue your course of treatment even though you feel ill unless your doctor tells you to stop. Call your doctor or health care professional for advice if you get a fever, chills or sore throat, or other symptoms of a cold or flu. Do not treat yourself. This drug decreases your body's ability to fight infections. Try to avoid being around people who are sick. This medicine may increase your risk to bruise   or bleed. Call your doctor or health care professional if you notice any unusual bleeding. Be careful brushing and flossing your teeth or using a toothpick because you may get an infection or bleed more easily.  If you have any dental work done, tell your dentist you are receiving this medicine. Avoid taking products that contain aspirin, acetaminophen, ibuprofen, naproxen, or ketoprofen unless instructed by your doctor. These medicines may hide a fever. Do not become pregnant while taking this medicine. Women should inform their doctor if they wish to become pregnant or think they might be pregnant. There is a potential for serious side effects to an unborn child. Talk to your health care professional or pharmacist for more information. Do not breast-feed an infant while taking this medicine. Men are advised not to father a child while receiving this medicine. This product may contain alcohol. Ask your pharmacist or healthcare provider if this medicine contains alcohol. Be sure to tell all healthcare providers you are taking this medicine. Certain medicines, like metronidazole and disulfiram, can cause an unpleasant reaction when taken with alcohol. The reaction includes flushing, headache, nausea, vomiting, sweating, and increased thirst. The reaction can last from 30 minutes to several hours. What side effects may I notice from receiving this medicine? Side effects that you should report to your doctor or health care professional as soon as possible:  allergic reactions like skin rash, itching or hives, swelling of the face, lips, or tongue  breathing problems  changes in vision  fast, irregular heartbeat  high or low blood pressure  mouth sores  pain, tingling, numbness in the hands or feet  signs of decreased platelets or bleeding - bruising, pinpoint red spots on the skin, black, tarry stools, blood in the urine  signs of decreased red blood cells - unusually weak or tired, feeling faint or lightheaded, falls  signs of infection - fever or chills, cough, sore throat, pain or difficulty passing urine  signs and symptoms of liver injury like dark yellow or brown urine; general ill feeling or  flu-like symptoms; light-colored stools; loss of appetite; nausea; right upper belly pain; unusually weak or tired; yellowing of the eyes or skin  swelling of the ankles, feet, hands  unusually slow heartbeat Side effects that usually do not require medical attention (report to your doctor or health care professional if they continue or are bothersome):  diarrhea  hair loss  loss of appetite  muscle or joint pain  nausea, vomiting  pain, redness, or irritation at site where injected  tiredness This list may not describe all possible side effects. Call your doctor for medical advice about side effects. You may report side effects to FDA at 1-800-FDA-1088. Where should I keep my medicine? This drug is given in a hospital or clinic and will not be stored at home. NOTE: This sheet is a summary. It may not cover all possible information. If you have questions about this medicine, talk to your doctor, pharmacist, or health care provider.  2020 Elsevier/Gold Standard (2017-03-29 13:14:55) Carboplatin injection What is this medicine? CARBOPLATIN (KAR boe pla tin) is a chemotherapy drug. It targets fast dividing cells, like cancer cells, and causes these cells to die. This medicine is used to treat ovarian cancer and many other cancers. This medicine may be used for other purposes; ask your health care provider or pharmacist if you have questions. COMMON BRAND NAME(S): Paraplatin What should I tell my health care provider before I take this medicine? They need to   know if you have any of these conditions:  blood disorders  hearing problems  kidney disease  recent or ongoing radiation therapy  an unusual or allergic reaction to carboplatin, cisplatin, other chemotherapy, other medicines, foods, dyes, or preservatives  pregnant or trying to get pregnant  breast-feeding How should I use this medicine? This drug is usually given as an infusion into a vein. It is administered in a  hospital or clinic by a specially trained health care professional. Talk to your pediatrician regarding the use of this medicine in children. Special care may be needed. Overdosage: If you think you have taken too much of this medicine contact a poison control center or emergency room at once. NOTE: This medicine is only for you. Do not share this medicine with others. What if I miss a dose? It is important not to miss a dose. Call your doctor or health care professional if you are unable to keep an appointment. What may interact with this medicine?  medicines for seizures  medicines to increase blood counts like filgrastim, pegfilgrastim, sargramostim  some antibiotics like amikacin, gentamicin, neomycin, streptomycin, tobramycin  vaccines Talk to your doctor or health care professional before taking any of these medicines:  acetaminophen  aspirin  ibuprofen  ketoprofen  naproxen This list may not describe all possible interactions. Give your health care provider a list of all the medicines, herbs, non-prescription drugs, or dietary supplements you use. Also tell them if you smoke, drink alcohol, or use illegal drugs. Some items may interact with your medicine. What should I watch for while using this medicine? Your condition will be monitored carefully while you are receiving this medicine. You will need important blood work done while you are taking this medicine. This drug may make you feel generally unwell. This is not uncommon, as chemotherapy can affect healthy cells as well as cancer cells. Report any side effects. Continue your course of treatment even though you feel ill unless your doctor tells you to stop. In some cases, you may be given additional medicines to help with side effects. Follow all directions for their use. Call your doctor or health care professional for advice if you get a fever, chills or sore throat, or other symptoms of a cold or flu. Do not treat  yourself. This drug decreases your body's ability to fight infections. Try to avoid being around people who are sick. This medicine may increase your risk to bruise or bleed. Call your doctor or health care professional if you notice any unusual bleeding. Be careful brushing and flossing your teeth or using a toothpick because you may get an infection or bleed more easily. If you have any dental work done, tell your dentist you are receiving this medicine. Avoid taking products that contain aspirin, acetaminophen, ibuprofen, naproxen, or ketoprofen unless instructed by your doctor. These medicines may hide a fever. Do not become pregnant while taking this medicine. Women should inform their doctor if they wish to become pregnant or think they might be pregnant. There is a potential for serious side effects to an unborn child. Talk to your health care professional or pharmacist for more information. Do not breast-feed an infant while taking this medicine. What side effects may I notice from receiving this medicine? Side effects that you should report to your doctor or health care professional as soon as possible:  allergic reactions like skin rash, itching or hives, swelling of the face, lips, or tongue  signs of infection - fever or   chills, cough, sore throat, pain or difficulty passing urine  signs of decreased platelets or bleeding - bruising, pinpoint red spots on the skin, black, tarry stools, nosebleeds  signs of decreased red blood cells - unusually weak or tired, fainting spells, lightheadedness  breathing problems  changes in hearing  changes in vision  chest pain  high blood pressure  low blood counts - This drug may decrease the number of white blood cells, red blood cells and platelets. You may be at increased risk for infections and bleeding.  nausea and vomiting  pain, swelling, redness or irritation at the injection site  pain, tingling, numbness in the hands or  feet  problems with balance, talking, walking  trouble passing urine or change in the amount of urine Side effects that usually do not require medical attention (report to your doctor or health care professional if they continue or are bothersome):  hair loss  loss of appetite  metallic taste in the mouth or changes in taste This list may not describe all possible side effects. Call your doctor for medical advice about side effects. You may report side effects to FDA at 1-800-FDA-1088. Where should I keep my medicine? This drug is given in a hospital or clinic and will not be stored at home. NOTE: This sheet is a summary. It may not cover all possible information. If you have questions about this medicine, talk to your doctor, pharmacist, or health care provider.  2020 Elsevier/Gold Standard (2007-10-31 14:38:05)  

## 2020-03-03 ENCOUNTER — Telehealth: Payer: Self-pay

## 2020-03-03 ENCOUNTER — Inpatient Hospital Stay: Payer: Medicare Other

## 2020-03-03 ENCOUNTER — Inpatient Hospital Stay (HOSPITAL_BASED_OUTPATIENT_CLINIC_OR_DEPARTMENT_OTHER): Payer: Medicare Other | Admitting: Oncology

## 2020-03-03 ENCOUNTER — Other Ambulatory Visit: Payer: Self-pay

## 2020-03-03 DIAGNOSIS — C799 Secondary malignant neoplasm of unspecified site: Secondary | ICD-10-CM

## 2020-03-03 DIAGNOSIS — Z5111 Encounter for antineoplastic chemotherapy: Secondary | ICD-10-CM | POA: Diagnosis not present

## 2020-03-03 DIAGNOSIS — K668 Other specified disorders of peritoneum: Secondary | ICD-10-CM

## 2020-03-03 NOTE — Telephone Encounter (Signed)
Went to meet Linda Schmitt during chemo education class to provide appointment for [facility]. She was unaware of this and did not want to discuss it at this time. She went on to say that she has not heard from anyone regarding the type of cancer that she has. She is very upset with this lack of communication. Offered to meet with her after her chemo class and go over everything with her. At this time she is not interested and will see Dr. Tasia Catchings tomorrow to start treatment. I made her aware that Dr. Tasia Catchings will answer all of questions at that time. I will attend this appointment as well.

## 2020-03-03 NOTE — Progress Notes (Signed)
[place]  [phone] [phone]  Patient Care Team: Burnard Hawthorne, FNP as PCP - General (Family Medicine) Clent Jacks, RN as Oncology Nurse Navigator   Name of the patient: Linda Schmitt  [id number]  1956/03/08   Date of visit: 03/03/20  Diagnosis-ovarian cancer   Chief complaint/Reason for visit- Initial Meeting for [facility], preparing for starting chemotherapy  Heme/Onc history:  Oncology History   No history exists.    Interval history-[name] is a 64 year old female who presents to chemo care clinic today for initial meeting in preparation for starting chemotherapy. I introduced the chemo care clinic and we discussed that the role of the clinic is to assist those who are at an increased risk of emergency room visits and/or complications during the course of chemotherapy treatment. We discussed that the increased risk takes into account factors such as age, performance status, and co-morbidities. We also discussed that for some, this might include barriers to care such as not having a primary care provider, lack of insurance/transportation, or not being able to afford medications. We discussed that the goal of the program is to help prevent unplanned ER visits and help reduce complications during chemotherapy. We do this by discussing specific risk factors to each individual and identifying ways that we can help improve these risk factors and reduce barriers to care.   ECOG FS:0 - Asymptomatic  Review of systems- Review of Systems  Constitutional: Negative.  Negative for chills, fever, malaise/fatigue and weight loss.  HENT: Negative for congestion, ear pain and tinnitus.   Eyes: Negative.  Negative for blurred vision and double vision.  Respiratory: Negative.  Negative for cough, sputum production and shortness of breath.   Cardiovascular: Negative.  Negative for chest pain,  palpitations and leg swelling.  Gastrointestinal: Negative.  Negative for abdominal pain, constipation, diarrhea, nausea and vomiting.  Genitourinary: Negative for dysuria, frequency and urgency.  Musculoskeletal: Negative for back pain and falls.  Skin: Negative.  Negative for rash.  Neurological: Negative.  Negative for weakness and headaches.  Endo/Heme/Allergies: Negative.  Does not bruise/bleed easily.  Psychiatric/Behavioral: Negative.  Negative for depression. The patient is not nervous/anxious and does not have insomnia.      Current treatment-carbo/Taxol  No Known Allergies  Past Medical History:  Diagnosis Date  . HLD (hyperlipidemia)     Past Surgical History:  Procedure Laterality Date  . CARPAL TUNNEL RELEASE Left   . SHOULDER ARTHROSCOPY WITH SUBACROMIAL DECOMPRESSION AND OPEN ROTATOR C Right 02/01/2020   Procedure: RIGHT SHOULDER ARTHROSCOPIC SUBSCAPULARIS REPAIR, ROTATOR CUFF REPAIR, SUABCROMIAL DECOMPRESSION, AND BICEPS TENODESIS.;  Surgeon: Leim Fabry, MD;  Location: [facility] ORS;  Service: Orthopedics;  Laterality: Right;  . TUBAL LIGATION      Social History   Socioeconomic History  . Marital status: Single    Spouse name: Not on file  . Number of children: Not on file  . Years of education: Not on file  . Highest education level: Not on file  Occupational History  . Not on file  Tobacco Use  . Smoking status: Former Smoker    Packs/day: 0.75    Years: 40.00    Pack years: 30.00    Types: Cigarettes    Quit date: 02/06/2018    Years since quitting: 2.0  . Smokeless tobacco: Never Used  . Tobacco comment: quit 02/2018  Vaping Use  . Vaping Use: Never used  Substance and Sexual Activity  .  Alcohol use: Yes    Comment: 2-3 glasses of wine per week  . Drug use: No  . Sexual activity: Not on file  Other Topics Concern  . Not on file  Social History Narrative   Lives in [place] alone. Work - [employer]   Diet: Regular   Exercise: walking   Social  Determinants of Radio broadcast assistant Strain:   . Difficulty of Paying Living Expenses:   Food Insecurity: No Landscape architect  . Worried About Charity fundraiser in the Last Year: Never true  . Ran Out of Food in the Last Year: Never true  Transportation Needs: No Transportation Needs  . Lack of Transportation (Medical): No  . Lack of Transportation (Non-Medical): No  Physical Activity:   . Days of Exercise per Week:   . Minutes of Exercise per Session:   Stress: No Stress Concern Present  . Feeling of Stress : Not at all  Social Connections:   . Frequency of Communication with Friends and Family:   . Frequency of Social Gatherings with Friends and Family:   . Attends Religious Services:   . Active Member of Clubs or Organizations:   . Attends Archivist Meetings:   Marland Kitchen Marital Status:   Intimate Partner Violence: Not At Risk  . Fear of Current or Ex-Partner: No  . Emotionally Abused: No  . Physically Abused: No  . Sexually Abused: No    Family History  Problem Relation Age of Onset  . Cancer Mother        lymphoma  . Stroke Sister   . Cancer Brother        lung  . Cancer Sister        lung?  . Breast cancer Cousin 45       maternal     Current Outpatient Medications:  .  acetaminophen (TYLENOL) 500 MG tablet, Take 2 tablets (1,000 mg total) by mouth every 8 (eight) hours., Disp: 90 tablet, Rfl: 2 .  cholecalciferol (QC VITAMIN D3) 10 MCG (400 UNIT) TABS tablet, Take 800 Units by mouth daily., Disp: , Rfl:  .  gabapentin (NEURONTIN) 100 MG capsule, Take 1 capsule (100 mg total) by mouth 3 (three) times daily. (Patient taking differently: Take 100 mg by mouth at bedtime. Taking 1 QD), Disp: 90 capsule, Rfl: 3 .  ondansetron (ZOFRAN ODT) 4 MG disintegrating tablet, Take 1 tablet (4 mg total) by mouth every 8 (eight) hours as needed for nausea or vomiting. (Patient not taking: Reported on 02/26/2020), Disp: 20 tablet, Rfl: 0 .  oxyCODONE (ROXICODONE) 5 MG  immediate release tablet, Take 1-2 tablets (5-10 mg total) by mouth every 4 (four) hours as needed (pain). (Patient not taking: Reported on 02/26/2020), Disp: 30 tablet, Rfl: 0 .  pravastatin (PRAVACHOL) 40 MG tablet, TAKE 1 TABLET(40 MG) BY MOUTH DAILY (Patient taking differently: Take 40 mg by mouth at bedtime. ), Disp: 90 tablet, Rfl: 0  Physical exam: There were no vitals filed for this visit. Physical Exam Constitutional:      Appearance: Normal appearance.  HENT:     Head: Normocephalic and atraumatic.  Eyes:     Pupils: Pupils are equal, round, and reactive to light.  Cardiovascular:     Rate and Rhythm: Normal rate and regular rhythm.     Heart sounds: Normal heart sounds. No murmur heard.   Pulmonary:     Effort: Pulmonary effort is normal.     Breath sounds: Normal breath sounds.  No wheezing.  Abdominal:     General: Bowel sounds are normal. There is no distension.     Palpations: Abdomen is soft.     Tenderness: There is no abdominal tenderness.  Musculoskeletal:        General: Normal range of motion.     Cervical back: Normal range of motion.  Skin:    General: Skin is warm and dry.     Findings: No rash.  Neurological:     Mental Status: She is alert and oriented to person, place, and time.  Psychiatric:        Judgment: Judgment normal.      CMP Latest Ref Rng & Units 02/19/2020  Glucose 70 - 99 mg/dL 124(H)  BUN 8 - 23 mg/dL 9  Creatinine 0.44 - 1.00 mg/dL 0.83  Sodium 135 - 145 mmol/L 138  Potassium 3.5 - 5.1 mmol/L 3.9  Chloride 98 - 111 mmol/L 102  CO2 22 - 32 mmol/L 26  Calcium 8.9 - 10.3 mg/dL 8.8(L)  Total Protein 6.5 - 8.1 g/dL 6.5  Total Bilirubin 0.3 - 1.2 mg/dL 0.9  Alkaline Phos 38 - 126 U/L 40  AST 15 - 41 U/L 16  ALT 0 - 44 U/L 17   CBC Latest Ref Rng & Units 02/19/2020  WBC 4.0 - 10.5 K/uL 8.7  Hemoglobin 12.0 - 15.0 g/dL 12.0  Hematocrit 36 - 46 % 37.8  Platelets 150 - 400 K/uL 262    No images are attached to the encounter.  CT  Chest Wo Contrast  Result Date: 02/17/2020 CLINICAL DATA:  64 year old female with pleural effusion. EXAM: CT CHEST WITHOUT CONTRAST TECHNIQUE: Multidetector CT imaging of the chest was performed following the standard protocol without IV contrast. COMPARISON:  Chest radiograph dated 02/17/2020 and CT dated 10/11/2019. FINDINGS: Evaluation of this exam is limited in the absence of intravenous contrast. Cardiovascular: There is no cardiomegaly or pericardial effusion. Minimal atherosclerotic calcification of the aortic arch. The aorta and central pulmonary arteries are otherwise grossly unremarkable on this noncontrast CT. Mediastinum/Nodes: There is slight shift of the mediastinum to the right of the midline secondary to mass effect caused by pleural effusion. No definite hilar or mediastinal adenopathy. Evaluation however is very limited in the absence of contrast as well as due to consolidative changes of the left lung. The esophagus and the thyroid gland are grossly unremarkable. No mediastinal fluid collection. Lungs/Pleura: There is a large left pleural effusion with complete consolidative changes of the left lower lobe and majority of the left upper lobe which may represent compressive atelectasis or infiltrate. Underlying mass is not excluded. Clinical correlation and follow-up recommended. Thoracentesis may provide diagnostic information and symptomatic relief. These findings are new since the study of 10/11/2019. The right lung is clear. There is no pneumothorax. The central airways are patent. Upper Abdomen: Indeterminate 15 mm left adrenal nodule. Musculoskeletal: No chest wall mass or suspicious bone lesions identified. IMPRESSION: Large left pleural effusion with consolidative changes of the majority of the left lung, new since the prior CT of 10/11/2019. Clinical correlation and follow-up to resolution recommended. Thoracentesis may provide additional diagnostic information and symptomatic relief.  Electronically Signed   By: Anner Crete M.D.   On: 02/17/2020 19:14   MR Brain W Wo Contrast  Result Date: 02/27/2020 CLINICAL DATA:  Metastatic carcinoma. Malignant pleural effusion. Intraperitoneal tumor. Staging for intracranial disease. EXAM: MRI HEAD WITHOUT AND WITH CONTRAST TECHNIQUE: Multiplanar, multiecho pulse sequences of the brain and surrounding structures were  obtained without and with intravenous contrast. CONTRAST:  7mL GADAVIST GADOBUTROL 1 MMOL/ML IV SOLN COMPARISON:  None. FINDINGS: Brain: Diffusion imaging does not show any acute or subacute infarction. Brainstem and cerebellum are normal. There is a single old white matter infarction in the right posterior frontal white matter. No cortical or large vessel territory infarction. After contrast administration, no abnormal enhancement occurs. No evidence of primary or metastatic mass lesion. No leptomeningeal disease. Vascular: Major vessels at the base of the brain show flow. Skull and upper cervical spine: Negative Sinuses/Orbits: Clear/normal Other: None IMPRESSION: No evidence of regional metastatic disease. Normal study with exception of an old white matter infarction in the right posterior frontal region. Electronically Signed   By: Nelson Chimes M.D.   On: 02/27/2020 19:00   NM PET Image Initial (PI) Skull Base To Thigh  Result Date: 02/26/2020 CLINICAL DATA:  Initial treatment strategy for malignant pleural effusion. EXAM: NUCLEAR MEDICINE PET SKULL BASE TO THIGH TECHNIQUE: 12.83 mCi F-18 FDG was injected intravenously. Full-ring PET imaging was performed from the skull base to thigh after the radiotracer. CT data was obtained and used for attenuation correction and anatomic localization. Fasting blood glucose: 91 mg/dl COMPARISON:  None. FINDINGS: Mediastinal blood pool activity: SUV max 3.16 Liver activity: SUV max NA NECK: There is symmetric radiotracer uptake noted within the base of tongue and bilateral tonsillar regions  with SUV max of 7.49. No FDG avid lymph nodes identified within the soft tissues of the neck. Incidental CT findings: none CHEST: No FDG avid axillary or supraclavicular lymph nodes. No FDG avid mediastinal or hilar lymph nodes. Known malignant, loculated left pleural effusion is noted with overlying compressive type atelectasis. This has decreased in volume when compared with pre thoracentesis chest CT dated 02/17/2020. There is a thin rind of mildly FDG avid soft tissue overlying the left lung compatible with malignant pleural effusion. The corresponding SUV max ranges between 3.25 and 2.83. There is a trace amount of right pleural fluid which also exhibits mild FDG uptake within SUV max of 3.15, image 93/3. No suspicious or FDG avid pulmonary nodule or mass identified. Incidental CT findings: Mild aortic atherosclerosis. ABDOMEN/PELVIS: There is no abnormal FDG uptake within the liver, pancreas, spleen, or adrenal glands. Left adrenal nodule measures 1.4 cm and does not exhibit any significant tracer uptake favoring a benign adenoma. No enlarged or FDG avid retroperitoneal or pelvic lymph nodes. FDG avid soft tissue infiltration of the omentum is identified compatible with peritoneal carcinomatosis. Focal area of increased omental soft tissue along the midline of the abdomen measures 4.7 x 2.2 cm and has an SUV max of 7.27. Additional small peritoneal nodules are noted scattered within the upper abdomen including on image 169/3 and image 153/3. No ascites identified. Within the right pelvis abutting the right side of the uterus is a fluid density structure which measures 4.3 x 2.7 cm within SUV max of 4.20. Incidental CT findings: Aortic atherosclerosis. SKELETON: No focal hypermetabolic activity to suggest skeletal metastasis. Incidental CT findings: none IMPRESSION: 1. Exam shows evidence of peritoneal disease within the abdomen including FDG avid soft tissue infiltration into the omentum. 2. Large, loculated  left pleural effusion with thin, FDG avid rind of soft tissue overlying the left lung. This corresponds to known, pathology proven malignant pleural effusion. 3. Small right pleural effusion with mild FDG uptake is equivocal for malignant effusion of the right hemithorax. 4. Fluid density structure within right side of pelvis abutting the right-side of the uterus  is noted. This is indeterminate. Further investigation with pelvic sonogram may be helpful. Electronically Signed   By: Kerby Moors M.D.   On: 02/26/2020 15:36   DG Chest Port 1 View  Result Date: 02/19/2020 CLINICAL DATA:  Status post left thoracentesis EXAM: PORTABLE CHEST 1 VIEW COMPARISON:  02/18/2020 FINDINGS: Large left pleural effusion has been partially evacuated. Small residual left pleural effusion persists. Mild left basilar atelectasis results in mild left-sided volume loss. No pneumothorax. Right lung is clear. Cardiac size within normal limits. No acute bone abnormality. IMPRESSION: No pneumothorax following left thoracentesis. Small residual left pleural effusion. Electronically Signed   By: Fidela Salisbury MD   On: 02/19/2020 17:03   DG Chest Port 1 View  Result Date: 02/18/2020 CLINICAL DATA:  Status post left thoracentesis EXAM: PORTABLE CHEST 1 VIEW COMPARISON:  02/17/2020 FINDINGS: Large left pleural effusion remains, slightly decreased since prior study. No pneumothorax following thoracentesis. Left lower lobe atelectasis. No focal opacity on the right. Heart is normal size. IMPRESSION: Large left pleural effusion remains following thoracentesis. No pneumothorax. Electronically Signed   By: Rolm Baptise M.D.   On: 02/18/2020 12:18   DG Chest Port 1 View  Result Date: 02/17/2020 CLINICAL DATA:  Chest pain and shortness of breath EXAM: PORTABLE CHEST 1 VIEW COMPARISON:  05/23/2016, 10/11/2019, 01/09/2020 FINDINGS: Single frontal view of the chest demonstrates interval opacification of the lower 2/3 of the left hemithorax,  consistent with effusion and consolidation. Right chest is clear. No pneumothorax. No acute bony abnormalities. Chronic Hill-Sachs deformity and degenerative changes of the right shoulder. IMPRESSION: 1. Interval development of significant left lung consolidation and/or effusion. Electronically Signed   By: Randa Ngo M.D.   On: 02/17/2020 18:03   ECHOCARDIOGRAM COMPLETE  Result Date: 02/19/2020    ECHOCARDIOGRAM REPORT   Patient Name:   KIMMORA RISENHOOVER Date of Exam: 02/18/2020 Medical Rec #:  [id number]         Height:       63.0 in Accession #:    [id number]        Weight:       229.3 lb Date of Birth:  [birth year]         BSA:          2.049 m Patient Age:    1 years          BP:           114/69 mmHg Patient Gender: F                 HR:           87 bpm. Exam Location:  [facility] Procedure: 2D Echo, Cardiac Doppler and Color Doppler Indications:     Chest Pain 786.50  History:         Patient has no prior history of Echocardiogram examinations.                  HLD.  Sonographer:     Alyse Low Roar Referring Phys:  [id number] Bonnielee Haff Diagnosing Phys: Nelva Bush MD IMPRESSIONS  1. Left ventricular ejection fraction, by estimation, is 65 to 70%. The left ventricle has normal function. Left ventricular endocardial border not optimally defined to evaluate regional wall motion. There is mild left ventricular hypertrophy. Left ventricular diastolic parameters are consistent with Grade I diastolic dysfunction (impaired relaxation).  2. Right ventricular systolic function is normal. The right ventricular size is normal. Tricuspid regurgitation signal is inadequate for assessing PA pressure.  3. The mitral valve was not well visualized. No evidence of mitral valve regurgitation. No evidence of mitral stenosis.  4. The aortic valve was not well visualized. Aortic valve regurgitation is not visualized. No aortic stenosis is present. FINDINGS  Left Ventricle: Left ventricular ejection fraction, by estimation, is 65  to 70%. The left ventricle has normal function. Left ventricular endocardial border not optimally defined to evaluate regional wall motion. The left ventricular internal cavity size was normal in size. There is mild left ventricular hypertrophy. Left ventricular diastolic parameters are consistent with Grade I diastolic dysfunction (impaired relaxation). Right Ventricle: The right ventricular size is normal. No increase in right ventricular wall thickness. Right ventricular systolic function is normal. Tricuspid regurgitation signal is inadequate for assessing PA pressure. Left Atrium: Left atrial size was normal in size. Right Atrium: Right atrial size was normal in size. Pericardium: The pericardium was not well visualized. Mitral Valve: The mitral valve was not well visualized. No evidence of mitral valve regurgitation. No evidence of mitral valve stenosis. Tricuspid Valve: The tricuspid valve is not well visualized. Tricuspid valve regurgitation is trivial. Aortic Valve: The aortic valve was not well visualized. Aortic valve regurgitation is not visualized. No aortic stenosis is present. Aortic valve mean gradient measures 6.0 mmHg. Aortic valve peak gradient measures 13.5 mmHg. Aortic valve area, by VTI measures 2.16 cm. Pulmonic Valve: The pulmonic valve was not well visualized. Pulmonic valve regurgitation is not visualized. No evidence of pulmonic stenosis. Aorta: The aortic root is normal in size and structure. Pulmonary Artery: The pulmonary artery is not well seen. Venous: The inferior vena cava was not well visualized. IAS/Shunts: The interatrial septum was not well visualized.  LEFT VENTRICLE PLAX 2D LVIDd:         4.25 cm  Diastology LVIDs:         2.95 cm  LV e' lateral:   7.29 cm/s LV PW:         1.09 cm  LV E/e' lateral: 10.0 LV IVS:        1.09 cm  LV e' medial:    7.18 cm/s LVOT diam:     1.70 cm  LV E/e' medial:  10.2 LV SV:         52 LV SV Index:   25 LVOT Area:     2.27 cm  RIGHT VENTRICLE  RV S prime:     14.10 cm/s LEFT ATRIUM             Index       RIGHT ATRIUM          Index LA diam:        3.60 cm 1.76 cm/m  RA Area:     8.68 cm LA Vol (A2C):   36.9 ml 18.01 ml/m RA Volume:   14.90 ml 7.27 ml/m LA Vol (A4C):   27.1 ml 13.22 ml/m LA Biplane Vol: 31.7 ml 15.47 ml/m  AORTIC VALVE                    PULMONIC VALVE AV Area (Vmax):    2.06 cm     PV Vmax:        0.92 m/s AV Area (Vmean):   2.07 cm     PV Peak grad:   3.4 mmHg AV Area (VTI):     2.16 cm     RVOT Peak grad: 4 mmHg AV Vmax:           184.00 cm/s  AV Vmean:          104.000 cm/s AV VTI:            0.238 m AV Peak Grad:      13.5 mmHg AV Mean Grad:      6.0 mmHg LVOT Vmax:         167.00 cm/s LVOT Vmean:        94.900 cm/s LVOT VTI:          0.227 m LVOT/AV VTI ratio: 0.95  AORTA Ao Root diam: 2.40 cm MITRAL VALVE MV Area (PHT): 5.13 cm     SHUNTS MV Decel Time: 148 msec     Systemic VTI:  0.23 m MV E velocity: 73.10 cm/s   Systemic Diam: 1.70 cm MV A velocity: 109.00 cm/s MV E/A ratio:  0.67 MV A Prime:    16.4 cm/s Nelva Bush MD Electronically signed by Nelva Bush MD Signature Date/Time: 02/19/2020/1:00:30 PM    Final    US PELVIC COMPLETE WITH TRANSVAGINAL  Result Date: 02/26/2020 CLINICAL DATA:  Malignant pleural effusion, abnormal PET-CT with a cystic structure in the RIGHT adnexa; postmenopausal EXAM: TRANSABDOMINAL AND TRANSVAGINAL ULTRASOUND OF PELVIS TECHNIQUE: Both transabdominal and transvaginal ultrasound examinations of the pelvis were performed. Transabdominal technique was performed for global imaging of the pelvis including uterus, ovaries, adnexal regions, and pelvic cul-de-sac. It was necessary to proceed with endovaginal exam following the transabdominal exam to visualize the uterus, endometrium, and ovaries. COMPARISON:  PET-CT 01/26/2020 FINDINGS: Uterus Measurements: 6.2 x 2.7 x 3.5 cm = volume: 30 mL. Anteverted. Heterogeneous myometrium. No focal mass Endometrium Thickness: 4 mm. Poorly defined.  No definite mass or endometrial fluid Right ovary Questionable visualization of RIGHT ovary see below Left ovary Measurements: 1.9 x 0.9 x 1.1 cm = volume: 0.4 mL. Normal morphology without mass Other findings Trace free pelvic fluid. Question cystic lesions seen on prior PET-CT is not definitely visualized. A small focus of soft tissue is identified in the RIGHT adnexa 2.0 x 1.1 x 1.0 cm, suspect representing a small RIGHT ovary. IMPRESSION: Heterogeneous myometrium with poor definition of endometrial complex. Normal LEFT ovary. No cystic RIGHT adnexal lesion is identified, though a probable small normal appearing RIGHT ovary is identified. Suspect that the low-attenuation presume cystic structure seen in the RIGHT adnexa on the prior CT corresponds to a small amount of nonspecific free fluid in the RIGHT adnexa. Electronically Signed   By: Lavonia Dana M.D.   On: 02/26/2020 17:43   US THORACENTESIS ASP PLEURAL SPACE W/IMG GUIDE  Result Date: 02/19/2020 INDICATION: Prior smoker with history of dyspnea and recurrent malignant left pleural effusion. Request made for therapeutic left thoracentesis. EXAM: ULTRASOUND GUIDED THERAPEUTIC LEFT THORACENTESIS MEDICATIONS: 1% lidocaine to skin and subcutaneous tissue COMPLICATIONS: None immediate. PROCEDURE: An ultrasound guided thoracentesis was thoroughly discussed with the patient and questions answered. The benefits, risks, alternatives and complications were also discussed. The patient understands and wishes to proceed with the procedure. Written consent was obtained. Ultrasound was performed to localize and mark an adequate pocket of fluid in the left chest. The area was then prepped and draped in the normal sterile fashion. 1% Lidocaine was used for local anesthesia. Under ultrasound guidance a 6 Fr Safe-T-Centesis catheter was introduced. Thoracentesis was performed. The catheter was removed and a dressing applied. FINDINGS: A total of approximately 1.2 liters of  bloody fluid was removed. Due to patient coughing and chest discomfort only the above amount of fluid was removed today. IMPRESSION: Successful  ultrasound guided therapeutic left thoracentesis yielding 1.2 liters of pleural fluid. Read by: Rowe Robert, PA-C Electronically Signed   By: Aletta Edouard M.D.   On: 02/19/2020 16:38   US THORACENTESIS ASP PLEURAL SPACE W/IMG GUIDE  Result Date: 02/18/2020 INDICATION: Patient with history of hyperlipidemia and elective right shoulder surgery performed on February 12, 2020. Patient presents to this facility with shortness of breath and chest pain found to have a pleural effusion. Request is for therapeutic and diagnostic left-sided thoracentesis EXAM: ULTRASOUND GUIDED LEFT THERAPEUTIC AND DIAGNOSTIC THORACENTESIS MEDICATIONS: Lidocaine 1% 10 mL. COMPLICATIONS: None immediate. Patient unable to tolerate additional fluid removal at this time. PROCEDURE: An ultrasound guided thoracentesis was thoroughly discussed with the patient and questions answered. The benefits, risks, alternatives and complications were also discussed. The patient understands and wishes to proceed with the procedure. Written consent was obtained. Ultrasound was performed to localize and mark an adequate pocket of fluid in the left chest. The area was then prepped and draped in the normal sterile fashion. 1% Lidocaine was used for local anesthesia. Under ultrasound guidance a 6 Fr Safe-T-Centesis catheter was introduced. Thoracentesis was performed. The catheter was removed and a dressing applied. FINDINGS: A total of approximately 1.8 L of serosanguineous fluid was removed. Samples were sent to the laboratory as requested by the clinical team. IMPRESSION: Successful ultrasound guided left-sided therapeutic and diagnostic thoracentesis yielding 1.8 L of pleural fluid. Read by: Rushie Nyhan, NP Electronically Signed   By: Markus Daft M.D.   On: 02/18/2020 12:38     Assessment and plan- Patient  is a 64 y.o. female who presents to [facility] for initial meeting in preparation for starting chemotherapy for the treatment of ovarian cancer.   1. HPI: Mrs. Borger is a 64 year old female who presented to the emergency room for shortness of breath and chest pain.  Imaging revealed a left-sided pleural effusion.  Had to ultrasound-guided thoracentesis on 02/18/2020 and 02/19/2020.  Cytology of fluid revealed PAX 8 positivity which is tumor marker for gynecological and renal origin.  Pelvic ultrasound was not diagnostically helpful.  Patient had CT-guided omental biopsy for further tissue diagnosis along with additional tumor markers.  Plan is for chemotherapy with carbo/Taxol every 3 weeks for 3 cycles followed by possible surgery.  She will establish care with gynecologic oncology this week.   2. Chemo Care Clinic/High Risk for ER/Hospitalization during chemotherapy- We discussed the role of the chemo care clinic and identified patient specific risk factors. I discussed that patient was identified as high risk primarily based on: Stage of disease  Patient has past medical history positive for: Past Medical History:  Diagnosis Date  . HLD (hyperlipidemia)     Patient has past surgical history positive for: Past Surgical History:  Procedure Laterality Date  . CARPAL TUNNEL RELEASE Left   . SHOULDER ARTHROSCOPY WITH SUBACROMIAL DECOMPRESSION AND OPEN ROTATOR C Right 02/01/2020   Procedure: RIGHT SHOULDER ARTHROSCOPIC SUBSCAPULARIS REPAIR, ROTATOR CUFF REPAIR, SUABCROMIAL DECOMPRESSION, AND BICEPS TENODESIS.;  Surgeon: Leim Fabry, MD;  Location: [facility] ORS;  Service: Orthopedics;  Laterality: Right;  . TUBAL LIGATION      Based on our high risk symptom management report; this patient has a high risk of ED utilization.  The percentage below indicates how "at risk "  this patient based on the factors in this table within one year.   General Risk Score: 6  Values used to calculate this  score:   Points  Metrics  1        Age: 30      [facility] Admissions: 3      1        ED Visits: 1      0        Has Chronic Obstructive Pulmonary Disease: No      0        Has Diabetes: No      0        Has Congestive Heart Failure: No      0        Has liver disease: No      1        Has Depression: Yes      0        Current PCP: Burnard Hawthorne, FNP      0        Has Medicaid: No   3. We discussed that social determinants of health may have significant impacts on health and outcomes for cancer patients.  Today we discussed specific social determinants of performance status, alcohol use, depression, financial needs, food insecurity, housing, interpersonal violence, social connections, stress, tobacco use, and transportation.    After lengthy discussion the following were identified as areas of need: None at this time  Outpatient services: We discussed options including home based and outpatient services, DME and care program. We discusssed that patients who participate in regular physical activity report fewer negative impacts of cancer and treatments and report less fatigue.   Financial Concerns: We discussed that living with cancer can create tremendous financial burden.  We discussed options for assistance. I asked that if assistance is needed in affording medications or paying bills to please let us know so that we can provide assistance. We discussed options for food including social services, [employer] ($50 every 2 weeks) and onsite food pantry.  We will also notify [facility] to see if cancer center can provide additional support.  Referral to Social work: Introduced Education officer, museum Elease Etienne and the services he can provide such as support with [employer], cell phone and gas vouchers.   Support groups: We discussed options for support groups at the cancer center. If interested, please notify nurse navigator to enroll. We discussed options for managing  stress including healthy eating, exercise as well as participating in no charge counseling services at the cancer center and support groups.  If these are of interest, patient can notify either myself or primary nursing team.We discussed options for management including medications and referral to quit Smart program  Transportation: We discussed options for transportation including acta, paratransit, bus routes, link transit, taxi/uber/lyft, and [facility].  I have notified primary oncology team who will help assist with arranging Lucianne Lei transportation for appointments when/if needed. We also discussed options for transportation on short notice/acute visits.  Palliative care services: We have palliative care services available in the cancer center to discuss goals of care and advanced care planning.  Please let us know if you have any questions or would like to speak to our palliative nurse practitioner.  Symptom Management Clinic: We discussed our symptom management clinic which is available for acute concerns while receiving treatment such as nausea, vomiting or diarrhea.  We can be reached via telephone at [phone] or through my chart.  We are available for virtual or in person visits on the same day from 830 to 4 PM Monday through  Friday. She denies needing specific assistance at this time and She will be followed by Dr.Yu clinical team.  Plan: Discussed symptom management clinic. Discussed palliative care services. Discussed resources that are available here at the cancer center. Discussed medications and new prescriptions to begin treatment such as anti-nausea or steroids.   Disposition: RTC on 03/04/2020 for lab work, MD assessment and cycle 1 of carbotaxol.  Visit Diagnosis 1. Metastatic carcinoma [facility])     Patient expressed understanding and was in agreement with this plan. She also understands that She can call clinic at any time with any questions, concerns, or complaints.    Greater than 50% was spent in counseling and coordination of care with this patient including but not limited to discussion of the relevant topics above (See A&P) including, but not limited to diagnosis and management of acute and chronic medical conditions.   [place] [facility] [place]  CC: Dr. Tasia Catchings

## 2020-03-04 ENCOUNTER — Inpatient Hospital Stay: Payer: Medicare Other

## 2020-03-04 ENCOUNTER — Encounter: Payer: Self-pay | Admitting: Oncology

## 2020-03-04 ENCOUNTER — Inpatient Hospital Stay: Payer: Medicare Other | Admitting: Oncology

## 2020-03-04 ENCOUNTER — Other Ambulatory Visit: Payer: Self-pay | Admitting: Physician Assistant

## 2020-03-04 ENCOUNTER — Inpatient Hospital Stay (HOSPITAL_BASED_OUTPATIENT_CLINIC_OR_DEPARTMENT_OTHER): Payer: Medicare Other | Admitting: Oncology

## 2020-03-04 VITALS — BP 135/83 | HR 94 | Temp 97.3°F | Wt 228.6 lb

## 2020-03-04 VITALS — BP 152/93 | HR 99 | Resp 18

## 2020-03-04 DIAGNOSIS — Z7189 Other specified counseling: Secondary | ICD-10-CM | POA: Diagnosis not present

## 2020-03-04 DIAGNOSIS — Z5111 Encounter for antineoplastic chemotherapy: Secondary | ICD-10-CM | POA: Diagnosis not present

## 2020-03-04 DIAGNOSIS — J910 Malignant pleural effusion: Secondary | ICD-10-CM

## 2020-03-04 DIAGNOSIS — K668 Other specified disorders of peritoneum: Secondary | ICD-10-CM | POA: Diagnosis not present

## 2020-03-04 DIAGNOSIS — C799 Secondary malignant neoplasm of unspecified site: Secondary | ICD-10-CM

## 2020-03-04 DIAGNOSIS — G629 Polyneuropathy, unspecified: Secondary | ICD-10-CM

## 2020-03-04 DIAGNOSIS — G620 Drug-induced polyneuropathy: Secondary | ICD-10-CM | POA: Insufficient documentation

## 2020-03-04 LAB — COMPREHENSIVE METABOLIC PANEL
ALT: 18 U/L (ref 0–44)
AST: 18 U/L (ref 15–41)
Albumin: 3.8 g/dL (ref 3.5–5.0)
Alkaline Phosphatase: 43 U/L (ref 38–126)
Anion gap: 10 (ref 5–15)
BUN: 10 mg/dL (ref 8–23)
CO2: 25 mmol/L (ref 22–32)
Calcium: 9 mg/dL (ref 8.9–10.3)
Chloride: 102 mmol/L (ref 98–111)
Creatinine, Ser: 0.88 mg/dL (ref 0.44–1.00)
GFR calc Af Amer: >60 mL/min (ref >60)
GFR calc non Af Amer: >60 mL/min (ref >60)
Glucose, Bld: 142 mg/dL — ABNORMAL HIGH (ref 70–99)
Potassium: 3.7 mmol/L (ref 3.5–5.1)
Sodium: 137 mmol/L (ref 135–145)
Total Bilirubin: 0.7 mg/dL (ref 0.3–1.2)
Total Protein: 7.1 g/dL (ref 6.5–8.1)

## 2020-03-04 LAB — CBC WITH DIFFERENTIAL/PLATELET
Abs Immature Granulocytes: 0.02 10*3/uL (ref 0.00–0.07)
Basophils Absolute: 0 10*3/uL (ref 0.0–0.1)
Basophils Relative: 1 %
Eosinophils Absolute: 0.2 10*3/uL (ref 0.0–0.5)
Eosinophils Relative: 2 %
HCT: 35.9 % — ABNORMAL LOW (ref 36.0–46.0)
Hemoglobin: 11.7 g/dL — ABNORMAL LOW (ref 12.0–15.0)
Immature Granulocytes: 0 %
Lymphocytes Relative: 21 %
Lymphs Abs: 1.7 10*3/uL (ref 0.7–4.0)
MCH: 28.7 pg (ref 26.0–34.0)
MCHC: 32.6 g/dL (ref 30.0–36.0)
MCV: 88.2 fL (ref 80.0–100.0)
Monocytes Absolute: 0.8 10*3/uL (ref 0.1–1.0)
Monocytes Relative: 10 %
Neutro Abs: 5.3 10*3/uL (ref 1.7–7.7)
Neutrophils Relative %: 66 %
Platelets: 270 10*3/uL (ref 150–400)
RBC: 4.07 MIL/uL (ref 3.87–5.11)
RDW: 13.3 % (ref 11.5–15.5)
WBC: 8.1 10*3/uL (ref 4.0–10.5)
nRBC: 0 % (ref 0.0–0.2)

## 2020-03-04 MED ORDER — ONDANSETRON HCL 8 MG PO TABS: 8.0000 mg | ORAL_TABLET | Freq: Two times a day (BID) | ORAL | 1 refills | Status: DC | PRN

## 2020-03-04 MED ORDER — FAMOTIDINE IN NACL 20-0.9 MG/50ML-% IV SOLN
20.0000 mg | Freq: Once | INTRAVENOUS | Status: AC
  Administered 2020-03-04: 20 mg via INTRAVENOUS
  Filled 2020-03-04: qty 50

## 2020-03-04 MED ORDER — PALONOSETRON HCL INJECTION 0.25 MG/5ML
0.2500 mg | Freq: Once | INTRAVENOUS | Status: AC
  Administered 2020-03-04: 0.25 mg via INTRAVENOUS
  Filled 2020-03-04: qty 5

## 2020-03-04 MED ORDER — SODIUM CHLORIDE 0.9 % IV SOLN
175.0000 mg/m2 | Freq: Once | INTRAVENOUS | Status: AC
  Administered 2020-03-04: 390 mg via INTRAVENOUS
  Filled 2020-03-04: qty 65

## 2020-03-04 MED ORDER — DIPHENHYDRAMINE HCL 50 MG/ML IJ SOLN
50.0000 mg | Freq: Once | INTRAMUSCULAR | Status: AC
  Administered 2020-03-04: 50 mg via INTRAVENOUS
  Filled 2020-03-04: qty 1

## 2020-03-04 MED ORDER — LIDOCAINE-PRILOCAINE 2.5-2.5 % EX CREA: TOPICAL_CREAM | CUTANEOUS | 3 refills | Status: DC

## 2020-03-04 MED ORDER — SODIUM CHLORIDE 0.9 % IV SOLN
10.0000 mg | Freq: Once | INTRAVENOUS | Status: AC
  Administered 2020-03-04: 10 mg via INTRAVENOUS
  Filled 2020-03-04: qty 10

## 2020-03-04 MED ORDER — SODIUM CHLORIDE 0.9 % IV SOLN
150.0000 mg | Freq: Once | INTRAVENOUS | Status: AC
  Administered 2020-03-04: 150 mg via INTRAVENOUS
  Filled 2020-03-04: qty 150

## 2020-03-04 MED ORDER — PROCHLORPERAZINE MALEATE 10 MG PO TABS: 10.0000 mg | ORAL_TABLET | Freq: Four times a day (QID) | ORAL | 1 refills | Status: DC | PRN

## 2020-03-04 MED ORDER — SODIUM CHLORIDE 0.9 % IV SOLN
588.5000 mg | Freq: Once | INTRAVENOUS | Status: AC
  Administered 2020-03-04: 590 mg via INTRAVENOUS
  Filled 2020-03-04: qty 59

## 2020-03-04 MED ORDER — SODIUM CHLORIDE 0.9 % IV SOLN
Freq: Once | INTRAVENOUS | Status: AC
  Filled 2020-03-04: qty 250

## 2020-03-04 NOTE — Progress Notes (Signed)
Hematology/Oncology Follow Up Note [facility]  Telephone:([phone] [phone]  Patient Care Team: Burnard Hawthorne, FNP as PCP - General (Family Medicine) Clent Jacks, RN as Oncology Nurse Navigator   Name of the patient: Linda Schmitt  [id number]  [birth year]   REASON FOR VISIT  follow-up for malignant pleural effusion  PERTINENT ONCOLOGY HISTORY DONNIKA KUCHER is a 65 y.o.afemale who has above oncology history reviewed by me today presented for follow up visit for management of malignant pleural effusion status post thoracentesis x2 during her recent admission. Cytology was positive for metastatic carcinoma.  TTF-1 Napsin A, GATA3, CDX2 were negative. Positive for PAX 8, which is most often expressing tumors of gynecological and renal origin. PET scan was independently reviewed by me and discussed with patient.  I also discussed with radiology. Exam showed evidence of peritoneal disease within the abdomen, soft tissue infiltrating into the omentum.  Large loculated left pleural effusion with thin FDG avid rind of soft tissue overlying the left lung.  Small right pleural effusion with mild FDG uptake is equivocal for malignant effusion of the right hemithorax. Fluid density structure within the right side of the pelvis abutting the right-sided of uterus is noted.  This is indeterminate.  Further investigation with pelvic sonogram may be helpful.  Pelvic ultrasound was obtained.-Heterogeneous myometrium with poor definition of endometrial complex. Normal left ovary.  No cystic right adnexal lesion is identified.  No a probable small normal-appearing right ovary is identified.  Suspect that the low-attenuation presumed cystic structure seen in the right adnexa on the prior image corresponds to a small amount of nonspecific free fluid in the right adnexa. I discussed with gynecology oncology Dr.Secord.  Given that cytology from left pleural  effusion is positive for metastatic carcinoma, tumor cells are positive for PAX 8 which indicates GYN origin.  Given that patient has moderate to large amount of pleural effusion, heavy tumor burden, recommend to start chemotherapy as soon as possible with carboplatin AUC 5-6 and Taxol 175 mg/m every 3 weeks for 3 cycles.  Patient will establish care with gynecology oncology for evaluation of participating in clinical trials after neoadjuvant chemo/surgery.  INTERVAL HISTORY 64 year old female presents for follow-up of pleural effusion, evaluation prior to chemotherapy. Patient has been to chemotherapy education class. Patient was accompanied by her niece today She reports feeling well.  No significant shortness of breath at resting or with exertion.  Denies any pain. Right shoulder is after surgery.  Pain is controlled.  Review of Systems  Constitutional: Negative for appetite change, chills, fatigue and fever.  HENT:   Negative for hearing loss and voice change.   Eyes: Negative for eye problems.  Respiratory: Negative for chest tightness and cough.   Cardiovascular: Negative for chest pain.  Gastrointestinal: Negative for abdominal distention, abdominal pain and blood in stool.  Endocrine: Negative for hot flashes.  Genitourinary: Negative for difficulty urinating and frequency.   Musculoskeletal: Negative for arthralgias.  Skin: Negative for itching and rash.  Neurological: Negative for extremity weakness.  Hematological: Negative for adenopathy.  Psychiatric/Behavioral: Negative for confusion.      No Known Allergies   Past Medical History:  Diagnosis Date  . HLD (hyperlipidemia)      Past Surgical History:  Procedure Laterality Date  . CARPAL TUNNEL RELEASE Left   . SHOULDER ARTHROSCOPY WITH SUBACROMIAL DECOMPRESSION AND OPEN ROTATOR C Right 02/01/2020   Procedure: RIGHT SHOULDER ARTHROSCOPIC SUBSCAPULARIS REPAIR, ROTATOR CUFF REPAIR, SUABCROMIAL DECOMPRESSION, AND  BICEPS  TENODESIS.;  Surgeon: Leim Fabry, MD;  Location: [facility] ORS;  Service: Orthopedics;  Laterality: Right;  . TUBAL LIGATION      Social History   Socioeconomic History  . Marital status: Single    Spouse name: Not on file  . Number of children: Not on file  . Years of education: Not on file  . Highest education level: Not on file  Occupational History  . Not on file  Tobacco Use  . Smoking status: Former Smoker    Packs/day: 0.75    Years: 40.00    Pack years: 30.00    Types: Cigarettes    Quit date: 02/06/2018    Years since quitting: 2.0  . Smokeless tobacco: Never Used  . Tobacco comment: quit 02/2018  Vaping Use  . Vaping Use: Never used  Substance and Sexual Activity  . Alcohol use: Yes    Comment: 2-3 glasses of wine per week  . Drug use: No  . Sexual activity: Not on file  Other Topics Concern  . Not on file  Social History Narrative   Lives in [place] alone. Work - [employer]   Diet: Regular   Exercise: walking   Social Determinants of Radio broadcast assistant Strain:   . Difficulty of Paying Living Expenses:   Food Insecurity: No Landscape architect  . Worried About Charity fundraiser in the Last Year: Never true  . Ran Out of Food in the Last Year: Never true  Transportation Needs: No Transportation Needs  . Lack of Transportation (Medical): No  . Lack of Transportation (Non-Medical): No  Physical Activity:   . Days of Exercise per Week:   . Minutes of Exercise per Session:   Stress: No Stress Concern Present  . Feeling of Stress : Not at all  Social Connections:   . Frequency of Communication with Friends and Family:   . Frequency of Social Gatherings with Friends and Family:   . Attends Religious Services:   . Active Member of Clubs or Organizations:   . Attends Archivist Meetings:   Marland Kitchen Marital Status:   Intimate Partner Violence: Not At Risk  . Fear of Current or Ex-Partner: No  . Emotionally Abused: No  . Physically Abused: No  .  Sexually Abused: No    Family History  Problem Relation Age of Onset  . Cancer Mother        lymphoma  . Stroke Sister   . Cancer Brother        lung  . Cancer Sister        lung?  . Breast cancer Cousin 45       maternal     Current Outpatient Medications:  .  acetaminophen (TYLENOL) 500 MG tablet, Take 2 tablets (1,000 mg total) by mouth every 8 (eight) hours., Disp: 90 tablet, Rfl: 2 .  cholecalciferol (QC VITAMIN D3) 10 MCG (400 UNIT) TABS tablet, Take 800 Units by mouth daily., Disp: , Rfl:  .  furosemide (LASIX) 40 MG tablet, Take 40 mg by mouth daily., Disp: , Rfl:  .  gabapentin (NEURONTIN) 100 MG capsule, Take 1 capsule (100 mg total) by mouth 3 (three) times daily. (Patient taking differently: Take 100 mg by mouth at bedtime. Taking 1 QD), Disp: 90 capsule, Rfl: 3 .  pravastatin (PRAVACHOL) 40 MG tablet, TAKE 1 TABLET(40 MG) BY MOUTH DAILY (Patient taking differently: Take 40 mg by mouth at bedtime. ), Disp: 90 tablet, Rfl: 0 .  lidocaine-prilocaine (EMLA) cream, Apply to affected area once (Patient not taking: Reported on 03/04/2020), Disp: 30 g, Rfl: 3 .  ondansetron (ZOFRAN ODT) 4 MG disintegrating tablet, Take 1 tablet (4 mg total) by mouth every 8 (eight) hours as needed for nausea or vomiting. (Patient not taking: Reported on 02/26/2020), Disp: 20 tablet, Rfl: 0 .  ondansetron (ZOFRAN) 8 MG tablet, Take 1 tablet (8 mg total) by mouth 2 (two) times daily as needed for refractory nausea / vomiting. Start on day 3 after carboplatin chemo. (Patient not taking: Reported on 03/04/2020), Disp: 30 tablet, Rfl: 1 .  oxyCODONE (ROXICODONE) 5 MG immediate release tablet, Take 1-2 tablets (5-10 mg total) by mouth every 4 (four) hours as needed (pain). (Patient not taking: Reported on 02/26/2020), Disp: 30 tablet, Rfl: 0 .  prochlorperazine (COMPAZINE) 10 MG tablet, Take 1 tablet (10 mg total) by mouth every 6 (six) hours as needed (Nausea or vomiting). (Patient not taking: Reported on  03/04/2020), Disp: 30 tablet, Rfl: 1 No current facility-administered medications for this visit.  Facility-Administered Medications Ordered in Other Visits:  .  CARBOplatin (PARAPLATIN) 590 mg in sodium chloride 0.9 % 250 mL chemo infusion, 590 mg, Intravenous, Once, Earlie Server, MD .  PACLitaxel (TAXOL) 390 mg in sodium chloride 0.9 % 500 mL chemo infusion (> 80mg /m2), 175 mg/m2 (Treatment Plan Recorded), Intravenous, Once, Earlie Server, MD, Last Rate: 188 mL/hr at 03/04/20 1140, 390 mg at 03/04/20 1140  Physical exam:  Vitals:   03/04/20 0834  BP: (!) 135/83  Pulse: 94  Temp: (!) 97.3 F (36.3 C)  TempSrc: Tympanic  SpO2: 95%  Weight: (!) 228 lb 9.6 oz (103.7 kg)   Physical Exam Constitutional:      General: She is not in acute distress. HENT:     Head: Normocephalic and atraumatic.  Eyes:     General: No scleral icterus. Cardiovascular:     Rate and Rhythm: Normal rate and regular rhythm.     Heart sounds: Normal heart sounds.  Pulmonary:     Effort: Pulmonary effort is normal. No respiratory distress.     Breath sounds: No wheezing.     Comments: Severely decreased breathing sounds.  left lower 1/3  Abdominal:     General: Bowel sounds are normal. There is no distension.     Palpations: Abdomen is soft.  Musculoskeletal:        General: No deformity. Normal range of motion.     Cervical back: Normal range of motion and neck supple.  Skin:    General: Skin is warm and dry.     Findings: No erythema or rash.  Neurological:     Mental Status: She is alert and oriented to person, place, and time. Mental status is at baseline.     Cranial Nerves: No cranial nerve deficit.     Coordination: Coordination normal.  Psychiatric:        Mood and Affect: Mood normal.     CMP Latest Ref Rng & Units 03/04/2020  Glucose 70 - 99 mg/dL 142(H)  BUN 8 - 23 mg/dL 10  Creatinine 0.44 - 1.00 mg/dL 0.88  Sodium 135 - 145 mmol/L 137  Potassium 3.5 - 5.1 mmol/L 3.7  Chloride 98 - 111  mmol/L 102  CO2 22 - 32 mmol/L 25  Calcium 8.9 - 10.3 mg/dL 9.0  Total Protein 6.5 - 8.1 g/dL 7.1  Total Bilirubin 0.3 - 1.2 mg/dL 0.7  Alkaline Phos 38 - 126 U/L 43  AST 15 -  41 U/L 18  ALT 0 - 44 U/L 18   CBC Latest Ref Rng & Units 03/04/2020  WBC 4.0 - 10.5 K/uL 8.1  Hemoglobin 12.0 - 15.0 g/dL 11.7(L)  Hematocrit 36 - 46 % 35.9(L)  Platelets 150 - 400 K/uL 270    RADIOGRAPHIC STUDIES: I have personally reviewed the radiological images as listed and agreed with the findings in the report. CT Chest Wo Contrast  Result Date: 02/17/2020 CLINICAL DATA:  64 year old female with pleural effusion. EXAM: CT CHEST WITHOUT CONTRAST TECHNIQUE: Multidetector CT imaging of the chest was performed following the standard protocol without IV contrast. COMPARISON:  Chest radiograph dated 02/17/2020 and CT dated 10/11/2019. FINDINGS: Evaluation of this exam is limited in the absence of intravenous contrast. Cardiovascular: There is no cardiomegaly or pericardial effusion. Minimal atherosclerotic calcification of the aortic arch. The aorta and central pulmonary arteries are otherwise grossly unremarkable on this noncontrast CT. Mediastinum/Nodes: There is slight shift of the mediastinum to the right of the midline secondary to mass effect caused by pleural effusion. No definite hilar or mediastinal adenopathy. Evaluation however is very limited in the absence of contrast as well as due to consolidative changes of the left lung. The esophagus and the thyroid gland are grossly unremarkable. No mediastinal fluid collection. Lungs/Pleura: There is a large left pleural effusion with complete consolidative changes of the left lower lobe and majority of the left upper lobe which may represent compressive atelectasis or infiltrate. Underlying mass is not excluded. Clinical correlation and follow-up recommended. Thoracentesis may provide diagnostic information and symptomatic relief. These findings are new since the study  of 10/11/2019. The right lung is clear. There is no pneumothorax. The central airways are patent. Upper Abdomen: Indeterminate 15 mm left adrenal nodule. Musculoskeletal: No chest wall mass or suspicious bone lesions identified. IMPRESSION: Large left pleural effusion with consolidative changes of the majority of the left lung, new since the prior CT of 10/11/2019. Clinical correlation and follow-up to resolution recommended. Thoracentesis may provide additional diagnostic information and symptomatic relief. Electronically Signed   By: Anner Crete M.D.   On: 02/17/2020 19:14   MR Brain W Wo Contrast  Result Date: 02/27/2020 CLINICAL DATA:  Metastatic carcinoma. Malignant pleural effusion. Intraperitoneal tumor. Staging for intracranial disease. EXAM: MRI HEAD WITHOUT AND WITH CONTRAST TECHNIQUE: Multiplanar, multiecho pulse sequences of the brain and surrounding structures were obtained without and with intravenous contrast. CONTRAST:  64mL GADAVIST GADOBUTROL 1 MMOL/ML IV SOLN COMPARISON:  None. FINDINGS: Brain: Diffusion imaging does not show any acute or subacute infarction. Brainstem and cerebellum are normal. There is a single old white matter infarction in the right posterior frontal white matter. No cortical or large vessel territory infarction. After contrast administration, no abnormal enhancement occurs. No evidence of primary or metastatic mass lesion. No leptomeningeal disease. Vascular: Major vessels at the base of the brain show flow. Skull and upper cervical spine: Negative Sinuses/Orbits: Clear/normal Other: None IMPRESSION: No evidence of regional metastatic disease. Normal study with exception of an old white matter infarction in the right posterior frontal region. Electronically Signed   By: Nelson Chimes M.D.   On: 02/27/2020 19:00   NM PET Image Initial (PI) Skull Base To Thigh  Result Date: 02/26/2020 CLINICAL DATA:  Initial treatment strategy for malignant pleural effusion. EXAM:  NUCLEAR MEDICINE PET SKULL BASE TO THIGH TECHNIQUE: 12.83 mCi F-18 FDG was injected intravenously. Full-ring PET imaging was performed from the skull base to thigh after the radiotracer. CT data was obtained  and used for attenuation correction and anatomic localization. Fasting blood glucose: 91 mg/dl COMPARISON:  None. FINDINGS: Mediastinal blood pool activity: SUV max 3.16 Liver activity: SUV max NA NECK: There is symmetric radiotracer uptake noted within the base of tongue and bilateral tonsillar regions with SUV max of 7.49. No FDG avid lymph nodes identified within the soft tissues of the neck. Incidental CT findings: none CHEST: No FDG avid axillary or supraclavicular lymph nodes. No FDG avid mediastinal or hilar lymph nodes. Known malignant, loculated left pleural effusion is noted with overlying compressive type atelectasis. This has decreased in volume when compared with pre thoracentesis chest CT dated 02/17/2020. There is a thin rind of mildly FDG avid soft tissue overlying the left lung compatible with malignant pleural effusion. The corresponding SUV max ranges between 3.25 and 2.83. There is a trace amount of right pleural fluid which also exhibits mild FDG uptake within SUV max of 3.15, image 93/3. No suspicious or FDG avid pulmonary nodule or mass identified. Incidental CT findings: Mild aortic atherosclerosis. ABDOMEN/PELVIS: There is no abnormal FDG uptake within the liver, pancreas, spleen, or adrenal glands. Left adrenal nodule measures 1.4 cm and does not exhibit any significant tracer uptake favoring a benign adenoma. No enlarged or FDG avid retroperitoneal or pelvic lymph nodes. FDG avid soft tissue infiltration of the omentum is identified compatible with peritoneal carcinomatosis. Focal area of increased omental soft tissue along the midline of the abdomen measures 4.7 x 2.2 cm and has an SUV max of 7.27. Additional small peritoneal nodules are noted scattered within the upper abdomen  including on image 169/3 and image 153/3. No ascites identified. Within the right pelvis abutting the right side of the uterus is a fluid density structure which measures 4.3 x 2.7 cm within SUV max of 4.20. Incidental CT findings: Aortic atherosclerosis. SKELETON: No focal hypermetabolic activity to suggest skeletal metastasis. Incidental CT findings: none IMPRESSION: 1. Exam shows evidence of peritoneal disease within the abdomen including FDG avid soft tissue infiltration into the omentum. 2. Large, loculated left pleural effusion with thin, FDG avid rind of soft tissue overlying the left lung. This corresponds to known, pathology proven malignant pleural effusion. 3. Small right pleural effusion with mild FDG uptake is equivocal for malignant effusion of the right hemithorax. 4. Fluid density structure within right side of pelvis abutting the right-side of the uterus is noted. This is indeterminate. Further investigation with pelvic sonogram may be helpful. Electronically Signed   By: Kerby Moors M.D.   On: 02/26/2020 15:36   DG Chest Port 1 View  Result Date: 02/19/2020 CLINICAL DATA:  Status post left thoracentesis EXAM: PORTABLE CHEST 1 VIEW COMPARISON:  02/18/2020 FINDINGS: Large left pleural effusion has been partially evacuated. Small residual left pleural effusion persists. Mild left basilar atelectasis results in mild left-sided volume loss. No pneumothorax. Right lung is clear. Cardiac size within normal limits. No acute bone abnormality. IMPRESSION: No pneumothorax following left thoracentesis. Small residual left pleural effusion. Electronically Signed   By: Fidela Salisbury MD   On: 02/19/2020 17:03   DG Chest Port 1 View  Result Date: 02/18/2020 CLINICAL DATA:  Status post left thoracentesis EXAM: PORTABLE CHEST 1 VIEW COMPARISON:  02/17/2020 FINDINGS: Large left pleural effusion remains, slightly decreased since prior study. No pneumothorax following thoracentesis. Left lower lobe  atelectasis. No focal opacity on the right. Heart is normal size. IMPRESSION: Large left pleural effusion remains following thoracentesis. No pneumothorax. Electronically Signed   By: Rolm Baptise M.D.  On: 02/18/2020 12:18   DG Chest Port 1 View  Result Date: 02/17/2020 CLINICAL DATA:  Chest pain and shortness of breath EXAM: PORTABLE CHEST 1 VIEW COMPARISON:  05/23/2016, 10/11/2019, 01/09/2020 FINDINGS: Single frontal view of the chest demonstrates interval opacification of the lower 2/3 of the left hemithorax, consistent with effusion and consolidation. Right chest is clear. No pneumothorax. No acute bony abnormalities. Chronic Hill-Sachs deformity and degenerative changes of the right shoulder. IMPRESSION: 1. Interval development of significant left lung consolidation and/or effusion. Electronically Signed   By: Randa Ngo M.D.   On: 02/17/2020 18:03   ECHOCARDIOGRAM COMPLETE  Result Date: 02/19/2020    ECHOCARDIOGRAM REPORT   Patient Name:   Linda Schmitt Date of Exam: 02/18/2020 Medical Rec #:  [id number]         Height:       63.0 in Accession #:    [id number]        Weight:       229.3 lb Date of Birth:  14-Sep-1955         BSA:          2.049 m Patient Age:    36 years          BP:           114/69 mmHg Patient Gender: F                 HR:           87 bpm. Exam Location:  [facility] Procedure: 2D Echo, Cardiac Doppler and Color Doppler Indications:     Chest Pain 786.50  History:         Patient has no prior history of Echocardiogram examinations.                  HLD.  Sonographer:     Alyse Low Roar Referring Phys:  [id number] Bonnielee Haff Diagnosing Phys: Nelva Bush MD IMPRESSIONS  1. Left ventricular ejection fraction, by estimation, is 65 to 70%. The left ventricle has normal function. Left ventricular endocardial border not optimally defined to evaluate regional wall motion. There is mild left ventricular hypertrophy. Left ventricular diastolic parameters are consistent with Grade I diastolic  dysfunction (impaired relaxation).  2. Right ventricular systolic function is normal. The right ventricular size is normal. Tricuspid regurgitation signal is inadequate for assessing PA pressure.  3. The mitral valve was not well visualized. No evidence of mitral valve regurgitation. No evidence of mitral stenosis.  4. The aortic valve was not well visualized. Aortic valve regurgitation is not visualized. No aortic stenosis is present. FINDINGS  Left Ventricle: Left ventricular ejection fraction, by estimation, is 65 to 70%. The left ventricle has normal function. Left ventricular endocardial border not optimally defined to evaluate regional wall motion. The left ventricular internal cavity size was normal in size. There is mild left ventricular hypertrophy. Left ventricular diastolic parameters are consistent with Grade I diastolic dysfunction (impaired relaxation). Right Ventricle: The right ventricular size is normal. No increase in right ventricular wall thickness. Right ventricular systolic function is normal. Tricuspid regurgitation signal is inadequate for assessing PA pressure. Left Atrium: Left atrial size was normal in size. Right Atrium: Right atrial size was normal in size. Pericardium: The pericardium was not well visualized. Mitral Valve: The mitral valve was not well visualized. No evidence of mitral valve regurgitation. No evidence of mitral valve stenosis. Tricuspid Valve: The tricuspid valve is not well visualized. Tricuspid valve regurgitation is trivial. Aortic Valve: The  aortic valve was not well visualized. Aortic valve regurgitation is not visualized. No aortic stenosis is present. Aortic valve mean gradient measures 6.0 mmHg. Aortic valve peak gradient measures 13.5 mmHg. Aortic valve area, by VTI measures 2.16 cm. Pulmonic Valve: The pulmonic valve was not well visualized. Pulmonic valve regurgitation is not visualized. No evidence of pulmonic stenosis. Aorta: The aortic root is normal in  size and structure. Pulmonary Artery: The pulmonary artery is not well seen. Venous: The inferior vena cava was not well visualized. IAS/Shunts: The interatrial septum was not well visualized.  LEFT VENTRICLE PLAX 2D LVIDd:         4.25 cm  Diastology LVIDs:         2.95 cm  LV e' lateral:   7.29 cm/s LV PW:         1.09 cm  LV E/e' lateral: 10.0 LV IVS:        1.09 cm  LV e' medial:    7.18 cm/s LVOT diam:     1.70 cm  LV E/e' medial:  10.2 LV SV:         52 LV SV Index:   25 LVOT Area:     2.27 cm  RIGHT VENTRICLE RV S prime:     14.10 cm/s LEFT ATRIUM             Index       RIGHT ATRIUM          Index LA diam:        3.60 cm 1.76 cm/m  RA Area:     8.68 cm LA Vol (A2C):   36.9 ml 18.01 ml/m RA Volume:   14.90 ml 7.27 ml/m LA Vol (A4C):   27.1 ml 13.22 ml/m LA Biplane Vol: 31.7 ml 15.47 ml/m  AORTIC VALVE                    PULMONIC VALVE AV Area (Vmax):    2.06 cm     PV Vmax:        0.92 m/s AV Area (Vmean):   2.07 cm     PV Peak grad:   3.4 mmHg AV Area (VTI):     2.16 cm     RVOT Peak grad: 4 mmHg AV Vmax:           184.00 cm/s AV Vmean:          104.000 cm/s AV VTI:            0.238 m AV Peak Grad:      13.5 mmHg AV Mean Grad:      6.0 mmHg LVOT Vmax:         167.00 cm/s LVOT Vmean:        94.900 cm/s LVOT VTI:          0.227 m LVOT/AV VTI ratio: 0.95  AORTA Ao Root diam: 2.40 cm MITRAL VALVE MV Area (PHT): 5.13 cm     SHUNTS MV Decel Time: 148 msec     Systemic VTI:  0.23 m MV E velocity: 73.10 cm/s   Systemic Diam: 1.70 cm MV A velocity: 109.00 cm/s MV E/A ratio:  0.67 MV A Prime:    16.4 cm/s Nelva Bush MD Electronically signed by Nelva Bush MD Signature Date/Time: 02/19/2020/1:00:30 PM    Final    US PELVIC COMPLETE WITH TRANSVAGINAL  Result Date: 02/26/2020 CLINICAL DATA:  Malignant pleural effusion, abnormal PET-CT with a cystic structure in the RIGHT  adnexa; postmenopausal EXAM: TRANSABDOMINAL AND TRANSVAGINAL ULTRASOUND OF PELVIS TECHNIQUE: Both transabdominal and transvaginal  ultrasound examinations of the pelvis were performed. Transabdominal technique was performed for global imaging of the pelvis including uterus, ovaries, adnexal regions, and pelvic cul-de-sac. It was necessary to proceed with endovaginal exam following the transabdominal exam to visualize the uterus, endometrium, and ovaries. COMPARISON:  PET-CT 01/26/2020 FINDINGS: Uterus Measurements: 6.2 x 2.7 x 3.5 cm = volume: 30 mL. Anteverted. Heterogeneous myometrium. No focal mass Endometrium Thickness: 4 mm. Poorly defined. No definite mass or endometrial fluid Right ovary Questionable visualization of RIGHT ovary see below Left ovary Measurements: 1.9 x 0.9 x 1.1 cm = volume: 0.4 mL. Normal morphology without mass Other findings Trace free pelvic fluid. Question cystic lesions seen on prior PET-CT is not definitely visualized. A small focus of soft tissue is identified in the RIGHT adnexa 2.0 x 1.1 x 1.0 cm, suspect representing a small RIGHT ovary. IMPRESSION: Heterogeneous myometrium with poor definition of endometrial complex. Normal LEFT ovary. No cystic RIGHT adnexal lesion is identified, though a probable small normal appearing RIGHT ovary is identified. Suspect that the low-attenuation presume cystic structure seen in the RIGHT adnexa on the prior CT corresponds to a small amount of nonspecific free fluid in the RIGHT adnexa. Electronically Signed   By: Lavonia Dana M.D.   On: 02/26/2020 17:43   US THORACENTESIS ASP PLEURAL SPACE W/IMG GUIDE  Result Date: 02/19/2020 INDICATION: Prior smoker with history of dyspnea and recurrent malignant left pleural effusion. Request made for therapeutic left thoracentesis. EXAM: ULTRASOUND GUIDED THERAPEUTIC LEFT THORACENTESIS MEDICATIONS: 1% lidocaine to skin and subcutaneous tissue COMPLICATIONS: None immediate. PROCEDURE: An ultrasound guided thoracentesis was thoroughly discussed with the patient and questions answered. The benefits, risks, alternatives and complications  were also discussed. The patient understands and wishes to proceed with the procedure. Written consent was obtained. Ultrasound was performed to localize and mark an adequate pocket of fluid in the left chest. The area was then prepped and draped in the normal sterile fashion. 1% Lidocaine was used for local anesthesia. Under ultrasound guidance a 6 Fr Safe-T-Centesis catheter was introduced. Thoracentesis was performed. The catheter was removed and a dressing applied. FINDINGS: A total of approximately 1.2 liters of bloody fluid was removed. Due to patient coughing and chest discomfort only the above amount of fluid was removed today. IMPRESSION: Successful ultrasound guided therapeutic left thoracentesis yielding 1.2 liters of pleural fluid. Read by: Rowe Robert, PA-C Electronically Signed   By: Aletta Edouard M.D.   On: 02/19/2020 16:38   US THORACENTESIS ASP PLEURAL SPACE W/IMG GUIDE  Result Date: 02/18/2020 INDICATION: Patient with history of hyperlipidemia and elective right shoulder surgery performed on February 12, 2020. Patient presents to this facility with shortness of breath and chest pain found to have a pleural effusion. Request is for therapeutic and diagnostic left-sided thoracentesis EXAM: ULTRASOUND GUIDED LEFT THERAPEUTIC AND DIAGNOSTIC THORACENTESIS MEDICATIONS: Lidocaine 1% 10 mL. COMPLICATIONS: None immediate. Patient unable to tolerate additional fluid removal at this time. PROCEDURE: An ultrasound guided thoracentesis was thoroughly discussed with the patient and questions answered. The benefits, risks, alternatives and complications were also discussed. The patient understands and wishes to proceed with the procedure. Written consent was obtained. Ultrasound was performed to localize and mark an adequate pocket of fluid in the left chest. The area was then prepped and draped in the normal sterile fashion. 1% Lidocaine was used for local anesthesia. Under ultrasound guidance a 6 Fr  Safe-T-Centesis catheter was introduced.  Thoracentesis was performed. The catheter was removed and a dressing applied. FINDINGS: A total of approximately 1.8 L of serosanguineous fluid was removed. Samples were sent to the laboratory as requested by the clinical team. IMPRESSION: Successful ultrasound guided left-sided therapeutic and diagnostic thoracentesis yielding 1.8 L of pleural fluid. Read by: Rushie Nyhan, NP Electronically Signed   By: Markus Daft M.D.   On: 02/18/2020 12:38     Assessment and plan Patient is a 64 y.o. female presents for follow-up of pleural effusion 1. Malignant pleural effusion   2. Mass of omentum   3. Encounter for antineoplastic chemotherapy    #Left malignant pleural effusion Currently she is asymptomatic.  Continue monitor. 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  CA-125 is elevated. Given the moderate to large amount of pleural effusion, cancer burden, I recommend to start chemotherapy with carboplatin and Taxol every 3 weeks for 3 cycles. Patient has appointment tomorrow for omental mass biopsy to see if that will facilitate narrow down to urology. Patient will need gynecology oncology next week. The diagnosis and care plan were discussed with patient in detail. The goal of treatment which is to palliate disease, disease related symptoms, improve quality of life and hopefully prolong life was highlighted in our discussion.  Chemotherapy education was provided.  We had discussed the composition of chemotherapy regimen, length of chemo cycle, duration of treatment and the time to assess response to treatment.   We will send germline testing for mutations.  Refer to genetic counselor  I explained to the patient the risks and benefits of chemotherapy including all but not limited to hair loss, mouth sore, nausea, vomiting, diarrhea, low blood counts, bleeding, neuropathy and risk of  life threatening infection and even death, secondary malignancy etc.  . Patient voices understanding and willing to proceed chemotherapy.   # pre-existing neuropathy on left hand. She take gabapentin 100mg  daily at bedtime.  Monitor symptoms.  # Chemotherapy education; Antiemetics-Zofran and Compazine;  sent to pharmacy. Discussed with patient that Mediport placement is an option for her.  She will update me if she decides to proceed with a Mediport placement.  Today will be prechemotherapy given via her peripheral vein.  Supportive care measures are necessary for patient well-being and will be provided as necessary. We spent sufficient time to discuss many aspect of care, questions were answered to patient's satisfaction.   Orders Placed This Encounter  Procedures  . CBC with Differential    Standing Status:   Standing    Number of Occurrences:   20    Standing Expiration Date:   03/04/2021  . Comprehensive metabolic panel    Standing Status:   Standing    Number of Occurrences:   20    Standing Expiration Date:   03/04/2021    Follow-up 1 week   Earlie Server, MD, PhD Hematology Oncology [facility] Pager- [phone] 03/04/2020

## 2020-03-04 NOTE — Progress Notes (Signed)
Patient here for follow up and new treatment. No concerns voiced.

## 2020-03-05 ENCOUNTER — Telehealth: Payer: Self-pay

## 2020-03-05 ENCOUNTER — Ambulatory Visit
Admission: RE | Admit: 2020-03-05 | Discharge: 2020-03-05 | Disposition: A | Discharge status: Home / Self Care | Payer: Medicare Other | Source: Ambulatory Visit | Attending: Oncology | Admitting: Oncology

## 2020-03-05 ENCOUNTER — Other Ambulatory Visit: Payer: Self-pay

## 2020-03-05 DIAGNOSIS — Z87891 Personal history of nicotine dependence: Secondary | ICD-10-CM | POA: Diagnosis not present

## 2020-03-05 DIAGNOSIS — E785 Hyperlipidemia, unspecified: Secondary | ICD-10-CM | POA: Diagnosis not present

## 2020-03-05 DIAGNOSIS — K668 Other specified disorders of peritoneum: Secondary | ICD-10-CM

## 2020-03-05 DIAGNOSIS — C799 Secondary malignant neoplasm of unspecified site: Secondary | ICD-10-CM | POA: Diagnosis present

## 2020-03-05 DIAGNOSIS — Z79899 Other long term (current) drug therapy: Secondary | ICD-10-CM | POA: Insufficient documentation

## 2020-03-05 DIAGNOSIS — J910 Malignant pleural effusion: Secondary | ICD-10-CM

## 2020-03-05 DIAGNOSIS — J90 Pleural effusion, not elsewhere classified: Secondary | ICD-10-CM | POA: Insufficient documentation

## 2020-03-05 MED ORDER — MIDAZOLAM HCL 5 MG/5ML IJ SOLN
INTRAMUSCULAR | Status: AC
  Filled 2020-03-05: qty 5

## 2020-03-05 MED ORDER — FENTANYL CITRATE (PF) 100 MCG/2ML IJ SOLN
INTRAMUSCULAR | Status: AC
  Filled 2020-03-05: qty 2

## 2020-03-05 MED ORDER — FENTANYL CITRATE (PF) 100 MCG/2ML IJ SOLN
INTRAMUSCULAR | Status: AC | PRN
  Administered 2020-03-05 (×2): 50 ug via INTRAVENOUS

## 2020-03-05 MED ORDER — MIDAZOLAM HCL 2 MG/2ML IJ SOLN
INTRAMUSCULAR | Status: AC | PRN
  Administered 2020-03-05 (×2): 1 mg via INTRAVENOUS

## 2020-03-05 MED ORDER — SODIUM CHLORIDE 0.9 % IV SOLN: INTRAVENOUS | Status: DC

## 2020-03-05 NOTE — H&P (Signed)
Chief Complaint: Omental mass  Referring Physician(s): Yu,Zhou  Supervising Physician: Corrie Mckusick  Patient Status: [facility] - Out-pt  History of Present Illness: Linda Schmitt is a 64 y.o. female presenting for image guided biopsy of omental mass.   Unknown primary.    She denies any new symptoms today.   Past Medical History:  Diagnosis Date  . HLD (hyperlipidemia)     Past Surgical History:  Procedure Laterality Date  . CARPAL TUNNEL RELEASE Left   . SHOULDER ARTHROSCOPY WITH SUBACROMIAL DECOMPRESSION AND OPEN ROTATOR C Right 02/01/2020   Procedure: RIGHT SHOULDER ARTHROSCOPIC SUBSCAPULARIS REPAIR, ROTATOR CUFF REPAIR, SUABCROMIAL DECOMPRESSION, AND BICEPS TENODESIS.;  Surgeon: Leim Fabry, MD;  Location: [facility] ORS;  Service: Orthopedics;  Laterality: Right;  . TUBAL LIGATION      Allergies: Patient has no known allergies.  Medications: Prior to Admission medications   Medication Sig Start Date End Date Taking? Authorizing Provider  acetaminophen (TYLENOL) 500 MG tablet Take 2 tablets (1,000 mg total) by mouth every 8 (eight) hours. 02/01/20 01/31/21 Yes Leim Fabry, MD  cholecalciferol (QC VITAMIN D3) 10 MCG (400 UNIT) TABS tablet Take 800 Units by mouth daily.   Yes [provider]  furosemide (LASIX) 40 MG tablet Take 40 mg by mouth daily. 02/28/20  Yes [provider]  gabapentin (NEURONTIN) 100 MG capsule Take 1 capsule (100 mg total) by mouth 3 (three) times daily. Patient taking differently: Take 100 mg by mouth at bedtime. Taking 1 QD 12/28/19  Yes Arnett, Yvetta Coder, FNP  ondansetron (ZOFRAN) 8 MG tablet Take 1 tablet (8 mg total) by mouth 2 (two) times daily as needed for refractory nausea / vomiting. Start on day 3 after carboplatin chemo. 03/04/20  Yes Earlie Server, MD  pravastatin (PRAVACHOL) 40 MG tablet TAKE 1 TABLET(40 MG) BY MOUTH DAILY Patient taking differently: Take 40 mg by mouth at bedtime.  12/31/19  Yes Burnard Hawthorne, FNP    lidocaine-prilocaine (EMLA) cream Apply to affected area once Patient not taking: Reported on 03/04/2020 03/04/20   Earlie Server, MD  oxyCODONE (ROXICODONE) 5 MG immediate release tablet Take 1-2 tablets (5-10 mg total) by mouth every 4 (four) hours as needed (pain). Patient not taking: Reported on 02/26/2020 02/01/20 01/31/21  Leim Fabry, MD  prochlorperazine (COMPAZINE) 10 MG tablet Take 1 tablet (10 mg total) by mouth every 6 (six) hours as needed (Nausea or vomiting). Patient not taking: Reported on 03/04/2020 03/04/20   Earlie Server, MD     Family History  Problem Relation Age of Onset  . Cancer Mother        lymphoma  . Stroke Sister   . Cancer Brother        lung  . Cancer Sister        lung?  . Breast cancer Cousin 54       maternal    Social History   Socioeconomic History  . Marital status: Single    Spouse name: Not on file  . Number of children: Not on file  . Years of education: Not on file  . Highest education level: Not on file  Occupational History  . Not on file  Tobacco Use  . Smoking status: Former Smoker    Packs/day: 0.75    Years: 40.00    Pack years: 30.00    Types: Cigarettes    Quit date: 02/06/2018    Years since quitting: 2.0  . Smokeless tobacco: Never Used  . Tobacco comment: quit 02/2018  Vaping Use  . Vaping Use: Never used  Substance and Sexual Activity  . Alcohol use: Yes    Comment: 2-3 glasses of wine per week  . Drug use: No  . Sexual activity: Not on file  Other Topics Concern  . Not on file  Social History Narrative   Lives in [place] alone. Work - [employer]   Diet: Regular   Exercise: walking   Social Determinants of Radio broadcast assistant Strain:   . Difficulty of Paying Living Expenses:   Food Insecurity: No Landscape architect  . Worried About Charity fundraiser in the Last Year: Never true  . Ran Out of Food in the Last Year: Never true  Transportation Needs: No Transportation Needs  . Lack of Transportation (Medical): No   . Lack of Transportation (Non-Medical): No  Physical Activity:   . Days of Exercise per Week:   . Minutes of Exercise per Session:   Stress: No Stress Concern Present  . Feeling of Stress : Not at all  Social Connections:   . Frequency of Communication with Friends and Family:   . Frequency of Social Gatherings with Friends and Family:   . Attends Religious Services:   . Active Member of Clubs or Organizations:   . Attends Archivist Meetings:   Marland Kitchen Marital Status:        Review of Systems: A 12 point ROS discussed and pertinent positives are indicated in the HPI above.  All other systems are negative.  Review of Systems  Vital Signs: BP (!) 143/86   Pulse 94   Temp 98.2 F (36.8 C)   Resp 20   Ht 5\' 3"  (1.6 m)   Wt (!) 103.4 kg   SpO2 97%   BMI 40.39 kg/m   Physical Exam General: 64 yo female appearing stated age.  Well-developed, well-nourished.  No distress. HEENT: Atraumatic, normocephalic.  Conjugate gaze, extra-ocular motor intact. No scleral icterus or scleral injection. No lesions on external ears, nose, lips, or gums.  Oral mucosa moist, pink.  Neck: Symmetric with no goiter enlargement.  Chest/Lungs:  Symmetric chest with inspiration/expiration.  No labored breathing.  Clear to auscultation with no wheezes, rhonchi, or rales.  Heart:  RRR, with no third heart sounds appreciated. No JVD appreciated.  Abdomen:  Soft, NT/ND, with + bowel sounds.   Genito-urinary: Deferred Neurologic: Alert & Oriented to person, place, and time.   Normal affect and insight.  Appropriate questions.  Moving all 4 extremities with gross sensory intact.      Imaging: CT Chest Wo Contrast  Result Date: 02/17/2020 CLINICAL DATA:  64 year old female with pleural effusion. EXAM: CT CHEST WITHOUT CONTRAST TECHNIQUE: Multidetector CT imaging of the chest was performed following the standard protocol without IV contrast. COMPARISON:  Chest radiograph dated 02/17/2020 and CT dated  10/11/2019. FINDINGS: Evaluation of this exam is limited in the absence of intravenous contrast. Cardiovascular: There is no cardiomegaly or pericardial effusion. Minimal atherosclerotic calcification of the aortic arch. The aorta and central pulmonary arteries are otherwise grossly unremarkable on this noncontrast CT. Mediastinum/Nodes: There is slight shift of the mediastinum to the right of the midline secondary to mass effect caused by pleural effusion. No definite hilar or mediastinal adenopathy. Evaluation however is very limited in the absence of contrast as well as due to consolidative changes of the left lung. The esophagus and the thyroid gland are grossly unremarkable. No mediastinal fluid collection. Lungs/Pleura: There is a large left pleural effusion  with complete consolidative changes of the left lower lobe and majority of the left upper lobe which may represent compressive atelectasis or infiltrate. Underlying mass is not excluded. Clinical correlation and follow-up recommended. Thoracentesis may provide diagnostic information and symptomatic relief. These findings are new since the study of 10/11/2019. The right lung is clear. There is no pneumothorax. The central airways are patent. Upper Abdomen: Indeterminate 15 mm left adrenal nodule. Musculoskeletal: No chest wall mass or suspicious bone lesions identified. IMPRESSION: Large left pleural effusion with consolidative changes of the majority of the left lung, new since the prior CT of 10/11/2019. Clinical correlation and follow-up to resolution recommended. Thoracentesis may provide additional diagnostic information and symptomatic relief. Electronically Signed   By: Anner Crete M.D.   On: 02/17/2020 19:14   MR Brain W Wo Contrast  Result Date: 02/27/2020 CLINICAL DATA:  Metastatic carcinoma. Malignant pleural effusion. Intraperitoneal tumor. Staging for intracranial disease. EXAM: MRI HEAD WITHOUT AND WITH CONTRAST TECHNIQUE:  Multiplanar, multiecho pulse sequences of the brain and surrounding structures were obtained without and with intravenous contrast. CONTRAST:  52mL GADAVIST GADOBUTROL 1 MMOL/ML IV SOLN COMPARISON:  None. FINDINGS: Brain: Diffusion imaging does not show any acute or subacute infarction. Brainstem and cerebellum are normal. There is a single old white matter infarction in the right posterior frontal white matter. No cortical or large vessel territory infarction. After contrast administration, no abnormal enhancement occurs. No evidence of primary or metastatic mass lesion. No leptomeningeal disease. Vascular: Major vessels at the base of the brain show flow. Skull and upper cervical spine: Negative Sinuses/Orbits: Clear/normal Other: None IMPRESSION: No evidence of regional metastatic disease. Normal study with exception of an old white matter infarction in the right posterior frontal region. Electronically Signed   By: Nelson Chimes M.D.   On: 02/27/2020 19:00   NM PET Image Initial (PI) Skull Base To Thigh  Result Date: 02/26/2020 CLINICAL DATA:  Initial treatment strategy for malignant pleural effusion. EXAM: NUCLEAR MEDICINE PET SKULL BASE TO THIGH TECHNIQUE: 12.83 mCi F-18 FDG was injected intravenously. Full-ring PET imaging was performed from the skull base to thigh after the radiotracer. CT data was obtained and used for attenuation correction and anatomic localization. Fasting blood glucose: 91 mg/dl COMPARISON:  None. FINDINGS: Mediastinal blood pool activity: SUV max 3.16 Liver activity: SUV max NA NECK: There is symmetric radiotracer uptake noted within the base of tongue and bilateral tonsillar regions with SUV max of 7.49. No FDG avid lymph nodes identified within the soft tissues of the neck. Incidental CT findings: none CHEST: No FDG avid axillary or supraclavicular lymph nodes. No FDG avid mediastinal or hilar lymph nodes. Known malignant, loculated left pleural effusion is noted with overlying  compressive type atelectasis. This has decreased in volume when compared with pre thoracentesis chest CT dated 02/17/2020. There is a thin rind of mildly FDG avid soft tissue overlying the left lung compatible with malignant pleural effusion. The corresponding SUV max ranges between 3.25 and 2.83. There is a trace amount of right pleural fluid which also exhibits mild FDG uptake within SUV max of 3.15, image 93/3. No suspicious or FDG avid pulmonary nodule or mass identified. Incidental CT findings: Mild aortic atherosclerosis. ABDOMEN/PELVIS: There is no abnormal FDG uptake within the liver, pancreas, spleen, or adrenal glands. Left adrenal nodule measures 1.4 cm and does not exhibit any significant tracer uptake favoring a benign adenoma. No enlarged or FDG avid retroperitoneal or pelvic lymph nodes. FDG avid soft tissue infiltration of the  omentum is identified compatible with peritoneal carcinomatosis. Focal area of increased omental soft tissue along the midline of the abdomen measures 4.7 x 2.2 cm and has an SUV max of 7.27. Additional small peritoneal nodules are noted scattered within the upper abdomen including on image 169/3 and image 153/3. No ascites identified. Within the right pelvis abutting the right side of the uterus is a fluid density structure which measures 4.3 x 2.7 cm within SUV max of 4.20. Incidental CT findings: Aortic atherosclerosis. SKELETON: No focal hypermetabolic activity to suggest skeletal metastasis. Incidental CT findings: none IMPRESSION: 1. Exam shows evidence of peritoneal disease within the abdomen including FDG avid soft tissue infiltration into the omentum. 2. Large, loculated left pleural effusion with thin, FDG avid rind of soft tissue overlying the left lung. This corresponds to known, pathology proven malignant pleural effusion. 3. Small right pleural effusion with mild FDG uptake is equivocal for malignant effusion of the right hemithorax. 4. Fluid density structure  within right side of pelvis abutting the right-side of the uterus is noted. This is indeterminate. Further investigation with pelvic sonogram may be helpful. Electronically Signed   By: Kerby Moors M.D.   On: 02/26/2020 15:36   DG Chest Port 1 View  Result Date: 02/19/2020 CLINICAL DATA:  Status post left thoracentesis EXAM: PORTABLE CHEST 1 VIEW COMPARISON:  02/18/2020 FINDINGS: Large left pleural effusion has been partially evacuated. Small residual left pleural effusion persists. Mild left basilar atelectasis results in mild left-sided volume loss. No pneumothorax. Right lung is clear. Cardiac size within normal limits. No acute bone abnormality. IMPRESSION: No pneumothorax following left thoracentesis. Small residual left pleural effusion. Electronically Signed   By: Fidela Salisbury MD   On: 02/19/2020 17:03   DG Chest Port 1 View  Result Date: 02/18/2020 CLINICAL DATA:  Status post left thoracentesis EXAM: PORTABLE CHEST 1 VIEW COMPARISON:  02/17/2020 FINDINGS: Large left pleural effusion remains, slightly decreased since prior study. No pneumothorax following thoracentesis. Left lower lobe atelectasis. No focal opacity on the right. Heart is normal size. IMPRESSION: Large left pleural effusion remains following thoracentesis. No pneumothorax. Electronically Signed   By: Rolm Baptise M.D.   On: 02/18/2020 12:18   DG Chest Port 1 View  Result Date: 02/17/2020 CLINICAL DATA:  Chest pain and shortness of breath EXAM: PORTABLE CHEST 1 VIEW COMPARISON:  05/23/2016, 10/11/2019, 01/09/2020 FINDINGS: Single frontal view of the chest demonstrates interval opacification of the lower 2/3 of the left hemithorax, consistent with effusion and consolidation. Right chest is clear. No pneumothorax. No acute bony abnormalities. Chronic Hill-Sachs deformity and degenerative changes of the right shoulder. IMPRESSION: 1. Interval development of significant left lung consolidation and/or effusion. Electronically Signed    By: Randa Ngo M.D.   On: 02/17/2020 18:03   ECHOCARDIOGRAM COMPLETE  Result Date: 02/19/2020    ECHOCARDIOGRAM REPORT   Patient Name:   Linda Schmitt Date of Exam: 02/18/2020 Medical Rec #:  [id number]         Height:       63.0 in Accession #:    [id number]        Weight:       229.3 lb Date of Birth:  1956/02/09         BSA:          2.049 m Patient Age:    65 years          BP:           114/69 mmHg Patient  Gender: F                 HR:           87 bpm. Exam Location:  [facility] Procedure: 2D Echo, Cardiac Doppler and Color Doppler Indications:     Chest Pain 786.50  History:         Patient has no prior history of Echocardiogram examinations.                  HLD.  Sonographer:     Alyse Low Roar Referring Phys:  [id number] Bonnielee Haff Diagnosing Phys: Nelva Bush MD IMPRESSIONS  1. Left ventricular ejection fraction, by estimation, is 65 to 70%. The left ventricle has normal function. Left ventricular endocardial border not optimally defined to evaluate regional wall motion. There is mild left ventricular hypertrophy. Left ventricular diastolic parameters are consistent with Grade I diastolic dysfunction (impaired relaxation).  2. Right ventricular systolic function is normal. The right ventricular size is normal. Tricuspid regurgitation signal is inadequate for assessing PA pressure.  3. The mitral valve was not well visualized. No evidence of mitral valve regurgitation. No evidence of mitral stenosis.  4. The aortic valve was not well visualized. Aortic valve regurgitation is not visualized. No aortic stenosis is present. FINDINGS  Left Ventricle: Left ventricular ejection fraction, by estimation, is 65 to 70%. The left ventricle has normal function. Left ventricular endocardial border not optimally defined to evaluate regional wall motion. The left ventricular internal cavity size was normal in size. There is mild left ventricular hypertrophy. Left ventricular diastolic parameters are consistent with  Grade I diastolic dysfunction (impaired relaxation). Right Ventricle: The right ventricular size is normal. No increase in right ventricular wall thickness. Right ventricular systolic function is normal. Tricuspid regurgitation signal is inadequate for assessing PA pressure. Left Atrium: Left atrial size was normal in size. Right Atrium: Right atrial size was normal in size. Pericardium: The pericardium was not well visualized. Mitral Valve: The mitral valve was not well visualized. No evidence of mitral valve regurgitation. No evidence of mitral valve stenosis. Tricuspid Valve: The tricuspid valve is not well visualized. Tricuspid valve regurgitation is trivial. Aortic Valve: The aortic valve was not well visualized. Aortic valve regurgitation is not visualized. No aortic stenosis is present. Aortic valve mean gradient measures 6.0 mmHg. Aortic valve peak gradient measures 13.5 mmHg. Aortic valve area, by VTI measures 2.16 cm. Pulmonic Valve: The pulmonic valve was not well visualized. Pulmonic valve regurgitation is not visualized. No evidence of pulmonic stenosis. Aorta: The aortic root is normal in size and structure. Pulmonary Artery: The pulmonary artery is not well seen. Venous: The inferior vena cava was not well visualized. IAS/Shunts: The interatrial septum was not well visualized.  LEFT VENTRICLE PLAX 2D LVIDd:         4.25 cm  Diastology LVIDs:         2.95 cm  LV e' lateral:   7.29 cm/s LV PW:         1.09 cm  LV E/e' lateral: 10.0 LV IVS:        1.09 cm  LV e' medial:    7.18 cm/s LVOT diam:     1.70 cm  LV E/e' medial:  10.2 LV SV:         52 LV SV Index:   25 LVOT Area:     2.27 cm  RIGHT VENTRICLE RV S prime:     14.10 cm/s LEFT ATRIUM  Index       RIGHT ATRIUM          Index LA diam:        3.60 cm 1.76 cm/m  RA Area:     8.68 cm LA Vol (A2C):   36.9 ml 18.01 ml/m RA Volume:   14.90 ml 7.27 ml/m LA Vol (A4C):   27.1 ml 13.22 ml/m LA Biplane Vol: 31.7 ml 15.47 ml/m  AORTIC VALVE                     PULMONIC VALVE AV Area (Vmax):    2.06 cm     PV Vmax:        0.92 m/s AV Area (Vmean):   2.07 cm     PV Peak grad:   3.4 mmHg AV Area (VTI):     2.16 cm     RVOT Peak grad: 4 mmHg AV Vmax:           184.00 cm/s AV Vmean:          104.000 cm/s AV VTI:            0.238 m AV Peak Grad:      13.5 mmHg AV Mean Grad:      6.0 mmHg LVOT Vmax:         167.00 cm/s LVOT Vmean:        94.900 cm/s LVOT VTI:          0.227 m LVOT/AV VTI ratio: 0.95  AORTA Ao Root diam: 2.40 cm MITRAL VALVE MV Area (PHT): 5.13 cm     SHUNTS MV Decel Time: 148 msec     Systemic VTI:  0.23 m MV E velocity: 73.10 cm/s   Systemic Diam: 1.70 cm MV A velocity: 109.00 cm/s MV E/A ratio:  0.67 MV A Prime:    16.4 cm/s Nelva Bush MD Electronically signed by Nelva Bush MD Signature Date/Time: 02/19/2020/1:00:30 PM    Final    US PELVIC COMPLETE WITH TRANSVAGINAL  Result Date: 02/26/2020 CLINICAL DATA:  Malignant pleural effusion, abnormal PET-CT with a cystic structure in the RIGHT adnexa; postmenopausal EXAM: TRANSABDOMINAL AND TRANSVAGINAL ULTRASOUND OF PELVIS TECHNIQUE: Both transabdominal and transvaginal ultrasound examinations of the pelvis were performed. Transabdominal technique was performed for global imaging of the pelvis including uterus, ovaries, adnexal regions, and pelvic cul-de-sac. It was necessary to proceed with endovaginal exam following the transabdominal exam to visualize the uterus, endometrium, and ovaries. COMPARISON:  PET-CT 01/26/2020 FINDINGS: Uterus Measurements: 6.2 x 2.7 x 3.5 cm = volume: 30 mL. Anteverted. Heterogeneous myometrium. No focal mass Endometrium Thickness: 4 mm. Poorly defined. No definite mass or endometrial fluid Right ovary Questionable visualization of RIGHT ovary see below Left ovary Measurements: 1.9 x 0.9 x 1.1 cm = volume: 0.4 mL. Normal morphology without mass Other findings Trace free pelvic fluid. Question cystic lesions seen on prior PET-CT is not definitely  visualized. A small focus of soft tissue is identified in the RIGHT adnexa 2.0 x 1.1 x 1.0 cm, suspect representing a small RIGHT ovary. IMPRESSION: Heterogeneous myometrium with poor definition of endometrial complex. Normal LEFT ovary. No cystic RIGHT adnexal lesion is identified, though a probable small normal appearing RIGHT ovary is identified. Suspect that the low-attenuation presume cystic structure seen in the RIGHT adnexa on the prior CT corresponds to a small amount of nonspecific free fluid in the RIGHT adnexa. Electronically Signed   By: Lavonia Dana M.D.   On: 02/26/2020 17:43  US THORACENTESIS ASP PLEURAL SPACE W/IMG GUIDE  Result Date: 02/19/2020 INDICATION: Prior smoker with history of dyspnea and recurrent malignant left pleural effusion. Request made for therapeutic left thoracentesis. EXAM: ULTRASOUND GUIDED THERAPEUTIC LEFT THORACENTESIS MEDICATIONS: 1% lidocaine to skin and subcutaneous tissue COMPLICATIONS: None immediate. PROCEDURE: An ultrasound guided thoracentesis was thoroughly discussed with the patient and questions answered. The benefits, risks, alternatives and complications were also discussed. The patient understands and wishes to proceed with the procedure. Written consent was obtained. Ultrasound was performed to localize and mark an adequate pocket of fluid in the left chest. The area was then prepped and draped in the normal sterile fashion. 1% Lidocaine was used for local anesthesia. Under ultrasound guidance a 6 Fr Safe-T-Centesis catheter was introduced. Thoracentesis was performed. The catheter was removed and a dressing applied. FINDINGS: A total of approximately 1.2 liters of bloody fluid was removed. Due to patient coughing and chest discomfort only the above amount of fluid was removed today. IMPRESSION: Successful ultrasound guided therapeutic left thoracentesis yielding 1.2 liters of pleural fluid. Read by: Rowe Robert, PA-C Electronically Signed   By: Aletta Edouard M.D.   On: 02/19/2020 16:38   US THORACENTESIS ASP PLEURAL SPACE W/IMG GUIDE  Result Date: 02/18/2020 INDICATION: Patient with history of hyperlipidemia and elective right shoulder surgery performed on February 12, 2020. Patient presents to this facility with shortness of breath and chest pain found to have a pleural effusion. Request is for therapeutic and diagnostic left-sided thoracentesis EXAM: ULTRASOUND GUIDED LEFT THERAPEUTIC AND DIAGNOSTIC THORACENTESIS MEDICATIONS: Lidocaine 1% 10 mL. COMPLICATIONS: None immediate. Patient unable to tolerate additional fluid removal at this time. PROCEDURE: An ultrasound guided thoracentesis was thoroughly discussed with the patient and questions answered. The benefits, risks, alternatives and complications were also discussed. The patient understands and wishes to proceed with the procedure. Written consent was obtained. Ultrasound was performed to localize and mark an adequate pocket of fluid in the left chest. The area was then prepped and draped in the normal sterile fashion. 1% Lidocaine was used for local anesthesia. Under ultrasound guidance a 6 Fr Safe-T-Centesis catheter was introduced. Thoracentesis was performed. The catheter was removed and a dressing applied. FINDINGS: A total of approximately 1.8 L of serosanguineous fluid was removed. Samples were sent to the laboratory as requested by the clinical team. IMPRESSION: Successful ultrasound guided left-sided therapeutic and diagnostic thoracentesis yielding 1.8 L of pleural fluid. Read by: Rushie Nyhan, NP Electronically Signed   By: Markus Daft M.D.   On: 02/18/2020 12:38    Labs:  CBC: Recent Labs    02/17/20 1739 02/18/20 0528 02/19/20 0720 03/04/20 0803  WBC 11.5* 9.1 8.7 8.1  HGB 12.8 11.9* 12.0 11.7*  HCT 39.5 35.4* 37.8 35.9*  PLT 297 247 262 270    COAGS: Recent Labs    02/17/20 2344  INR 1.1    BMP: Recent Labs    02/17/20 1739 02/18/20 0528 02/19/20 0720  03/04/20 0803  NA 140 142 138 137  K 3.9 4.0 3.9 3.7  CL 106 109 102 102  CO2 25 29 26 25   GLUCOSE 143* 127* 124* 142*  BUN 11 9 9 10   CALCIUM 9.1 8.7* 8.8* 9.0  CREATININE 0.89 0.83 0.83 0.88  GFRNONAA >60 >60 >60 >60  GFRAA >60 >60 >60 >60    LIVER FUNCTION TESTS: Recent Labs    12/28/19 0937 02/18/20 1309 02/19/20 0720 03/04/20 0803  BILITOT 0.6 0.9 0.9 0.7  AST 18 17 16  18  ALT 17 18 17 18   ALKPHOS 54 42 40 43  PROT 6.8 6.9 6.5 7.1  ALBUMIN 4.4 3.6 3.4* 3.8    TUMOR MARKERS: No results for input(s): AFPTM, CEA, CA199, CHROMGRNA in the last 8760 hours.  Assessment and Plan:  Ms Kuwahara presents for biopsy of omental mass.    Risks and benefits of omental mass biopsy was discussed with the patient and/or patient's family including, but not limited to bleeding, infection, damage to adjacent structures or low yield requiring additional tests.  All of the questions were answered and there is agreement to proceed.  Consent signed and in chart.   Thank you for this interesting consult.  I greatly enjoyed meeting YURITZI KAMP and look forward to participating in their care.  A copy of this report was sent to the requesting provider on this date.  Electronically Signed: Corrie Mckusick, DO 03/05/2020, 10:04 AM   I spent a total of  30 Minutes   in face to face in clinical consultation, greater than 50% of which was counseling/coordinating care for ct guided biopsy of omental mass.

## 2020-03-05 NOTE — Procedures (Signed)
Interventional Radiology Procedure Note  Procedure:  CT guided biopsy of omental mass. Multiple 18g core biopsy.   Complications: None  Recommendations:  - Ok to shower tomorrow - Do not submerge for 7 days - Routine care - advance diet - dc home 1 hr   Signed,  Dulcy Fanny. Earleen Newport, DO

## 2020-03-05 NOTE — Discharge Instructions (Signed)
Moderate Conscious Sedation, Adult, Care After These instructions provide you with information about caring for yourself after your procedure. Your health care provider may also give you more specific instructions. Your treatment has been planned according to current medical practices, but problems sometimes occur. Call your health care provider if you have any problems or questions after your procedure. What can I expect after the procedure? After your procedure, it is common:  To feel sleepy for several hours.  To feel clumsy and have poor balance for several hours.  To have poor judgment for several hours.  To vomit if you eat too soon. Follow these instructions at home: For at least 24 hours after the procedure:   Do not: ? Participate in activities where you could fall or become injured. ? Drive. ? Use heavy machinery. ? Drink alcohol. ? Take sleeping pills or medicines that cause drowsiness. ? Make important decisions or sign legal documents. ? Take care of children on your own.  Rest. Eating and drinking  Follow the diet recommended by your health care provider.  If you vomit: ? Drink water, juice, or soup when you can drink without vomiting. ? Make sure you have little or no nausea before eating solid foods. General instructions  Have a responsible adult stay with you until you are awake and alert.  Take over-the-counter and prescription medicines only as told by your health care provider.  If you smoke, do not smoke without supervision.  Keep all follow-up visits as told by your health care provider. This is important. Contact a health care provider if:  You keep feeling nauseous or you keep vomiting.  You feel light-headed.  You develop a rash.  You have a fever. Get help right away if:  You have trouble breathing. This information is not intended to replace advice given to you by your health care provider. Make sure you discuss any questions you have  with your health care provider. Document Revised: 07/08/2017 Document Reviewed: 11/15/2015 Elsevier Patient Education  2020 [employer] Needle Biopsy, Care After These instructions tell you how to care for yourself after your procedure. Your doctor may also give you more specific instructions. Call your doctor if you have any problems or questions. What can I expect after the procedure? After the procedure, it is common to have:  Soreness.  Bruising.  Mild pain. Follow these instructions at home:   Return to your normal activities as told by your doctor. Ask your doctor what activities are safe for you.  Take over-the-counter and prescription medicines only as told by your doctor.  Wash your hands with soap and water before you change your bandage (dressing). If you cannot use soap and water, use hand sanitizer.  Follow instructions from your doctor about: ? How to take care of your puncture site. ? When and how to change your bandage. ? When to remove your bandage.  Check your puncture site every day for signs of infection. Watch for: ? Redness, swelling, or pain. ? Fluid or blood. ? Pus or a bad smell. ? Warmth.  Do not take baths, swim, or use a hot tub until your doctor approves. Ask your doctor if you may take showers. You may only be allowed to take sponge baths.  Keep all follow-up visits as told by your doctor. This is important. Contact a doctor if you have:  A fever.  Redness, swelling, or pain at the puncture site, and it lasts longer than a few days.  Fluid,   blood, or pus coming from the puncture site.  Warmth coming from the puncture site. Get help right away if:  You have a lot of bleeding from the puncture site. Summary  After the procedure, it is common to have soreness, bruising, or mild pain at the puncture site.  Check your puncture site every day for signs of infection, such as redness, swelling, or pain.  Get help right away if you have  severe bleeding from your puncture site. This information is not intended to replace advice given to you by your health care provider. Make sure you discuss any questions you have with your health care provider. Document Revised: 08/08/2017 Document Reviewed: 08/08/2017 Elsevier Patient Education  2020 [employer]  

## 2020-03-05 NOTE — Progress Notes (Signed)
Patient clinically stable post Omental CT Biopsy per Dr Earleen Newport, tolerated well. Denies complaints post procedure. Received Versed 2mg  along with Fentanyl 153mcg IV for procedure. bandade dressing dry and intact to lower right abdomen. Awake/alert and oriented post procedure. Vitals stable. Discharge instructions given with questions answered. Eating and po's without difficulty.

## 2020-03-05 NOTE — Telephone Encounter (Signed)
T/C to pt for follow up after receiving first chemo yesterday.   No answer but left message on voice mail letting her know we were calling to check on her and how her first infusion went.   Encouraged pt to call for any questions or concerns.

## 2020-03-06 ENCOUNTER — Telehealth: Payer: Self-pay | Admitting: *Deleted

## 2020-03-06 NOTE — Telephone Encounter (Signed)
Patient called reporting that she is having intermittent scalp tingling and is asking if that is normal after chemotherapy. Please advise

## 2020-03-06 NOTE — Telephone Encounter (Signed)
Symptoms have resolved.

## 2020-03-07 LAB — SURGICAL PATHOLOGY

## 2020-03-11 ENCOUNTER — Inpatient Hospital Stay: Payer: Medicare Other

## 2020-03-11 ENCOUNTER — Other Ambulatory Visit: Payer: Self-pay

## 2020-03-11 ENCOUNTER — Encounter: Payer: Self-pay | Admitting: Oncology

## 2020-03-11 ENCOUNTER — Inpatient Hospital Stay (HOSPITAL_BASED_OUTPATIENT_CLINIC_OR_DEPARTMENT_OTHER): Payer: Medicare Other | Admitting: Oncology

## 2020-03-11 ENCOUNTER — Inpatient Hospital Stay: Payer: Medicare Other | Attending: Oncology

## 2020-03-11 VITALS — BP 135/91 | HR 108 | Temp 96.4°F | Resp 18 | Wt 226.8 lb

## 2020-03-11 DIAGNOSIS — C763 Malignant neoplasm of pelvis: Secondary | ICD-10-CM

## 2020-03-11 DIAGNOSIS — J910 Malignant pleural effusion: Secondary | ICD-10-CM | POA: Diagnosis present

## 2020-03-11 DIAGNOSIS — E785 Hyperlipidemia, unspecified: Secondary | ICD-10-CM | POA: Diagnosis not present

## 2020-03-11 DIAGNOSIS — G629 Polyneuropathy, unspecified: Secondary | ICD-10-CM | POA: Insufficient documentation

## 2020-03-11 DIAGNOSIS — Z87891 Personal history of nicotine dependence: Secondary | ICD-10-CM | POA: Diagnosis not present

## 2020-03-11 DIAGNOSIS — R971 Elevated cancer antigen 125 [CA 125]: Secondary | ICD-10-CM | POA: Diagnosis not present

## 2020-03-11 DIAGNOSIS — Z79899 Other long term (current) drug therapy: Secondary | ICD-10-CM | POA: Insufficient documentation

## 2020-03-11 DIAGNOSIS — Z5111 Encounter for antineoplastic chemotherapy: Secondary | ICD-10-CM | POA: Diagnosis not present

## 2020-03-11 LAB — COMPREHENSIVE METABOLIC PANEL
ALT: 18 U/L (ref 0–44)
AST: 20 U/L (ref 15–41)
Albumin: 4.1 g/dL (ref 3.5–5.0)
Alkaline Phosphatase: 43 U/L (ref 38–126)
Anion gap: 10 (ref 5–15)
BUN: 11 mg/dL (ref 8–23)
CO2: 26 mmol/L (ref 22–32)
Calcium: 8.9 mg/dL (ref 8.9–10.3)
Chloride: 101 mmol/L (ref 98–111)
Creatinine, Ser: 0.83 mg/dL (ref 0.44–1.00)
GFR calc Af Amer: >60 mL/min (ref >60)
GFR calc non Af Amer: >60 mL/min (ref >60)
Glucose, Bld: 123 mg/dL — ABNORMAL HIGH (ref 70–99)
Potassium: 3.9 mmol/L (ref 3.5–5.1)
Sodium: 137 mmol/L (ref 135–145)
Total Bilirubin: 0.7 mg/dL (ref 0.3–1.2)
Total Protein: 7.2 g/dL (ref 6.5–8.1)

## 2020-03-11 LAB — CBC WITH DIFFERENTIAL/PLATELET
Abs Immature Granulocytes: 0.02 10*3/uL (ref 0.00–0.07)
Basophils Absolute: 0 10*3/uL (ref 0.0–0.1)
Basophils Relative: 0 %
Eosinophils Absolute: 0.1 10*3/uL (ref 0.0–0.5)
Eosinophils Relative: 3 %
HCT: 36.5 % (ref 36.0–46.0)
Hemoglobin: 11.9 g/dL — ABNORMAL LOW (ref 12.0–15.0)
Immature Granulocytes: 1 %
Lymphocytes Relative: 34 %
Lymphs Abs: 1.3 10*3/uL (ref 0.7–4.0)
MCH: 28.6 pg (ref 26.0–34.0)
MCHC: 32.6 g/dL (ref 30.0–36.0)
MCV: 87.7 fL (ref 80.0–100.0)
Monocytes Absolute: 0.1 10*3/uL (ref 0.1–1.0)
Monocytes Relative: 3 %
Neutro Abs: 2.2 10*3/uL (ref 1.7–7.7)
Neutrophils Relative %: 59 %
Platelets: 209 10*3/uL (ref 150–400)
RBC: 4.16 MIL/uL (ref 3.87–5.11)
RDW: 13 % (ref 11.5–15.5)
WBC: 3.7 10*3/uL — ABNORMAL LOW (ref 4.0–10.5)
nRBC: 0 % (ref 0.0–0.2)

## 2020-03-11 NOTE — Progress Notes (Signed)
Hematology/Oncology Follow Up Note [facility]  [phone] [phone]  Patient Care Team: Burnard Hawthorne, FNP as PCP - General (Family Medicine) Clent Jacks, RN as Oncology Nurse Navigator   Name of the patient: Linda Schmitt  [id number]  04/21/1956   REASON FOR VISIT  follow-up for malignant pleural effusion  PERTINENT ONCOLOGY HISTORY Linda Schmitt is a 64 y.o.afemale who has above oncology history reviewed by me today presented for follow up visit for management of malignant pleural effusion status post thoracentesis x2 during her recent admission. Cytology was positive for metastatic carcinoma.  TTF-1 Napsin A, GATA3, CDX2 were negative. Positive for PAX 8, which is most often expressing tumors of gynecological and renal origin. PET scan was independently reviewed by me and discussed with patient.  I also discussed with radiology. Exam showed evidence of peritoneal disease within the abdomen, soft tissue infiltrating into the omentum.  Large loculated left pleural effusion with thin FDG avid rind of soft tissue overlying the left lung.  Small right pleural effusion with mild FDG uptake is equivocal for malignant effusion of the right hemithorax. Fluid density structure within the right side of the pelvis abutting the right-sided of uterus is noted.  This is indeterminate.  Further investigation with pelvic sonogram may be helpful.  Pelvic ultrasound was obtained.-Heterogeneous myometrium with poor definition of endometrial complex. Normal left ovary.  No cystic right adnexal lesion is identified.  No a probable small normal-appearing right ovary is identified.  Suspect that the low-attenuation presumed cystic structure seen in the right adnexa on the prior image corresponds to a small amount of nonspecific free fluid in the right adnexa. I discussed with gynecology oncology Dr.Secord.  Given that cytology from left pleural  effusion is positive for metastatic carcinoma, tumor cells are positive for PAX 8 which indicates GYN origin.  Given that patient has moderate to large amount of pleural effusion, heavy tumor burden, recommend to start chemotherapy as soon as possible with carboplatin AUC 5-6 and Taxol 175 mg/m every 3 weeks for 3 cycles.  Patient will establish care with gynecology oncology for evaluation of participating in clinical trials after neoadjuvant chemo/surgery.  #Pre-existing neuropathy of left hand, she takes gabapentin.  INTERVAL HISTORY 64 year old female presents for follow-up of pleural effusion, metastatic serous carcinoma Patient is status post 1 cycle of carboplatin and Taxol treatment. Overall she tolerates well. Denies any worsening of her breathing condition.  Denies any pain.  She had experienced some heartburn symptoms which improved after taking Tums.  Denies any nausea or vomiting or fever, diarrhea.. During interval, patient also underwent omental mass biopsy. Review of Systems  Constitutional: Negative for appetite change, chills, fatigue and fever.  HENT:   Negative for hearing loss and voice change.   Eyes: Negative for eye problems.  Respiratory: Negative for chest tightness and cough.   Cardiovascular: Negative for chest pain.  Gastrointestinal: Negative for abdominal distention, abdominal pain and blood in stool.  Endocrine: Negative for hot flashes.  Genitourinary: Negative for difficulty urinating and frequency.   Musculoskeletal: Negative for arthralgias.  Skin: Negative for itching and rash.  Neurological: Positive for numbness. Negative for extremity weakness.  Hematological: Negative for adenopathy.  Psychiatric/Behavioral: Negative for confusion.      No Known Allergies   Past Medical History:  Diagnosis Date  . HLD (hyperlipidemia)      Past Surgical History:  Procedure Laterality Date  . CARPAL TUNNEL RELEASE Left   . SHOULDER ARTHROSCOPY  WITH  SUBACROMIAL DECOMPRESSION AND OPEN ROTATOR C Right 02/01/2020   Procedure: RIGHT SHOULDER ARTHROSCOPIC SUBSCAPULARIS REPAIR, ROTATOR CUFF REPAIR, SUABCROMIAL DECOMPRESSION, AND BICEPS TENODESIS.;  Surgeon: Leim Fabry, MD;  Location: [facility] ORS;  Service: Orthopedics;  Laterality: Right;  . TUBAL LIGATION      Social History   Socioeconomic History  . Marital status: Single    Spouse name: Not on file  . Number of children: Not on file  . Years of education: Not on file  . Highest education level: Not on file  Occupational History  . Not on file  Tobacco Use  . Smoking status: Former Smoker    Packs/day: 0.75    Years: 40.00    Pack years: 30.00    Types: Cigarettes    Quit date: 02/06/2018    Years since quitting: 2.0  . Smokeless tobacco: Never Used  . Tobacco comment: quit 02/2018  Vaping Use  . Vaping Use: Never used  Substance and Sexual Activity  . Alcohol use: Yes    Comment: 2-3 glasses of wine per week  . Drug use: No  . Sexual activity: Not on file  Other Topics Concern  . Not on file  Social History Narrative   Lives in [place] alone. Work - [employer]   Diet: Regular   Exercise: walking   Social Determinants of Radio broadcast assistant Strain:   . Difficulty of Paying Living Expenses:   Food Insecurity: No Landscape architect  . Worried About Charity fundraiser in the Last Year: Never true  . Ran Out of Food in the Last Year: Never true  Transportation Needs: No Transportation Needs  . Lack of Transportation (Medical): No  . Lack of Transportation (Non-Medical): No  Physical Activity:   . Days of Exercise per Week:   . Minutes of Exercise per Session:   Stress: No Stress Concern Present  . Feeling of Stress : Not at all  Social Connections:   . Frequency of Communication with Friends and Family:   . Frequency of Social Gatherings with Friends and Family:   . Attends Religious Services:   . Active Member of Clubs or Organizations:   . Attends Theatre manager Meetings:   Marland Kitchen Marital Status:   Intimate Partner Violence: Not At Risk  . Fear of Current or Ex-Partner: No  . Emotionally Abused: No  . Physically Abused: No  . Sexually Abused: No    Family History  Problem Relation Age of Onset  . Cancer Mother        lymphoma  . Stroke Sister   . Cancer Brother        lung  . Cancer Sister        lung?  . Breast cancer Cousin 45       maternal     Current Outpatient Medications:  .  acetaminophen (TYLENOL) 500 MG tablet, Take 2 tablets (1,000 mg total) by mouth every 8 (eight) hours., Disp: 90 tablet, Rfl: 2 .  cholecalciferol (QC VITAMIN D3) 10 MCG (400 UNIT) TABS tablet, Take 800 Units by mouth daily., Disp: , Rfl:  .  furosemide (LASIX) 40 MG tablet, Take 40 mg by mouth daily., Disp: , Rfl:  .  gabapentin (NEURONTIN) 100 MG capsule, Take 1 capsule (100 mg total) by mouth 3 (three) times daily. (Patient taking differently: Take 100 mg by mouth at bedtime. Taking 1 QD), Disp: 90 capsule, Rfl: 3 .  ondansetron (ZOFRAN) 8 MG tablet, Take 1  tablet (8 mg total) by mouth 2 (two) times daily as needed for refractory nausea / vomiting. Start on day 3 after carboplatin chemo., Disp: 30 tablet, Rfl: 1 .  pravastatin (PRAVACHOL) 40 MG tablet, TAKE 1 TABLET(40 MG) BY MOUTH DAILY (Patient taking differently: Take 40 mg by mouth at bedtime. ), Disp: 90 tablet, Rfl: 0 .  lidocaine-prilocaine (EMLA) cream, Apply to affected area once (Patient not taking: Reported on 03/04/2020), Disp: 30 g, Rfl: 3 .  oxyCODONE (ROXICODONE) 5 MG immediate release tablet, Take 1-2 tablets (5-10 mg total) by mouth every 4 (four) hours as needed (pain). (Patient not taking: Reported on 02/26/2020), Disp: 30 tablet, Rfl: 0 .  prochlorperazine (COMPAZINE) 10 MG tablet, Take 1 tablet (10 mg total) by mouth every 6 (six) hours as needed (Nausea or vomiting). (Patient not taking: Reported on 03/04/2020), Disp: 30 tablet, Rfl: 1  Physical exam:  Vitals:   03/11/20 1105    BP: (!) 135/91  Pulse: (!) 108  Resp: 18  Temp: (!) 96.4 F (35.8 C)  TempSrc: Tympanic  SpO2: 99%  Weight: 226 lb 12.8 oz (102.9 kg)   Physical Exam Constitutional:      General: She is not in acute distress. HENT:     Head: Normocephalic and atraumatic.  Eyes:     General: No scleral icterus. Cardiovascular:     Rate and Rhythm: Normal rate and regular rhythm.     Heart sounds: Normal heart sounds.  Pulmonary:     Effort: Pulmonary effort is normal. No respiratory distress.     Breath sounds: No wheezing.     Comments: Diminished breathing sounds.  left lower 1/3  Abdominal:     General: Bowel sounds are normal. There is no distension.     Palpations: Abdomen is soft.  Musculoskeletal:        General: No deformity. Normal range of motion.     Cervical back: Normal range of motion and neck supple.  Skin:    General: Skin is warm and dry.     Findings: No erythema or rash.  Neurological:     Mental Status: She is alert and oriented to person, place, and time. Mental status is at baseline.     Cranial Nerves: No cranial nerve deficit.     Coordination: Coordination normal.  Psychiatric:        Mood and Affect: Mood normal.     CMP Latest Ref Rng & Units 03/11/2020  Glucose 70 - 99 mg/dL 123(H)  BUN 8 - 23 mg/dL 11  Creatinine 0.44 - 1.00 mg/dL 0.83  Sodium 135 - 145 mmol/L 137  Potassium 3.5 - 5.1 mmol/L 3.9  Chloride 98 - 111 mmol/L 101  CO2 22 - 32 mmol/L 26  Calcium 8.9 - 10.3 mg/dL 8.9  Total Protein 6.5 - 8.1 g/dL 7.2  Total Bilirubin 0.3 - 1.2 mg/dL 0.7  Alkaline Phos 38 - 126 U/L 43  AST 15 - 41 U/L 20  ALT 0 - 44 U/L 18   CBC Latest Ref Rng & Units 03/11/2020  WBC 4.0 - 10.5 K/uL 3.7(L)  Hemoglobin 12.0 - 15.0 g/dL 11.9(L)  Hematocrit 36 - 46 % 36.5  Platelets 150 - 400 K/uL 209    RADIOGRAPHIC STUDIES: I have personally reviewed the radiological images as listed and agreed with the findings in the report. CT Chest Wo Contrast  Result Date:  02/17/2020 CLINICAL DATA:  64 year old female with pleural effusion. EXAM: CT CHEST WITHOUT CONTRAST TECHNIQUE: Multidetector CT  imaging of the chest was performed following the standard protocol without IV contrast. COMPARISON:  Chest radiograph dated 02/17/2020 and CT dated 10/11/2019. FINDINGS: Evaluation of this exam is limited in the absence of intravenous contrast. Cardiovascular: There is no cardiomegaly or pericardial effusion. Minimal atherosclerotic calcification of the aortic arch. The aorta and central pulmonary arteries are otherwise grossly unremarkable on this noncontrast CT. Mediastinum/Nodes: There is slight shift of the mediastinum to the right of the midline secondary to mass effect caused by pleural effusion. No definite hilar or mediastinal adenopathy. Evaluation however is very limited in the absence of contrast as well as due to consolidative changes of the left lung. The esophagus and the thyroid gland are grossly unremarkable. No mediastinal fluid collection. Lungs/Pleura: There is a large left pleural effusion with complete consolidative changes of the left lower lobe and majority of the left upper lobe which may represent compressive atelectasis or infiltrate. Underlying mass is not excluded. Clinical correlation and follow-up recommended. Thoracentesis may provide diagnostic information and symptomatic relief. These findings are new since the study of 10/11/2019. The right lung is clear. There is no pneumothorax. The central airways are patent. Upper Abdomen: Indeterminate 15 mm left adrenal nodule. Musculoskeletal: No chest wall mass or suspicious bone lesions identified. IMPRESSION: Large left pleural effusion with consolidative changes of the majority of the left lung, new since the prior CT of 10/11/2019. Clinical correlation and follow-up to resolution recommended. Thoracentesis may provide additional diagnostic information and symptomatic relief. Electronically Signed   By: Anner Crete M.D.   On: 02/17/2020 19:14   MR Brain W Wo Contrast  Result Date: 02/27/2020 CLINICAL DATA:  Metastatic carcinoma. Malignant pleural effusion. Intraperitoneal tumor. Staging for intracranial disease. EXAM: MRI HEAD WITHOUT AND WITH CONTRAST TECHNIQUE: Multiplanar, multiecho pulse sequences of the brain and surrounding structures were obtained without and with intravenous contrast. CONTRAST:  92mL GADAVIST GADOBUTROL 1 MMOL/ML IV SOLN COMPARISON:  None. FINDINGS: Brain: Diffusion imaging does not show any acute or subacute infarction. Brainstem and cerebellum are normal. There is a single old white matter infarction in the right posterior frontal white matter. No cortical or large vessel territory infarction. After contrast administration, no abnormal enhancement occurs. No evidence of primary or metastatic mass lesion. No leptomeningeal disease. Vascular: Major vessels at the base of the brain show flow. Skull and upper cervical spine: Negative Sinuses/Orbits: Clear/normal Other: None IMPRESSION: No evidence of regional metastatic disease. Normal study with exception of an old white matter infarction in the right posterior frontal region. Electronically Signed   By: Nelson Chimes M.D.   On: 02/27/2020 19:00   NM PET Image Initial (PI) Skull Base To Thigh  Result Date: 02/26/2020 CLINICAL DATA:  Initial treatment strategy for malignant pleural effusion. EXAM: NUCLEAR MEDICINE PET SKULL BASE TO THIGH TECHNIQUE: 12.83 mCi F-18 FDG was injected intravenously. Full-ring PET imaging was performed from the skull base to thigh after the radiotracer. CT data was obtained and used for attenuation correction and anatomic localization. Fasting blood glucose: 91 mg/dl COMPARISON:  None. FINDINGS: Mediastinal blood pool activity: SUV max 3.16 Liver activity: SUV max NA NECK: There is symmetric radiotracer uptake noted within the base of tongue and bilateral tonsillar regions with SUV max of 7.49. No FDG avid  lymph nodes identified within the soft tissues of the neck. Incidental CT findings: none CHEST: No FDG avid axillary or supraclavicular lymph nodes. No FDG avid mediastinal or hilar lymph nodes. Known malignant, loculated left pleural effusion is noted with  overlying compressive type atelectasis. This has decreased in volume when compared with pre thoracentesis chest CT dated 02/17/2020. There is a thin rind of mildly FDG avid soft tissue overlying the left lung compatible with malignant pleural effusion. The corresponding SUV max ranges between 3.25 and 2.83. There is a trace amount of right pleural fluid which also exhibits mild FDG uptake within SUV max of 3.15, image 93/3. No suspicious or FDG avid pulmonary nodule or mass identified. Incidental CT findings: Mild aortic atherosclerosis. ABDOMEN/PELVIS: There is no abnormal FDG uptake within the liver, pancreas, spleen, or adrenal glands. Left adrenal nodule measures 1.4 cm and does not exhibit any significant tracer uptake favoring a benign adenoma. No enlarged or FDG avid retroperitoneal or pelvic lymph nodes. FDG avid soft tissue infiltration of the omentum is identified compatible with peritoneal carcinomatosis. Focal area of increased omental soft tissue along the midline of the abdomen measures 4.7 x 2.2 cm and has an SUV max of 7.27. Additional small peritoneal nodules are noted scattered within the upper abdomen including on image 169/3 and image 153/3. No ascites identified. Within the right pelvis abutting the right side of the uterus is a fluid density structure which measures 4.3 x 2.7 cm within SUV max of 4.20. Incidental CT findings: Aortic atherosclerosis. SKELETON: No focal hypermetabolic activity to suggest skeletal metastasis. Incidental CT findings: none IMPRESSION: 1. Exam shows evidence of peritoneal disease within the abdomen including FDG avid soft tissue infiltration into the omentum. 2. Large, loculated left pleural effusion with thin,  FDG avid rind of soft tissue overlying the left lung. This corresponds to known, pathology proven malignant pleural effusion. 3. Small right pleural effusion with mild FDG uptake is equivocal for malignant effusion of the right hemithorax. 4. Fluid density structure within right side of pelvis abutting the right-side of the uterus is noted. This is indeterminate. Further investigation with pelvic sonogram may be helpful. Electronically Signed   By: Kerby Moors M.D.   On: 02/26/2020 15:36   CT Biopsy  Result Date: 03/05/2020 INDICATION: 64 year old female with a history of FDG avid omental mass EXAM: CT BIOPSY MEDICATIONS: None. ANESTHESIA/SEDATION: Moderate (conscious) sedation was employed during this procedure. A total of Versed 2.0 mg and Fentanyl 100 mcg was administered intravenously. Moderate Sedation Time: 15 minutes. The patient's level of consciousness and vital signs were monitored continuously by radiology nursing throughout the procedure under my direct supervision. FLUOROSCOPY TIME:  CT COMPLICATIONS: None PROCEDURE: Informed written consent was obtained from the patient after a thorough discussion of the procedural risks, benefits and alternatives. All questions were addressed. Maximal Sterile Barrier Technique was utilized including caps, mask, sterile gowns, sterile gloves, sterile drape, hand hygiene and skin antiseptic. A timeout was performed prior to the initiation of the procedure. Patient positioned supine position on the CT gantry table. Scout CT was acquired for planning purposes. Using CT guidance, 18 gauge needle was advanced into the omental mass from a tangential approach, with a safe, greater than 3 cm margin to the adjacent bowel. Multiple 18 gauge core biopsy were performed. Two separate devices were required given failure of the first device. Specimens placed into formalin. Needle was removed and a final CT was acquired. Patient tolerated the procedure well and remained  hemodynamically stable throughout. No complications were encountered and no significant blood loss. IMPRESSION: Status post CT-guided omental mass biopsy. Signed, Dulcy Fanny. Dellia Nims, [facility] Electronically Signed   By: Corrie Mckusick D.O.  On: 03/05/2020 11:34   DG Chest Port 1 View  Result Date: 02/19/2020 CLINICAL DATA:  Status post left thoracentesis EXAM: PORTABLE CHEST 1 VIEW COMPARISON:  02/18/2020 FINDINGS: Large left pleural effusion has been partially evacuated. Small residual left pleural effusion persists. Mild left basilar atelectasis results in mild left-sided volume loss. No pneumothorax. Right lung is clear. Cardiac size within normal limits. No acute bone abnormality. IMPRESSION: No pneumothorax following left thoracentesis. Small residual left pleural effusion. Electronically Signed   By: Fidela Salisbury MD   On: 02/19/2020 17:03   DG Chest Port 1 View  Result Date: 02/18/2020 CLINICAL DATA:  Status post left thoracentesis EXAM: PORTABLE CHEST 1 VIEW COMPARISON:  02/17/2020 FINDINGS: Large left pleural effusion remains, slightly decreased since prior study. No pneumothorax following thoracentesis. Left lower lobe atelectasis. No focal opacity on the right. Heart is normal size. IMPRESSION: Large left pleural effusion remains following thoracentesis. No pneumothorax. Electronically Signed   By: Rolm Baptise M.D.   On: 02/18/2020 12:18   DG Chest Port 1 View  Result Date: 02/17/2020 CLINICAL DATA:  Chest pain and shortness of breath EXAM: PORTABLE CHEST 1 VIEW COMPARISON:  05/23/2016, 10/11/2019, 01/09/2020 FINDINGS: Single frontal view of the chest demonstrates interval opacification of the lower 2/3 of the left hemithorax, consistent with effusion and consolidation. Right chest is clear. No pneumothorax. No acute bony abnormalities. Chronic Hill-Sachs deformity and degenerative changes of the right shoulder. IMPRESSION:  1. Interval development of significant left lung consolidation and/or effusion. Electronically Signed   By: Randa Ngo M.D.   On: 02/17/2020 18:03   ECHOCARDIOGRAM COMPLETE  Result Date: 02/19/2020    ECHOCARDIOGRAM REPORT   Patient Name:   SHEKINA CORDELL Date of Exam: 02/18/2020 Medical Rec #:  [id number]         Height:       63.0 in Accession #:    [id number]        Weight:       229.3 lb Date of Birth:  August 20, 1955         BSA:          2.049 m Patient Age:    29 years          BP:           114/69 mmHg Patient Gender: F                 HR:           87 bpm. Exam Location:  [facility] Procedure: 2D Echo, Cardiac Doppler and Color Doppler Indications:     Chest Pain 786.50  History:         Patient has no prior history of Echocardiogram examinations.                  HLD.  Sonographer:     Alyse Low Roar Referring Phys:  [id number] Bonnielee Haff Diagnosing Phys: Nelva Bush MD IMPRESSIONS  1. Left ventricular ejection fraction, by estimation, is 65 to 70%. The left ventricle has normal function. Left ventricular endocardial border not optimally defined to evaluate regional wall motion. There is mild left ventricular hypertrophy. Left ventricular diastolic parameters are consistent with Grade I diastolic dysfunction (impaired relaxation).  2. Right ventricular systolic function is normal. The right ventricular size is normal. Tricuspid regurgitation signal is inadequate for assessing PA pressure.  3. The mitral valve was not well visualized. No evidence of mitral valve regurgitation. No evidence of mitral stenosis.  4. The aortic  valve was not well visualized. Aortic valve regurgitation is not visualized. No aortic stenosis is present. FINDINGS  Left Ventricle: Left ventricular ejection fraction, by estimation, is 65 to 70%. The left ventricle has normal function. Left ventricular endocardial border not optimally defined to evaluate regional wall motion. The left ventricular internal cavity size was normal in size.  There is mild left ventricular hypertrophy. Left ventricular diastolic parameters are consistent with Grade I diastolic dysfunction (impaired relaxation). Right Ventricle: The right ventricular size is normal. No increase in right ventricular wall thickness. Right ventricular systolic function is normal. Tricuspid regurgitation signal is inadequate for assessing PA pressure. Left Atrium: Left atrial size was normal in size. Right Atrium: Right atrial size was normal in size. Pericardium: The pericardium was not well visualized. Mitral Valve: The mitral valve was not well visualized. No evidence of mitral valve regurgitation. No evidence of mitral valve stenosis. Tricuspid Valve: The tricuspid valve is not well visualized. Tricuspid valve regurgitation is trivial. Aortic Valve: The aortic valve was not well visualized. Aortic valve regurgitation is not visualized. No aortic stenosis is present. Aortic valve mean gradient measures 6.0 mmHg. Aortic valve peak gradient measures 13.5 mmHg. Aortic valve area, by VTI measures 2.16 cm. Pulmonic Valve: The pulmonic valve was not well visualized. Pulmonic valve regurgitation is not visualized. No evidence of pulmonic stenosis. Aorta: The aortic root is normal in size and structure. Pulmonary Artery: The pulmonary artery is not well seen. Venous: The inferior vena cava was not well visualized. IAS/Shunts: The interatrial septum was not well visualized.  LEFT VENTRICLE PLAX 2D LVIDd:         4.25 cm  Diastology LVIDs:         2.95 cm  LV e' lateral:   7.29 cm/s LV PW:         1.09 cm  LV E/e' lateral: 10.0 LV IVS:        1.09 cm  LV e' medial:    7.18 cm/s LVOT diam:     1.70 cm  LV E/e' medial:  10.2 LV SV:         52 LV SV Index:   25 LVOT Area:     2.27 cm  RIGHT VENTRICLE RV S prime:     14.10 cm/s LEFT ATRIUM             Index       RIGHT ATRIUM          Index LA diam:        3.60 cm 1.76 cm/m  RA Area:     8.68 cm LA Vol (A2C):   36.9 ml 18.01 ml/m RA Volume:   14.90  ml 7.27 ml/m LA Vol (A4C):   27.1 ml 13.22 ml/m LA Biplane Vol: 31.7 ml 15.47 ml/m  AORTIC VALVE                    PULMONIC VALVE AV Area (Vmax):    2.06 cm     PV Vmax:        0.92 m/s AV Area (Vmean):   2.07 cm     PV Peak grad:   3.4 mmHg AV Area (VTI):     2.16 cm     RVOT Peak grad: 4 mmHg AV Vmax:           184.00 cm/s AV Vmean:          104.000 cm/s AV VTI:  0.238 m AV Peak Grad:      13.5 mmHg AV Mean Grad:      6.0 mmHg LVOT Vmax:         167.00 cm/s LVOT Vmean:        94.900 cm/s LVOT VTI:          0.227 m LVOT/AV VTI ratio: 0.95  AORTA Ao Root diam: 2.40 cm MITRAL VALVE MV Area (PHT): 5.13 cm     SHUNTS MV Decel Time: 148 msec     Systemic VTI:  0.23 m MV E velocity: 73.10 cm/s   Systemic Diam: 1.70 cm MV A velocity: 109.00 cm/s MV E/A ratio:  0.67 MV A Prime:    16.4 cm/s Nelva Bush MD Electronically signed by Nelva Bush MD Signature Date/Time: 02/19/2020/1:00:30 PM    Final    US PELVIC COMPLETE WITH TRANSVAGINAL  Result Date: 02/26/2020 CLINICAL DATA:  Malignant pleural effusion, abnormal PET-CT with a cystic structure in the RIGHT adnexa; postmenopausal EXAM: TRANSABDOMINAL AND TRANSVAGINAL ULTRASOUND OF PELVIS TECHNIQUE: Both transabdominal and transvaginal ultrasound examinations of the pelvis were performed. Transabdominal technique was performed for global imaging of the pelvis including uterus, ovaries, adnexal regions, and pelvic cul-de-sac. It was necessary to proceed with endovaginal exam following the transabdominal exam to visualize the uterus, endometrium, and ovaries. COMPARISON:  PET-CT 01/26/2020 FINDINGS: Uterus Measurements: 6.2 x 2.7 x 3.5 cm = volume: 30 mL. Anteverted. Heterogeneous myometrium. No focal mass Endometrium Thickness: 4 mm. Poorly defined. No definite mass or endometrial fluid Right ovary Questionable visualization of RIGHT ovary see below Left ovary Measurements: 1.9 x 0.9 x 1.1 cm = volume: 0.4 mL. Normal morphology without mass Other  findings Trace free pelvic fluid. Question cystic lesions seen on prior PET-CT is not definitely visualized. A small focus of soft tissue is identified in the RIGHT adnexa 2.0 x 1.1 x 1.0 cm, suspect representing a small RIGHT ovary. IMPRESSION: Heterogeneous myometrium with poor definition of endometrial complex. Normal LEFT ovary. No cystic RIGHT adnexal lesion is identified, though a probable small normal appearing RIGHT ovary is identified. Suspect that the low-attenuation presume cystic structure seen in the RIGHT adnexa on the prior CT corresponds to a small amount of nonspecific free fluid in the RIGHT adnexa. Electronically Signed   By: Lavonia Dana M.D.   On: 02/26/2020 17:43   US THORACENTESIS ASP PLEURAL SPACE W/IMG GUIDE  Result Date: 02/19/2020 INDICATION: Prior smoker with history of dyspnea and recurrent malignant left pleural effusion. Request made for therapeutic left thoracentesis. EXAM: ULTRASOUND GUIDED THERAPEUTIC LEFT THORACENTESIS MEDICATIONS: 1% lidocaine to skin and subcutaneous tissue COMPLICATIONS: None immediate. PROCEDURE: An ultrasound guided thoracentesis was thoroughly discussed with the patient and questions answered. The benefits, risks, alternatives and complications were also discussed. The patient understands and wishes to proceed with the procedure. Written consent was obtained. Ultrasound was performed to localize and mark an adequate pocket of fluid in the left chest. The area was then prepped and draped in the normal sterile fashion. 1% Lidocaine was used for local anesthesia. Under ultrasound guidance a 6 Fr Safe-T-Centesis catheter was introduced. Thoracentesis was performed. The catheter was removed and a dressing applied. FINDINGS: A total of approximately 1.2 liters of bloody fluid was removed. Due to patient coughing and chest discomfort only the above amount of fluid was removed today. IMPRESSION: Successful ultrasound guided therapeutic left thoracentesis yielding  1.2 liters of pleural fluid. Read by: Rowe Robert, PA-C Electronically Signed   By: Jenness Corner.D.  On: 02/19/2020 16:38   US THORACENTESIS ASP PLEURAL SPACE W/IMG GUIDE  Result Date: 02/18/2020 INDICATION: Patient with history of hyperlipidemia and elective right shoulder surgery performed on February 12, 2020. Patient presents to this facility with shortness of breath and chest pain found to have a pleural effusion. Request is for therapeutic and diagnostic left-sided thoracentesis EXAM: ULTRASOUND GUIDED LEFT THERAPEUTIC AND DIAGNOSTIC THORACENTESIS MEDICATIONS: Lidocaine 1% 10 mL. COMPLICATIONS: None immediate. Patient unable to tolerate additional fluid removal at this time. PROCEDURE: An ultrasound guided thoracentesis was thoroughly discussed with the patient and questions answered. The benefits, risks, alternatives and complications were also discussed. The patient understands and wishes to proceed with the procedure. Written consent was obtained. Ultrasound was performed to localize and mark an adequate pocket of fluid in the left chest. The area was then prepped and draped in the normal sterile fashion. 1% Lidocaine was used for local anesthesia. Under ultrasound guidance a 6 Fr Safe-T-Centesis catheter was introduced. Thoracentesis was performed. The catheter was removed and a dressing applied. FINDINGS: A total of approximately 1.8 L of serosanguineous fluid was removed. Samples were sent to the laboratory as requested by the clinical team. IMPRESSION: Successful ultrasound guided left-sided therapeutic and diagnostic thoracentesis yielding 1.8 L of pleural fluid. Read by: Rushie Nyhan, NP Electronically Signed   By: Markus Daft M.D.   On: 02/18/2020 12:38     Assessment and plan Patient is a 64 y.o. female presents for follow-up of pleural effusion 1. Malignant pleural effusion   2. Serous carcinoma of female pelvis ([place])   3. 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 She tolerates well. Labs are reviewed and discussed with patient.  Plan for total of 3 cycles of neoadjuvant chemotherapy followed by surgery.  Patient will be evaluated by gynecology oncology next week. Refer to Dietitian.  I had a discussion with patient again today and she agrees.  #Left malignant pleural effusion, status post thoracentesis in the past. Patient is asymptomatic.  Continue monitor.  # pre-existing neuropathy, grade 1, continue gabapentin  We spent sufficient time to discuss many aspect of care, questions were answered to patient's satisfaction.  Follow-up 03/25/2020 for next cycle of treatment.   Earlie Server, MD, PhD Hematology Oncology [facility] Pager- [phone] 03/11/2020

## 2020-03-12 ENCOUNTER — Other Ambulatory Visit: Payer: Self-pay | Admitting: Oncology

## 2020-03-12 DIAGNOSIS — C763 Malignant neoplasm of pelvis: Secondary | ICD-10-CM

## 2020-03-19 ENCOUNTER — Other Ambulatory Visit: Payer: Self-pay

## 2020-03-19 ENCOUNTER — Inpatient Hospital Stay: Payer: Medicare Other

## 2020-03-19 ENCOUNTER — Inpatient Hospital Stay (HOSPITAL_BASED_OUTPATIENT_CLINIC_OR_DEPARTMENT_OTHER): Payer: Medicare Other | Admitting: Obstetrics and Gynecology

## 2020-03-19 ENCOUNTER — Encounter: Payer: Self-pay | Admitting: Licensed Clinical Social Worker

## 2020-03-19 ENCOUNTER — Inpatient Hospital Stay (HOSPITAL_BASED_OUTPATIENT_CLINIC_OR_DEPARTMENT_OTHER): Payer: Medicare Other | Admitting: Licensed Clinical Social Worker

## 2020-03-19 VITALS — BP 165/88 | HR 92 | Temp 97.6°F | Wt 231.3 lb

## 2020-03-19 DIAGNOSIS — C799 Secondary malignant neoplasm of unspecified site: Secondary | ICD-10-CM | POA: Diagnosis not present

## 2020-03-19 DIAGNOSIS — Z803 Family history of malignant neoplasm of breast: Secondary | ICD-10-CM | POA: Insufficient documentation

## 2020-03-19 DIAGNOSIS — K668 Other specified disorders of peritoneum: Secondary | ICD-10-CM

## 2020-03-19 DIAGNOSIS — C763 Malignant neoplasm of pelvis: Secondary | ICD-10-CM

## 2020-03-19 LAB — FUNGUS CULTURE WITH STAIN

## 2020-03-19 LAB — FUNGUS CULTURE RESULT

## 2020-03-19 LAB — FUNGAL ORGANISM REFLEX

## 2020-03-19 NOTE — Progress Notes (Signed)
Patient denies any pain or concerns at this time.  

## 2020-03-19 NOTE — Progress Notes (Signed)
REFERRING PROVIDER: Earlie Server, MD [place],  [place] [address]  PRIMARY PROVIDER:  Burnard Hawthorne, FNP  PRIMARY REASON FOR VISIT:  1. Metastatic carcinoma ([place])   2. Family history of breast cancer   3. Mass of omentum      HISTORY OF PRESENT ILLNESS:   [name], a 64 y.o. female, was seen for a [place] cancer genetics consultation at the request of Dr. Tasia Catchings due to a personal and family history of cancer.  Linda Schmitt presents to clinic today to discuss the possibility of a hereditary predisposition to cancer, genetic testing, and to further clarify her future cancer risks, as well as potential cancer risks for family members.   In 2021, at the age of 41, Linda Schmitt was diagnosed with metastatic serous carcinoma of female pelvis. This is currently being treated with neoadjuvant chemotherapy to be followed by surgery. She is meeting with gynecology oncology today.     CANCER HISTORY:  Oncology History   No history exists.     RISK FACTORS:  Menarche was at age 63.  First live birth at age 34.  OCP use for approximately 0 years.  Ovaries intact: yes.  Hysterectomy: no.  Menopausal status: postmenopausal.  HRT use: 0 years. Colonoscopy: yes; normal. Mammogram within the last year: yes. Number of breast biopsies: 0. Any excessive radiation exposure in the past: no  Past Medical History:  Diagnosis Date  . Family history of breast cancer   . HLD (hyperlipidemia)     Past Surgical History:  Procedure Laterality Date  . CARPAL TUNNEL RELEASE Left   . SHOULDER ARTHROSCOPY WITH SUBACROMIAL DECOMPRESSION AND OPEN ROTATOR C Right 02/01/2020   Procedure: RIGHT SHOULDER ARTHROSCOPIC SUBSCAPULARIS REPAIR, ROTATOR CUFF REPAIR, SUABCROMIAL DECOMPRESSION, AND BICEPS TENODESIS.;  Surgeon: Leim Fabry, MD;  Location: [facility] ORS;  Service: Orthopedics;  Laterality: Right;  . TUBAL LIGATION      Social History   Socioeconomic History  . Marital status:  Single    Spouse name: Not on file  . Number of children: Not on file  . Years of education: Not on file  . Highest education level: Not on file  Occupational History  . Not on file  Tobacco Use  . Smoking status: Former Smoker    Packs/day: 0.75    Years: 40.00    Pack years: 30.00    Types: Cigarettes    Quit date: 02/06/2018    Years since quitting: 2.1  . Smokeless tobacco: Never Used  . Tobacco comment: quit 02/2018  Vaping Use  . Vaping Use: Never used  Substance and Sexual Activity  . Alcohol use: Yes    Comment: 2-3 glasses of wine per week  . Drug use: No  . Sexual activity: Not on file  Other Topics Concern  . Not on file  Social History Narrative   Lives in [place] alone. Work - [employer]   Diet: Regular   Exercise: walking   Social Determinants of Radio broadcast assistant Strain:   . Difficulty of Paying Living Expenses:   Food Insecurity: No Landscape architect  . Worried About Charity fundraiser in the Last Year: Never true  . Ran Out of Food in the Last Year: Never true  Transportation Needs: No Transportation Needs  . Lack of Transportation (Medical): No  . Lack of Transportation (Non-Medical): No  Physical Activity:   . Days of Exercise per Week:   . Minutes of Exercise per Session:  Stress: No Stress Concern Present  . Feeling of Stress : Not at all  Social Connections:   . Frequency of Communication with Friends and Family:   . Frequency of Social Gatherings with Friends and Family:   . Attends Religious Services:   . Active Member of Clubs or Organizations:   . Attends Archivist Meetings:   Marland Kitchen Marital Status:      FAMILY HISTORY:  We obtained a detailed, 4-generation family history.  Significant diagnoses are listed below: Family History  Problem Relation Age of Onset  . Cancer Mother        lymphoma  . Stroke Sister   . Cancer Brother        lung  . Cancer Sister        lung  . Lung cancer Brother   . Bone cancer Sister    . Breast cancer Cousin 97       maternal  . Cancer Maternal Aunt        unk types   Linda Schmitt had a son who died at 74, not cancer related. She has 3 full brothers, 1 full sister, no cancers. She has 4 paternal half brothers, 4 paternal half sisters. Two of these half-brothers had lung cancer, one half-sister had lung cancer, and one half-sister currently has bone cancer.   Linda Schmitt mother died of lymphoma in her 57s-80s. Patient had 6 maternal aunts, 3 maternal uncles. Two aunts have had cancer but she is unsure the type. A maternal cousin had breast cancer at 31, she is unsure if she had genetic testing. Maternal grandfather died at 58, grandmother died at 6, no cancers.   Linda Schmitt does not have information about her father's side of the family.  Linda Schmitt is unaware of previous family history of genetic testing for hereditary cancer risks. There is no reported Ashkenazi Jewish ancestry. There is no known consanguinity.   GENETIC COUNSELING ASSESSMENT: Linda Schmitt is a 64 y.o. female with a personal and family which is somewhat suggestive of a hereditary cancer syndrome and predisposition to cancer. We, therefore, discussed and recommended the following at today's visit.   DISCUSSION: We discussed that 5-10% of cancer in general is hereditary meaning that it is due to a mutation in a single gene that is passed down from generation to generation in a family. Approximately 15-25% of ovarian cancer is hereditary. Most cases of hereditary breast/ovarian cancer are associated with BRCA1/BRCA2 genes, although there are other genes associated with hereditary cancer as well.  We discussed that testing is beneficial for several reasons including knowing about other cancer risks, identifying potential screening and risk-reduction options that may be appropriate, knowing if certain treatment options are appropriate, and to understand if other family members could be at risk for cancer and  allow them to undergo genetic testing.   We reviewed the characteristics, features and inheritance patterns of hereditary cancer syndromes. We also discussed genetic testing, including the appropriate family members to test, the process of testing, insurance coverage and turn-around-time for results. We discussed the implications of a negative, positive and/or variant of uncertain significant result. We recommended Ms. Smethurst pursue genetic testing for the [facility] MyRisk+ [facility]Choice HRD gene panel.   The [facility] gene panel offered by [employer] includes sequencing and deletion/duplication testing of the following 35 genes: APC, ATM, AXIN2, BARD1, BMPR1A, BRCA1, BRCA2, BRIP1, CHD1, CDK4, CDKN2A, CHEK2, EPCAM (large rearrangement only), HOXB13, GALNT12, MLH1, MSH2, MSH3, MSH6, MUTYH, NBN, NTHL1, PALB2,  PMS2, PTEN, RAD51C, RAD51D, RNF43, RPS20, SMAD4, STK11, and TP53. Sequencing was performed for select regions of POLE and POLD1, and large rearrangement analysis was performed for select regions of GREM1.   Based on Ms. Zehner's personal history of cancer, she meets medical criteria for genetic testing. Despite that she meets criteria, she may still have an out of pocket cost.   PLAN:  Despite our recommendation, Ms. Mitchum did not wish to pursue genetic testing at today's visit. We understand this decision and remain available to coordinate genetic testing at any time in the future. We, therefore, recommend Ms. Haji continue to follow the cancer screening guidelines given by her primary healthcare provider.  Ms. Brabson questions were answered to her satisfaction today. Our contact information was provided should additional questions or concerns arise. Thank you for the referral and allowing [place] to share in the care of your patient.   Faith Rogue, MS, [facility] Genetic Counselor [email or web address] Phone: [phone]  The patient was seen for a total of 25 minutes in  face-to-face genetic counseling.  Drs. Magrinat, Lindi Adie and/or Burr Medico were available for discussion regarding this case.   _______________________________________________________________________ For Office Staff:  Number of people involved in session: 1 Was an Intern/ student involved with case: no

## 2020-03-19 NOTE — Progress Notes (Signed)
UPDATE: [name] decided she would like testing.  After considering the risks, benefits, and limitations, Linda Schmitt provided informed consent to pursue genetic testing and the blood sample was sent to [facility] for analysis of the [facility] panel + MyChoice HRD. Results should be available within approximately 6-8 weeks' time, at which point they will be disclosed by telephone to Linda Schmitt, as will any additional recommendations warranted by these results. Linda Schmitt will receive a summary of her genetic counseling visit and a copy of her results once available. This information will also be available in Epic.

## 2020-03-19 NOTE — Progress Notes (Signed)
Gynecologic Oncology Consult Visit   Referring Provider: Dr. Tasia Catchings  Chief Complaint: metastatic serous carcinoma  Subjective:  Linda Schmitt is a 64 y.o. female who is seen in consultation from Dr. 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  Patient presented with malignant pleural effusion two weeks after elective right shoulder surgery. She underwent thoracentesis on 7/12 and 7/13 with 1.8L and 1.2L removed respectively. Cytology was positive for PAX 8. PET scan showed evidence of peritoneal disease within the abdomen, soft tissue infiltrating into the omentum, large loculated left pleural effusion with thin FDG avid rind of soft tissue overlying the left lung. Small right pleural effusion with mild FDG uptake is equivocal for malignant effusion of the right hemithorax. Fluid density structure within right side of pelvis abutting the right sided uterus noted though indeterminate. Pelvic u/s obtained showed heterogeneous myometrium with poor definition of endometrial complex. Normal left ovary.  No cystic right adnexal lesion is identified.  No a probable small normal-appearing right ovary is identified.  Suspect that the low-attenuation presumed cystic structure seen in the right adnexa on the prior image corresponds to a small amount of nonspecific free fluid in the right adnexa.  Biopsy of omental mass on 03/05/20  DIAGNOSIS:  A. OMENTAL MASS; CT-GUIDED BIOPSY:  - HIGH-GRADE SEROUS CARCINOMA.   Given that cytology from left pleural effusion is positive for metastatic carcinoma, tumor cells are positive for PAX 8 which indicates GYN origin.  Given that patient has moderate to large amount of pleural effusion, heavy tumor burden, recommend to start chemotherapy as soon as possible with carboplatin AUC 5-6 and Taxol 175 mg/m every 3 weeks for 3 cycles.  Patient will establish care with gynecology oncology for evaluation of participating in clinical trials after neoadjuvant  chemo/surgery.  She initiated chemotherapy on 03/04/2020.  Today, she says that she tolerated first cycle well without significant side effects.  Shortness of breath is somewhat improved.  Bowel movements regular.  No vaginal bleeding or pain.  Has occasional sharp mid-pelvic pain.  Denies new leg swelling.  No nausea or vomiting.  No fevers or chills.  Otherwise feels at baseline.  She met with genetic counselor earlier today and germline and somatic testing were recommended.  Patient reluctant.   Genetic testing: pending. Somatic testing: pending.   Problem List: Patient Active Problem List   Diagnosis Date Noted  . Serous carcinoma of female pelvis ([place]) 03/20/2020  . Family history of breast cancer   . Encounter for antineoplastic chemotherapy 03/04/2020  . Neuropathy 03/04/2020  . Mass of omentum 02/28/2020  . Goals of care, counseling/discussion 02/26/2020  . Metastatic carcinoma ([place]) 02/26/2020  . Status post thoracentesis   . Malignant pleural effusion   . Pleural effusion 02/28/2020  . Mass of arm, right 01/22/2020  . Elevated blood pressure reading 12/28/2019  . Hand tingling 12/28/2019  . Acute pain of right shoulder 12/01/2018  . Adrenal adenoma, left 11/11/2017  . Atherosclerosis of aorta ([place]) 09/23/2017  . Prediabetes 07/09/2017  . Tendonitis of wrist, right 07/06/2017  . Hyperlipidemia 10/11/2016  . Former smoker 09/21/2016  . Depression, recurrent ([place]) 09/14/2016  . Headache 09/14/2016  . Routine general medical examination at a health care facility 01/14/2015  . Screening for breast cancer 11/12/2013    Past Medical History: Past Medical History:  Diagnosis Date  . Family history of breast cancer   . HLD (hyperlipidemia)     Past Surgical History: Past Surgical History:  Procedure Laterality Date  .  CARPAL TUNNEL RELEASE Left   . SHOULDER ARTHROSCOPY WITH SUBACROMIAL DECOMPRESSION AND OPEN ROTATOR C Right 02/01/2020   Procedure: RIGHT SHOULDER  ARTHROSCOPIC SUBSCAPULARIS REPAIR, ROTATOR CUFF REPAIR, SUABCROMIAL DECOMPRESSION, AND BICEPS TENODESIS.;  Surgeon: Leim Fabry, MD;  Location: [facility] ORS;  Service: Orthopedics;  Laterality: Right;  . TUBAL LIGATION      Past Gynecologic History:    OB History:  OB History  No obstetric history on file.    Family History: Family History  Problem Relation Age of Onset  . Cancer Mother        lymphoma  . Stroke Sister   . Cancer Brother        lung  . Cancer Sister        lung  . Lung cancer Brother   . Bone cancer Sister   . Breast cancer Cousin 60       maternal  . Cancer Maternal Aunt        unk types  Family history significant for lung cancer, lymphoma, and breast cancer.   Social History: Social History   Socioeconomic History  . Marital status: Single    Spouse name: Not on file  . Number of children: Not on file  . Years of education: Not on file  . Highest education level: Not on file  Occupational History  . Not on file  Tobacco Use  . Smoking status: Former Smoker    Packs/day: 0.75    Years: 40.00    Pack years: 30.00    Types: Cigarettes    Quit date: 02/06/2018    Years since quitting: 2.1  . Smokeless tobacco: Never Used  . Tobacco comment: quit 02/2018  Vaping Use  . Vaping Use: Never used  Substance and Sexual Activity  . Alcohol use: Yes    Comment: 2-3 glasses of wine per week  . Drug use: No  . Sexual activity: Not on file  Other Topics Concern  . Not on file  Social History Narrative   Lives in [place] alone. Work - [employer]   Diet: Regular   Exercise: walking   Social Determinants of Radio broadcast assistant Strain:   . Difficulty of Paying Living Expenses:   Food Insecurity: No Landscape architect  . Worried About Charity fundraiser in the Last Year: Never true  . Ran Out of Food in the Last Year: Never true  Transportation Needs: No Transportation Needs  . Lack of Transportation (Medical): No  . Lack of Transportation  (Non-Medical): No  Physical Activity:   . Days of Exercise per Week:   . Minutes of Exercise per Session:   Stress: No Stress Concern Present  . Feeling of Stress : Not at all  Social Connections:   . Frequency of Communication with Friends and Family:   . Frequency of Social Gatherings with Friends and Family:   . Attends Religious Services:   . Active Member of Clubs or Organizations:   . Attends Archivist Meetings:   Marland Kitchen Marital Status:   Intimate Partner Violence: Not At Risk  . Fear of Current or Ex-Partner: No  . Emotionally Abused: No  . Physically Abused: No  . Sexually Abused: No    Allergies: No Known Allergies  Current Medications: Current Outpatient Medications  Medication Sig Dispense Refill  . acetaminophen (TYLENOL) 500 MG tablet Take 2 tablets (1,000 mg total) by mouth every 8 (eight) hours. 90 tablet 2  . cholecalciferol (QC VITAMIN D3) 10 MCG (  400 UNIT) TABS tablet Take 800 Units by mouth daily.    . furosemide (LASIX) 40 MG tablet Take 40 mg by mouth daily.    Marland Kitchen gabapentin (NEURONTIN) 100 MG capsule Take 1 capsule (100 mg total) by mouth 3 (three) times daily. (Patient taking differently: Take 100 mg by mouth at bedtime. Taking 1 QD) 90 capsule 3  . lidocaine-prilocaine (EMLA) cream Apply to affected area once 30 g 3  . ondansetron (ZOFRAN) 8 MG tablet Take 1 tablet (8 mg total) by mouth 2 (two) times daily as needed for refractory nausea / vomiting. Start on day 3 after carboplatin chemo. 30 tablet 1  . oxyCODONE (ROXICODONE) 5 MG immediate release tablet Take 1-2 tablets (5-10 mg total) by mouth every 4 (four) hours as needed (pain). 30 tablet 0  . pravastatin (PRAVACHOL) 40 MG tablet TAKE 1 TABLET(40 MG) BY MOUTH DAILY (Patient taking differently: Take 40 mg by mouth at bedtime. ) 90 tablet 0  . prochlorperazine (COMPAZINE) 10 MG tablet Take 1 tablet (10 mg total) by mouth every 6 (six) hours as needed (Nausea or vomiting). 30 tablet 1   No current  facility-administered medications for this visit.    General: negative for fevers, changes in weight or night sweats Skin: negative for changes in moles or sores or rash Eyes: negative for changes in vision HEENT: negative for change in hearing, tinnitus, voice changes Pulmonary: negative for dyspnea, orthopnea, productive cough, wheezing Cardiac: negative for palpitations, pain Gastrointestinal: negative for nausea, vomiting, constipation, diarrhea, hematemesis, hematochezia Genitourinary/Sexual: negative for dysuria, retention, hematuria, incontinence Ob/Gyn:  negative for abnormal bleeding, or pain Musculoskeletal: negative for pain, joint pain, back pain Hematology: negative for easy bruising, abnormal bleeding Neurologic/Psych: negative for headaches, seizures, paralysis, weakness, numbness   Objective:  Physical Examination:  BP (!) 165/88 (BP Location: Left Arm, Patient Position: Sitting, Cuff Size: Normal)   Pulse 92   Temp 97.6 F (36.4 C) (Tympanic)   Wt 231 lb 4.8 oz (104.9 kg)   SpO2 100%   BMI 40.97 kg/m     ECOG Performance Status: 1 - Symptomatic but completely ambulatory  GENERAL: Patient is a well appearing female in no acute distress HEENT:  PERRL, neck supple with midline trachea. Thyroid without masses.  NODES:  No cervical, supraclavicular, axillary, or inguinal lymphadenopathy palpated.  LUNGS:  Clear to auscultation bilaterally.  No wheezes or rhonchi. HEART:  Regular rate and rhythm. No murmur appreciated. ABDOMEN:  Soft, nontender.  Positive, normoactive bowel sounds.  MSK:  No focal spinal tenderness to palpation. Full range of motion bilaterally in the upper extremities. EXTREMITIES:  Slight peripheral edema SKIN:  Clear with no obvious rashes or skin changes. No nail dyscrasia. NEURO:  Nonfocal. Well oriented.  Appropriate affect.  Pelvic: Exam chaperoned by NP. EGBUS: no lesions, Cervix: no lesions, nontender, mobile, Vagina: no lesions, no  bleeding, thin white discharge, nonmalodorous. Uterus: normal size, nontender, mobile Adnexa: no palpable masses, Rectovaginal: confirmatory  Lab Review Labs on site today: Genetic testing performed today  Radiologic Imaging: Relevant imaging including PET scan, pelvic ultrasound were independently reviewed by Dr. Theora Gianotti who agrees with findings.    Assessment:  SHATERA RENNERT is a 64 y.o. female diagnosed with metastatic high grade serous cancer of [place] origin s/p initial cycle of therapy with paclitaxel/carboplatin.   Medical co-morbidities complicating care: Body mass index is 40.97 kg/m. Plan:   Problem List Items Addressed This Visit      Other   Serous carcinoma of  female pelvis ([place]) - Primary     Recommend 3 cycles of neoadjuvant carbo-Taxol chemotherapy.  Recommend CT scan chest abdomen pelvis for restaging after her third cycle and then we will plan to see her a week after her third cycle for discussion of imaging and discussion of interval debulking surgery.  She will likely receive fourth cycle of chemotherapy prior to surgery but will depend on response to chemotherapy and OR timing.  Recommendations were discussed with Dr. Tasia Catchings who will order CT scans.  Ms. Welliver initially was not interested in Genetic counseling, germline testing or somatic testing. But when we discussed the importance of these tests in determining her prognosis and benefit of maintenance therapy she was willing to consider. She is now agreeable to have blood drawn today for genetic testing. We will also request somatic testing on her tumor biopsy.   The patient's diagnosis, an outline of the further diagnostic and laboratory studies which will be required, the recommendation for surgery, and alternatives were discussed with her and her accompanying family members.  All questions were answered to their satisfaction.  I personally had a face to face interaction and evaluated the patient jointly  with the NP, Ms. Beckey Rutter.  I have reviewed her history and available records and have performed the key portions of the physical exam including lymph node survey, abdominal exam, pelvic exam with my findings confirming those documented above by the APP.  I have discussed the case with the APP and the patient.  I agree with the above documentation, assessment and plan which was fully formulated by me.  Counseling was completed by me.   I personally saw the patient and performed a substantive portion of this encounter in conjunction with the listed APP as documented above.  Angeles Gaetana Michaelis, MD  .

## 2020-03-20 DIAGNOSIS — C763 Malignant neoplasm of pelvis: Secondary | ICD-10-CM | POA: Insufficient documentation

## 2020-03-24 ENCOUNTER — Other Ambulatory Visit: Payer: Self-pay | Admitting: Family

## 2020-03-24 DIAGNOSIS — E785 Hyperlipidemia, unspecified: Secondary | ICD-10-CM

## 2020-03-25 ENCOUNTER — Inpatient Hospital Stay: Payer: Medicare Other

## 2020-03-25 ENCOUNTER — Other Ambulatory Visit: Payer: Self-pay

## 2020-03-25 ENCOUNTER — Encounter: Payer: Self-pay | Admitting: Oncology

## 2020-03-25 ENCOUNTER — Inpatient Hospital Stay (HOSPITAL_BASED_OUTPATIENT_CLINIC_OR_DEPARTMENT_OTHER): Payer: Medicare Other | Admitting: Oncology

## 2020-03-25 ENCOUNTER — Inpatient Hospital Stay: Payer: Medicare Other | Admitting: Licensed Clinical Social Worker

## 2020-03-25 VITALS — BP 142/97 | HR 98 | Temp 97.6°F | Resp 20 | Wt 231.1 lb

## 2020-03-25 DIAGNOSIS — Z5111 Encounter for antineoplastic chemotherapy: Secondary | ICD-10-CM | POA: Diagnosis not present

## 2020-03-25 DIAGNOSIS — J910 Malignant pleural effusion: Secondary | ICD-10-CM | POA: Diagnosis not present

## 2020-03-25 DIAGNOSIS — C763 Malignant neoplasm of pelvis: Secondary | ICD-10-CM | POA: Diagnosis not present

## 2020-03-25 DIAGNOSIS — Z7189 Other specified counseling: Secondary | ICD-10-CM

## 2020-03-25 LAB — COMPREHENSIVE METABOLIC PANEL
ALT: 14 U/L (ref 0–44)
AST: 15 U/L (ref 15–41)
Albumin: 3.9 g/dL (ref 3.5–5.0)
Alkaline Phosphatase: 48 U/L (ref 38–126)
Anion gap: 8 (ref 5–15)
BUN: 15 mg/dL (ref 8–23)
CO2: 26 mmol/L (ref 22–32)
Calcium: 8.9 mg/dL (ref 8.9–10.3)
Chloride: 106 mmol/L (ref 98–111)
Creatinine, Ser: 0.82 mg/dL (ref 0.44–1.00)
GFR calc Af Amer: >60 mL/min (ref >60)
GFR calc non Af Amer: >60 mL/min (ref >60)
Glucose, Bld: 139 mg/dL — ABNORMAL HIGH (ref 70–99)
Potassium: 3.7 mmol/L (ref 3.5–5.1)
Sodium: 140 mmol/L (ref 135–145)
Total Bilirubin: 0.4 mg/dL (ref 0.3–1.2)
Total Protein: 7 g/dL (ref 6.5–8.1)

## 2020-03-25 LAB — CBC WITH DIFFERENTIAL/PLATELET
Abs Immature Granulocytes: 0.06 10*3/uL (ref 0.00–0.07)
Basophils Absolute: 0 10*3/uL (ref 0.0–0.1)
Basophils Relative: 1 %
Eosinophils Absolute: 0.1 10*3/uL (ref 0.0–0.5)
Eosinophils Relative: 2 %
HCT: 35.7 % — ABNORMAL LOW (ref 36.0–46.0)
Hemoglobin: 11.5 g/dL — ABNORMAL LOW (ref 12.0–15.0)
Immature Granulocytes: 1 %
Lymphocytes Relative: 26 %
Lymphs Abs: 2 10*3/uL (ref 0.7–4.0)
MCH: 28.5 pg (ref 26.0–34.0)
MCHC: 32.2 g/dL (ref 30.0–36.0)
MCV: 88.6 fL (ref 80.0–100.0)
Monocytes Absolute: 0.9 10*3/uL (ref 0.1–1.0)
Monocytes Relative: 12 %
Neutro Abs: 4.6 10*3/uL (ref 1.7–7.7)
Neutrophils Relative %: 58 %
Platelets: 184 10*3/uL (ref 150–400)
RBC: 4.03 MIL/uL (ref 3.87–5.11)
RDW: 14.1 % (ref 11.5–15.5)
WBC: 7.7 10*3/uL (ref 4.0–10.5)
nRBC: 0 % (ref 0.0–0.2)

## 2020-03-25 MED ORDER — SODIUM CHLORIDE 0.9 % IV SOLN
588.5000 mg | Freq: Once | INTRAVENOUS | Status: AC
  Administered 2020-03-25: 590 mg via INTRAVENOUS
  Filled 2020-03-25: qty 59

## 2020-03-25 MED ORDER — SODIUM CHLORIDE 0.9 % IV SOLN
175.0000 mg/m2 | Freq: Once | INTRAVENOUS | Status: AC
  Administered 2020-03-25: 390 mg via INTRAVENOUS
  Filled 2020-03-25: qty 65

## 2020-03-25 MED ORDER — DIPHENHYDRAMINE HCL 50 MG/ML IJ SOLN
50.0000 mg | Freq: Once | INTRAMUSCULAR | Status: AC
  Administered 2020-03-25: 50 mg via INTRAVENOUS
  Filled 2020-03-25: qty 1

## 2020-03-25 MED ORDER — SODIUM CHLORIDE 0.9 % IV SOLN
10.0000 mg | Freq: Once | INTRAVENOUS | Status: AC
  Administered 2020-03-25: 10 mg via INTRAVENOUS
  Filled 2020-03-25: qty 10

## 2020-03-25 MED ORDER — SODIUM CHLORIDE 0.9 % IV SOLN
Freq: Once | INTRAVENOUS | Status: AC
  Filled 2020-03-25: qty 250

## 2020-03-25 MED ORDER — SODIUM CHLORIDE 0.9 % IV SOLN
150.0000 mg | Freq: Once | INTRAVENOUS | Status: AC
  Administered 2020-03-25: 150 mg via INTRAVENOUS
  Filled 2020-03-25: qty 150

## 2020-03-25 MED ORDER — PALONOSETRON HCL INJECTION 0.25 MG/5ML
0.2500 mg | Freq: Once | INTRAVENOUS | Status: AC
  Administered 2020-03-25: 0.25 mg via INTRAVENOUS
  Filled 2020-03-25: qty 5

## 2020-03-25 MED ORDER — FAMOTIDINE IN NACL 20-0.9 MG/50ML-% IV SOLN
20.0000 mg | Freq: Once | INTRAVENOUS | Status: AC
  Administered 2020-03-25: 20 mg via INTRAVENOUS
  Filled 2020-03-25: qty 50

## 2020-03-25 NOTE — Progress Notes (Signed)
Hematology/Oncology Follow Up Note [facility]  [phone] [phone]  Patient Care Team: Burnard Hawthorne, FNP as PCP - General (Family Medicine) Clent Jacks, RN as Oncology Nurse Navigator   Name of the patient: Linda Schmitt  [id number]  1956-07-31   REASON FOR VISIT  follow-up for serous carcinoma  PERTINENT ONCOLOGY HISTORY TESSA SEABERRY is a 64 y.o.afemale who has above oncology history reviewed by me today presented for follow up visit for management of malignant pleural effusion status post thoracentesis x2 during her recent admission. Cytology was positive for metastatic carcinoma.  TTF-1 Napsin A, GATA3, CDX2 were negative. Positive for PAX 8, which is most often expressing tumors of gynecological and renal origin. PET scan was independently reviewed by me and discussed with patient.  I also discussed with radiology. Exam showed evidence of peritoneal disease within the abdomen, soft tissue infiltrating into the omentum.  Large loculated left pleural effusion with thin FDG avid rind of soft tissue overlying the left lung.  Small right pleural effusion with mild FDG uptake is equivocal for malignant effusion of the right hemithorax. Fluid density structure within the right side of the pelvis abutting the right-sided of uterus is noted.  This is indeterminate.  Further investigation with pelvic sonogram may be helpful.  Pelvic ultrasound was obtained.-Heterogeneous myometrium with poor definition of endometrial complex. Normal left ovary.  No cystic right adnexal lesion is identified.  No a probable small normal-appearing right ovary is identified.  Suspect that the low-attenuation presumed cystic structure seen in the right adnexa on the prior image corresponds to a small amount of nonspecific free fluid in the right adnexa. I discussed with gynecology oncology Dr.Secord.  Given that cytology from left pleural effusion is  positive for metastatic carcinoma, tumor cells are positive for PAX 8 which indicates GYN origin.  Given that patient has moderate to large amount of pleural effusion, heavy tumor burden, recommend to start chemotherapy as soon as possible with carboplatin AUC 5-6 and Taxol 175 mg/m every 3 weeks for 3 cycles.  Patient will establish care with gynecology oncology for evaluation of participating in clinical trials after neoadjuvant chemo/surgery.  #Pre-existing neuropathy of left hand, she takes gabapentin.  INTERVAL HISTORY 64 year old female presents for follow-up of pleural effusion, metastatic serous carcinoma Patient is status post 1 cycle of carboplatin and Taxol treatment. Overall she tolerates well.  She denies any worsening of her breathing condition.  Denies any pain.  Patient was seen by GYN oncology last week. Review of Systems  Constitutional: Negative for appetite change, chills, fatigue and fever.  HENT:   Negative for hearing loss and voice change.   Eyes: Negative for eye problems.  Respiratory: Negative for chest tightness and cough.   Cardiovascular: Negative for chest pain.  Gastrointestinal: Negative for abdominal distention, abdominal pain and blood in stool.  Endocrine: Negative for hot flashes.  Genitourinary: Negative for difficulty urinating and frequency.   Musculoskeletal: Negative for arthralgias.  Skin: Negative for itching and rash.  Neurological: Positive for numbness. Negative for extremity weakness.       Pre-existing neuropathy of left hand.  Hematological: Negative for adenopathy.  Psychiatric/Behavioral: Negative for confusion.      No Known Allergies   Past Medical History:  Diagnosis Date  . Family history of breast cancer   . HLD (hyperlipidemia)      Past Surgical History:  Procedure Laterality Date  . CARPAL TUNNEL RELEASE Left   . SHOULDER ARTHROSCOPY  WITH SUBACROMIAL DECOMPRESSION AND OPEN ROTATOR C Right 02/01/2020   Procedure:  RIGHT SHOULDER ARTHROSCOPIC SUBSCAPULARIS REPAIR, ROTATOR CUFF REPAIR, SUABCROMIAL DECOMPRESSION, AND BICEPS TENODESIS.;  Surgeon: Leim Fabry, MD;  Location: [facility] ORS;  Service: Orthopedics;  Laterality: Right;  . TUBAL LIGATION      Social History   Socioeconomic History  . Marital status: Single    Spouse name: Not on file  . Number of children: Not on file  . Years of education: Not on file  . Highest education level: Not on file  Occupational History  . Not on file  Tobacco Use  . Smoking status: Former Smoker    Packs/day: 0.75    Years: 40.00    Pack years: 30.00    Types: Cigarettes    Quit date: 02/06/2018    Years since quitting: 2.1  . Smokeless tobacco: Never Used  . Tobacco comment: quit 02/2018  Vaping Use  . Vaping Use: Never used  Substance and Sexual Activity  . Alcohol use: Yes    Comment: 2-3 glasses of wine per week  . Drug use: No  . Sexual activity: Not on file  Other Topics Concern  . Not on file  Social History Narrative   Lives in [place] alone. Work - [employer]   Diet: Regular   Exercise: walking   Social Determinants of Radio broadcast assistant Strain:   . Difficulty of Paying Living Expenses:   Food Insecurity: No Landscape architect  . Worried About Charity fundraiser in the Last Year: Never true  . Ran Out of Food in the Last Year: Never true  Transportation Needs: No Transportation Needs  . Lack of Transportation (Medical): No  . Lack of Transportation (Non-Medical): No  Physical Activity:   . Days of Exercise per Week:   . Minutes of Exercise per Session:   Stress: No Stress Concern Present  . Feeling of Stress : Not at all  Social Connections:   . Frequency of Communication with Friends and Family:   . Frequency of Social Gatherings with Friends and Family:   . Attends Religious Services:   . Active Member of Clubs or Organizations:   . Attends Archivist Meetings:   Marland Kitchen Marital Status:   Intimate Partner Violence:  Not At Risk  . Fear of Current or Ex-Partner: No  . Emotionally Abused: No  . Physically Abused: No  . Sexually Abused: No    Family History  Problem Relation Age of Onset  . Cancer Mother        lymphoma  . Stroke Sister   . Cancer Brother        lung  . Cancer Sister        lung  . Lung cancer Brother   . Bone cancer Sister   . Breast cancer Cousin 55       maternal  . Cancer Maternal Aunt        unk types     Current Outpatient Medications:  .  acetaminophen (TYLENOL) 500 MG tablet, Take 2 tablets (1,000 mg total) by mouth every 8 (eight) hours., Disp: 90 tablet, Rfl: 2 .  cholecalciferol (QC VITAMIN D3) 10 MCG (400 UNIT) TABS tablet, Take 800 Units by mouth daily., Disp: , Rfl:  .  furosemide (LASIX) 40 MG tablet, Take 40 mg by mouth daily., Disp: , Rfl:  .  gabapentin (NEURONTIN) 100 MG capsule, Take 1 capsule (100 mg total) by mouth 3 (three) times daily. (Patient  taking differently: Take 100 mg by mouth at bedtime. Taking 1 QD), Disp: 90 capsule, Rfl: 3 .  lidocaine-prilocaine (EMLA) cream, Apply to affected area once, Disp: 30 g, Rfl: 3 .  ondansetron (ZOFRAN) 8 MG tablet, Take 1 tablet (8 mg total) by mouth 2 (two) times daily as needed for refractory nausea / vomiting. Start on day 3 after carboplatin chemo., Disp: 30 tablet, Rfl: 1 .  oxyCODONE (ROXICODONE) 5 MG immediate release tablet, Take 1-2 tablets (5-10 mg total) by mouth every 4 (four) hours as needed (pain)., Disp: 30 tablet, Rfl: 0 .  pravastatin (PRAVACHOL) 40 MG tablet, TAKE 1 TABLET(40 MG) BY MOUTH DAILY, Disp: 90 tablet, Rfl: 0 .  prochlorperazine (COMPAZINE) 10 MG tablet, Take 1 tablet (10 mg total) by mouth every 6 (six) hours as needed (Nausea or vomiting)., Disp: 30 tablet, Rfl: 1  Physical exam:  Vitals:   03/25/20 0853  BP: (!) 142/97  Pulse: 98  Resp: 20  Temp: 97.6 F (36.4 C)  SpO2: 100%  Weight: 231 lb 1.6 oz (104.8 kg)   Physical Exam Constitutional:      General: She is not in  acute distress. HENT:     Head: Normocephalic and atraumatic.  Eyes:     General: No scleral icterus. Cardiovascular:     Rate and Rhythm: Normal rate and regular rhythm.     Heart sounds: Normal heart sounds.  Pulmonary:     Effort: Pulmonary effort is normal. No respiratory distress.     Breath sounds: No wheezing.     Comments: Diminished breathing sounds.  left lower 1/3  Abdominal:     General: Bowel sounds are normal. There is no distension.     Palpations: Abdomen is soft.  Musculoskeletal:        General: No deformity. Normal range of motion.     Cervical back: Normal range of motion and neck supple.  Skin:    General: Skin is warm and dry.     Findings: No erythema or rash.  Neurological:     Mental Status: She is alert and oriented to person, place, and time. Mental status is at baseline.     Cranial Nerves: No cranial nerve deficit.     Coordination: Coordination normal.  Psychiatric:        Mood and Affect: Mood normal.     CMP Latest Ref Rng & Units 03/11/2020  Glucose 70 - 99 mg/dL 123(H)  BUN 8 - 23 mg/dL 11  Creatinine 0.44 - 1.00 mg/dL 0.83  Sodium 135 - 145 mmol/L 137  Potassium 3.5 - 5.1 mmol/L 3.9  Chloride 98 - 111 mmol/L 101  CO2 22 - 32 mmol/L 26  Calcium 8.9 - 10.3 mg/dL 8.9  Total Protein 6.5 - 8.1 g/dL 7.2  Total Bilirubin 0.3 - 1.2 mg/dL 0.7  Alkaline Phos 38 - 126 U/L 43  AST 15 - 41 U/L 20  ALT 0 - 44 U/L 18   CBC Latest Ref Rng & Units 03/11/2020  WBC 4.0 - 10.5 K/uL 3.7(L)  Hemoglobin 12.0 - 15.0 g/dL 11.9(L)  Hematocrit 36 - 46 % 36.5  Platelets 150 - 400 K/uL 209    RADIOGRAPHIC STUDIES: I have personally reviewed the radiological images as listed and agreed with the findings in the report. MR Brain W Wo Contrast  Result Date: 02/27/2020 CLINICAL DATA:  Metastatic carcinoma. Malignant pleural effusion. Intraperitoneal tumor. Staging for intracranial disease. EXAM: MRI HEAD WITHOUT AND WITH CONTRAST TECHNIQUE: Multiplanar, multiecho  pulse sequences of the brain and surrounding structures were obtained without and with intravenous contrast. CONTRAST:  68m GADAVIST GADOBUTROL 1 MMOL/ML IV SOLN COMPARISON:  None. FINDINGS: Brain: Diffusion imaging does not show any acute or subacute infarction. Brainstem and cerebellum are normal. There is a single old white matter infarction in the right posterior frontal white matter. No cortical or large vessel territory infarction. After contrast administration, no abnormal enhancement occurs. No evidence of primary or metastatic mass lesion. No leptomeningeal disease. Vascular: Major vessels at the base of the brain show flow. Skull and upper cervical spine: Negative Sinuses/Orbits: Clear/normal Other: None IMPRESSION: No evidence of regional metastatic disease. Normal study with exception of an old white matter infarction in the right posterior frontal region. Electronically Signed   By: MNelson ChimesM.D.   On: 02/27/2020 19:00   NM PET Image Initial (PI) Skull Base To Thigh  Result Date: 02/26/2020 CLINICAL DATA:  Initial treatment strategy for malignant pleural effusion. EXAM: NUCLEAR MEDICINE PET SKULL BASE TO THIGH TECHNIQUE: 12.83 mCi F-18 FDG was injected intravenously. Full-ring PET imaging was performed from the skull base to thigh after the radiotracer. CT data was obtained and used for attenuation correction and anatomic localization. Fasting blood glucose: 91 mg/dl COMPARISON:  None. FINDINGS: Mediastinal blood pool activity: SUV max 3.16 Liver activity: SUV max NA NECK: There is symmetric radiotracer uptake noted within the base of tongue and bilateral tonsillar regions with SUV max of 7.49. No FDG avid lymph nodes identified within the soft tissues of the neck. Incidental CT findings: none CHEST: No FDG avid axillary or supraclavicular lymph nodes. No FDG avid mediastinal or hilar lymph nodes. Known malignant, loculated left pleural effusion is noted with overlying compressive type  atelectasis. This has decreased in volume when compared with pre thoracentesis chest CT dated 02/17/2020. There is a thin rind of mildly FDG avid soft tissue overlying the left lung compatible with malignant pleural effusion. The corresponding SUV max ranges between 3.25 and 2.83. There is a trace amount of right pleural fluid which also exhibits mild FDG uptake within SUV max of 3.15, image 93/3. No suspicious or FDG avid pulmonary nodule or mass identified. Incidental CT findings: Mild aortic atherosclerosis. ABDOMEN/PELVIS: There is no abnormal FDG uptake within the liver, pancreas, spleen, or adrenal glands. Left adrenal nodule measures 1.4 cm and does not exhibit any significant tracer uptake favoring a benign adenoma. No enlarged or FDG avid retroperitoneal or pelvic lymph nodes. FDG avid soft tissue infiltration of the omentum is identified compatible with peritoneal carcinomatosis. Focal area of increased omental soft tissue along the midline of the abdomen measures 4.7 x 2.2 cm and has an SUV max of 7.27. Additional small peritoneal nodules are noted scattered within the upper abdomen including on image 169/3 and image 153/3. No ascites identified. Within the right pelvis abutting the right side of the uterus is a fluid density structure which measures 4.3 x 2.7 cm within SUV max of 4.20. Incidental CT findings: Aortic atherosclerosis. SKELETON: No focal hypermetabolic activity to suggest skeletal metastasis. Incidental CT findings: none IMPRESSION: 1. Exam shows evidence of peritoneal disease within the abdomen including FDG avid soft tissue infiltration into the omentum. 2. Large, loculated left pleural effusion with thin, FDG avid rind of soft tissue overlying the left lung. This corresponds to known, pathology proven malignant pleural effusion. 3. Small right pleural effusion with mild FDG uptake is equivocal for malignant effusion of the right hemithorax. 4. Fluid density structure within right side  of  pelvis abutting the right-side of the uterus is noted. This is indeterminate. Further investigation with pelvic sonogram may be helpful. Electronically Signed   By: Kerby Moors M.D.   On: 02/26/2020 15:36   CT Biopsy  Result Date: 03/05/2020 INDICATION: 63 year old female with a history of FDG avid omental mass EXAM: CT BIOPSY MEDICATIONS: None. ANESTHESIA/SEDATION: Moderate (conscious) sedation was employed during this procedure. A total of Versed 2.0 mg and Fentanyl 100 mcg was administered intravenously. Moderate Sedation Time: 15 minutes. The patient's level of consciousness and vital signs were monitored continuously by radiology nursing throughout the procedure under my direct supervision. FLUOROSCOPY TIME:  CT COMPLICATIONS: None PROCEDURE: Informed written consent was obtained from the patient after a thorough discussion of the procedural risks, benefits and alternatives. All questions were addressed. Maximal Sterile Barrier Technique was utilized including caps, mask, sterile gowns, sterile gloves, sterile drape, hand hygiene and skin antiseptic. A timeout was performed prior to the initiation of the procedure. Patient positioned supine position on the CT gantry table. Scout CT was acquired for planning purposes. Using CT guidance, 18 gauge needle was advanced into the omental mass from a tangential approach, with a safe, greater than 3 cm margin to the adjacent bowel. Multiple 18 gauge core biopsy were performed. Two separate devices were required given failure of the first device. Specimens placed into formalin. Needle was removed and a final CT was acquired. Patient tolerated the procedure well and remained hemodynamically stable throughout. No complications were encountered and no significant blood loss. IMPRESSION: Status post CT-guided omental mass biopsy. Signed, Dulcy Fanny. Dellia Nims, RPVI [facility] Electronically Signed   By: Corrie Mckusick D.O.   On: 03/05/2020 11:34   US PELVIC COMPLETE WITH TRANSVAGINAL  Result Date: 02/26/2020 CLINICAL DATA:  Malignant pleural effusion, abnormal PET-CT with a cystic structure in the RIGHT adnexa; postmenopausal EXAM: TRANSABDOMINAL AND TRANSVAGINAL ULTRASOUND OF PELVIS TECHNIQUE: Both transabdominal and transvaginal ultrasound examinations of the pelvis were performed. Transabdominal technique was performed for global imaging of the pelvis including uterus, ovaries, adnexal regions, and pelvic cul-de-sac. It was necessary to proceed with endovaginal exam following the transabdominal exam to visualize the uterus, endometrium, and ovaries. COMPARISON:  PET-CT 01/26/2020 FINDINGS: Uterus Measurements: 6.2 x 2.7 x 3.5 cm = volume: 30 mL. Anteverted. Heterogeneous myometrium. No focal mass Endometrium Thickness: 4 mm. Poorly defined. No definite mass or endometrial fluid Right ovary Questionable visualization of RIGHT ovary see below Left ovary Measurements: 1.9 x 0.9 x 1.1 cm = volume: 0.4 mL. Normal morphology without mass Other findings Trace free pelvic fluid. Question cystic lesions seen on prior PET-CT is not definitely visualized. A small focus of soft tissue is identified in the RIGHT adnexa 2.0 x 1.1 x 1.0 cm, suspect representing a small RIGHT ovary. IMPRESSION: Heterogeneous myometrium with poor definition of endometrial complex. Normal LEFT ovary. No cystic RIGHT adnexal lesion is identified, though a probable small normal appearing RIGHT ovary is identified. Suspect that the low-attenuation presume cystic structure seen in the RIGHT adnexa on the prior CT corresponds to a small amount of nonspecific free fluid in the RIGHT adnexa. Electronically Signed   By: Lavonia Dana M.D.   On: 02/26/2020 17:43     Assessment and plan Patient is a 64 y.o. female presents for follow-up of pleural effusion 1. Serous carcinoma of female pelvis ([place])   2. Malignant pleural effusion   3. Encounter for  antineoplastic chemotherapy   4. 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 Labs are reviewed and discussed with patient. Proceed with cycle 2 Carboplatin and taxol.  She has seen genetic counselor- [facility] my choice HRD gene panel was sent.   #Left malignant pleural effusion, status post thoracentesis in the past. Patient is asymptomatic. Monitor.   # pre-existing neuropathy, grade 1, continue gabapentin  We spent sufficient time to discuss many aspect of care, questions were answered to patient's satisfaction.  Follow-up 3 weeks for next cycle of treatment.   Earlie Server, MD, PhD Hematology Oncology [facility] Pager- [phone] 03/25/2020

## 2020-03-25 NOTE — Progress Notes (Signed)
Pt tolerated infusion well. No s/s of distress or reaction noted. Pt stable at discharge.  

## 2020-03-26 LAB — CA 125: Cancer Antigen (CA) 125: 39.9 U/mL — ABNORMAL HIGH (ref 0.0–38.1)

## 2020-04-02 ENCOUNTER — Telehealth: Payer: Self-pay | Admitting: Obstetrics and Gynecology

## 2020-04-02 NOTE — Telephone Encounter (Signed)
I discussed with Linda Schmitt. [name] test demonstrates GIS positive status and BRCA1 mutation status. Germline testing pending. Linda Schmitt will contact Linda Schmitt and let her know.  Linda Kretz Gaetana Michaelis, MD

## 2020-04-03 ENCOUNTER — Encounter: Payer: Self-pay | Admitting: Oncology

## 2020-04-04 ENCOUNTER — Telehealth: Payer: Self-pay

## 2020-04-04 ENCOUNTER — Encounter: Payer: Self-pay | Admitting: Oncology

## 2020-04-04 NOTE — Telephone Encounter (Signed)
    MA8/27/2021 1st Attempt  Name: Linda Schmitt   MRN: [id number]   DOB: 18-Jul-1956   AGE: 64 y.o.   GENDER: female   PCP Burnard Hawthorne, FNP.   04/04/20 Spoke with patient she is not interested in transportation assistance at this time.  Her cousing is available to take her to appointments.  No other resources are needed at this time. Closing referral.   Miracle Criado, AAS Paralegal, [place] . Embedded Care Coordination [facility]  [address], [place] [address] [email or web address]  [phone]  [email or web address]

## 2020-04-04 NOTE — Telephone Encounter (Signed)
Positive HRD and BRCA1 mutation noted. Germline is still pending. Will defer to genetic counselor for notification and further education once germline has resulted. Dr. Tasia Catchings also made aware. Copy of HRD sent to Medical Records.

## 2020-04-07 LAB — ACID FAST CULTURE WITH REFLEXED SENSITIVITIES (MYCOBACTERIA): Acid Fast Culture: NEGATIVE

## 2020-04-08 ENCOUNTER — Encounter: Payer: Self-pay | Admitting: Oncology

## 2020-04-09 ENCOUNTER — Ambulatory Visit: Payer: Self-pay | Admitting: Licensed Clinical Social Worker

## 2020-04-09 ENCOUNTER — Telehealth: Payer: Self-pay | Admitting: Licensed Clinical Social Worker

## 2020-04-09 ENCOUNTER — Encounter: Payer: Self-pay | Admitting: Licensed Clinical Social Worker

## 2020-04-09 DIAGNOSIS — C763 Malignant neoplasm of pelvis: Secondary | ICD-10-CM

## 2020-04-09 DIAGNOSIS — Z803 Family history of malignant neoplasm of breast: Secondary | ICD-10-CM

## 2020-04-09 DIAGNOSIS — Z1379 Encounter for other screening for genetic and chromosomal anomalies: Secondary | ICD-10-CM | POA: Insufficient documentation

## 2020-04-09 HISTORY — DX: Encounter for other screening for genetic and chromosomal anomalies: Z13.79

## 2020-04-09 NOTE — Progress Notes (Addendum)
HPI:  [name] was previously seen in the [place] clinic due to a personal and family history of cancer and concerns regarding a hereditary predisposition to cancer. Please refer to our prior cancer genetics clinic note for more information regarding our discussion, assessment and recommendations, at the time. [name] recent genetic test results were disclosed to her, as were recommendations warranted by these results. These results and recommendations are discussed in more detail below.  CANCER HISTORY:  Oncology History   No history exists.    FAMILY HISTORY:  We obtained a detailed, 4-generation family history.  Significant diagnoses are listed below: Family History  Problem Relation Age of Onset  . Cancer Mother        lymphoma  . Stroke Sister   . Cancer Brother        lung  . Cancer Sister        lung  . Lung cancer Brother   . Bone cancer Sister   . Breast cancer Cousin 15       maternal  . Cancer Maternal Aunt        unk types   [name] had a son who died at 22, not cancer related. She has 3 full brothers, 1 full sister, no cancers. She has 4 paternal half brothers, 4 paternal half sisters. Two of these half-brothers had lung cancer, one half-sister had lung cancer, and one half-sister currently has bone cancer.   [name] mother died of lymphoma in her 68s-80s. Patient had 6 maternal aunts, 3 maternal uncles. Two aunts have had cancer but she is unsure the type. A maternal cousin had breast cancer at 69, she is unsure if she had genetic testing. Maternal grandfather died at 22, grandmother died at 19, no cancers.   [name] does not have information about her father's side of the family.  [name] is unaware of previous family history of genetic testing for hereditary cancer risks. There is no reported Ashkenazi Jewish ancestry. There is no known consanguinity.     GENETIC TEST RESULTS: Genetic testing reported out on  04/08/2020 through the [facility] HRD. No pathogenic variants identified on the [facility] (germline) panel. Pathogenic variant identified through [facility] (HRD testing) in BRCA1 called [id number], HRD positive.   The [facility] gene panel offered by [employer] includes sequencing and deletion/duplication testing of the following 35 genes: APC, ATM, AXIN2, BARD1, BMPR1A, BRCA1, BRCA2, BRIP1, CHD1, CDK4, CDKN2A, CHEK2, EPCAM (large rearrangement only), HOXB13, GALNT12, MLH1, MSH2, MSH3, MSH6, MUTYH, NBN, NTHL1, PALB2, PMS2, PTEN, RAD51C, RAD51D, RNF43, RPS20, SMAD4, STK11, and TP53. Sequencing was performed for select regions of POLE and POLD1, and large rearrangement analysis was performed for select regions of GREM1.    The test report has been scanned into EPIC and is located under the Molecular Pathology section of the Results Review tab.  A portion of the result report is included below for reference.        We discussed with [name] that because current genetic testing is not perfect, it is possible there may be a germline gene mutation in one of these genes that current testing cannot detect, but that chance is small.  We also discussed, that there could be another gene that has not yet been discovered, or that we have not yet tested, that is responsible for the cancer diagnoses in the family. It is also possible there is a hereditary cause for the cancer in the  family that [name] did not inherit and therefore was not identified in her testing.  Therefore, it is important to remain in touch with cancer genetics in the future so that we can continue to offer [name] the most up to date genetic testing.   ADDITIONAL GENETIC TESTING: We discussed with [name] that her germline genetic testing was fairly extensive.  If there are genes identified to increase cancer risk that can be analyzed in the future, we would be happy to discuss and coordinate  this testing at that time.    CANCER SCREENING RECOMMENDATIONS: [name] germline (hereditary) test result is considered negative (normal).  This means that we have not identified a hereditary cause for her  personal and family history of cancer at this time.   While reassuring, this does not definitively rule out a hereditary predisposition to cancer. It is still possible that there could be genetic mutations that are undetectable by current technology. There could be genetic mutations in genes that have not been tested or identified to increase cancer risk.  Therefore, it is recommended she continue to follow the cancer management and screening guidelines provided by her oncology and primary healthcare provider.   An individual's cancer risk and medical management are not determined by genetic test results alone. Overall cancer risk assessment incorporates additional factors, including personal medical history, family history, and any available genetic information that may result in a personalized plan for cancer prevention and surveillance.  HRD HRD testing (somatic testing) can help determine patients who may benefit from treatment with PARP inhibitors. A pathogenic variant in BRCA1 was identified in [name]'s omental mass biopsy, meaning her tumor is HRD positive. Therefore, [name] could potentially benefit from PARP inhibitors at some point in her treatment at her care team's discretion.   RECOMMENDATIONS FOR FAMILY MEMBERS:  Relatives in this family might be at some increased risk of developing cancer, over the general population risk, simply due to the family history of cancer.  We recommended female relatives in this family have a yearly mammogram beginning at age 2, or 3 years younger than the earliest onset of cancer, an annual clinical breast exam, and perform monthly breast self-exams. Female relatives in this family should also have a gynecological exam as recommended by  their primary provider.  All family members should be referred for colonoscopy starting at age 60.    It is also possible there is a hereditary cause for the cancer in [name]'s family that she did not inherit and therefore was not identified in her.  Based on [name]'s family history, we recommended her cousin who had breast cancer at 79 have genetic counseling and testing. [name] will let us know if we can be of any assistance in coordinating genetic counseling and/or testing for these family members.  FOLLOW-UP: Lastly, we discussed with [name] that cancer genetics is a rapidly advancing field and it is possible that new genetic tests will be appropriate for her and/or her family members in the future. We encouraged her to remain in contact with cancer genetics on an annual basis so we can update her personal and family histories and let her know of advances in cancer genetics that may benefit this family.   Our contact number was provided. [name] questions were answered to her satisfaction, and she knows she is welcome to call us at anytime with additional questions or concerns.   Faith Rogue, MS, [facility] Genetic Counselor [email or web address] Phone: (  [phone]

## 2020-04-09 NOTE — Telephone Encounter (Signed)
Revealed negative germline genetic testing through [facility]. Revealed positive HRD testing through [facility] - pathogenic variant in BRCA1 identified within the biopsy. This result is reassuring and indicates that it is unlikely Linda Schmitt's cancer is due to a hereditary cause. It is unlikely that there is an increased risk of another cancer due to a mutation in one of these genes.  However, genetic testing is not perfect, and cannot definitively rule out a hereditary cause.  It will be important for her to keep in contact with genetics to learn if any additional testing may be needed in the future.

## 2020-04-09 NOTE — Telephone Encounter (Signed)
Left message for patient that genetic test results are back and to give me a call back when she gets a chance so we can go over them together.

## 2020-04-11 ENCOUNTER — Encounter: Payer: Self-pay | Admitting: Family

## 2020-04-11 DIAGNOSIS — Z1501 Genetic susceptibility to malignant neoplasm of breast: Secondary | ICD-10-CM | POA: Insufficient documentation

## 2020-04-15 ENCOUNTER — Inpatient Hospital Stay: Payer: Medicare Other | Attending: Oncology

## 2020-04-15 ENCOUNTER — Inpatient Hospital Stay (HOSPITAL_BASED_OUTPATIENT_CLINIC_OR_DEPARTMENT_OTHER): Payer: Medicare Other | Admitting: Oncology

## 2020-04-15 ENCOUNTER — Inpatient Hospital Stay: Payer: Medicare Other

## 2020-04-15 ENCOUNTER — Other Ambulatory Visit: Payer: Self-pay

## 2020-04-15 ENCOUNTER — Encounter: Payer: Self-pay | Admitting: Oncology

## 2020-04-15 VITALS — BP 149/85 | HR 89 | Temp 97.0°F | Resp 16 | Wt 229.7 lb

## 2020-04-15 DIAGNOSIS — G620 Drug-induced polyneuropathy: Secondary | ICD-10-CM | POA: Insufficient documentation

## 2020-04-15 DIAGNOSIS — T451X5A Adverse effect of antineoplastic and immunosuppressive drugs, initial encounter: Secondary | ICD-10-CM | POA: Diagnosis not present

## 2020-04-15 DIAGNOSIS — Z809 Family history of malignant neoplasm, unspecified: Secondary | ICD-10-CM | POA: Diagnosis not present

## 2020-04-15 DIAGNOSIS — J910 Malignant pleural effusion: Secondary | ICD-10-CM

## 2020-04-15 DIAGNOSIS — Z79899 Other long term (current) drug therapy: Secondary | ICD-10-CM | POA: Diagnosis not present

## 2020-04-15 DIAGNOSIS — Z803 Family history of malignant neoplasm of breast: Secondary | ICD-10-CM | POA: Insufficient documentation

## 2020-04-15 DIAGNOSIS — Z807 Family history of other malignant neoplasms of lymphoid, hematopoietic and related tissues: Secondary | ICD-10-CM | POA: Diagnosis not present

## 2020-04-15 DIAGNOSIS — Z5111 Encounter for antineoplastic chemotherapy: Secondary | ICD-10-CM

## 2020-04-15 DIAGNOSIS — C763 Malignant neoplasm of pelvis: Secondary | ICD-10-CM | POA: Insufficient documentation

## 2020-04-15 DIAGNOSIS — R971 Elevated cancer antigen 125 [CA 125]: Secondary | ICD-10-CM | POA: Diagnosis not present

## 2020-04-15 DIAGNOSIS — Z87891 Personal history of nicotine dependence: Secondary | ICD-10-CM | POA: Insufficient documentation

## 2020-04-15 DIAGNOSIS — Z801 Family history of malignant neoplasm of trachea, bronchus and lung: Secondary | ICD-10-CM | POA: Insufficient documentation

## 2020-04-15 DIAGNOSIS — G629 Polyneuropathy, unspecified: Secondary | ICD-10-CM

## 2020-04-15 LAB — COMPREHENSIVE METABOLIC PANEL
ALT: 16 U/L (ref 0–44)
AST: 17 U/L (ref 15–41)
Albumin: 4 g/dL (ref 3.5–5.0)
Alkaline Phosphatase: 47 U/L (ref 38–126)
Anion gap: 10 (ref 5–15)
BUN: 9 mg/dL (ref 8–23)
CO2: 27 mmol/L (ref 22–32)
Calcium: 9 mg/dL (ref 8.9–10.3)
Chloride: 104 mmol/L (ref 98–111)
Creatinine, Ser: 0.82 mg/dL (ref 0.44–1.00)
GFR calc Af Amer: >60 mL/min (ref >60)
GFR calc non Af Amer: >60 mL/min (ref >60)
Glucose, Bld: 111 mg/dL — ABNORMAL HIGH (ref 70–99)
Potassium: 3.7 mmol/L (ref 3.5–5.1)
Sodium: 141 mmol/L (ref 135–145)
Total Bilirubin: 0.5 mg/dL (ref 0.3–1.2)
Total Protein: 7.3 g/dL (ref 6.5–8.1)

## 2020-04-15 LAB — CBC WITH DIFFERENTIAL/PLATELET
Abs Immature Granulocytes: 0.04 10*3/uL (ref 0.00–0.07)
Basophils Absolute: 0 10*3/uL (ref 0.0–0.1)
Basophils Relative: 0 %
Eosinophils Absolute: 0 10*3/uL (ref 0.0–0.5)
Eosinophils Relative: 0 %
HCT: 34 % — ABNORMAL LOW (ref 36.0–46.0)
Hemoglobin: 10.9 g/dL — ABNORMAL LOW (ref 12.0–15.0)
Immature Granulocytes: 1 %
Lymphocytes Relative: 28 %
Lymphs Abs: 2.1 10*3/uL (ref 0.7–4.0)
MCH: 28.3 pg (ref 26.0–34.0)
MCHC: 32.1 g/dL (ref 30.0–36.0)
MCV: 88.3 fL (ref 80.0–100.0)
Monocytes Absolute: 0.8 10*3/uL (ref 0.1–1.0)
Monocytes Relative: 11 %
Neutro Abs: 4.4 10*3/uL (ref 1.7–7.7)
Neutrophils Relative %: 60 %
Platelets: 210 10*3/uL (ref 150–400)
RBC: 3.85 MIL/uL — ABNORMAL LOW (ref 3.87–5.11)
RDW: 14.6 % (ref 11.5–15.5)
WBC: 7.4 10*3/uL (ref 4.0–10.5)
nRBC: 0 % (ref 0.0–0.2)

## 2020-04-15 MED ORDER — FAMOTIDINE IN NACL 20-0.9 MG/50ML-% IV SOLN
20.0000 mg | Freq: Once | INTRAVENOUS | Status: AC
  Administered 2020-04-15: 20 mg via INTRAVENOUS
  Filled 2020-04-15: qty 50

## 2020-04-15 MED ORDER — PALONOSETRON HCL INJECTION 0.25 MG/5ML
0.2500 mg | Freq: Once | INTRAVENOUS | Status: AC
  Administered 2020-04-15: 0.25 mg via INTRAVENOUS
  Filled 2020-04-15: qty 5

## 2020-04-15 MED ORDER — SODIUM CHLORIDE 0.9 % IV SOLN
150.0000 mg | Freq: Once | INTRAVENOUS | Status: AC
  Administered 2020-04-15: 150 mg via INTRAVENOUS
  Filled 2020-04-15: qty 150

## 2020-04-15 MED ORDER — SODIUM CHLORIDE 0.9 % IV SOLN
10.0000 mg | Freq: Once | INTRAVENOUS | Status: AC
  Administered 2020-04-15: 10 mg via INTRAVENOUS
  Filled 2020-04-15: qty 10

## 2020-04-15 MED ORDER — SODIUM CHLORIDE 0.9 % IV SOLN
Freq: Once | INTRAVENOUS | Status: AC
  Filled 2020-04-15: qty 250

## 2020-04-15 MED ORDER — SODIUM CHLORIDE 0.9 % IV SOLN
175.0000 mg/m2 | Freq: Once | INTRAVENOUS | Status: AC
  Administered 2020-04-15: 390 mg via INTRAVENOUS
  Filled 2020-04-15: qty 65

## 2020-04-15 MED ORDER — DIPHENHYDRAMINE HCL 50 MG/ML IJ SOLN
50.0000 mg | Freq: Once | INTRAMUSCULAR | Status: AC
  Administered 2020-04-15: 50 mg via INTRAVENOUS
  Filled 2020-04-15: qty 1

## 2020-04-15 MED ORDER — SODIUM CHLORIDE 0.9 % IV SOLN
588.5000 mg | Freq: Once | INTRAVENOUS | Status: AC
  Administered 2020-04-15: 590 mg via INTRAVENOUS
  Filled 2020-04-15: qty 59

## 2020-04-15 NOTE — Progress Notes (Signed)
Patient denies new problems/concerns today.   °

## 2020-04-15 NOTE — Progress Notes (Signed)
Hematology/Oncology Follow Up Note [facility]  [phone] Fax:([phone]  Patient Care Team: Burnard Hawthorne, FNP as PCP - General (Family Medicine) Clent Jacks, RN as Oncology Nurse Navigator   Name of the patient: Linda Schmitt  [id number]  05-06-1956   REASON FOR VISIT  follow-up for serous carcinoma  PERTINENT ONCOLOGY HISTORY Linda Schmitt is a 64 y.o.afemale who has above oncology history reviewed by me today presented for follow up visit for management of malignant pleural effusion status post thoracentesis x2 during her recent admission. Cytology was positive for metastatic carcinoma.  TTF-1 Napsin A, GATA3, CDX2 were negative. Positive for PAX 8, which is most often expressing tumors of gynecological and renal origin. PET scan was independently reviewed by me and discussed with patient.  I also discussed with radiology. Exam showed evidence of peritoneal disease within the abdomen, soft tissue infiltrating into the omentum.  Large loculated left pleural effusion with thin FDG avid rind of soft tissue overlying the left lung.  Small right pleural effusion with mild FDG uptake is equivocal for malignant effusion of the right hemithorax. Fluid density structure within the right side of the pelvis abutting the right-sided of uterus is noted.  This is indeterminate.  Further investigation with pelvic sonogram may be helpful.  Pelvic ultrasound was obtained.-Heterogeneous myometrium with poor definition of endometrial complex. Normal left ovary.  No cystic right adnexal lesion is identified.  No a probable small normal-appearing right ovary is identified.  Suspect that the low-attenuation presumed cystic structure seen in the right adnexa on the prior image corresponds to a small amount of nonspecific free fluid in the right adnexa. I discussed with gynecology oncology Dr.Secord.  Given that cytology from left pleural effusion is  positive for metastatic carcinoma, tumor cells are positive for PAX 8 which indicates GYN origin.  Given that patient has moderate to large amount of pleural effusion, heavy tumor burden, recommend to start chemotherapy as soon as possible with carboplatin AUC 5-6 and Taxol 175 mg/m every 3 weeks for 3 cycles.  Patient will establish care with gynecology oncology for evaluation of participating in clinical trials after neoadjuvant chemo/surgery.  #Pre-existing neuropathy of left hand, she takes gabapentin.  INTERVAL HISTORY 64 year old female presents for follow-up of pleural effusion, metastatic serous carcinoma Patient is status post 2 cycle of carboplatin and Taxol treatment. Overall she tolerates well.   Review of Systems  Constitutional: Negative for appetite change, chills, fatigue and fever.  HENT:   Negative for hearing loss and voice change.   Eyes: Negative for eye problems.  Respiratory: Negative for chest tightness and cough.   Cardiovascular: Negative for chest pain.  Gastrointestinal: Negative for abdominal distention, abdominal pain and blood in stool.  Endocrine: Negative for hot flashes.  Genitourinary: Negative for difficulty urinating and frequency.   Musculoskeletal: Negative for arthralgias.  Skin: Negative for itching and rash.  Neurological: Positive for numbness. Negative for extremity weakness.       Pre-existing neuropathy of left hand.  Hematological: Negative for adenopathy.  Psychiatric/Behavioral: Negative for confusion.      No Known Allergies   Past Medical History:  Diagnosis Date  . Family history of breast cancer   . Genetic testing 04/09/2020   No pathogenic variants identified on the [employer] MyRisk (germline) panel. The report date is 04/07/2020.   The [facility] gene panel offered by [employer] includes sequencing and deletion/duplication testing of the following 35 genes: APC, ATM, AXIN2, BARD1,  BMPR1A, BRCA1, BRCA2, BRIP1, CHD1,  CDK4, CDKN2A, CHEK2, EPCAM (large rearrangement only), HOXB13, GALNT12, MLH1, MSH2, MSH3, MSH6  . HLD (hyperlipidemia)      Past Surgical History:  Procedure Laterality Date  . CARPAL TUNNEL RELEASE Left   . SHOULDER ARTHROSCOPY WITH SUBACROMIAL DECOMPRESSION AND OPEN ROTATOR C Right 02/01/2020   Procedure: RIGHT SHOULDER ARTHROSCOPIC SUBSCAPULARIS REPAIR, ROTATOR CUFF REPAIR, SUABCROMIAL DECOMPRESSION, AND BICEPS TENODESIS.;  Surgeon: Leim Fabry, MD;  Location: [facility] ORS;  Service: Orthopedics;  Laterality: Right;  . TUBAL LIGATION      Social History   Socioeconomic History  . Marital status: Single    Spouse name: Not on file  . Number of children: Not on file  . Years of education: Not on file  . Highest education level: Not on file  Occupational History  . Not on file  Tobacco Use  . Smoking status: Former Smoker    Packs/day: 0.75    Years: 40.00    Pack years: 30.00    Types: Cigarettes    Quit date: 02/06/2018    Years since quitting: 2.1  . Smokeless tobacco: Never Used  . Tobacco comment: quit 02/2018  Vaping Use  . Vaping Use: Never used  Substance and Sexual Activity  . Alcohol use: Yes    Comment: 2-3 glasses of wine per week  . Drug use: No  . Sexual activity: Not on file  Other Topics Concern  . Not on file  Social History Narrative   Lives in [place] alone. Work - [employer]   Diet: Regular   Exercise: walking   Social Determinants of Radio broadcast assistant Strain:   . Difficulty of Paying Living Expenses: Not on file  Food Insecurity: No Food Insecurity  . Worried About Charity fundraiser in the Last Year: Never true  . Ran Out of Food in the Last Year: Never true  Transportation Needs: No Transportation Needs  . Lack of Transportation (Medical): No  . Lack of Transportation (Non-Medical): No  Physical Activity:   . Days of Exercise per Week: Not on file  . Minutes of Exercise per Session: Not on file  Stress: No Stress Concern Present    . Feeling of Stress : Not at all  Social Connections:   . Frequency of Communication with Friends and Family: Not on file  . Frequency of Social Gatherings with Friends and Family: Not on file  . Attends Religious Services: Not on file  . Active Member of Clubs or Organizations: Not on file  . Attends Archivist Meetings: Not on file  . Marital Status: Not on file  Intimate Partner Violence: Not At Risk  . Fear of Current or Ex-Partner: No  . Emotionally Abused: No  . Physically Abused: No  . Sexually Abused: No    Family History  Problem Relation Age of Onset  . Cancer Mother        lymphoma  . Stroke Sister   . Cancer Brother        lung  . Cancer Sister        lung  . Lung cancer Brother   . Bone cancer Sister   . Breast cancer Cousin 24       maternal  . Cancer Maternal Aunt        unk types     Current Outpatient Medications:  .  acetaminophen (TYLENOL) 500 MG tablet, Take 2 tablets (1,000 mg total) by mouth every 8 (eight) hours.,  Disp: 90 tablet, Rfl: 2 .  cholecalciferol (QC VITAMIN D3) 10 MCG (400 UNIT) TABS tablet, Take 800 Units by mouth daily., Disp: , Rfl:  .  furosemide (LASIX) 40 MG tablet, Take 40 mg by mouth daily., Disp: , Rfl:  .  gabapentin (NEURONTIN) 100 MG capsule, Take 1 capsule (100 mg total) by mouth 3 (three) times daily. (Patient taking differently: Take 100 mg by mouth at bedtime. Taking 1 QD), Disp: 90 capsule, Rfl: 3 .  lidocaine-prilocaine (EMLA) cream, Apply to affected area once, Disp: 30 g, Rfl: 3 .  ondansetron (ZOFRAN) 8 MG tablet, Take 1 tablet (8 mg total) by mouth 2 (two) times daily as needed for refractory nausea / vomiting. Start on day 3 after carboplatin chemo., Disp: 30 tablet, Rfl: 1 .  oxyCODONE (ROXICODONE) 5 MG immediate release tablet, Take 1-2 tablets (5-10 mg total) by mouth every 4 (four) hours as needed (pain)., Disp: 30 tablet, Rfl: 0 .  pravastatin (PRAVACHOL) 40 MG tablet, TAKE 1 TABLET(40 MG) BY MOUTH  DAILY, Disp: 90 tablet, Rfl: 0 .  prochlorperazine (COMPAZINE) 10 MG tablet, Take 1 tablet (10 mg total) by mouth every 6 (six) hours as needed (Nausea or vomiting)., Disp: 30 tablet, Rfl: 1  Physical exam:  Vitals:   04/15/20 0846  BP: (!) 149/85  Pulse: 89  Resp: 16  Temp: (!) 97 F (36.1 C)  Weight: 229 lb 11.2 oz (104.2 kg)   Physical Exam Constitutional:      General: She is not in acute distress. HENT:     Head: Normocephalic and atraumatic.  Eyes:     General: No scleral icterus. Cardiovascular:     Rate and Rhythm: Normal rate and regular rhythm.     Heart sounds: Normal heart sounds.  Pulmonary:     Effort: Pulmonary effort is normal. No respiratory distress.     Breath sounds: No wheezing.     Comments: Diminished breathing sounds.  left lower 1/3  Abdominal:     General: Bowel sounds are normal. There is no distension.     Palpations: Abdomen is soft.  Musculoskeletal:        General: No deformity. Normal range of motion.     Cervical back: Normal range of motion and neck supple.  Skin:    General: Skin is warm and dry.     Findings: No erythema or rash.  Neurological:     Mental Status: She is alert and oriented to person, place, and time. Mental status is at baseline.     Cranial Nerves: No cranial nerve deficit.     Coordination: Coordination normal.  Psychiatric:        Mood and Affect: Mood normal.     CMP Latest Ref Rng & Units 03/25/2020  Glucose 70 - 99 mg/dL 139(H)  BUN 8 - 23 mg/dL 15  Creatinine 0.44 - 1.00 mg/dL 0.82  Sodium 135 - 145 mmol/L 140  Potassium 3.5 - 5.1 mmol/L 3.7  Chloride 98 - 111 mmol/L 106  CO2 22 - 32 mmol/L 26  Calcium 8.9 - 10.3 mg/dL 8.9  Total Protein 6.5 - 8.1 g/dL 7.0  Total Bilirubin 0.3 - 1.2 mg/dL 0.4  Alkaline Phos 38 - 126 U/L 48  AST 15 - 41 U/L 15  ALT 0 - 44 U/L 14   CBC Latest Ref Rng & Units 03/25/2020  WBC 4.0 - 10.5 K/uL 7.7  Hemoglobin 12.0 - 15.0 g/dL 11.5(L)  Hematocrit 36 - 46 % 35.7(L)  Platelets 150 - 400 K/uL 184    RADIOGRAPHIC STUDIES: I have personally reviewed the radiological images as listed and agreed with the findings in the report. CT Chest Wo Contrast  Result Date: 02/17/2020 CLINICAL DATA:  64 year old female with pleural effusion. EXAM: CT CHEST WITHOUT CONTRAST TECHNIQUE: Multidetector CT imaging of the chest was performed following the standard protocol without IV contrast. COMPARISON:  Chest radiograph dated 02/17/2020 and CT dated 10/11/2019. FINDINGS: Evaluation of this exam is limited in the absence of intravenous contrast. Cardiovascular: There is no cardiomegaly or pericardial effusion. Minimal atherosclerotic calcification of the aortic arch. The aorta and central pulmonary arteries are otherwise grossly unremarkable on this noncontrast CT. Mediastinum/Nodes: There is slight shift of the mediastinum to the right of the midline secondary to mass effect caused by pleural effusion. No definite hilar or mediastinal adenopathy. Evaluation however is very limited in the absence of contrast as well as due to consolidative changes of the left lung. The esophagus and the thyroid gland are grossly unremarkable. No mediastinal fluid collection. Lungs/Pleura: There is a large left pleural effusion with complete consolidative changes of the left lower lobe and majority of the left upper lobe which may represent compressive atelectasis or infiltrate. Underlying mass is not excluded. Clinical correlation and follow-up recommended. Thoracentesis may provide diagnostic information and symptomatic relief. These findings are new since the study of 10/11/2019. The right lung is clear. There is no pneumothorax. The central airways are patent. Upper Abdomen: Indeterminate 15 mm left adrenal nodule. Musculoskeletal: No chest wall mass or suspicious bone lesions identified. IMPRESSION: Large left pleural effusion with consolidative changes of the majority of the left lung, new since the  prior CT of 10/11/2019. Clinical correlation and follow-up to resolution recommended. Thoracentesis may provide additional diagnostic information and symptomatic relief. Electronically Signed   By: Anner Crete M.D.   On: 02/17/2020 19:14   MR Brain W Wo Contrast  Result Date: 02/27/2020 CLINICAL DATA:  Metastatic carcinoma. Malignant pleural effusion. Intraperitoneal tumor. Staging for intracranial disease. EXAM: MRI HEAD WITHOUT AND WITH CONTRAST TECHNIQUE: Multiplanar, multiecho pulse sequences of the brain and surrounding structures were obtained without and with intravenous contrast. CONTRAST:  67m GADAVIST GADOBUTROL 1 MMOL/ML IV SOLN COMPARISON:  None. FINDINGS: Brain: Diffusion imaging does not show any acute or subacute infarction. Brainstem and cerebellum are normal. There is a single old white matter infarction in the right posterior frontal white matter. No cortical or large vessel territory infarction. After contrast administration, no abnormal enhancement occurs. No evidence of primary or metastatic mass lesion. No leptomeningeal disease. Vascular: Major vessels at the base of the brain show flow. Skull and upper cervical spine: Negative Sinuses/Orbits: Clear/normal Other: None IMPRESSION: No evidence of regional metastatic disease. Normal study with exception of an old white matter infarction in the right posterior frontal region. Electronically Signed   By: MNelson ChimesM.D.   On: 02/27/2020 19:00   NM PET Image Initial (PI) Skull Base To Thigh  Result Date: 02/26/2020 CLINICAL DATA:  Initial treatment strategy for malignant pleural effusion. EXAM: NUCLEAR MEDICINE PET SKULL BASE TO THIGH TECHNIQUE: 12.83 mCi F-18 FDG was injected intravenously. Full-ring PET imaging was performed from the skull base to thigh after the radiotracer. CT data was obtained and used for attenuation correction and anatomic localization. Fasting blood glucose: 91 mg/dl COMPARISON:  None. FINDINGS: Mediastinal  blood pool activity: SUV max 3.16 Liver activity: SUV max NA NECK: There is symmetric radiotracer uptake noted within the base of tongue  and bilateral tonsillar regions with SUV max of 7.49. No FDG avid lymph nodes identified within the soft tissues of the neck. Incidental CT findings: none CHEST: No FDG avid axillary or supraclavicular lymph nodes. No FDG avid mediastinal or hilar lymph nodes. Known malignant, loculated left pleural effusion is noted with overlying compressive type atelectasis. This has decreased in volume when compared with pre thoracentesis chest CT dated 02/17/2020. There is a thin rind of mildly FDG avid soft tissue overlying the left lung compatible with malignant pleural effusion. The corresponding SUV max ranges between 3.25 and 2.83. There is a trace amount of right pleural fluid which also exhibits mild FDG uptake within SUV max of 3.15, image 93/3. No suspicious or FDG avid pulmonary nodule or mass identified. Incidental CT findings: Mild aortic atherosclerosis. ABDOMEN/PELVIS: There is no abnormal FDG uptake within the liver, pancreas, spleen, or adrenal glands. Left adrenal nodule measures 1.4 cm and does not exhibit any significant tracer uptake favoring a benign adenoma. No enlarged or FDG avid retroperitoneal or pelvic lymph nodes. FDG avid soft tissue infiltration of the omentum is identified compatible with peritoneal carcinomatosis. Focal area of increased omental soft tissue along the midline of the abdomen measures 4.7 x 2.2 cm and has an SUV max of 7.27. Additional small peritoneal nodules are noted scattered within the upper abdomen including on image 169/3 and image 153/3. No ascites identified. Within the right pelvis abutting the right side of the uterus is a fluid density structure which measures 4.3 x 2.7 cm within SUV max of 4.20. Incidental CT findings: Aortic atherosclerosis. SKELETON: No focal hypermetabolic activity to suggest skeletal metastasis. Incidental CT  findings: none IMPRESSION: 1. Exam shows evidence of peritoneal disease within the abdomen including FDG avid soft tissue infiltration into the omentum. 2. Large, loculated left pleural effusion with thin, FDG avid rind of soft tissue overlying the left lung. This corresponds to known, pathology proven malignant pleural effusion. 3. Small right pleural effusion with mild FDG uptake is equivocal for malignant effusion of the right hemithorax. 4. Fluid density structure within right side of pelvis abutting the right-side of the uterus is noted. This is indeterminate. Further investigation with pelvic sonogram may be helpful. Electronically Signed   By: Kerby Moors M.D.   On: 02/26/2020 15:36   CT Biopsy  Result Date: 03/05/2020 INDICATION: 64 year old female with a history of FDG avid omental mass EXAM: CT BIOPSY MEDICATIONS: None. ANESTHESIA/SEDATION: Moderate (conscious) sedation was employed during this procedure. A total of Versed 2.0 mg and Fentanyl 100 mcg was administered intravenously. Moderate Sedation Time: 15 minutes. The patient's level of consciousness and vital signs were monitored continuously by radiology nursing throughout the procedure under my direct supervision. FLUOROSCOPY TIME:  CT COMPLICATIONS: None PROCEDURE: Informed written consent was obtained from the patient after a thorough discussion of the procedural risks, benefits and alternatives. All questions were addressed. Maximal Sterile Barrier Technique was utilized including caps, mask, sterile gowns, sterile gloves, sterile drape, hand hygiene and skin antiseptic. A timeout was performed prior to the initiation of the procedure. Patient positioned supine position on the CT gantry table. Scout CT was acquired for planning purposes. Using CT guidance, 18 gauge needle was advanced into the omental mass from a tangential approach, with a safe, greater than 3 cm margin to the adjacent bowel. Multiple 18 gauge core biopsy were performed.  Two separate devices were required given failure of the first device. Specimens placed into formalin. Needle was removed and a final CT  was acquired. Patient tolerated the procedure well and remained hemodynamically stable throughout. No complications were encountered and no significant blood loss. IMPRESSION: Status post CT-guided omental mass biopsy. Signed, Dulcy Fanny. Dellia Nims, RPVI [facility] Electronically Signed   By: Corrie Mckusick D.O.   On: 03/05/2020 11:34   DG Chest Port 1 View  Result Date: 02/19/2020 CLINICAL DATA:  Status post left thoracentesis EXAM: PORTABLE CHEST 1 VIEW COMPARISON:  02/18/2020 FINDINGS: Large left pleural effusion has been partially evacuated. Small residual left pleural effusion persists. Mild left basilar atelectasis results in mild left-sided volume loss. No pneumothorax. Right lung is clear. Cardiac size within normal limits. No acute bone abnormality. IMPRESSION: No pneumothorax following left thoracentesis. Small residual left pleural effusion. Electronically Signed   By: Fidela Salisbury MD   On: 02/19/2020 17:03   DG Chest Port 1 View  Result Date: 02/18/2020 CLINICAL DATA:  Status post left thoracentesis EXAM: PORTABLE CHEST 1 VIEW COMPARISON:  02/17/2020 FINDINGS: Large left pleural effusion remains, slightly decreased since prior study. No pneumothorax following thoracentesis. Left lower lobe atelectasis. No focal opacity on the right. Heart is normal size. IMPRESSION: Large left pleural effusion remains following thoracentesis. No pneumothorax. Electronically Signed   By: Rolm Baptise M.D.   On: 02/18/2020 12:18   DG Chest Port 1 View  Result Date: 02/17/2020 CLINICAL DATA:  Chest pain and shortness of breath EXAM: PORTABLE CHEST 1 VIEW COMPARISON:  05/23/2016, 10/11/2019, 01/09/2020 FINDINGS: Single frontal view of the chest demonstrates interval opacification of the lower 2/3 of the left hemithorax,  consistent with effusion and consolidation. Right chest is clear. No pneumothorax. No acute bony abnormalities. Chronic Hill-Sachs deformity and degenerative changes of the right shoulder. IMPRESSION: 1. Interval development of significant left lung consolidation and/or effusion. Electronically Signed   By: Randa Ngo M.D.   On: 02/17/2020 18:03   [place] OR NERVE BLOCK-IMAGE ONLY [facility])  Result Date: 02/01/2020 There is no interpretation for this exam.  This order is for images obtained during a surgical procedure.  Please See "Surgeries" Tab for more information regarding the procedure.   ECHOCARDIOGRAM COMPLETE  Result Date: 02/19/2020    ECHOCARDIOGRAM REPORT   Patient Name:   DALEIGH POLLINGER Date of Exam: 02/18/2020 Medical Rec #:  [id number]         Height:       63.0 in Accession #:    [id number]        Weight:       229.3 lb Date of Birth:  April 06, 1956         BSA:          2.049 m Patient Age:    68 years          BP:           114/69 mmHg Patient Gender: F                 HR:           87 bpm. Exam Location:  [facility] Procedure: 2D Echo, Cardiac Doppler and Color Doppler Indications:     Chest Pain 786.50  History:         Patient has no prior history of Echocardiogram examinations.                  HLD.  Sonographer:     Alyse Low Roar Referring Phys:  [id number] Bonnielee Haff Diagnosing Phys: Nelva Bush MD IMPRESSIONS  1. Left  ventricular ejection fraction, by estimation, is 65 to 70%. The left ventricle has normal function. Left ventricular endocardial border not optimally defined to evaluate regional wall motion. There is mild left ventricular hypertrophy. Left ventricular diastolic parameters are consistent with Grade I diastolic dysfunction (impaired relaxation).  2. Right ventricular systolic function is normal. The right ventricular size is normal. Tricuspid regurgitation signal is inadequate for assessing PA pressure.  3. The mitral valve was not well visualized. No evidence of mitral valve  regurgitation. No evidence of mitral stenosis.  4. The aortic valve was not well visualized. Aortic valve regurgitation is not visualized. No aortic stenosis is present. FINDINGS  Left Ventricle: Left ventricular ejection fraction, by estimation, is 65 to 70%. The left ventricle has normal function. Left ventricular endocardial border not optimally defined to evaluate regional wall motion. The left ventricular internal cavity size was normal in size. There is mild left ventricular hypertrophy. Left ventricular diastolic parameters are consistent with Grade I diastolic dysfunction (impaired relaxation). Right Ventricle: The right ventricular size is normal. No increase in right ventricular wall thickness. Right ventricular systolic function is normal. Tricuspid regurgitation signal is inadequate for assessing PA pressure. Left Atrium: Left atrial size was normal in size. Right Atrium: Right atrial size was normal in size. Pericardium: The pericardium was not well visualized. Mitral Valve: The mitral valve was not well visualized. No evidence of mitral valve regurgitation. No evidence of mitral valve stenosis. Tricuspid Valve: The tricuspid valve is not well visualized. Tricuspid valve regurgitation is trivial. Aortic Valve: The aortic valve was not well visualized. Aortic valve regurgitation is not visualized. No aortic stenosis is present. Aortic valve mean gradient measures 6.0 mmHg. Aortic valve peak gradient measures 13.5 mmHg. Aortic valve area, by VTI measures 2.16 cm. Pulmonic Valve: The pulmonic valve was not well visualized. Pulmonic valve regurgitation is not visualized. No evidence of pulmonic stenosis. Aorta: The aortic root is normal in size and structure. Pulmonary Artery: The pulmonary artery is not well seen. Venous: The inferior vena cava was not well visualized. IAS/Shunts: The interatrial septum was not well visualized.  LEFT VENTRICLE PLAX 2D LVIDd:         4.25 cm  Diastology LVIDs:         2.95  cm  LV e' lateral:   7.29 cm/s LV PW:         1.09 cm  LV E/e' lateral: 10.0 LV IVS:        1.09 cm  LV e' medial:    7.18 cm/s LVOT diam:     1.70 cm  LV E/e' medial:  10.2 LV SV:         52 LV SV Index:   25 LVOT Area:     2.27 cm  RIGHT VENTRICLE RV S prime:     14.10 cm/s LEFT ATRIUM             Index       RIGHT ATRIUM          Index LA diam:        3.60 cm 1.76 cm/m  RA Area:     8.68 cm LA Vol (A2C):   36.9 ml 18.01 ml/m RA Volume:   14.90 ml 7.27 ml/m LA Vol (A4C):   27.1 ml 13.22 ml/m LA Biplane Vol: 31.7 ml 15.47 ml/m  AORTIC VALVE                    PULMONIC VALVE AV Area (Vmax):  2.06 cm     PV Vmax:        0.92 m/s AV Area (Vmean):   2.07 cm     PV Peak grad:   3.4 mmHg AV Area (VTI):     2.16 cm     RVOT Peak grad: 4 mmHg AV Vmax:           184.00 cm/s AV Vmean:          104.000 cm/s AV VTI:            0.238 m AV Peak Grad:      13.5 mmHg AV Mean Grad:      6.0 mmHg LVOT Vmax:         167.00 cm/s LVOT Vmean:        94.900 cm/s LVOT VTI:          0.227 m LVOT/AV VTI ratio: 0.95  AORTA Ao Root diam: 2.40 cm MITRAL VALVE MV Area (PHT): 5.13 cm     SHUNTS MV Decel Time: 148 msec     Systemic VTI:  0.23 m MV E velocity: 73.10 cm/s   Systemic Diam: 1.70 cm MV A velocity: 109.00 cm/s MV E/A ratio:  0.67 MV A Prime:    16.4 cm/s Nelva Bush MD Electronically signed by Nelva Bush MD Signature Date/Time: 02/19/2020/1:00:30 PM    Final    [place] RT UPPER EXTREM LTD SOFT TISSUE NON VASCULAR  Result Date: 01/29/2020 CLINICAL DATA:  Mass of the right medial forearm EXAM: ULTRASOUND RIGHT UPPER EXTREMITY LIMITED TECHNIQUE: Ultrasound examination of the upper extremity soft tissues was performed in the area of clinical concern. COMPARISON:  None. FINDINGS: Real-time sonography of the right anterior forearm was performed with a high-frequency linear transducer. No solid or cystic mass. No architectural distortion. IMPRESSION: No sonographic abnormality at the site of clinical concern.  Electronically Signed   By: Kathreen Devoid   On: 01/29/2020 16:22   US PELVIC COMPLETE WITH TRANSVAGINAL  Result Date: 02/26/2020 CLINICAL DATA:  Malignant pleural effusion, abnormal PET-CT with a cystic structure in the RIGHT adnexa; postmenopausal EXAM: TRANSABDOMINAL AND TRANSVAGINAL ULTRASOUND OF PELVIS TECHNIQUE: Both transabdominal and transvaginal ultrasound examinations of the pelvis were performed. Transabdominal technique was performed for global imaging of the pelvis including uterus, ovaries, adnexal regions, and pelvic cul-de-sac. It was necessary to proceed with endovaginal exam following the transabdominal exam to visualize the uterus, endometrium, and ovaries. COMPARISON:  PET-CT 01/26/2020 FINDINGS: Uterus Measurements: 6.2 x 2.7 x 3.5 cm = volume: 30 mL. Anteverted. Heterogeneous myometrium. No focal mass Endometrium Thickness: 4 mm. Poorly defined. No definite mass or endometrial fluid Right ovary Questionable visualization of RIGHT ovary see below Left ovary Measurements: 1.9 x 0.9 x 1.1 cm = volume: 0.4 mL. Normal morphology without mass Other findings Trace free pelvic fluid. Question cystic lesions seen on prior PET-CT is not definitely visualized. A small focus of soft tissue is identified in the RIGHT adnexa 2.0 x 1.1 x 1.0 cm, suspect representing a small RIGHT ovary. IMPRESSION: Heterogeneous myometrium with poor definition of endometrial complex. Normal LEFT ovary. No cystic RIGHT adnexal lesion is identified, though a probable small normal appearing RIGHT ovary is identified. Suspect that the low-attenuation presume cystic structure seen in the RIGHT adnexa on the prior CT corresponds to a small amount of nonspecific free fluid in the RIGHT adnexa. Electronically Signed   By: Lavonia Dana M.D.   On: 02/26/2020 17:43   US THORACENTESIS ASP PLEURAL SPACE W/IMG GUIDE  Result Date: 02/19/2020 INDICATION: Prior smoker with history of dyspnea and recurrent malignant left pleural effusion.  Request made for therapeutic left thoracentesis. EXAM: ULTRASOUND GUIDED THERAPEUTIC LEFT THORACENTESIS MEDICATIONS: 1% lidocaine to skin and subcutaneous tissue COMPLICATIONS: None immediate. PROCEDURE: An ultrasound guided thoracentesis was thoroughly discussed with the patient and questions answered. The benefits, risks, alternatives and complications were also discussed. The patient understands and wishes to proceed with the procedure. Written consent was obtained. Ultrasound was performed to localize and mark an adequate pocket of fluid in the left chest. The area was then prepped and draped in the normal sterile fashion. 1% Lidocaine was used for local anesthesia. Under ultrasound guidance a 6 Fr Safe-T-Centesis catheter was introduced. Thoracentesis was performed. The catheter was removed and a dressing applied. FINDINGS: A total of approximately 1.2 liters of bloody fluid was removed. Due to patient coughing and chest discomfort only the above amount of fluid was removed today. IMPRESSION: Successful ultrasound guided therapeutic left thoracentesis yielding 1.2 liters of pleural fluid. Read by: Rowe Robert, PA-C Electronically Signed   By: Aletta Edouard M.D.   On: 02/19/2020 16:38   US THORACENTESIS ASP PLEURAL SPACE W/IMG GUIDE  Result Date: 02/18/2020 INDICATION: Patient with history of hyperlipidemia and elective right shoulder surgery performed on February 12, 2020. Patient presents to this facility with shortness of breath and chest pain found to have a pleural effusion. Request is for therapeutic and diagnostic left-sided thoracentesis EXAM: ULTRASOUND GUIDED LEFT THERAPEUTIC AND DIAGNOSTIC THORACENTESIS MEDICATIONS: Lidocaine 1% 10 mL. COMPLICATIONS: None immediate. Patient unable to tolerate additional fluid removal at this time. PROCEDURE: An ultrasound guided thoracentesis was thoroughly discussed with the patient and questions answered. The benefits, risks, alternatives and complications were  also discussed. The patient understands and wishes to proceed with the procedure. Written consent was obtained. Ultrasound was performed to localize and mark an adequate pocket of fluid in the left chest. The area was then prepped and draped in the normal sterile fashion. 1% Lidocaine was used for local anesthesia. Under ultrasound guidance a 6 Fr Safe-T-Centesis catheter was introduced. Thoracentesis was performed. The catheter was removed and a dressing applied. FINDINGS: A total of approximately 1.8 L of serosanguineous fluid was removed. Samples were sent to the laboratory as requested by the clinical team. IMPRESSION: Successful ultrasound guided left-sided therapeutic and diagnostic thoracentesis yielding 1.8 L of pleural fluid. Read by: Rushie Nyhan, NP Electronically Signed   By: Markus Daft M.D.   On: 02/18/2020 12:38     Assessment and plan Patient is a 64 y.o. female presents for follow-up of pleural effusion 1. Serous carcinoma of female pelvis ([place])   2. Encounter for antineoplastic chemotherapy   3. Family history of cancer   4. Malignant pleural effusion   5. 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 Labs are reviewed and discussed with patient. Counts acceptable to proceed with Labs are reviewed and discussed with patient. Proceed with cycle 3 Carboplatin and taxol.  Obtain CT next week. Follow up with gynonc for evaluation of treatment response and surgery.   # No germline BRCA mutation, -Homologous recombination deficiency positive.  Myriad Genomic instability score:  positive, somatic BRCA1 positive.  Future PARP inhibitor maintenance   #Left malignant pleural effusion, status post thoracentesis in the past. She is asymptomatic.  Continue to monitor.  # pre-existing neuropathy, grade 1, continue gabapentin.  We spent sufficient time to discuss many aspect  of care, questions were answered  to patient's satisfaction.  Follow-up 3 weeks for next cycle of treatment.   Earlie Server, MD, PhD Hematology Oncology [facility] at [facility] Pager- [phone] 04/15/2020

## 2020-04-16 LAB — CA 125: Cancer Antigen (CA) 125: 10.5 U/mL (ref 0.0–38.1)

## 2020-04-17 ENCOUNTER — Telehealth: Payer: Self-pay | Admitting: Obstetrics and Gynecology

## 2020-04-17 NOTE — Telephone Encounter (Signed)
Writer received message on this date to move appt with Gyn-Onc from 04-23-20 to 04-30-20 due to CT being on 04-24-20. Writer phoned patient and informed of the above. Appt moved to 04-30-20 at 9:00 and patient made aware.

## 2020-04-23 ENCOUNTER — Encounter: Payer: Self-pay | Admitting: Family

## 2020-04-23 ENCOUNTER — Other Ambulatory Visit: Payer: Self-pay

## 2020-04-23 ENCOUNTER — Other Ambulatory Visit: Payer: Self-pay | Admitting: Family

## 2020-04-23 ENCOUNTER — Ambulatory Visit: Payer: Medicare Other

## 2020-04-23 ENCOUNTER — Ambulatory Visit (INDEPENDENT_AMBULATORY_CARE_PROVIDER_SITE_OTHER): Payer: Medicare Other | Admitting: Family

## 2020-04-23 VITALS — BP 138/84 | HR 89 | Temp 98.5°F | Ht 63.0 in | Wt 229.4 lb

## 2020-04-23 DIAGNOSIS — E785 Hyperlipidemia, unspecified: Secondary | ICD-10-CM

## 2020-04-23 DIAGNOSIS — C763 Malignant neoplasm of pelvis: Secondary | ICD-10-CM

## 2020-04-23 DIAGNOSIS — C799 Secondary malignant neoplasm of unspecified site: Secondary | ICD-10-CM

## 2020-04-23 DIAGNOSIS — J910 Malignant pleural effusion: Secondary | ICD-10-CM

## 2020-04-23 DIAGNOSIS — G629 Polyneuropathy, unspecified: Secondary | ICD-10-CM

## 2020-04-23 DIAGNOSIS — R030 Elevated blood-pressure reading, without diagnosis of hypertension: Secondary | ICD-10-CM | POA: Diagnosis not present

## 2020-04-23 NOTE — Assessment & Plan Note (Addendum)
Coping very well. Referral to social work due to financial hardship. Advised that she get mrna vaccine - 3 dose series due to active malignancy.

## 2020-04-23 NOTE — Patient Instructions (Addendum)
Referral to social work for resources  Let us know if you dont hear back within a week in regards to an appointment being scheduled.   Advise either pfizer or moderna covid vaccine, and you would need 3 total doses  It is imperative that you are seen AT least twice per year for labs and monitoring. Monitor blood pressure at home and me 5-6 reading on separate days. Goal is less than 120/80, based on newest guidelines, however we certainly want to be less than 130/80;  if persistently higher, please make sooner follow up appointment so we can recheck you blood pressure and manage/ adjust medications.    Managing Your Hypertension Hypertension is commonly called high blood pressure. This is when the force of your blood pressing against the walls of your arteries is too strong. Arteries are blood vessels that carry blood from your heart throughout your body. Hypertension forces the heart to work harder to pump blood, and may cause the arteries to become narrow or stiff. Having untreated or uncontrolled hypertension can cause heart attack, stroke, kidney disease, and other problems. What are blood pressure readings? A blood pressure reading consists of a higher number over a lower number. Ideally, your blood pressure should be below 120/80. The first ("top") number is called the systolic pressure. It is a measure of the pressure in your arteries as your heart beats. The second ("bottom") number is called the diastolic pressure. It is a measure of the pressure in your arteries as the heart relaxes. What does my blood pressure reading mean? Blood pressure is classified into four stages. Based on your blood pressure reading, your health care provider may use the following stages to determine what type of treatment you need, if any. Systolic pressure and diastolic pressure are measured in a unit called mm Hg. Normal  Systolic pressure: below 606.  Diastolic pressure: below 80. Elevated  Systolic  pressure: 301-601.  Diastolic pressure: below 80. Hypertension stage 1  Systolic pressure: 093-235.  Diastolic pressure: 57-32. Hypertension stage 2  Systolic pressure: 202 or above.  Diastolic pressure: 90 or above. What health risks are associated with hypertension? Managing your hypertension is an important responsibility. Uncontrolled hypertension can lead to:  A heart attack.  A stroke.  A weakened blood vessel (aneurysm).  Heart failure.  Kidney damage.  Eye damage.  Metabolic syndrome.  Memory and concentration problems. What changes can I make to manage my hypertension? Hypertension can be managed by making lifestyle changes and possibly by taking medicines. Your health care provider will help you make a plan to bring your blood pressure within a normal range. Eating and drinking   Eat a diet that is high in fiber and potassium, and low in salt (sodium), added sugar, and fat. An example eating plan is called the DASH (Dietary Approaches to Stop Hypertension) diet. To eat this way: ? Eat plenty of fresh fruits and vegetables. Try to fill half of your plate at each meal with fruits and vegetables. ? Eat whole grains, such as whole wheat pasta, brown rice, or whole grain bread. Fill about one quarter of your plate with whole grains. ? Eat low-fat diary products. ? Avoid fatty cuts of meat, processed or cured meats, and poultry with skin. Fill about one quarter of your plate with lean proteins such as fish, chicken without skin, beans, eggs, and tofu. ? Avoid premade and processed foods. These tend to be higher in sodium, added sugar, and fat.  Reduce your daily sodium  intake. Most people with hypertension should eat less than 1,500 mg of sodium a day.  Limit alcohol intake to no more than 1 drink a day for nonpregnant women and 2 drinks a day for men. One drink equals 12 oz of beer, 5 oz of wine, or 1 oz of hard liquor. Lifestyle  Work with your health care  provider to maintain a healthy body weight, or to lose weight. Ask what an ideal weight is for you.  Get at least 30 minutes of exercise that causes your heart to beat faster (aerobic exercise) most days of the week. Activities may include walking, swimming, or biking.  Include exercise to strengthen your muscles (resistance exercise), such as weight lifting, as part of your weekly exercise routine. Try to do these types of exercises for 30 minutes at least 3 days a week.  Do not use any products that contain nicotine or tobacco, such as cigarettes and e-cigarettes. If you need help quitting, ask your health care provider.  Control any long-term (chronic) conditions you have, such as high cholesterol or diabetes. Monitoring  Monitor your blood pressure at home as told by your health care provider. Your personal target blood pressure may vary depending on your medical conditions, your age, and other factors.  Have your blood pressure checked regularly, as often as told by your health care provider. Working with your health care provider  Review all the medicines you take with your health care provider because there may be side effects or interactions.  Talk with your health care provider about your diet, exercise habits, and other lifestyle factors that may be contributing to hypertension.  Visit your health care provider regularly. Your health care provider can help you create and adjust your plan for managing hypertension. Will I need medicine to control my blood pressure? Your health care provider may prescribe medicine if lifestyle changes are not enough to get your blood pressure under control, and if:  Your systolic blood pressure is 130 or higher.  Your diastolic blood pressure is 80 or higher. Take medicines only as told by your health care provider. Follow the directions carefully. Blood pressure medicines must be taken as prescribed. The medicine does not work as well when you  skip doses. Skipping doses also puts you at risk for problems. Contact a health care provider if:  You think you are having a reaction to medicines you have taken.  You have repeated (recurrent) headaches.  You feel dizzy.  You have swelling in your ankles.  You have trouble with your vision. Get help right away if:  You develop a severe headache or confusion.  You have unusual weakness or numbness, or you feel faint.  You have severe pain in your chest or abdomen.  You vomit repeatedly.  You have trouble breathing. Summary  Hypertension is when the force of blood pumping through your arteries is too strong. If this condition is not controlled, it may put you at risk for serious complications.  Your personal target blood pressure may vary depending on your medical conditions, your age, and other factors. For most people, a normal blood pressure is less than 120/80.  Hypertension is managed by lifestyle changes, medicines, or both. Lifestyle changes include weight loss, eating a healthy, low-sodium diet, exercising more, and limiting alcohol. This information is not intended to replace advice given to you by your health care provider. Make sure you discuss any questions you have with your health care provider. Document Revised: 11/17/2018 Document  Reviewed: 06/23/2016 Elsevier Patient Education  [employer].

## 2020-04-23 NOTE — Assessment & Plan Note (Signed)
Stable, continue gabapentin

## 2020-04-23 NOTE — Assessment & Plan Note (Signed)
Advised to start low dose blood pressure medication. Patient politely declines and would prefer to monitor. Advised to call with readings from home.

## 2020-04-23 NOTE — Assessment & Plan Note (Addendum)
Symptomatically stable. Will follow 

## 2020-04-23 NOTE — Assessment & Plan Note (Signed)
Stable, continue pravastatin

## 2020-04-23 NOTE — Progress Notes (Signed)
Subjective:    Patient ID: Linda Schmitt, female    DOB: 03-20-1956, 64 y.o.   MRN: [id number]  CC: Linda Schmitt is a 64 y.o. female who presents today for follow up.   HPI: Feels well today. No new complaints. Eating well.  Family supportive of cancer diagnosis and she feels upbeat. Strong faith and knows 'will beat this'.  No depression, anxiety.    No pain in pelvis, left malignant pleural effusion. Hasnt needed roxicodone nor lasix. No sob.  Right shoulder pain- continuing to do PT. Taking tylenol prn.   HLD- compliant with pravastatin  Struggling to pay medical bills. Not working currently.   Complains of intermittent tingling in hands and feet.  Gabapentin is helpful and usually takes gabapentin 100 or 271m daily ( in divided doses_  [place]  No h/o anaphylaxis to vaccines or miralax.  Dr YTasia Catchings-serous carcinoma of female pelvis; currently on chemo. CT chest abdomen scheduled for tomorrow   HISTORY:  Past Medical History:  Diagnosis Date  . Family history of breast cancer   . Genetic testing 04/09/2020   No pathogenic variants identified on the Myriad MyRisk (germline) panel. The report date is 04/07/2020.   The [facility] offered by [employer] sequencing and deletion/duplication testing of the following 35 genes: APC, ATM, AXIN2, BARD1, BMPR1A, BRCA1, BRCA2, BRIP1, CHD1, CDK4, CDKN2A, CHEK2, EPCAM (large rearrangement only), HOXB13, GALNT12, MLH1, MSH2, MSH3, MSH6  . HLD (hyperlipidemia)    Past Surgical History:  Procedure Laterality Date  . CARPAL TUNNEL RELEASE Left   . SHOULDER ARTHROSCOPY WITH SUBACROMIAL DECOMPRESSION AND OPEN ROTATOR C Right 02/01/2020   Procedure: RIGHT SHOULDER ARTHROSCOPIC SUBSCAPULARIS REPAIR, ROTATOR CUFF REPAIR, SUABCROMIAL DECOMPRESSION, AND BICEPS TENODESIS.;  Surgeon: PLeim Fabry MD;  Location: [facility] ORS;  Service: Orthopedics;  Laterality: Right;  . TUBAL LIGATION     Family  History  Problem Relation Age of Onset  . Cancer Mother        lymphoma  . Stroke Sister   . Cancer Brother        lung  . Cancer Sister        lung  . Lung cancer Brother   . Bone cancer Sister   . Breast cancer Cousin 442      maternal  . Cancer Maternal Aunt        unk types    Allergies: Patient has no known allergies. Current Outpatient Medications on File Prior to Visit  Medication Sig Dispense Refill  . acetaminophen (TYLENOL) 500 MG tablet Take 2 tablets (1,000 mg total) by mouth every 8 (eight) hours. 90 tablet 2  . cholecalciferol (QC VITAMIN D3) 10 MCG (400 UNIT) TABS tablet Take 800 Units by mouth daily.    .Marland Kitchengabapentin (NEURONTIN) 100 MG capsule Take 1 capsule (100 mg total) by mouth 3 (three) times daily. (Patient taking differently: Take 100 mg by mouth at bedtime. Taking 1 QD) 90 capsule 3  . pravastatin (PRAVACHOL) 40 MG tablet TAKE 1 TABLET(40 MG) BY MOUTH DAILY 90 tablet 0  . furosemide (LASIX) 40 MG tablet Take 40 mg by mouth daily. (Patient not taking: Reported on 04/15/2020)    . lidocaine-prilocaine (EMLA) cream Apply to affected area once (Patient not taking: Reported on 04/23/2020) 30 g 3  . ondansetron (ZOFRAN) 8 MG tablet Take 1 tablet (8 mg total) by mouth 2 (two) times daily as needed for refractory nausea / vomiting. Start on  day 3 after carboplatin chemo. (Patient not taking: Reported on 04/15/2020) 30 tablet 1  . oxyCODONE (ROXICODONE) 5 MG immediate release tablet Take 1-2 tablets (5-10 mg total) by mouth every 4 (four) hours as needed (pain). (Patient not taking: Reported on 04/15/2020) 30 tablet 0  . prochlorperazine (COMPAZINE) 10 MG tablet Take 1 tablet (10 mg total) by mouth every 6 (six) hours as needed (Nausea or vomiting). (Patient not taking: Reported on 04/15/2020) 30 tablet 1   No current facility-administered medications on file prior to visit.    Social History   Tobacco Use  . Smoking status: Former Smoker    Packs/day: 0.75    Years: 40.00      Pack years: 30.00    Types: Cigarettes    Quit date: 02/06/2018    Years since quitting: 2.2  . Smokeless tobacco: Never Used  . Tobacco comment: quit 02/2018  Vaping Use  . Vaping Use: Never used  Substance Use Topics  . Alcohol use: Yes    Comment: 2-3 glasses of wine per week  . Drug use: No    Review of Systems  Constitutional: Negative for chills and fever.  Respiratory: Negative for cough and shortness of breath.   Cardiovascular: Negative for chest pain and palpitations.  Gastrointestinal: Negative for nausea and vomiting.  Genitourinary: Negative for pelvic pain.  Musculoskeletal: Positive for arthralgias (right shoulder).      Objective:    BP 138/84   Pulse 89   Temp 98.5 F (36.9 C)   Ht 5' 3"  (1.6 m)   Wt 229 lb 6.4 oz (104.1 kg)   SpO2 98%   BMI 40.64 kg/m  BP Readings from Last 3 Encounters:  04/23/20 138/84  04/15/20 (!) 149/85  03/25/20 (!) 142/97   Wt Readings from Last 3 Encounters:  04/23/20 229 lb 6.4 oz (104.1 kg)  04/15/20 229 lb 11.2 oz (104.2 kg)  03/25/20 231 lb 1.6 oz (104.8 kg)    Physical Exam Vitals reviewed.  Constitutional:      Appearance: She is well-developed.  Eyes:     Conjunctiva/sclera: Conjunctivae normal.  Cardiovascular:     Rate and Rhythm: Normal rate and regular rhythm.     Pulses: Normal pulses.     Heart sounds: Normal heart sounds.  Pulmonary:     Effort: Pulmonary effort is normal.     Breath sounds: Normal breath sounds. No wheezing, rhonchi or rales.  Skin:    General: Skin is warm and dry.  Neurological:     Mental Status: She is alert.  Psychiatric:        Speech: Speech normal.        Behavior: Behavior normal.        Thought Content: Thought content normal.        Assessment & Plan:   Problem List Items Addressed This Visit      Respiratory   Malignant pleural effusion    Symptomatically stable. Will follow        Nervous and Auditory   Neuropathy    Stable, continue gabapentin.         Other   Elevated blood pressure reading    Advised to start low dose blood pressure medication. Patient politely declines and would prefer to monitor. Advised to call with readings from home.       Hyperlipidemia    Stable, continue pravastatin      Metastatic carcinoma ([place]) - Primary   Relevant Orders   Ambulatory referral  to Social Work   Serous carcinoma of female pelvis [facility])    Coping very well. Referral to social work due to financial hardship. Advised that she get mrna vaccine - 3 dose series due to active malignancy.           I am having Danny Lawless Deaton maintain her gabapentin, cholecalciferol, oxyCODONE, acetaminophen, lidocaine-prilocaine, ondansetron, prochlorperazine, furosemide, and pravastatin.   No orders of the defined types were placed in this encounter.   Return precautions given.   Risks, benefits, and alternatives of the medications and treatment plan prescribed today were discussed, and patient expressed understanding.   Education regarding symptom management and diagnosis given to patient on AVS.  Continue to follow with Burnard Hawthorne, FNP for routine health maintenance.   [place]rard and I agreed with plan.   [name], FNP

## 2020-04-24 ENCOUNTER — Other Ambulatory Visit: Payer: Self-pay

## 2020-04-24 ENCOUNTER — Ambulatory Visit
Admission: RE | Admit: 2020-04-24 | Discharge: 2020-04-24 | Disposition: A | Discharge status: Home / Self Care | Payer: Medicare Other | Source: Ambulatory Visit | Attending: Oncology | Admitting: Oncology

## 2020-04-24 DIAGNOSIS — C763 Malignant neoplasm of pelvis: Secondary | ICD-10-CM

## 2020-04-24 MED ORDER — IOHEXOL 300 MG/ML  SOLN
100.0000 mL | Freq: Once | INTRAMUSCULAR | Status: AC | PRN
  Administered 2020-04-24: 100 mL via INTRAVENOUS

## 2020-04-30 ENCOUNTER — Other Ambulatory Visit: Payer: Self-pay

## 2020-04-30 ENCOUNTER — Inpatient Hospital Stay: Payer: Medicare Other

## 2020-04-30 ENCOUNTER — Other Ambulatory Visit: Payer: Self-pay | Admitting: Obstetrics and Gynecology

## 2020-04-30 ENCOUNTER — Inpatient Hospital Stay (HOSPITAL_BASED_OUTPATIENT_CLINIC_OR_DEPARTMENT_OTHER): Payer: Medicare Other | Admitting: Obstetrics and Gynecology

## 2020-04-30 ENCOUNTER — Ambulatory Visit
Admission: RE | Admit: 2020-04-30 | Discharge: 2020-04-30 | Disposition: A | Discharge status: Home / Self Care | Payer: Medicare Other | Source: Ambulatory Visit | Attending: Obstetrics and Gynecology | Admitting: Obstetrics and Gynecology

## 2020-04-30 ENCOUNTER — Encounter: Payer: Self-pay | Admitting: Obstetrics and Gynecology

## 2020-04-30 VITALS — BP 151/89 | HR 85 | Wt 229.9 lb

## 2020-04-30 DIAGNOSIS — L539 Erythematous condition, unspecified: Secondary | ICD-10-CM

## 2020-04-30 DIAGNOSIS — Z5111 Encounter for antineoplastic chemotherapy: Secondary | ICD-10-CM | POA: Diagnosis not present

## 2020-04-30 DIAGNOSIS — T801XXA Vascular complications following infusion, transfusion and therapeutic injection, initial encounter: Secondary | ICD-10-CM

## 2020-04-30 DIAGNOSIS — C763 Malignant neoplasm of pelvis: Secondary | ICD-10-CM

## 2020-04-30 DIAGNOSIS — L02419 Cutaneous abscess of limb, unspecified: Secondary | ICD-10-CM

## 2020-04-30 DIAGNOSIS — L02414 Cutaneous abscess of left upper limb: Secondary | ICD-10-CM | POA: Insufficient documentation

## 2020-04-30 LAB — CBC WITH DIFFERENTIAL/PLATELET
Abs Immature Granulocytes: 0.1 10*3/uL — ABNORMAL HIGH (ref 0.00–0.07)
Band Neutrophils: 6 %
Basophils Absolute: 0.1 10*3/uL (ref 0.0–0.1)
Basophils Relative: 2 %
Eosinophils Absolute: 0.1 10*3/uL (ref 0.0–0.5)
Eosinophils Relative: 3 %
HCT: 34.5 % — ABNORMAL LOW (ref 36.0–46.0)
Hemoglobin: 11.1 g/dL — ABNORMAL LOW (ref 12.0–15.0)
Lymphocytes Relative: 60 %
Lymphs Abs: 1.7 10*3/uL (ref 0.7–4.0)
MCH: 28 pg (ref 26.0–34.0)
MCHC: 32.2 g/dL (ref 30.0–36.0)
MCV: 86.9 fL (ref 80.0–100.0)
Monocytes Absolute: 0.4 10*3/uL (ref 0.1–1.0)
Monocytes Relative: 15 %
Myelocytes: 2 %
Neutro Abs: 0.5 10*3/uL — ABNORMAL LOW (ref 1.7–7.7)
Neutrophils Relative %: 12 %
Platelets: 212 10*3/uL (ref 150–400)
RBC: 3.97 MIL/uL (ref 3.87–5.11)
RDW: 15.8 % — ABNORMAL HIGH (ref 11.5–15.5)
Smear Review: ADEQUATE
WBC: 2.9 10*3/uL — ABNORMAL LOW (ref 4.0–10.5)
nRBC: 0 % (ref 0.0–0.2)

## 2020-04-30 NOTE — Patient Instructions (Addendum)
Dr. Windell Moment will see you today at [facility] at 2:00pm. His office will call you to confirm time. His office is on the second floor.

## 2020-04-30 NOTE — Progress Notes (Signed)
Gynecologic Oncology Interval Visit   Referring Provider: Dr. 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 female, initially seen in consultation from Dr. Tasia Catchings for metastatic high grade serous carcinoma of Mullerian origin, now s/p 3 cycles of neoadjuvant carbo-taxol chemotherapy who returns to clinic for review of imaging and discussion of interval debulking.   Treatment Summary: 03/04/20 Cycle 1 carboplatin-paclitaxel 03/25/20 Cycle 2 carboplatin-paclitaxel 04/15/20  Cycle 3 carboplatin-paclitaxel  04/25/20 CT C/A/P revealed moderate left malignant pleural effusion with some pleural thickening and enhancement which was decreased in size compared to prior exam. Omental caking apparent concerning for intraperitoneal metastatic disease. No definite primary malignancy identified. Stable left adrenal nodule. Aortic atherosclerosis.   CA 125 has normalized to 10.5. CA 125 was 53.6 at time of diagnosis.   She has an enlarged arm lesion that started after chemotherapy 04/15/2020 and is red. At one point the area was larger, warm and painful. She has been using ice packs. The size of the lesion has decreased in size but is still prominent.    Gynecologic Oncology History:  Linda Schmitt is a 64 y.o. female, initially seen in consultation from Dr. 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 Her history is as follows:  She presented with malignant pleural effusion two weeks after elective right shoulder surgery. She underwent thoracentesis on 7/12 and 7/13 with 1.8L and 1.2L removed respectively. Cytology was positive for PAX 8. PET scan showed evidence of peritoneal disease within the abdomen, soft tissue infiltrating into the omentum, large loculated left pleural effusion with thin FDG avid rind of soft tissue overlying the left lung. Small right pleural effusion with mild FDG uptake is  equivocal for malignant effusion of the right hemithorax. Fluid density structure within right side of pelvis abutting the right sided uterus noted though indeterminate. Pelvic u/s obtained showed heterogeneous myometrium with poor definition of endometrial complex. Normal left ovary.  No cystic right adnexal lesion is identified.  No a probable small normal-appearing right ovary is identified.  Suspect that the low-attenuation presumed cystic structure seen in the right adnexa on the prior image corresponds to a small amount of nonspecific free fluid in the right adnexa.  Biopsy of omental mass on 03/05/20  DIAGNOSIS:  A. OMENTAL MASS; CT-GUIDED BIOPSY:  - HIGH-GRADE SEROUS CARCINOMA.   Given that cytology from left pleural effusion is positive for metastatic carcinoma, tumor cells are positive for PAX 8 which indicates GYN origin.  Given that patient has moderate to large amount of pleural effusion, heavy tumor burden, recommend to start chemotherapy as soon as possible with carboplatin AUC 5-6 and paclitaxel 175 mg/m every 3 weeks for 3 cycles.  Patient will establish care with gynecology oncology for evaluation of participating in clinical trials after neoadjuvant chemo/surgery.  She initiated chemotherapy on 03/04/2020.  With first cycle shortness of breath improved. She was reluctant to undergo genetic testing but opted to proceed. Results as below.    GENETIC TEST RESULTS:   Germline Testing: 04/07/20- negative. No clinically significant mutation identified. Breast Cancer Riskscore remaining life time risk- 7%.   Somatic Testing: Genetic testing reported out on 04/08/2020 through the [facility] [employer] HRD. No pathogenic variants identified on the [facility] (germline) panel. Pathogenic variant identified through [employer] (HRD testing) in BRCA1 called [id number], HRD positive.   The [facility] gene panel offered by [employer] includes sequencing  and deletion/duplication  testing of the following 35 genes: APC, ATM, AXIN2, BARD1, BMPR1A, BRCA1, BRCA2, BRIP1, CHD1, CDK4, CDKN2A, CHEK2, EPCAM (large rearrangement only), HOXB13, GALNT12, MLH1, MSH2, MSH3, MSH6, MUTYH, NBN, NTHL1, PALB2, PMS2, PTEN, RAD51C, RAD51D, RNF43, RPS20, SMAD4, STK11, and TP53. Sequencing was performed for select regions of POLE and POLD1, and large rearrangement analysis was performed for select regions of GREM1.      Problem List: Patient Active Problem List   Diagnosis Date Noted  . BRCA1 gene mutation positive 04/11/2020  . Genetic testing 04/09/2020  . Serous carcinoma of female pelvis ([place]) 03/20/2020  . Family history of breast cancer   . Encounter for antineoplastic chemotherapy 03/04/2020  . Neuropathy 03/04/2020  . Mass of omentum 02/28/2020  . Goals of care, counseling/discussion 02/26/2020  . Metastatic carcinoma ([place]) 02/26/2020  . Status post thoracentesis   . Malignant pleural effusion   . Pleural effusion 02/17/2020  . Mass of arm, right 01/22/2020  . Elevated blood pressure reading 12/28/2019  . Hand tingling 12/28/2019  . Acute pain of right shoulder 12/01/2018  . Adrenal adenoma, left 11/11/2017  . Atherosclerosis of aorta ([place]) 09/23/2017  . Prediabetes 07/09/2017  . Tendonitis of wrist, right 07/06/2017  . Hyperlipidemia 10/11/2016  . Former smoker 09/21/2016  . Depression, recurrent ([place]) 09/14/2016  . Headache 09/14/2016  . Routine general medical examination at a health care facility 01/14/2015  . Screening for breast cancer 11/12/2013    Past Medical History: Past Medical History:  Diagnosis Date  . Family history of breast cancer   . Genetic testing 04/09/2020   No pathogenic variants identified on the [employer] [facility] (germline) panel. The report date is 04/07/2020.   The [facility] gene panel offered by [employer] includes sequencing and deletion/duplication testing of the following 35 genes: APC, ATM, AXIN2, BARD1, BMPR1A, BRCA1, BRCA2,  BRIP1, CHD1, CDK4, CDKN2A, CHEK2, EPCAM (large rearrangement only), HOXB13, GALNT12, MLH1, MSH2, MSH3, MSH6  . HLD (hyperlipidemia)     Past Surgical History: Past Surgical History:  Procedure Laterality Date  . CARPAL TUNNEL RELEASE Left   . SHOULDER ARTHROSCOPY WITH SUBACROMIAL DECOMPRESSION AND OPEN ROTATOR C Right 02/01/2020   Procedure: RIGHT SHOULDER ARTHROSCOPIC SUBSCAPULARIS REPAIR, ROTATOR CUFF REPAIR, SUABCROMIAL DECOMPRESSION, AND BICEPS TENODESIS.;  Surgeon: Leim Fabry, MD;  Location: [facility] ORS;  Service: Orthopedics;  Laterality: Right;  . TUBAL LIGATION      Past Gynecologic History:    OB History:  OB History  No obstetric history on file.    Family History: Family History  Problem Relation Age of Onset  . Cancer Mother        lymphoma  . Stroke Sister   . Cancer Brother        lung  . Cancer Sister        lung  . Lung cancer Brother   . Bone cancer Sister   . Breast cancer Cousin 25       maternal  . Cancer Maternal Aunt        unk types  Family history significant for lung cancer, lymphoma, and breast cancer.   Social History: Social History   Socioeconomic History  . Marital status: Single    Spouse name: Not on file  . Number of children: Not on file  . Years of education: Not on file  . Highest education level: Not on file  Occupational History  . Not on file  Tobacco Use  . Smoking status: Former Smoker    Packs/day:  0.75    Years: 40.00    Pack years: 30.00    Types: Cigarettes    Quit date: 02/06/2018    Years since quitting: 2.2  . Smokeless tobacco: Never Used  . Tobacco comment: quit 02/2018  Vaping Use  . Vaping Use: Never used  Substance and Sexual Activity  . Alcohol use: Yes    Comment: 2-3 glasses of wine per week  . Drug use: No  . Sexual activity: Not on file  Other Topics Concern  . Not on file  Social History Narrative   Lives in [place] alone. Work - [employer]   Diet: Regular   Exercise: walking   Social  Determinants of Radio broadcast assistant Strain:   . Difficulty of Paying Living Expenses: Not on file  Food Insecurity: No Food Insecurity  . Worried About Charity fundraiser in the Last Year: Never true  . Ran Out of Food in the Last Year: Never true  Transportation Needs: No Transportation Needs  . Lack of Transportation (Medical): No  . Lack of Transportation (Non-Medical): No  Physical Activity:   . Days of Exercise per Week: Not on file  . Minutes of Exercise per Session: Not on file  Stress: No Stress Concern Present  . Feeling of Stress : Not at all  Social Connections:   . Frequency of Communication with Friends and Family: Not on file  . Frequency of Social Gatherings with Friends and Family: Not on file  . Attends Religious Services: Not on file  . Active Member of Clubs or Organizations: Not on file  . Attends Archivist Meetings: Not on file  . Marital Status: Not on file  Intimate Partner Violence: Not At Risk  . Fear of Current or Ex-Partner: No  . Emotionally Abused: No  . Physically Abused: No  . Sexually Abused: No    Allergies: No Known Allergies  Current Medications: Current Outpatient Medications  Medication Sig Dispense Refill  . acetaminophen (TYLENOL) 500 MG tablet Take 2 tablets (1,000 mg total) by mouth every 8 (eight) hours. 90 tablet 2  . cholecalciferol (QC VITAMIN D3) 10 MCG (400 UNIT) TABS tablet Take 800 Units by mouth daily.    . furosemide (LASIX) 40 MG tablet Take 40 mg by mouth daily. (Patient not taking: Reported on 04/15/2020)    . gabapentin (NEURONTIN) 100 MG capsule Take 1 capsule (100 mg total) by mouth 3 (three) times daily. (Patient taking differently: Take 100 mg by mouth at bedtime. Taking 1 QD) 90 capsule 3  . lidocaine-prilocaine (EMLA) cream Apply to affected area once (Patient not taking: Reported on 04/23/2020) 30 g 3  . ondansetron (ZOFRAN) 8 MG tablet Take 1 tablet (8 mg total) by mouth 2 (two) times daily as  needed for refractory nausea / vomiting. Start on day 3 after carboplatin chemo. (Patient not taking: Reported on 04/15/2020) 30 tablet 1  . oxyCODONE (ROXICODONE) 5 MG immediate release tablet Take 1-2 tablets (5-10 mg total) by mouth every 4 (four) hours as needed (pain). (Patient not taking: Reported on 04/15/2020) 30 tablet 0  . pravastatin (PRAVACHOL) 40 MG tablet TAKE 1 TABLET(40 MG) BY MOUTH DAILY 90 tablet 0  . prochlorperazine (COMPAZINE) 10 MG tablet Take 1 tablet (10 mg total) by mouth every 6 (six) hours as needed (Nausea or vomiting). (Patient not taking: Reported on 04/15/2020) 30 tablet 1   No current facility-administered medications for this visit.    Review of Systems General:  no complaints Skin: no complaints Eyes: no complaints HEENT: no complaints Breasts: no complaints Pulmonary: no complaints Cardiac: no complaints Gastrointestinal: no complaints Genitourinary/Sexual: no complaints Ob/Gyn: no complaints Musculoskeletal: no complaints Hematology: no complaints Neurologic/Psych: no complaints   Objective:  Physical Examination:  BP (!) 151/89   Pulse 85   Wt 229 lb 14.4 oz (104.3 kg)   SpO2 100%   BMI 40.72 kg/m     ECOG Performance Status: 1 - Symptomatic but completely ambulatory  GENERAL: Patient is a well appearing female in no acute distress HEENT:  Sclera clear. Anicteric NODES:  Negative axillary, supraclavicular, inguinal lymph node survery LUNGS:  Clear to auscultation bilaterally. There was not decreased breath sounds on the left lower base. There was good air movement.  HEART:  Regular rate and rhythm.  ABDOMEN:  Soft, nontender.  No hernias, incisions well healed. No masses or ascites EXTREMITIES: The LUE is significant for a 4-5 cm raised erythematous lesion on the anterior forearm. The redness was outlined (see photo). The remaining extremity exam was negative. Specifically no peripheral edema. Atraumatic. No cyanosis SKIN:  Clear with no  obvious rashes or skin changes.  NEURO:  Nonfocal. Well oriented.  Appropriate affect.       Pelvic: Exam chaperoned by NP. EGBUS: no lesions, Cervix: no lesions, nontender, mobile, Vagina: no lesions, no bleeding, thin white discharge, nonmalodorous. Uterus: normal size, nontender, mobile Adnexa: no palpable masses, Rectovaginal: deferred  Lab Review No labs on site today CBC with diff  Radiologic Imaging: Relevant imaging including PET scan, pelvic ultrasound were independently reviewed by Dr. Theora Gianotti who agrees with findings.  CT 04/24/2020         Assessment:  Linda Schmitt is a 64 y.o. female diagnosed with metastatic high grade serous cancer of Mullerian origin (BRCA mutation; HRD) s/p initial cycle of therapy with paclitaxel/carboplatin.   Moderate pleural effusion, asymptomatic  LUE skin erythema/mass, possible IV related soft tissue infection  Medical co-morbidities complicating care: Body mass index is 40.72 kg/m. Plan:   Problem List Items Addressed This Visit      Other   Serous carcinoma of female pelvis ([place]) - Primary     Recommend an additional cycle of neoadjuvant carboplatin and paclitaxel chemotherapy, 4th cycle 05/06/2020.  Recommend CT chest after her fourth cycle (05/20/2020) and then we will plan to see her on 05/27/2020 interval debulking surgery on 06/05/2020 with cardiothoracic surgery for thorascopy, possible thoracic tumor debulking.    She may be a candidate for the [facility] trial comparing laparotomy versus MIS approach if her pleural effusion decreases to small.   Discussed LUE findings with Dr. Tasia Catchings and recommended soft tissue US and antibiotics. Area outlined. Dr. Tasia Catchings will manage this finding.   The patient's diagnosis, an outline of the further diagnostic and laboratory studies which will be required, the recommendation for surgery, and alternatives were discussed with her and her accompanying family members.  All questions were answered to  their satisfaction.  I personally had a face to face interaction and evaluated the patient jointly with the NP, Ms. Beckey Rutter.  I have reviewed her history and available records and have performed the key portions of the physical exam including lymph node survey, extremity, abdominal exam, pelvic exam with my findings confirming those documented above by the APP.  I have discussed the case with the APP and the patient.  I agree with the above documentation, assessment and plan which was fully formulated by me.  Counseling was completed  by me.   I personally saw the patient and performed a substantive portion of this encounter in conjunction with the listed APP as documented above.  Frimet Durfee Gaetana Michaelis, MD

## 2020-05-02 ENCOUNTER — Telehealth: Payer: Self-pay

## 2020-05-02 NOTE — Telephone Encounter (Signed)
    MA9/24/2021 1st Attempt  Name: Linda Schmitt   MRN: [id number]   DOB: 15-Mar-1956   AGE: 64 y.o.   GENDER: female   PCP Burnard Hawthorne, FNP.   05/02/20 Spoke with patient about [facility] Application, filing for Medicaid and assistance with utilities, rent and food. Will follow-up with patient next week.    Kesean Serviss, AAS Paralegal, [place] . Embedded Care Coordination [facility]  Care Management  [address], [place] [address] [email or web address]  [phone]   [email or web address]

## 2020-05-06 ENCOUNTER — Inpatient Hospital Stay (HOSPITAL_BASED_OUTPATIENT_CLINIC_OR_DEPARTMENT_OTHER): Payer: Medicare Other | Admitting: Oncology

## 2020-05-06 ENCOUNTER — Encounter: Payer: Self-pay | Admitting: Oncology

## 2020-05-06 ENCOUNTER — Telehealth: Payer: Self-pay

## 2020-05-06 ENCOUNTER — Inpatient Hospital Stay: Payer: Medicare Other

## 2020-05-06 ENCOUNTER — Other Ambulatory Visit: Payer: Self-pay

## 2020-05-06 VITALS — BP 132/79 | HR 91 | Resp 18

## 2020-05-06 VITALS — BP 137/74 | HR 78 | Temp 97.5°F | Resp 16 | Wt 231.0 lb

## 2020-05-06 DIAGNOSIS — L539 Erythematous condition, unspecified: Secondary | ICD-10-CM

## 2020-05-06 DIAGNOSIS — Z5111 Encounter for antineoplastic chemotherapy: Secondary | ICD-10-CM

## 2020-05-06 DIAGNOSIS — C763 Malignant neoplasm of pelvis: Secondary | ICD-10-CM

## 2020-05-06 DIAGNOSIS — J910 Malignant pleural effusion: Secondary | ICD-10-CM

## 2020-05-06 LAB — CBC WITH DIFFERENTIAL/PLATELET
Abs Immature Granulocytes: 0.04 10*3/uL (ref 0.00–0.07)
Basophils Absolute: 0 10*3/uL (ref 0.0–0.1)
Basophils Relative: 1 %
Eosinophils Absolute: 0 10*3/uL (ref 0.0–0.5)
Eosinophils Relative: 1 %
HCT: 32.1 % — ABNORMAL LOW (ref 36.0–46.0)
Hemoglobin: 10.3 g/dL — ABNORMAL LOW (ref 12.0–15.0)
Immature Granulocytes: 1 %
Lymphocytes Relative: 32 %
Lymphs Abs: 1.8 10*3/uL (ref 0.7–4.0)
MCH: 28.5 pg (ref 26.0–34.0)
MCHC: 32.1 g/dL (ref 30.0–36.0)
MCV: 88.7 fL (ref 80.0–100.0)
Monocytes Absolute: 0.8 10*3/uL (ref 0.1–1.0)
Monocytes Relative: 13 %
Neutro Abs: 3 10*3/uL (ref 1.7–7.7)
Neutrophils Relative %: 52 %
Platelets: 179 10*3/uL (ref 150–400)
RBC: 3.62 MIL/uL — ABNORMAL LOW (ref 3.87–5.11)
RDW: 16.3 % — ABNORMAL HIGH (ref 11.5–15.5)
WBC: 5.7 10*3/uL (ref 4.0–10.5)
nRBC: 0 % (ref 0.0–0.2)

## 2020-05-06 LAB — COMPREHENSIVE METABOLIC PANEL
ALT: 15 U/L (ref 0–44)
AST: 16 U/L (ref 15–41)
Albumin: 4 g/dL (ref 3.5–5.0)
Alkaline Phosphatase: 46 U/L (ref 38–126)
Anion gap: 12 (ref 5–15)
BUN: 11 mg/dL (ref 8–23)
CO2: 24 mmol/L (ref 22–32)
Calcium: 9.1 mg/dL (ref 8.9–10.3)
Chloride: 103 mmol/L (ref 98–111)
Creatinine, Ser: 0.72 mg/dL (ref 0.44–1.00)
GFR calc Af Amer: >60 mL/min (ref >60)
GFR calc non Af Amer: >60 mL/min (ref >60)
Glucose, Bld: 112 mg/dL — ABNORMAL HIGH (ref 70–99)
Potassium: 3.6 mmol/L (ref 3.5–5.1)
Sodium: 139 mmol/L (ref 135–145)
Total Bilirubin: 0.5 mg/dL (ref 0.3–1.2)
Total Protein: 7.1 g/dL (ref 6.5–8.1)

## 2020-05-06 MED ORDER — DIPHENHYDRAMINE HCL 50 MG/ML IJ SOLN
50.0000 mg | Freq: Once | INTRAMUSCULAR | Status: AC
  Administered 2020-05-06: 50 mg via INTRAVENOUS
  Filled 2020-05-06: qty 1

## 2020-05-06 MED ORDER — FAMOTIDINE IN NACL 20-0.9 MG/50ML-% IV SOLN
20.0000 mg | Freq: Once | INTRAVENOUS | Status: AC
  Administered 2020-05-06: 20 mg via INTRAVENOUS
  Filled 2020-05-06: qty 50

## 2020-05-06 MED ORDER — SODIUM CHLORIDE 0.9 % IV SOLN
10.0000 mg | Freq: Once | INTRAVENOUS | Status: AC
  Administered 2020-05-06: 10 mg via INTRAVENOUS
  Filled 2020-05-06: qty 10

## 2020-05-06 MED ORDER — SODIUM CHLORIDE 0.9 % IV SOLN
588.5000 mg | Freq: Once | INTRAVENOUS | Status: AC
  Administered 2020-05-06: 590 mg via INTRAVENOUS
  Filled 2020-05-06: qty 59

## 2020-05-06 MED ORDER — SODIUM CHLORIDE 0.9 % IV SOLN
150.0000 mg | Freq: Once | INTRAVENOUS | Status: AC
  Administered 2020-05-06: 150 mg via INTRAVENOUS
  Filled 2020-05-06: qty 150

## 2020-05-06 MED ORDER — SODIUM CHLORIDE 0.9 % IV SOLN
175.0000 mg/m2 | Freq: Once | INTRAVENOUS | Status: AC
  Administered 2020-05-06: 390 mg via INTRAVENOUS
  Filled 2020-05-06: qty 65

## 2020-05-06 MED ORDER — PALONOSETRON HCL INJECTION 0.25 MG/5ML
0.2500 mg | Freq: Once | INTRAVENOUS | Status: AC
  Administered 2020-05-06: 0.25 mg via INTRAVENOUS
  Filled 2020-05-06: qty 5

## 2020-05-06 MED ORDER — SODIUM CHLORIDE 0.9 % IV SOLN
Freq: Once | INTRAVENOUS | Status: AC
  Filled 2020-05-06: qty 250

## 2020-05-06 NOTE — Telephone Encounter (Signed)
    MA9/28/2021 Follow-up call  Name: Linda Schmitt   MRN: [id number]   DOB: Mar 27, 1956   AGE: 64 y.o.   GENDER: female   PCP Burnard Hawthorne, FNP.   05/06/20 Follow-up call.  Left message on voicemail for patient to return my call regarding resources for medical and utitlity assistance.  Will call again later this week.   Dinnis Rog, AAS Paralegal, [place] . Embedded Care Coordination [facility]  Care Management  [address] [place], [place] [address] [email or web address]  [phone]   [email or web address]

## 2020-05-06 NOTE — Progress Notes (Signed)
Patient would like Dr. Tasia Catchings to look at the know on her left arm from her last infusion.

## 2020-05-06 NOTE — Progress Notes (Signed)
Met with Ms. Rostro in infusion. Provided copy of schedule for chest CT scheduled 10/12 at [facility]. Instructions reviewed. Made aware of plan to see Dr. Theora Gianotti at [facility] on 10/19 with surgery 10/28. She is aware dates may change. [facility] will contact her with the final dates/time.

## 2020-05-06 NOTE — Progress Notes (Signed)
Hematology/Oncology Follow Up Note [facility]  Telephone:([phone] [phone]  Patient Care Team: Burnard Hawthorne, FNP as PCP - General (Family Medicine) Clent Jacks, RN as Oncology Nurse Navigator   Name of the patient: Linda Schmitt  [id number]  26-Aug-1955   REASON FOR VISIT  follow-up for serous carcinoma  PERTINENT ONCOLOGY HISTORY Linda Schmitt is a 64 y.o.afemale who has above oncology history reviewed by me today presented for follow up visit for management of malignant pleural effusion status post thoracentesis x2 during her recent admission. Cytology was positive for metastatic carcinoma.  TTF-1 Napsin A, GATA3, CDX2 were negative. Positive for PAX 8, which is most often expressing tumors of gynecological and renal origin. PET scan was independently reviewed by me and discussed with patient.  I also discussed with radiology. Exam showed evidence of peritoneal disease within the abdomen, soft tissue infiltrating into the omentum.  Large loculated left pleural effusion with thin FDG avid rind of soft tissue overlying the left lung.  Small right pleural effusion with mild FDG uptake is equivocal for malignant effusion of the right hemithorax. Fluid density structure within the right side of the pelvis abutting the right-sided of uterus is noted.  This is indeterminate.  Further investigation with pelvic sonogram may be helpful.  Pelvic ultrasound was obtained.-Heterogeneous myometrium with poor definition of endometrial complex. Normal left ovary.  No cystic right adnexal lesion is identified.  No a probable small normal-appearing right ovary is identified.  Suspect that the low-attenuation presumed cystic structure seen in the right adnexa on the prior image corresponds to a small amount of nonspecific free fluid in the right adnexa. I discussed with gynecology oncology Dr.Secord.  Given that cytology from left pleural effusion is  positive for metastatic carcinoma, tumor cells are positive for PAX 8 which indicates GYN origin.  Given that patient has moderate to large amount of pleural effusion, heavy tumor burden, recommend to start chemotherapy as soon as possible with carboplatin AUC 5-6 and Taxol 175 mg/m every 3 weeks for 3 cycles.  Patient will establish care with gynecology oncology for evaluation of participating in clinical trials after neoadjuvant chemo/surgery.  #Pre-existing neuropathy of left hand, she takes gabapentin.  INTERVAL HISTORY 64 year old female presents for follow-up of pleural effusion, metastatic serous carcinoma Patient is status post 3 cycles of carboplatin and Taxol treatment. Overall she tolerates well.  She had CT scanning for evaluation of treatment response. She was seen by Dr. Theora Gianotti on 04/30/2020 and she recommends to proceed with the fourth cycle of chemotherapy and patient will be seen by Dr. Theora Gianotti again on 05/27/2020 for debulking surgery plan on 06/05/2020 with cardiothoracic surgery for thoracoscopy possible thoracic tumor debulking.  Patient was recommended to have to repeat CT of the chest after her chemotherapy.  When she was seen by gynecology oncology, she was found to have left upper extremity soft tissue swelling and tenderness.  Stat ultrasound of that area showed mild superficial edema but no focal fluid collection.  There is a small tubular structure in this area which is likely a superficial vein.  Suspicious for superficial thrombophlebitis.  Patient was also seen by Dr. Peyton Najjar and recommend no drainage.  Patient was started on a course of doxycycline 100 g twice daily for 10 days and the patient still have few more days of antibiotics left. Left upper extremity swelling and tenderness have improved.  Review of Systems  Constitutional: Negative for appetite change, chills, fatigue and fever.  HENT:   Negative for hearing loss and voice change.   Eyes: Negative for eye  problems.  Respiratory: Negative for chest tightness and cough.   Cardiovascular: Negative for chest pain.  Gastrointestinal: Negative for abdominal distention, abdominal pain and blood in stool.  Endocrine: Negative for hot flashes.  Genitourinary: Negative for difficulty urinating and frequency.   Musculoskeletal: Negative for arthralgias.       Left upper extremity swelling and tenderness have improved.  Skin: Negative for itching and rash.  Neurological: Positive for numbness. Negative for extremity weakness.       Pre-existing neuropathy of left hand.  Hematological: Negative for adenopathy.  Psychiatric/Behavioral: Negative for confusion.      No Known Allergies   Past Medical History:  Diagnosis Date  . Family history of breast cancer   . Genetic testing 04/09/2020   No pathogenic variants identified on the [employer] MyRisk (germline) panel. The report date is 04/07/2020.   The [facility] gene panel offered by [employer] includes sequencing and deletion/duplication testing of the following 35 genes: APC, ATM, AXIN2, BARD1, BMPR1A, BRCA1, BRCA2, BRIP1, CHD1, CDK4, CDKN2A, CHEK2, EPCAM (large rearrangement only), HOXB13, GALNT12, MLH1, MSH2, MSH3, MSH6  . HLD (hyperlipidemia)      Past Surgical History:  Procedure Laterality Date  . CARPAL TUNNEL RELEASE Left   . SHOULDER ARTHROSCOPY WITH SUBACROMIAL DECOMPRESSION AND OPEN ROTATOR C Right 02/01/2020   Procedure: RIGHT SHOULDER ARTHROSCOPIC SUBSCAPULARIS REPAIR, ROTATOR CUFF REPAIR, SUABCROMIAL DECOMPRESSION, AND BICEPS TENODESIS.;  Surgeon: Leim Fabry, MD;  Location: [facility] ORS;  Service: Orthopedics;  Laterality: Right;  . TUBAL LIGATION      Social History   Socioeconomic History  . Marital status: Single    Spouse name: Not on file  . Number of children: Not on file  . Years of education: Not on file  . Highest education level: Not on file  Occupational History  . Not on file  Tobacco Use  . Smoking  status: Former Smoker    Packs/day: 0.75    Years: 40.00    Pack years: 30.00    Types: Cigarettes    Quit date: 02/06/2018    Years since quitting: 2.2  . Smokeless tobacco: Never Used  . Tobacco comment: quit 02/2018  Vaping Use  . Vaping Use: Never used  Substance and Sexual Activity  . Alcohol use: Yes    Comment: 2-3 glasses of wine per week  . Drug use: No  . Sexual activity: Not on file  Other Topics Concern  . Not on file  Social History Narrative   Lives in [place] alone. Work - [employer]   Diet: Regular   Exercise: walking   Social Determinants of Radio broadcast assistant Strain:   . Difficulty of Paying Living Expenses: Not on file  Food Insecurity: No Food Insecurity  . Worried About Charity fundraiser in the Last Year: Never true  . Ran Out of Food in the Last Year: Never true  Transportation Needs: No Transportation Needs  . Lack of Transportation (Medical): No  . Lack of Transportation (Non-Medical): No  Physical Activity:   . Days of Exercise per Week: Not on file  . Minutes of Exercise per Session: Not on file  Stress: No Stress Concern Present  . Feeling of Stress : Not at all  Social Connections:   . Frequency of Communication with Friends and Family: Not on file  . Frequency of Social Gatherings with Friends and Family: Not on  file  . Attends Religious Services: Not on file  . Active Member of Clubs or Organizations: Not on file  . Attends Archivist Meetings: Not on file  . Marital Status: Not on file  Intimate Partner Violence: Not At Risk  . Fear of Current or Ex-Partner: No  . Emotionally Abused: No  . Physically Abused: No  . Sexually Abused: No    Family History  Problem Relation Age of Onset  . Cancer Mother        lymphoma  . Stroke Sister   . Cancer Brother        lung  . Cancer Sister        lung  . Lung cancer Brother   . Bone cancer Sister   . Breast cancer Cousin 88       maternal  . Cancer Maternal Aunt          unk types     Current Outpatient Medications:  .  acetaminophen (TYLENOL) 500 MG tablet, Take 2 tablets (1,000 mg total) by mouth every 8 (eight) hours., Disp: 90 tablet, Rfl: 2 .  cholecalciferol (QC VITAMIN D3) 10 MCG (400 UNIT) TABS tablet, Take 800 Units by mouth daily., Disp: , Rfl:  .  furosemide (LASIX) 40 MG tablet, Take 40 mg by mouth daily. (Patient not taking: Reported on 04/15/2020), Disp: , Rfl:  .  gabapentin (NEURONTIN) 100 MG capsule, Take 1 capsule (100 mg total) by mouth 3 (three) times daily. (Patient not taking: Reported on 04/30/2020), Disp: 90 capsule, Rfl: 3 .  lidocaine-prilocaine (EMLA) cream, Apply to affected area once (Patient not taking: Reported on 04/23/2020), Disp: 30 g, Rfl: 3 .  ondansetron (ZOFRAN) 8 MG tablet, Take 1 tablet (8 mg total) by mouth 2 (two) times daily as needed for refractory nausea / vomiting. Start on day 3 after carboplatin chemo. (Patient not taking: Reported on 04/15/2020), Disp: 30 tablet, Rfl: 1 .  oxyCODONE (ROXICODONE) 5 MG immediate release tablet, Take 1-2 tablets (5-10 mg total) by mouth every 4 (four) hours as needed (pain). (Patient not taking: Reported on 04/15/2020), Disp: 30 tablet, Rfl: 0 .  pravastatin (PRAVACHOL) 40 MG tablet, TAKE 1 TABLET(40 MG) BY MOUTH DAILY, Disp: 90 tablet, Rfl: 0 .  prochlorperazine (COMPAZINE) 10 MG tablet, Take 1 tablet (10 mg total) by mouth every 6 (six) hours as needed (Nausea or vomiting). (Patient not taking: Reported on 04/15/2020), Disp: 30 tablet, Rfl: 1  Physical exam:  Vitals:   05/06/20 0836  BP: 137/74  Pulse: 78  Resp: 16  Temp: (!) 97.5 F (36.4 C)  Weight: 231 lb (104.8 kg)   Physical Exam Constitutional:      General: She is not in acute distress. HENT:     Head: Normocephalic and atraumatic.  Eyes:     General: No scleral icterus. Cardiovascular:     Rate and Rhythm: Normal rate and regular rhythm.     Heart sounds: Normal heart sounds.  Pulmonary:     Effort: Pulmonary  effort is normal. No respiratory distress.     Breath sounds: No wheezing.     Comments: Diminished breathing sounds.  left lower 1/3  Abdominal:     General: Bowel sounds are normal. There is no distension.     Palpations: Abdomen is soft.  Musculoskeletal:        General: No deformity. Normal range of motion.     Cervical back: Normal range of motion and neck supple.  Skin:  General: Skin is warm and dry.     Findings: No erythema or rash.  Neurological:     Mental Status: She is alert and oriented to person, place, and time. Mental status is at baseline.     Cranial Nerves: No cranial nerve deficit.     Coordination: Coordination normal.  Psychiatric:        Mood and Affect: Mood normal.   Left upper extremity tubular structure is no longer tender.  No erythema on the skin. Below are pictures of prior to the antibiotics (left)  and today (right)       CMP Latest Ref Rng & Units 04/15/2020  Glucose 70 - 99 mg/dL 111(H)  BUN 8 - 23 mg/dL 9  Creatinine 0.44 - 1.00 mg/dL 0.82  Sodium 135 - 145 mmol/L 141  Potassium 3.5 - 5.1 mmol/L 3.7  Chloride 98 - 111 mmol/L 104  CO2 22 - 32 mmol/L 27  Calcium 8.9 - 10.3 mg/dL 9.0  Total Protein 6.5 - 8.1 g/dL 7.3  Total Bilirubin 0.3 - 1.2 mg/dL 0.5  Alkaline Phos 38 - 126 U/L 47  AST 15 - 41 U/L 17  ALT 0 - 44 U/L 16   CBC Latest Ref Rng & Units 05/06/2020  WBC 4.0 - 10.5 K/uL 5.7  Hemoglobin 12.0 - 15.0 g/dL 10.3(L)  Hematocrit 36 - 46 % 32.1(L)  Platelets 150 - 400 K/uL 179    RADIOGRAPHIC STUDIES: I have personally reviewed the radiological images as listed and agreed with the findings in the report. CT Chest Wo Contrast  Result Date: 02/17/2020 CLINICAL DATA:  64 year old female with pleural effusion. EXAM: CT CHEST WITHOUT CONTRAST TECHNIQUE: Multidetector CT imaging of the chest was performed following the standard protocol without IV contrast. COMPARISON:  Chest radiograph dated 02/17/2020 and CT dated 10/11/2019.  FINDINGS: Evaluation of this exam is limited in the absence of intravenous contrast. Cardiovascular: There is no cardiomegaly or pericardial effusion. Minimal atherosclerotic calcification of the aortic arch. The aorta and central pulmonary arteries are otherwise grossly unremarkable on this noncontrast CT. Mediastinum/Nodes: There is slight shift of the mediastinum to the right of the midline secondary to mass effect caused by pleural effusion. No definite hilar or mediastinal adenopathy. Evaluation however is very limited in the absence of contrast as well as due to consolidative changes of the left lung. The esophagus and the thyroid gland are grossly unremarkable. No mediastinal fluid collection. Lungs/Pleura: There is a large left pleural effusion with complete consolidative changes of the left lower lobe and majority of the left upper lobe which may represent compressive atelectasis or infiltrate. Underlying mass is not excluded. Clinical correlation and follow-up recommended. Thoracentesis may provide diagnostic information and symptomatic relief. These findings are new since the study of 10/11/2019. The right lung is clear. There is no pneumothorax. The central airways are patent. Upper Abdomen: Indeterminate 15 mm left adrenal nodule. Musculoskeletal: No chest wall mass or suspicious bone lesions identified. IMPRESSION: Large left pleural effusion with consolidative changes of the majority of the left lung, new since the prior CT of 10/11/2019. Clinical correlation and follow-up to resolution recommended. Thoracentesis may provide additional diagnostic information and symptomatic relief. Electronically Signed   By: Anner Crete M.D.   On: 02/17/2020 19:14   MR Brain W Wo Contrast  Result Date: 02/27/2020 CLINICAL DATA:  Metastatic carcinoma. Malignant pleural effusion. Intraperitoneal tumor. Staging for intracranial disease. EXAM: MRI HEAD WITHOUT AND WITH CONTRAST TECHNIQUE: Multiplanar, multiecho  pulse sequences of the brain  and surrounding structures were obtained without and with intravenous contrast. CONTRAST:  85m GADAVIST GADOBUTROL 1 MMOL/ML IV SOLN COMPARISON:  None. FINDINGS: Brain: Diffusion imaging does not show any acute or subacute infarction. Brainstem and cerebellum are normal. There is a single old white matter infarction in the right posterior frontal white matter. No cortical or large vessel territory infarction. After contrast administration, no abnormal enhancement occurs. No evidence of primary or metastatic mass lesion. No leptomeningeal disease. Vascular: Major vessels at the base of the brain show flow. Skull and upper cervical spine: Negative Sinuses/Orbits: Clear/normal Other: None IMPRESSION: No evidence of regional metastatic disease. Normal study with exception of an old white matter infarction in the right posterior frontal region. Electronically Signed   By: MNelson ChimesM.D.   On: 02/27/2020 19:00   CT CHEST ABDOMEN PELVIS W CONTRAST  Result Date: 04/25/2020 CLINICAL DATA:  64year old female with history of metastatic carcinoma with malignant pleural effusion. Follow-up study. EXAM: CT CHEST, ABDOMEN, AND PELVIS WITH CONTRAST TECHNIQUE: Multidetector CT imaging of the chest, abdomen and pelvis was performed following the standard protocol during bolus administration of intravenous contrast. CONTRAST:  1046mOMNIPAQUE IOHEXOL 300 MG/ML  SOLN COMPARISON:  Chest CT 02/17/2020. No prior CT the abdomen or pelvis. FINDINGS: CT CHEST FINDINGS Cardiovascular: Heart size is normal. There is no significant pericardial fluid, thickening or pericardial calcification. Aortic atherosclerosis. No definite coronary artery calcifications. Mediastinum/Nodes: No pathologically enlarged mediastinal or hilar lymph nodes. Esophagus is unremarkable in appearance. No axillary lymphadenopathy. Lungs/Pleura: Moderate left pleural effusion decreased in size compared to the prior study. Some pleural  thickening and enhancement, compatible with reported malignant pleural effusion. This is associated with areas of passive atelectasis in the left lower lobe and inferior segment of the lingula. No definite suspicious pulmonary nodules or masses are noted. No acute consolidative airspace disease. Musculoskeletal: There are no aggressive appearing lytic or blastic lesions noted in the visualized portions of the skeleton. CT ABDOMEN PELVIS FINDINGS Hepatobiliary: No suspicious cystic or solid hepatic lesions. No intra or extrahepatic biliary ductal dilatation. Gallbladder is normal in appearance. Pancreas: No pancreatic mass. No pancreatic ductal dilatation. No pancreatic or peripancreatic fluid collections or inflammatory changes. Spleen: Unremarkable. Adrenals/Urinary Tract: Bilateral kidneys and the right adrenal gland are normal in appearance. In the lateral limb of the left adrenal gland there is a 1.6 x 1.6 cm enhancing nodule, which appears stable in size compared to prior CT examinations dating back to at least 2017, presumably a benign lesion such as an adenoma. Stomach/Bowel: Normal appearance of the stomach. No pathologic dilatation of small bowel or colon. Normal appendix. Vascular/Lymphatic: Aortic atherosclerosis, without evidence of aneurysm or dissection in the abdominal or pelvic vasculature. No lymphadenopathy noted in the abdomen or pelvis. Reproductive: Uterus and ovaries are unremarkable in appearance. Other: Fat stranding and soft tissue attenuation in the region of the omentum, best appreciated anteriorly on axial images 86-90 of series 2, concerning for omental caking. No significant volume of ascites. No pneumoperitoneum. Musculoskeletal: There are no aggressive appearing lytic or blastic lesions noted in the visualized portions of the skeleton. IMPRESSION: 1. Moderate left malignant pleural effusion, decreased in size compared to the prior examination. 2. Omental caking highly concerning for  intraperitoneal metastatic disease. 3. No definite primary malignancy confidently identified on today's examination. 4. Stable nodule in the lateral limb of the left adrenal gland dating back to at least 2017, presumably a benign lesions such as an adrenal adenoma. 5. Aortic atherosclerosis. 6. Additional incidental  findings, as above. Electronically Signed   By: Vinnie Langton M.D.   On: 04/25/2020 10:41   NM PET Image Initial (PI) Skull Base To Thigh  Result Date: 02/26/2020 CLINICAL DATA:  Initial treatment strategy for malignant pleural effusion. EXAM: NUCLEAR MEDICINE PET SKULL BASE TO THIGH TECHNIQUE: 12.83 mCi F-18 FDG was injected intravenously. Full-ring PET imaging was performed from the skull base to thigh after the radiotracer. CT data was obtained and used for attenuation correction and anatomic localization. Fasting blood glucose: 91 mg/dl COMPARISON:  None. FINDINGS: Mediastinal blood pool activity: SUV max 3.16 Liver activity: SUV max NA NECK: There is symmetric radiotracer uptake noted within the base of tongue and bilateral tonsillar regions with SUV max of 7.49. No FDG avid lymph nodes identified within the soft tissues of the neck. Incidental CT findings: none CHEST: No FDG avid axillary or supraclavicular lymph nodes. No FDG avid mediastinal or hilar lymph nodes. Known malignant, loculated left pleural effusion is noted with overlying compressive type atelectasis. This has decreased in volume when compared with pre thoracentesis chest CT dated 02/17/2020. There is a thin rind of mildly FDG avid soft tissue overlying the left lung compatible with malignant pleural effusion. The corresponding SUV max ranges between 3.25 and 2.83. There is a trace amount of right pleural fluid which also exhibits mild FDG uptake within SUV max of 3.15, image 93/3. No suspicious or FDG avid pulmonary nodule or mass identified. Incidental CT findings: Mild aortic atherosclerosis. ABDOMEN/PELVIS: There is no  abnormal FDG uptake within the liver, pancreas, spleen, or adrenal glands. Left adrenal nodule measures 1.4 cm and does not exhibit any significant tracer uptake favoring a benign adenoma. No enlarged or FDG avid retroperitoneal or pelvic lymph nodes. FDG avid soft tissue infiltration of the omentum is identified compatible with peritoneal carcinomatosis. Focal area of increased omental soft tissue along the midline of the abdomen measures 4.7 x 2.2 cm and has an SUV max of 7.27. Additional small peritoneal nodules are noted scattered within the upper abdomen including on image 169/3 and image 153/3. No ascites identified. Within the right pelvis abutting the right side of the uterus is a fluid density structure which measures 4.3 x 2.7 cm within SUV max of 4.20. Incidental CT findings: Aortic atherosclerosis. SKELETON: No focal hypermetabolic activity to suggest skeletal metastasis. Incidental CT findings: none IMPRESSION: 1. Exam shows evidence of peritoneal disease within the abdomen including FDG avid soft tissue infiltration into the omentum. 2. Large, loculated left pleural effusion with thin, FDG avid rind of soft tissue overlying the left lung. This corresponds to known, pathology proven malignant pleural effusion. 3. Small right pleural effusion with mild FDG uptake is equivocal for malignant effusion of the right hemithorax. 4. Fluid density structure within right side of pelvis abutting the right-side of the uterus is noted. This is indeterminate. Further investigation with pelvic sonogram may be helpful. Electronically Signed   By: Kerby Moors M.D.   On: 02/26/2020 15:36   CT Biopsy  Result Date: 03/05/2020 INDICATION: 64 year old female with a history of FDG avid omental mass EXAM: CT BIOPSY MEDICATIONS: None. ANESTHESIA/SEDATION: Moderate (conscious) sedation was employed during this procedure. A total of Versed 2.0 mg and Fentanyl 100 mcg was administered intravenously. Moderate Sedation Time:  15 minutes. The patient's level of consciousness and vital signs were monitored continuously by radiology nursing throughout the procedure under my direct supervision. FLUOROSCOPY TIME:  CT COMPLICATIONS: None PROCEDURE: Informed written consent was obtained from the patient after a  thorough discussion of the procedural risks, benefits and alternatives. All questions were addressed. Maximal Sterile Barrier Technique was utilized including caps, mask, sterile gowns, sterile gloves, sterile drape, hand hygiene and skin antiseptic. A timeout was performed prior to the initiation of the procedure. Patient positioned supine position on the CT gantry table. Scout CT was acquired for planning purposes. Using CT guidance, 18 gauge needle was advanced into the omental mass from a tangential approach, with a safe, greater than 3 cm margin to the adjacent bowel. Multiple 18 gauge core biopsy were performed. Two separate devices were required given failure of the first device. Specimens placed into formalin. Needle was removed and a final CT was acquired. Patient tolerated the procedure well and remained hemodynamically stable throughout. No complications were encountered and no significant blood loss. IMPRESSION: Status post CT-guided omental mass biopsy. Signed, Dulcy Fanny. Dellia Nims, RPVI [facility] Electronically Signed   By: Corrie Mckusick D.O.   On: 03/05/2020 11:34   DG Chest Port 1 View  Result Date: 02/19/2020 CLINICAL DATA:  Status post left thoracentesis EXAM: PORTABLE CHEST 1 VIEW COMPARISON:  02/18/2020 FINDINGS: Large left pleural effusion has been partially evacuated. Small residual left pleural effusion persists. Mild left basilar atelectasis results in mild left-sided volume loss. No pneumothorax. Right lung is clear. Cardiac size within normal limits. No acute bone abnormality. IMPRESSION: No pneumothorax following left thoracentesis. Small residual  left pleural effusion. Electronically Signed   By: Fidela Salisbury MD   On: 02/19/2020 17:03   DG Chest Port 1 View  Result Date: 02/18/2020 CLINICAL DATA:  Status post left thoracentesis EXAM: PORTABLE CHEST 1 VIEW COMPARISON:  02/17/2020 FINDINGS: Large left pleural effusion remains, slightly decreased since prior study. No pneumothorax following thoracentesis. Left lower lobe atelectasis. No focal opacity on the right. Heart is normal size. IMPRESSION: Large left pleural effusion remains following thoracentesis. No pneumothorax. Electronically Signed   By: Rolm Baptise M.D.   On: 02/18/2020 12:18   DG Chest Port 1 View  Result Date: 02/17/2020 CLINICAL DATA:  Chest pain and shortness of breath EXAM: PORTABLE CHEST 1 VIEW COMPARISON:  05/23/2016, 10/11/2019, 01/09/2020 FINDINGS: Single frontal view of the chest demonstrates interval opacification of the lower 2/3 of the left hemithorax, consistent with effusion and consolidation. Right chest is clear. No pneumothorax. No acute bony abnormalities. Chronic Hill-Sachs deformity and degenerative changes of the right shoulder. IMPRESSION: 1. Interval development of significant left lung consolidation and/or effusion. Electronically Signed   By: Randa Ngo M.D.   On: 02/17/2020 18:03   ECHOCARDIOGRAM COMPLETE  Result Date: 02/19/2020    ECHOCARDIOGRAM REPORT   Patient Name:   JOVANKA WESTGATE Date of Exam: 02/18/2020 Medical Rec #:  [id number]         Height:       63.0 in Accession #:    [id number]        Weight:       229.3 lb Date of Birth:  08/18/1955         BSA:          2.049 m Patient Age:    41 years          BP:           114/69 mmHg Patient Gender: F                 HR:           87 bpm. Exam Location:  [facility] Procedure: 2D Echo, Cardiac Doppler and Color Doppler Indications:     Chest Pain 786.50  History:         Patient has no prior history of Echocardiogram examinations.                  HLD.  Sonographer:     Alyse Low Roar Referring Phys:   [id number] Bonnielee Haff Diagnosing Phys: Nelva Bush MD IMPRESSIONS  1. Left ventricular ejection fraction, by estimation, is 65 to 70%. The left ventricle has normal function. Left ventricular endocardial border not optimally defined to evaluate regional wall motion. There is mild left ventricular hypertrophy. Left ventricular diastolic parameters are consistent with Grade I diastolic dysfunction (impaired relaxation).  2. Right ventricular systolic function is normal. The right ventricular size is normal. Tricuspid regurgitation signal is inadequate for assessing PA pressure.  3. The mitral valve was not well visualized. No evidence of mitral valve regurgitation. No evidence of mitral stenosis.  4. The aortic valve was not well visualized. Aortic valve regurgitation is not visualized. No aortic stenosis is present. FINDINGS  Left Ventricle: Left ventricular ejection fraction, by estimation, is 65 to 70%. The left ventricle has normal function. Left ventricular endocardial border not optimally defined to evaluate regional wall motion. The left ventricular internal cavity size was normal in size. There is mild left ventricular hypertrophy. Left ventricular diastolic parameters are consistent with Grade I diastolic dysfunction (impaired relaxation). Right Ventricle: The right ventricular size is normal. No increase in right ventricular wall thickness. Right ventricular systolic function is normal. Tricuspid regurgitation signal is inadequate for assessing PA pressure. Left Atrium: Left atrial size was normal in size. Right Atrium: Right atrial size was normal in size. Pericardium: The pericardium was not well visualized. Mitral Valve: The mitral valve was not well visualized. No evidence of mitral valve regurgitation. No evidence of mitral valve stenosis. Tricuspid Valve: The tricuspid valve is not well visualized. Tricuspid valve regurgitation is trivial. Aortic Valve: The aortic valve was not well visualized.  Aortic valve regurgitation is not visualized. No aortic stenosis is present. Aortic valve mean gradient measures 6.0 mmHg. Aortic valve peak gradient measures 13.5 mmHg. Aortic valve area, by VTI measures 2.16 cm. Pulmonic Valve: The pulmonic valve was not well visualized. Pulmonic valve regurgitation is not visualized. No evidence of pulmonic stenosis. Aorta: The aortic root is normal in size and structure. Pulmonary Artery: The pulmonary artery is not well seen. Venous: The inferior vena cava was not well visualized. IAS/Shunts: The interatrial septum was not well visualized.  LEFT VENTRICLE PLAX 2D LVIDd:         4.25 cm  Diastology LVIDs:         2.95 cm  LV e' lateral:   7.29 cm/s LV PW:         1.09 cm  LV E/e' lateral: 10.0 LV IVS:        1.09 cm  LV e' medial:    7.18 cm/s LVOT diam:     1.70 cm  LV E/e' medial:  10.2 LV SV:         52 LV SV Index:   25 LVOT Area:     2.27 cm  RIGHT VENTRICLE RV S prime:     14.10 cm/s LEFT ATRIUM             Index       RIGHT ATRIUM          Index LA diam:  3.60 cm 1.76 cm/m  RA Area:     8.68 cm LA Vol (A2C):   36.9 ml 18.01 ml/m RA Volume:   14.90 ml 7.27 ml/m LA Vol (A4C):   27.1 ml 13.22 ml/m LA Biplane Vol: 31.7 ml 15.47 ml/m  AORTIC VALVE                    PULMONIC VALVE AV Area (Vmax):    2.06 cm     PV Vmax:        0.92 m/s AV Area (Vmean):   2.07 cm     PV Peak grad:   3.4 mmHg AV Area (VTI):     2.16 cm     RVOT Peak grad: 4 mmHg AV Vmax:           184.00 cm/s AV Vmean:          104.000 cm/s AV VTI:            0.238 m AV Peak Grad:      13.5 mmHg AV Mean Grad:      6.0 mmHg LVOT Vmax:         167.00 cm/s LVOT Vmean:        94.900 cm/s LVOT VTI:          0.227 m LVOT/AV VTI ratio: 0.95  AORTA Ao Root diam: 2.40 cm MITRAL VALVE MV Area (PHT): 5.13 cm     SHUNTS MV Decel Time: 148 msec     Systemic VTI:  0.23 m MV E velocity: 73.10 cm/s   Systemic Diam: 1.70 cm MV A velocity: 109.00 cm/s MV E/A ratio:  0.67 MV A Prime:    16.4 cm/s Nelva Bush  MD Electronically signed by Nelva Bush MD Signature Date/Time: 02/19/2020/1:00:30 PM    Final    US SOFT TISSUE UPPER EXTREMITY LIMITED LEFT (NON-VASCULAR)  Result Date: 04/30/2020 CLINICAL DATA:  IV infiltration 2 weeks ago. Evaluate for cutaneous abscess. EXAM: ULTRASOUND LEFT UPPER EXTREMITY LIMITED TECHNIQUE: Ultrasound examination of the upper extremity soft tissues was performed in the area of clinical concern. COMPARISON:  None. FINDINGS: Examination is targeted to the proximal posterior forearm. There is mild superficial edema in this area, but no focal fluid collection. There is a small tubular structure in this area which is likely a superficial vein. This demonstrates no internal blood flow or compression, suspicious for superficial thrombophlebitis. IMPRESSION: 1. No evidence of abscess. Mild soft tissue edema in the area of concern. 2. Possible superficial thrombophlebitis. Electronically Signed   By: Richardean Sale M.D.   On: 04/30/2020 11:37   US PELVIC COMPLETE WITH TRANSVAGINAL  Result Date: 02/26/2020 CLINICAL DATA:  Malignant pleural effusion, abnormal PET-CT with a cystic structure in the RIGHT adnexa; postmenopausal EXAM: TRANSABDOMINAL AND TRANSVAGINAL ULTRASOUND OF PELVIS TECHNIQUE: Both transabdominal and transvaginal ultrasound examinations of the pelvis were performed. Transabdominal technique was performed for global imaging of the pelvis including uterus, ovaries, adnexal regions, and pelvic cul-de-sac. It was necessary to proceed with endovaginal exam following the transabdominal exam to visualize the uterus, endometrium, and ovaries. COMPARISON:  PET-CT 01/26/2020 FINDINGS: Uterus Measurements: 6.2 x 2.7 x 3.5 cm = volume: 30 mL. Anteverted. Heterogeneous myometrium. No focal mass Endometrium Thickness: 4 mm. Poorly defined. No definite mass or endometrial fluid Right ovary Questionable visualization of RIGHT ovary see below Left ovary Measurements: 1.9 x 0.9 x 1.1 cm =  volume: 0.4 mL. Normal morphology without mass Other findings Trace free pelvic fluid. Question cystic  lesions seen on prior PET-CT is not definitely visualized. A small focus of soft tissue is identified in the RIGHT adnexa 2.0 x 1.1 x 1.0 cm, suspect representing a small RIGHT ovary. IMPRESSION: Heterogeneous myometrium with poor definition of endometrial complex. Normal LEFT ovary. No cystic RIGHT adnexal lesion is identified, though a probable small normal appearing RIGHT ovary is identified. Suspect that the low-attenuation presume cystic structure seen in the RIGHT adnexa on the prior CT corresponds to a small amount of nonspecific free fluid in the RIGHT adnexa. Electronically Signed   By: Lavonia Dana M.D.   On: 02/26/2020 17:43   US THORACENTESIS ASP PLEURAL SPACE W/IMG GUIDE  Result Date: 02/19/2020 INDICATION: Prior smoker with history of dyspnea and recurrent malignant left pleural effusion. Request made for therapeutic left thoracentesis. EXAM: ULTRASOUND GUIDED THERAPEUTIC LEFT THORACENTESIS MEDICATIONS: 1% lidocaine to skin and subcutaneous tissue COMPLICATIONS: None immediate. PROCEDURE: An ultrasound guided thoracentesis was thoroughly discussed with the patient and questions answered. The benefits, risks, alternatives and complications were also discussed. The patient understands and wishes to proceed with the procedure. Written consent was obtained. Ultrasound was performed to localize and mark an adequate pocket of fluid in the left chest. The area was then prepped and draped in the normal sterile fashion. 1% Lidocaine was used for local anesthesia. Under ultrasound guidance a 6 Fr Safe-T-Centesis catheter was introduced. Thoracentesis was performed. The catheter was removed and a dressing applied. FINDINGS: A total of approximately 1.2 liters of bloody fluid was removed. Due to patient coughing and chest discomfort only the above amount of fluid was removed today. IMPRESSION: Successful  ultrasound guided therapeutic left thoracentesis yielding 1.2 liters of pleural fluid. Read by: Rowe Robert, PA-C Electronically Signed   By: Aletta Edouard M.D.   On: 02/19/2020 16:38   US THORACENTESIS ASP PLEURAL SPACE W/IMG GUIDE  Result Date: 02/18/2020 INDICATION: Patient with history of hyperlipidemia and elective right shoulder surgery performed on February 12, 2020. Patient presents to this facility with shortness of breath and chest pain found to have a pleural effusion. Request is for therapeutic and diagnostic left-sided thoracentesis EXAM: ULTRASOUND GUIDED LEFT THERAPEUTIC AND DIAGNOSTIC THORACENTESIS MEDICATIONS: Lidocaine 1% 10 mL. COMPLICATIONS: None immediate. Patient unable to tolerate additional fluid removal at this time. PROCEDURE: An ultrasound guided thoracentesis was thoroughly discussed with the patient and questions answered. The benefits, risks, alternatives and complications were also discussed. The patient understands and wishes to proceed with the procedure. Written consent was obtained. Ultrasound was performed to localize and mark an adequate pocket of fluid in the left chest. The area was then prepped and draped in the normal sterile fashion. 1% Lidocaine was used for local anesthesia. Under ultrasound guidance a 6 Fr Safe-T-Centesis catheter was introduced. Thoracentesis was performed. The catheter was removed and a dressing applied. FINDINGS: A total of approximately 1.8 L of serosanguineous fluid was removed. Samples were sent to the laboratory as requested by the clinical team. IMPRESSION: Successful ultrasound guided left-sided therapeutic and diagnostic thoracentesis yielding 1.8 L of pleural fluid. Read by: Rushie Nyhan, NP Electronically Signed   By: Markus Daft M.D.   On: 02/18/2020 12:38     Assessment and plan Patient is a 64 y.o. female presents for follow-up of pleural effusion 1. 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 Malignant pleural effusion   3.  Encounter for antineoplastic chemotherapy   4. 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 CT images were independently reviewed and discussed with patient.  Left pleural effusion has decreased in size.  Omental caking.  Stable adrenal nodule which is likely benign process. Labs reviewed and discussed with patient. Counts acceptable to proceed with cycle 4 chemotherapy treatment today. Obtain CT of the chest in 2 weeks and patient to follow-up with gynecology oncology for further discussion of the plan of debulking surgery.   # No germline BRCA mutation, -Homologous recombination deficiency positive.  Myriad Genomic instability score:  positive, somatic BRCA1 positive.  Future PARP inhibitor maintenance  #Left malignant pleural effusion, status post thoracentesis in the past. Moderate left malignant pleural effusion, decreasing size comparing to prior examination.  She is asymptomatic.    # pre-existing neuropathy, grade 1, continue gabapentin. #Left forearm skin erythema/swelling, possible abscess versus superficial thrombus phlebitis.  Continue to finish the course of doxycycline.  Symptoms have improved. Recommend patient to apply warm compress. We spent sufficient time to discuss many aspect of care, questions were answered to patient's satisfaction.  Follow-up to be determined.  Patient will follow up with me after her debulking surgery for additional chemotherapy treatments.   Earlie Server, MD, PhD Hematology Oncology [facility] at [facility] Pager- [phone] 05/06/2020

## 2020-05-07 LAB — CA 125: Cancer Antigen (CA) 125: 5.8 U/mL (ref 0.0–38.1)

## 2020-05-08 ENCOUNTER — Telehealth: Payer: Self-pay

## 2020-05-08 NOTE — Telephone Encounter (Signed)
Received message to call Linda Schmitt regarding a conflict in her surgery that is date for 10/28 and a pulmonary appointment also on 10/28. Called and left her a voicemail to reschedule her pulmonary appointment. This is a 3 month follow up.  Requested to see if she can have this appointment prior to her surgery date. Encouraged to call with any further questions.

## 2020-05-08 NOTE — Telephone Encounter (Signed)
    MA9/30/2021   Name: Linda Schmitt   MRN: [id number]   DOB: 24-Sep-1955   AGE: 64 y.o.   GENDER: female   PCP Burnard Hawthorne, FNP.   05/08/20 Spoke with patient she has called resources given for financial assistance and is waiting on a call back.  Will follow up with patient in a few days.    Thurl Boen, AAS Paralegal, [place] . Embedded Care Coordination [facility]  Care Management  [address], [place] [address] [email or web address]  [phone]   [email or web address]

## 2020-05-13 ENCOUNTER — Telehealth: Payer: Self-pay | Admitting: *Deleted

## 2020-05-13 ENCOUNTER — Telehealth: Payer: Self-pay

## 2020-05-13 NOTE — Telephone Encounter (Signed)
Please ask her if she is taking garbapentin 100mg  TID. During last visit, she was not taking. If she is not taking, please advise her to resume taking this dose. If she is already on current dose, recommend her to increase dose to 200mg  three times daily. Thanks.

## 2020-05-13 NOTE — Telephone Encounter (Signed)
Patient called reporting that she is having tingling in her feet and in her fingers as well. She states that her feet feel cold to her , but warm to touch. This started Saturday. Sometimes painful. Asking what to do about

## 2020-05-13 NOTE — Telephone Encounter (Signed)
    MA10/12/2019 2nd Follow-up call  Name: Linda Schmitt   MRN: [id number]   DOB: Jan 30, 1956   AGE: 64 y.o.   GENDER: female   PCP Burnard Hawthorne, FNP.   05/13/20 Spoke with patient she has called all resources given and has received calls back and is filling out applications for assistance.  There are no other resources needed at this time.  Closing referral.    Nery Frappier, AAS Paralegal, [place] . Embedded Care Coordination [facility]  Care Management  [address],  [address] [email or web address]  [phone]   [email or web address]

## 2020-05-14 NOTE — Telephone Encounter (Signed)
Call returned to patient, I asked if she is taking the Gabapentin three times a day and she told me that she has only been taking it twice a day. I advised her to take it three times a day and if no improvement to let us know an dwe would increase her dose to 200 mg three times a day. She stated "Alright, thank you for calling me back"

## 2020-05-19 ENCOUNTER — Encounter: Payer: Self-pay | Admitting: Obstetrics and Gynecology

## 2020-05-20 ENCOUNTER — Ambulatory Visit
Admission: RE | Admit: 2020-05-20 | Discharge: 2020-05-20 | Disposition: A | Discharge status: Home / Self Care | Payer: Medicare Other | Source: Ambulatory Visit | Attending: Obstetrics and Gynecology | Admitting: Obstetrics and Gynecology

## 2020-05-20 ENCOUNTER — Other Ambulatory Visit: Payer: Self-pay

## 2020-05-20 DIAGNOSIS — C763 Malignant neoplasm of pelvis: Secondary | ICD-10-CM | POA: Insufficient documentation

## 2020-05-20 MED ORDER — IOHEXOL 300 MG/ML  SOLN
75.0000 mL | Freq: Once | INTRAMUSCULAR | Status: AC | PRN
  Administered 2020-05-20: 75 mL via INTRAVENOUS

## 2020-06-05 DIAGNOSIS — C541 Malignant neoplasm of endometrium: Secondary | ICD-10-CM | POA: Insufficient documentation

## 2020-06-05 HISTORY — PX: ROBOTIC ASSISTED TOTAL HYSTERECTOMY WITH BILATERAL SALPINGO OOPHERECTOMY: SHX6086

## 2020-06-12 ENCOUNTER — Telehealth: Payer: Self-pay

## 2020-06-12 NOTE — Telephone Encounter (Signed)
Received call from Linda Schmitt in need of "support documents" to secure her job. Her email from [employer] does have an attachment but she cannot forward it to me and she is not driving due to recent surgery. I have reached out to her human resources, at her request, to try and obtain this needed support document.

## 2020-06-13 ENCOUNTER — Telehealth: Payer: Self-pay

## 2020-06-13 NOTE — Telephone Encounter (Signed)
FMLA forms have been completed and faxed to [phone], as provided, with confirmation of receipt.

## 2020-06-19 ENCOUNTER — Other Ambulatory Visit: Payer: Self-pay | Admitting: Family

## 2020-06-19 DIAGNOSIS — E785 Hyperlipidemia, unspecified: Secondary | ICD-10-CM

## 2020-06-23 ENCOUNTER — Ambulatory Visit: Payer: Medicare Other | Admitting: Family

## 2020-06-25 ENCOUNTER — Telehealth: Payer: Self-pay | Admitting: *Deleted

## 2020-06-25 ENCOUNTER — Inpatient Hospital Stay: Payer: Medicare Other | Attending: Obstetrics and Gynecology | Admitting: Obstetrics and Gynecology

## 2020-06-25 VITALS — BP 132/74 | HR 105 | Temp 97.8°F | Resp 20 | Wt 230.0 lb

## 2020-06-25 DIAGNOSIS — C5700 Malignant neoplasm of unspecified fallopian tube: Secondary | ICD-10-CM

## 2020-06-25 DIAGNOSIS — E785 Hyperlipidemia, unspecified: Secondary | ICD-10-CM | POA: Diagnosis not present

## 2020-06-25 DIAGNOSIS — Z90722 Acquired absence of ovaries, bilateral: Secondary | ICD-10-CM | POA: Insufficient documentation

## 2020-06-25 DIAGNOSIS — Z6841 Body Mass Index (BMI) 40.0 and over, adult: Secondary | ICD-10-CM | POA: Insufficient documentation

## 2020-06-25 DIAGNOSIS — Z7901 Long term (current) use of anticoagulants: Secondary | ICD-10-CM | POA: Insufficient documentation

## 2020-06-25 DIAGNOSIS — Z79899 Other long term (current) drug therapy: Secondary | ICD-10-CM | POA: Diagnosis not present

## 2020-06-25 DIAGNOSIS — Z90710 Acquired absence of both cervix and uterus: Secondary | ICD-10-CM | POA: Insufficient documentation

## 2020-06-25 DIAGNOSIS — I700 Atherosclerosis of aorta: Secondary | ICD-10-CM | POA: Diagnosis not present

## 2020-06-25 DIAGNOSIS — F32A Depression, unspecified: Secondary | ICD-10-CM | POA: Diagnosis not present

## 2020-06-25 DIAGNOSIS — J910 Malignant pleural effusion: Secondary | ICD-10-CM

## 2020-06-25 DIAGNOSIS — Z9221 Personal history of antineoplastic chemotherapy: Secondary | ICD-10-CM | POA: Diagnosis not present

## 2020-06-25 DIAGNOSIS — Z7189 Other specified counseling: Secondary | ICD-10-CM

## 2020-06-25 DIAGNOSIS — Z148 Genetic carrier of other disease: Secondary | ICD-10-CM | POA: Diagnosis not present

## 2020-06-25 DIAGNOSIS — C786 Secondary malignant neoplasm of retroperitoneum and peritoneum: Secondary | ICD-10-CM

## 2020-06-25 DIAGNOSIS — Z87891 Personal history of nicotine dependence: Secondary | ICD-10-CM | POA: Diagnosis not present

## 2020-06-25 MED ORDER — ENOXAPARIN SODIUM 40 MG/0.4ML ~~LOC~~ SOLN: 40.0000 mg | SUBCUTANEOUS | 0 refills | Status: DC

## 2020-06-25 NOTE — Progress Notes (Signed)
Gynecologic Oncology Interval Visit   Referring Provider: Dr. 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 G1P1 female, initially seen in consultation from Dr. 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   On 06/05/2020 she underwent Exam under anesthesia, Diagnostic laparoscopy, Robot-assisted total laparoscopic hysterectomy, Bilateral salpingo-oophorectomy, Omentectomy via mini-laparotomy/hand assisted port, and Optimal (R<1) interval tumor debulking. Pathology c/w tubal origin.   Pathology:  DIAGNOSIS  A. Peritoneal nodule, excisional biopsy: Fibroadipose tissue with calcifications, negative for tumor.  B. Uterus, bilateral ovaries and fallopian tubes, hysterectomy and bilateral salpingo-oophorectomy: Cervix: Squamous metaplasia. Endometrium: Inactive endometrium. Myometrium: Adenomyosis. Serosa: High grade serous adenocarcinoma. Right ovary: High grade serous adenocarcinoma, involving the ovarian surface and parenchyma. Right fallopian tube: No specific pathologic change. Left ovary:High grade serous adenocarcinoma, involving the ovarian surface and parenchyma. Left fallopian tube: High grade serous adenocarcinoma, involving tubal fimbria. Serous tubal intraepithelial carcinoma.  Immunohistochemistry was performed on block B14 at [facility] to characterize the pathologic process and demonstrates the following immunophenotype in the cells of interest: POSITIVE:  p53, p16 (strong, patchy), Ki67 (elevated)  C. Peritoneal nodule, left pericolic gutter, excisional biopsy: High grade serous adenocarcinoma. D. Cul-de-sac, excisional biopsy:High grade serous adenocarcinoma. E. Omentum, excision: High grade serous adenocarcinoma (up to  3.0 cm).         OVARY or FALLOPIAN TUBE or PRIMARY PERITONEUM 8th Edition - Protocol posted: 10/04/2018 OVARY OR FALLOPIAN TUBE OR PRIMARY PERITONEUM - All Specimens SPECIMEN  Procedure  Total hysterectomy and bilateral salpingo-oophorectomy   Hysterectomy Type  Laparoscopic, robotic-assisted       Specimen Integrity of the Left Fallopian Tube  Serosa intact   TUMOR  Tumor Site  Left fallopian tube   Histologic Type  Serous carcinoma   Histologic Grade  High grade   Tumor Size  Greatest Dimension (Centimeters): 0.2 cm  Ovarian Surface Involvement  Present   Laterality  Bilateral   Fallopian Tube Surface Involvement  Present   Laterality  Left   Implants  Present (sites): left peritoneum, cul-de-sac, omentum   Other Tissue / Organ Involvement  Right ovary     Left ovary     Left fallopian tube     Pelvic peritoneum     Omentum   Largest Extrapelvic Peritoneal Focus  Macroscopic (greater than 2 cm)   Peritoneal / Ascitic Fluid  Not submitted / unknown   Pleural Fluid  Not submitted / unknown   Treatment Effect  Moderate response identified (CRS 2)      She is taking her lovenox injections and requested two more days worth of treatment. She made an error with the syringes.   Gynecologic Oncology History:  Linda Schmitt is a 64 y.o. female, initially seen in consultation from Dr. 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 Her history is as follows:  She presented with malignant pleural effusion two weeks after elective right shoulder surgery. She underwent thoracentesis on 7/12 and 7/13 with 1.8L and 1.2L removed respectively. Cytology was positive for PAX 8. PET scan showed evidence of peritoneal disease within the abdomen, soft tissue infiltrating into the omentum, large loculated left pleural effusion with thin FDG avid rind of soft tissue overlying the left lung. Small right pleural effusion with mild FDG uptake is equivocal for  malignant effusion of  the right hemithorax. Fluid density structure within right side of pelvis abutting the right sided uterus noted though indeterminate. Pelvic u/s obtained showed heterogeneous myometrium with poor definition of endometrial complex. Normal left ovary.  No cystic right adnexal lesion is identified.  No a probable small normal-appearing right ovary is identified.  Suspect that the low-attenuation presumed cystic structure seen in the right adnexa on the prior image corresponds to a small amount of nonspecific free fluid in the right adnexa.  Biopsy of omental mass on 03/05/20  DIAGNOSIS:  A. OMENTAL MASS; CT-GUIDED BIOPSY:  - HIGH-GRADE SEROUS CARCINOMA.   Given that cytology from left pleural effusion is positive for metastatic carcinoma, tumor cells are positive for PAX 8 which indicates GYN origin.  Given that patient has moderate to large amount of pleural effusion, heavy tumor burden, recommend to start chemotherapy as soon as possible with carboplatin AUC 5-6 and paclitaxel 175 mg/m every 3 weeks for 3 cycles.  Patient will establish care with gynecology oncology for evaluation of participating in clinical trials after neoadjuvant chemo/surgery.  Treatment Summary: 03/04/20 Cycle 1 carboplatin-paclitaxel 03/25/20 Cycle 2 carboplatin-paclitaxel 04/15/20  Cycle 3 carboplatin-paclitaxel  04/25/20 CT C/A/P revealed moderate left malignant pleural effusion with some pleural thickening and enhancement which was decreased in size compared to prior exam. Omental caking apparent concerning for intraperitoneal metastatic disease. No definite primary malignancy identified. Stable left adrenal nodule. Aortic atherosclerosis.   CA 125 has normalized to 10.5. CA 125 was 53.6 at time of diagnosis.      GENETIC TEST RESULTS:   Germline Testing: 04/07/20- negative. No clinically significant mutation identified. Breast Cancer Riskscore remaining life time risk- 7%.   Somatic Testing: Genetic testing reported out  on 04/08/2020 through the [facility] + [facility] HRD. No pathogenic variants identified on the [facility] (germline) panel. Pathogenic variant identified through [employer] (HRD testing) in BRCA1 called [id number], HRD positive.   The [facility] gene panel offered by [employer] includes sequencing and deletion/duplication testing of the following 35 genes: APC, ATM, AXIN2, BARD1, BMPR1A, BRCA1, BRCA2, BRIP1, CHD1, CDK4, CDKN2A, CHEK2, EPCAM (large rearrangement only), HOXB13, GALNT12, MLH1, MSH2, MSH3, MSH6, MUTYH, NBN, NTHL1, PALB2, PMS2, PTEN, RAD51C, RAD51D, RNF43, RPS20, SMAD4, STK11, and TP53. Sequencing was performed for select regions of POLE and POLD1, and large rearrangement analysis was performed for select regions of GREM1.      Problem List: Patient Active Problem List   Diagnosis Date Noted  . 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 BRCA1 gene mutation positive 04/11/2020  . Genetic testing 04/09/2020  . Serous carcinoma of female pelvis ([place]) 03/20/2020  . Family history of breast cancer   . Encounter for antineoplastic chemotherapy 03/04/2020  . Neuropathy 03/04/2020  . Mass of omentum 02/28/2020  . Goals of care, counseling/discussion 02/26/2020  . Metastatic carcinoma ([place]) 02/26/2020  . Status post thoracentesis   . Malignant pleural effusion   . Pleural effusion 02/17/2020  . Mass of arm, right 01/22/2020  . Elevated blood pressure reading 12/28/2019  . Hand tingling 12/28/2019  . Acute pain of right shoulder 12/01/2018  . Adrenal adenoma, left 11/11/2017  . Atherosclerosis of aorta ([place]) 09/23/2017  . Prediabetes 07/09/2017  . Tendonitis of wrist, right 07/06/2017  . Hyperlipidemia 10/11/2016  . Former smoker 09/21/2016  . Depression, recurrent ([place]) 09/14/2016  . Headache 09/14/2016  . Routine general medical examination at a health care facility 01/14/2015  . Screening for breast cancer 11/12/2013  Past Medical History: Past  Medical History:  Diagnosis Date  . Family history of breast cancer   . Genetic testing 04/09/2020   No pathogenic variants identified on the [employer] [facility] (germline) panel. The report date is 04/07/2020.   The [facility] gene panel offered by [employer] includes sequencing and deletion/duplication testing of the following 35 genes: APC, ATM, AXIN2, BARD1, BMPR1A, BRCA1, BRCA2, BRIP1, CHD1, CDK4, CDKN2A, CHEK2, EPCAM (large rearrangement only), HOXB13, GALNT12, MLH1, MSH2, MSH3, MSH6  . HLD (hyperlipidemia)     Past Surgical History: Past Surgical History:  Procedure Laterality Date  . CARPAL TUNNEL RELEASE Left   . ROBOTIC ASSISTED TOTAL HYSTERECTOMY WITH BILATERAL SALPINGO OOPHERECTOMY  06/05/2020   with omentectomy tumor debulking via mini-laparotomy/hand assisted port  . SHOULDER ARTHROSCOPY WITH SUBACROMIAL DECOMPRESSION AND OPEN ROTATOR C Right 02/01/2020   Procedure: RIGHT SHOULDER ARTHROSCOPIC SUBSCAPULARIS REPAIR, ROTATOR CUFF REPAIR, SUABCROMIAL DECOMPRESSION, AND BICEPS TENODESIS.;  Surgeon: Leim Fabry, MD;  Location: [facility] ORS;  Service: Orthopedics;  Laterality: Right;  . TUBAL LIGATION      Past Gynecologic History: as per HPI   OB History:  OB History  Gravida Para Term Preterm AB Living  1 1       1   SAB TAB Ectopic Multiple Live Births               # Outcome Date GA Lbr Len/2nd Weight Sex Delivery Anes PTL Lv  1 Para             Family History: Family History  Problem Relation Age of Onset  . Cancer Mother        lymphoma  . Stroke Sister   . Cancer Brother        lung  . Cancer Sister        lung  . Lung cancer Brother   . Bone cancer Sister   . Breast cancer Cousin 73       maternal  . Cancer Maternal Aunt        unk types  Family history significant for lung cancer, lymphoma, and breast cancer.   Social History: Social History   Socioeconomic History  . Marital status: Single    Spouse name: Not on file  . Number of children:  Not on file  . Years of education: Not on file  . Highest education level: Not on file  Occupational History  . Not on file  Tobacco Use  . Smoking status: Former Smoker    Packs/day: 0.75    Years: 40.00    Pack years: 30.00    Types: Cigarettes    Quit date: 02/06/2018    Years since quitting: 2.3  . Smokeless tobacco: Never Used  . Tobacco comment: quit 02/2018  Vaping Use  . Vaping Use: Never used  Substance and Sexual Activity  . Alcohol use: Yes    Comment: 2-3 glasses of wine per week  . Drug use: No  . Sexual activity: Not on file  Other Topics Concern  . Not on file  Social History Narrative   Lives in [place] alone. Work - [employer]   Diet: Regular   Exercise: walking   Social Determinants of Radio broadcast assistant Strain:   . Difficulty of Paying Living Expenses: Not on file  Food Insecurity: No Food Insecurity  . Worried About Charity fundraiser in the Last Year: Never true  . Ran Out of Food in the Last Year: Never true  Transportation  Needs: No Transportation Needs  . Lack of Transportation (Medical): No  . Lack of Transportation (Non-Medical): No  Physical Activity:   . Days of Exercise per Week: Not on file  . Minutes of Exercise per Session: Not on file  Stress: No Stress Concern Present  . Feeling of Stress : Not at all  Social Connections:   . Frequency of Communication with Friends and Family: Not on file  . Frequency of Social Gatherings with Friends and Family: Not on file  . Attends Religious Services: Not on file  . Active Member of Clubs or Organizations: Not on file  . Attends Archivist Meetings: Not on file  . Marital Status: Not on file  Intimate Partner Violence: Not At Risk  . Fear of Current or Ex-Partner: No  . Emotionally Abused: No  . Physically Abused: No  . Sexually Abused: No    Allergies: No Known Allergies  Current Medications: Current Outpatient Medications  Medication Sig Dispense Refill  .  acetaminophen (TYLENOL) 500 MG tablet Take 2 tablets (1,000 mg total) by mouth every 8 (eight) hours. 90 tablet 2  . cholecalciferol (QC VITAMIN D3) 10 MCG (400 UNIT) TABS tablet Take 800 Units by mouth daily.    . cyanocobalamin 1000 MCG tablet Take 400 mcg by mouth daily.    Marland Kitchen enoxaparin (LOVENOX) 40 MG/0.4ML injection Inject 0.4 mLs (40 mg total) into the skin daily for 2 days. 0.8 mL 0  . Fluticasone-Umeclidin-Vilant (TRELEGY ELLIPTA) 100-62.5-25 MCG/INH AEPB INHALE 1 PUFF INTO THE LUNGS EVERY DAY    . gabapentin (NEURONTIN) 100 MG capsule Take 1 capsule (100 mg total) by mouth 3 (three) times daily. 90 capsule 3  . oxyCODONE (ROXICODONE) 5 MG immediate release tablet Take 1-2 tablets (5-10 mg total) by mouth every 4 (four) hours as needed (pain). 30 tablet 0  . pravastatin (PRAVACHOL) 40 MG tablet TAKE 1 TABLET(40 MG) BY MOUTH DAILY 90 tablet 0  . ondansetron (ZOFRAN) 8 MG tablet Take 1 tablet (8 mg total) by mouth 2 (two) times daily as needed for refractory nausea / vomiting. Start on day 3 after carboplatin chemo. (Patient not taking: Reported on 04/15/2020) 30 tablet 1  . prochlorperazine (COMPAZINE) 10 MG tablet Take 1 tablet (10 mg total) by mouth every 6 (six) hours as needed (Nausea or vomiting). (Patient not taking: Reported on 04/15/2020) 30 tablet 1   No current facility-administered medications for this visit.    Review of Systems General:  no complaints Skin: no complaints Eyes: no complaints HEENT: no complaints Breasts: no complaints Pulmonary: no complaints Cardiac: no complaints Gastrointestinal: no complaints Genitourinary/Sexual: no complaints Ob/Gyn: no complaints Musculoskeletal: no complaints Hematology: no complaints Neurologic/Psych: no complaints   Objective:  Physical Examination:  BP 132/74   Pulse (!) 105   Temp 97.8 F (36.6 C)   Resp 20   Wt 230 lb (104.3 kg)   SpO2 100%   BMI 40.74 kg/m     ECOG Performance Status: 1 - Symptomatic but  completely ambulatory  GENERAL: Patient is a well appearing female in no acute distress HEENT: atraumatic normocephalic ABDOMEN:  Soft, nontender, nondistended. No ascites, hernia, or masses. Bruising due to lovenox.  EXTREMITIES:  No peripheral edema.   SKIN:  Incisions healing well closed dry and intact NEURO:  Nonfocal. Well oriented.  Appropriate affect.  Pelvic: deferred  Lab Review Labs pending  Radiologic Imaging: As per prior notes        Assessment:  Linda Laprise Schmitt  is a 64 y.o. female diagnosed with metastatic high grade serous Fallopian tube cancer (BRCA1 somatic mutation; HRD) s/p NACT with paclitaxel/carboplatin s/p 4 cycles and MIS optimal interval debulking on 06/05/2020. Excellent postoperative recovery.   Pleural effusion, asymptomatic  Medical co-morbidities complicating care: Body mass index is 40.74 kg/m. 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   Reviewed medication list and removed Lasix and EMLA which she has not taken. I recommended leaving the anti-emetics on her list as she is going to be restarting chemotherapy. We also provided two additional doses of lovenox.   Check B12 level.   Follow up with Dr. Tasia Catchings on Monday with plan for chemotherapy on Tuesday 07/01/2020.   A total of at least 30 minutes were spent with the patient/family today; >50% was spent in education, counseling and coordination of care for Fallopian tube cancer.   Hammad Finkler Gaetana Michaelis, MD

## 2020-06-25 NOTE — Telephone Encounter (Signed)
Pt has been sched as requested. A detailed message was left on her VM making her aware of her sched appt for lab/MD on 11/22 and to RTC on 11/23 for her 5.5hrs Carbo/Taxol Infusion.Marland KitchenMarland Kitchen

## 2020-06-30 ENCOUNTER — Inpatient Hospital Stay: Payer: Medicare Other

## 2020-06-30 ENCOUNTER — Encounter: Payer: Self-pay | Admitting: Oncology

## 2020-06-30 ENCOUNTER — Inpatient Hospital Stay (HOSPITAL_BASED_OUTPATIENT_CLINIC_OR_DEPARTMENT_OTHER): Payer: Medicare Other | Admitting: Oncology

## 2020-06-30 VITALS — BP 143/78 | HR 71 | Temp 96.9°F | Resp 16 | Wt 232.3 lb

## 2020-06-30 DIAGNOSIS — C763 Malignant neoplasm of pelvis: Secondary | ICD-10-CM

## 2020-06-30 DIAGNOSIS — J910 Malignant pleural effusion: Secondary | ICD-10-CM

## 2020-06-30 DIAGNOSIS — G629 Polyneuropathy, unspecified: Secondary | ICD-10-CM | POA: Diagnosis not present

## 2020-06-30 DIAGNOSIS — C5700 Malignant neoplasm of unspecified fallopian tube: Secondary | ICD-10-CM

## 2020-06-30 DIAGNOSIS — D649 Anemia, unspecified: Secondary | ICD-10-CM | POA: Insufficient documentation

## 2020-06-30 DIAGNOSIS — Z5111 Encounter for antineoplastic chemotherapy: Secondary | ICD-10-CM

## 2020-06-30 DIAGNOSIS — Z7189 Other specified counseling: Secondary | ICD-10-CM

## 2020-06-30 LAB — CBC WITH DIFFERENTIAL/PLATELET
Abs Immature Granulocytes: 0.01 10*3/uL (ref 0.00–0.07)
Basophils Absolute: 0 10*3/uL (ref 0.0–0.1)
Basophils Relative: 0 %
Eosinophils Absolute: 0.2 10*3/uL (ref 0.0–0.5)
Eosinophils Relative: 4 %
HCT: 32.7 % — ABNORMAL LOW (ref 36.0–46.0)
Hemoglobin: 10.8 g/dL — ABNORMAL LOW (ref 12.0–15.0)
Immature Granulocytes: 0 %
Lymphocytes Relative: 32 %
Lymphs Abs: 1.7 10*3/uL (ref 0.7–4.0)
MCH: 30.6 pg (ref 26.0–34.0)
MCHC: 33 g/dL (ref 30.0–36.0)
MCV: 92.6 fL (ref 80.0–100.0)
Monocytes Absolute: 0.6 10*3/uL (ref 0.1–1.0)
Monocytes Relative: 12 %
Neutro Abs: 2.9 10*3/uL (ref 1.7–7.7)
Neutrophils Relative %: 52 %
Platelets: 190 10*3/uL (ref 150–400)
RBC: 3.53 MIL/uL — ABNORMAL LOW (ref 3.87–5.11)
RDW: 16.1 % — ABNORMAL HIGH (ref 11.5–15.5)
WBC: 5.4 10*3/uL (ref 4.0–10.5)
nRBC: 0 % (ref 0.0–0.2)

## 2020-06-30 LAB — COMPREHENSIVE METABOLIC PANEL
ALT: 17 U/L (ref 0–44)
AST: 18 U/L (ref 15–41)
Albumin: 4 g/dL (ref 3.5–5.0)
Alkaline Phosphatase: 37 U/L — ABNORMAL LOW (ref 38–126)
Anion gap: 10 (ref 5–15)
BUN: 13 mg/dL (ref 8–23)
CO2: 27 mmol/L (ref 22–32)
Calcium: 9.4 mg/dL (ref 8.9–10.3)
Chloride: 102 mmol/L (ref 98–111)
Creatinine, Ser: 0.82 mg/dL (ref 0.44–1.00)
GFR, Estimated: >60 mL/min (ref >60)
Glucose, Bld: 107 mg/dL — ABNORMAL HIGH (ref 70–99)
Potassium: 3.7 mmol/L (ref 3.5–5.1)
Sodium: 139 mmol/L (ref 135–145)
Total Bilirubin: 0.6 mg/dL (ref 0.3–1.2)
Total Protein: 7.2 g/dL (ref 6.5–8.1)

## 2020-06-30 LAB — VITAMIN B12: Vitamin B-12: 681 pg/mL (ref 180–914)

## 2020-06-30 MED ORDER — GABAPENTIN 300 MG PO CAPS: 300.0000 mg | ORAL_CAPSULE | Freq: Two times a day (BID) | ORAL | 0 refills | Status: DC

## 2020-06-30 NOTE — Progress Notes (Signed)
Pt here for follow up. No new concerns voiced.   

## 2020-06-30 NOTE — Addendum Note (Signed)
Addended by: Earlie Server on: 06/30/2020 09:09 PM   Modules accepted: Orders

## 2020-06-30 NOTE — Progress Notes (Addendum)
Hematology/Oncology Follow Up Note [facility]  [phone] [phone]  Patient Care Team: Burnard Hawthorne, FNP as PCP - General (Family Medicine) Clent Jacks, RN as Oncology Nurse Navigator   Name of the patient: Linda Schmitt  [id number]  14-Jun-1956   REASON FOR VISIT  follow-up for serous carcinoma  PERTINENT ONCOLOGY HISTORY Linda Schmitt is a 64 y.o.afemale who has above oncology history reviewed by me today presented for follow up visit for management of malignant pleural effusion status post thoracentesis x2 during her recent admission. Cytology was positive for metastatic carcinoma.  TTF-1 Napsin A, GATA3, CDX2 were negative. Positive for PAX 8, which is most often expressing tumors of gynecological and renal origin. PET scan was independently reviewed by me and discussed with patient.  I also discussed with radiology. Exam showed evidence of peritoneal disease within the abdomen, soft tissue infiltrating into the omentum.  Large loculated left pleural effusion with thin FDG avid rind of soft tissue overlying the left lung.  Small right pleural effusion with mild FDG uptake is equivocal for malignant effusion of the right hemithorax. Fluid density structure within the right side of the pelvis abutting the right-sided of uterus is noted.  This is indeterminate.  Further investigation with pelvic sonogram may be helpful.  Pelvic ultrasound was obtained.-Heterogeneous myometrium with poor definition of endometrial complex. Normal left ovary.  No cystic right adnexal lesion is identified.  No a probable small normal-appearing right ovary is identified.  Suspect that the low-attenuation presumed cystic structure seen in the right adnexa on the prior image corresponds to a small amount of nonspecific free fluid in the right adnexa. I discussed with gynecology oncology Dr.Secord.  Given that cytology from left pleural effusion is  positive for metastatic carcinoma, tumor cells are positive for PAX 8 which indicates GYN origin.  Given that patient has moderate to large amount of pleural effusion, heavy tumor burden, recommend to start chemotherapy as soon as possible with carboplatin AUC 5-6 and Taxol 175 mg/m every 3 weeks for 3 cycles.   #Pre-existing neuropathy of left hand, she takes gabapentin. # seen by Dr. Theora Gianotti on 04/30/2020 and she recommends to proceed with the fourth cycle of chemotherapy and patient will be seen by Dr. Theora Gianotti again on 05/27/2020 for debulking surgery plan on 06/05/2020 with cardiothoracic surgery for thoracoscopy possible thoracic tumor debulking.  Patient was recommended to have to repeat CT of the chest after her chemotherapy.  Oncologic chemotherapy treatments 03/04/2020-carbo and Taxol 03/25/2020-carbo and Taxol 04/15/2020-carbo and Taxol 05/06/2020-carbotaxol  06/05/2020 FIGO IIIC Ovarian cancer.  positive, somatic BRCA1 positive.  HRD.   INTERVAL HISTORY 64 year old female presents for follow-up of pleural effusion, metastatic serous carcinoma Patient is status post 4 cycles of carboplatin and Taxol treatment.   06/05/2020, status post Total hysterectomy and bilateral salpingo-oophorectomy, Omentectomy, and Optimal (R<1) interval tumor debulking. A. Peritoneal nodule, excisional biopsy: Fibroadipose tissue with calcifications, negative for tumor. B. Uterus, bilateral ovaries and fallopian tubes, hysterectomy and bilateral salpingo-oophorectomy: Cervix: Squamous metaplasia. Endometrium: Inactive endometrium. Myometrium: Adenomyosis. Serosa: High grade serous adenocarcinoma. Right ovary: High grade serous adenocarcinoma, involving the ovarian surface and parenchyma. Right fallopian tube: No specific pathologic change. Left ovary: High grade serous adenocarcinoma, involving the ovarian surface and parenchyma. Left fallopian tube: High grade serous adenocarcinoma, involving tubal  fimbria. Serous tubal intraepithelial carcinoma. Immunohistochemistry was performed on block B14 at [facility] to characterize the pathologic process and demonstrates the following immunophenotype in the cells of interest:POSITIVE:  p53, p16 (strong, patchy), Ki67 (elevated) This staining profile supports the above diagnosis. C. Peritoneal nodule, left pericolic gutter, excisional biopsy: High grade serous adenocarcinoma. D. Cul-de-sac, excisional biopsy: High grade serous adenocarcinoma. E. Omentum, excision: High grade serous adenocarcinoma (up to 3.0 cm).  TUMOR  Tumor Site:  Left fallopian tube  Histologic Type:  Serous carcinoma  Histologic Grade:  High grade  Tumor Size:  Greatest Dimension (Centimeters): 0.2 cm  Ovarian Surface Involvement:  Present   Laterality:  Bilateral  Fallopian Tube Surface Involvement:  Present   Laterality:  Left  Implants:  Present (sites): left peritoneum, cul-de-sac, omentum  Other Tissue / Organ Involvement:  Right ovary  Other Tissue / Organ Involvement:  Left ovary  Other Tissue / Organ Involvement:  Left fallopian tube  Other Tissue / Organ Involvement:  Pelvic peritoneum  Other Tissue / Organ Involvement:  Omentum  Largest Extrapelvic Peritoneal Focus:  Macroscopic (greater than 2 cm)  Peritoneal / Ascitic Fluid:  Not submitted / unknown  Pleural Fluid:  Not submitted / unknown  Treatment Effect:  Moderate response identified (CRS 2)   LYMPH NODES  Regional Lymph Nodes:  No lymph nodes submitted or found   PATHOLOGIC STAGE CLASSIFICATION (pTNM, AJCC 8th Edition)  TNM Descriptors:  y (post-treatment)  Primary Tumor (pT):  pT3c  Regional Lymph Nodes (pN):  pNX FIGO Stage:  IIIC   Patient was seen by Dr. Theora Gianotti postop last week and was recommended to proceed with additional 3 cycles of adjuvant chemotherapy with carboplatin and Taxol.  Today patient reports feeling well.   No new complaints.  No problem with wound healing.  Appetite is fair. She continues to have persistent neuropathy symptoms  Review of Systems  Constitutional: Negative for appetite change, chills, fatigue and fever.  HENT:   Negative for hearing loss and voice change.   Eyes: Negative for eye problems.  Respiratory: Negative for chest tightness and cough.   Cardiovascular: Negative for chest pain.  Gastrointestinal: Negative for abdominal distention, abdominal pain and blood in stool.  Endocrine: Negative for hot flashes.  Genitourinary: Negative for difficulty urinating and frequency.   Musculoskeletal: Negative for arthralgias.       Left upper extremity swelling and tenderness have improved.  Skin: Negative for itching and rash.  Neurological: Positive for numbness. Negative for extremity weakness.       Pre-existing neuropathy of left hand.  Hematological: Negative for adenopathy.  Psychiatric/Behavioral: Negative for confusion.      No Known Allergies   Past Medical History:  Diagnosis Date  . Family history of breast cancer   . Genetic testing 04/09/2020   No pathogenic variants identified on the Myriad MyRisk (germline) panel. The report date is 04/07/2020.   The [facility] gene panel offered by [employer] includes sequencing and deletion/duplication testing of the following 35 genes: APC, ATM, AXIN2, BARD1, BMPR1A, BRCA1, BRCA2, BRIP1, CHD1, CDK4, CDKN2A, CHEK2, EPCAM (large rearrangement only), HOXB13, GALNT12, MLH1, MSH2, MSH3, MSH6  . HLD (hyperlipidemia)      Past Surgical History:  Procedure Laterality Date  . CARPAL TUNNEL RELEASE Left   . ROBOTIC ASSISTED TOTAL HYSTERECTOMY WITH BILATERAL SALPINGO OOPHERECTOMY  06/05/2020   with omentectomy tumor debulking via mini-laparotomy/hand assisted port  . SHOULDER ARTHROSCOPY WITH SUBACROMIAL DECOMPRESSION AND OPEN ROTATOR C Right 02/01/2020   Procedure: RIGHT SHOULDER ARTHROSCOPIC SUBSCAPULARIS REPAIR,  ROTATOR CUFF REPAIR, SUABCROMIAL DECOMPRESSION, AND BICEPS TENODESIS.;  Surgeon: Leim Fabry, MD;  Location: [facility] ORS;  Service: Orthopedics;  Laterality: Right;  .  TUBAL LIGATION      Social History   Socioeconomic History  . Marital status: Single    Spouse name: Not on file  . Number of children: Not on file  . Years of education: Not on file  . Highest education level: Not on file  Occupational History  . Not on file  Tobacco Use  . Smoking status: Former Smoker    Packs/day: 0.75    Years: 40.00    Pack years: 30.00    Types: Cigarettes    Quit date: 02/06/2018    Years since quitting: 2.3  . Smokeless tobacco: Never Used  . Tobacco comment: quit 02/2018  Vaping Use  . Vaping Use: Never used  Substance and Sexual Activity  . Alcohol use: Yes    Comment: 2-3 glasses of wine per week  . Drug use: No  . Sexual activity: Not on file  Other Topics Concern  . Not on file  Social History Narrative   Lives in [place] alone. Work - [employer]   Diet: Regular   Exercise: walking   Social Determinants of Radio broadcast assistant Strain:   . Difficulty of Paying Living Expenses: Not on file  Food Insecurity: No Food Insecurity  . Worried About Charity fundraiser in the Last Year: Never true  . Ran Out of Food in the Last Year: Never true  Transportation Needs: No Transportation Needs  . Lack of Transportation (Medical): No  . Lack of Transportation (Non-Medical): No  Physical Activity:   . Days of Exercise per Week: Not on file  . Minutes of Exercise per Session: Not on file  Stress: No Stress Concern Present  . Feeling of Stress : Not at all  Social Connections:   . Frequency of Communication with Friends and Family: Not on file  . Frequency of Social Gatherings with Friends and Family: Not on file  . Attends Religious Services: Not on file  . Active Member of Clubs or Organizations: Not on file  . Attends Archivist Meetings: Not on file  . Marital  Status: Not on file  Intimate Partner Violence: Not At Risk  . Fear of Current or Ex-Partner: No  . Emotionally Abused: No  . Physically Abused: No  . Sexually Abused: No    Family History  Problem Relation Age of Onset  . Cancer Mother        lymphoma  . Stroke Sister   . Cancer Brother        lung  . Cancer Sister        lung  . Lung cancer Brother   . Bone cancer Sister   . Breast cancer Cousin 86       maternal  . Cancer Maternal Aunt        unk types     Current Outpatient Medications:  .  acetaminophen (TYLENOL) 500 MG tablet, Take 2 tablets (1,000 mg total) by mouth every 8 (eight) hours., Disp: 90 tablet, Rfl: 2 .  cholecalciferol (QC VITAMIN D3) 10 MCG (400 UNIT) TABS tablet, Take 800 Units by mouth daily., Disp: , Rfl:  .  cyanocobalamin 1000 MCG tablet, Take 400 mcg by mouth daily., Disp: , Rfl:  .  enoxaparin (LOVENOX) 40 MG/0.4ML injection, Inject 0.4 mLs (40 mg total) into the skin daily for 2 days., Disp: 0.8 mL, Rfl: 0 .  Fluticasone-Umeclidin-Vilant (TRELEGY ELLIPTA) 100-62.5-25 MCG/INH AEPB, INHALE 1 PUFF INTO THE LUNGS EVERY DAY, Disp: , Rfl:  .  gabapentin (NEURONTIN) 100 MG capsule, Take 1 capsule (100 mg total) by mouth 3 (three) times daily., Disp: 90 capsule, Rfl: 3 .  ondansetron (ZOFRAN) 8 MG tablet, Take 1 tablet (8 mg total) by mouth 2 (two) times daily as needed for refractory nausea / vomiting. Start on day 3 after carboplatin chemo. (Patient not taking: Reported on 04/15/2020), Disp: 30 tablet, Rfl: 1 .  oxyCODONE (ROXICODONE) 5 MG immediate release tablet, Take 1-2 tablets (5-10 mg total) by mouth every 4 (four) hours as needed (pain)., Disp: 30 tablet, Rfl: 0 .  pravastatin (PRAVACHOL) 40 MG tablet, TAKE 1 TABLET(40 MG) BY MOUTH DAILY, Disp: 90 tablet, Rfl: 0 .  prochlorperazine (COMPAZINE) 10 MG tablet, Take 1 tablet (10 mg total) by mouth every 6 (six) hours as needed (Nausea or vomiting). (Patient not taking: Reported on 04/15/2020), Disp: 30  tablet, Rfl: 1  Physical exam:  There were no vitals filed for this visit. Physical Exam Constitutional:      General: She is not in acute distress. HENT:     Head: Normocephalic and atraumatic.  Eyes:     General: No scleral icterus. Cardiovascular:     Rate and Rhythm: Normal rate and regular rhythm.     Heart sounds: Normal heart sounds.  Pulmonary:     Effort: Pulmonary effort is normal. No respiratory distress.     Breath sounds: No wheezing.     Comments: Diminished breathing sounds.  left lower 1/3  Abdominal:     General: Bowel sounds are normal. There is no distension.     Palpations: Abdomen is soft.  Musculoskeletal:        General: No deformity. Normal range of motion.     Cervical back: Normal range of motion and neck supple.  Skin:    General: Skin is warm and dry.     Findings: No erythema or rash.  Neurological:     Mental Status: She is alert and oriented to person, place, and time. Mental status is at baseline.     Cranial Nerves: No cranial nerve deficit.     Coordination: Coordination normal.  Psychiatric:        Mood and Affect: Mood normal.   L       CMP Latest Ref Rng & Units 05/06/2020  Glucose 70 - 99 mg/dL 112(H)  BUN 8 - 23 mg/dL 11  Creatinine 0.44 - 1.00 mg/dL 0.72  Sodium 135 - 145 mmol/L 139  Potassium 3.5 - 5.1 mmol/L 3.6  Chloride 98 - 111 mmol/L 103  CO2 22 - 32 mmol/L 24  Calcium 8.9 - 10.3 mg/dL 9.1  Total Protein 6.5 - 8.1 g/dL 7.1  Total Bilirubin 0.3 - 1.2 mg/dL 0.5  Alkaline Phos 38 - 126 U/L 46  AST 15 - 41 U/L 16  ALT 0 - 44 U/L 15   CBC Latest Ref Rng & Units 05/06/2020  WBC 4.0 - 10.5 K/uL 5.7  Hemoglobin 12.0 - 15.0 g/dL 10.3(L)  Hematocrit 36 - 46 % 32.1(L)  Platelets 150 - 400 K/uL 179    RADIOGRAPHIC STUDIES: I have personally reviewed the radiological images as listed and agreed with the findings in the report. CT Chest W Contrast  Result Date: 05/21/2020 CLINICAL DATA:  64 year old female with history  of ovarian cancer and pleural effusion. Preop evaluation for thoracoscopy. EXAM: CT CHEST WITH CONTRAST TECHNIQUE: Multidetector CT imaging of the chest was performed during intravenous contrast administration. CONTRAST:  77m OMNIPAQUE IOHEXOL 300 MG/ML  SOLN  COMPARISON:  CT dated 04/24/2020. FINDINGS: Cardiovascular: There is no cardiomegaly or pericardial effusion. Mild atherosclerotic calcification of the aortic arch. The thoracic aorta is unremarkable. The origins of the great vessels of the aortic arch patent. Evaluation of the pulmonary arteries is limited due to suboptimal opacification and timing of the contrast. No large central pulmonary artery embolus identified. Mediastinum/Nodes: No hilar or mediastinal adenopathy. The esophagus and the thyroid gland are grossly unremarkable. No mediastinal fluid collection. Lungs/Pleura: Small left pleural effusion similar to prior CT. Faint higher attenuation nodular thickening of the posterior left lower lobe pleural surface may be present. There is partial consolidative changes of the left lower lobe similar to prior CT which may represent atelectasis. There is no pneumothorax. The central airways are patent. Upper Abdomen: Stable 16 mm left adrenal nodule. The right adrenal gland is unremarkable. Musculoskeletal: Degenerative changes. No acute osseous pathology. No suspicious bone lesions. IMPRESSION: 1. Small left pleural effusion similar to prior CT. Overall no significant interval change compared to the prior CT. 2. Stable left adrenal nodule. 3. Aortic Atherosclerosis (ICD10-I70.0). Electronically Signed   By: Anner Crete M.D.   On: 05/21/2020 21:04   CT CHEST ABDOMEN PELVIS W CONTRAST  Result Date: 04/25/2020 CLINICAL DATA:  64 year old female with history of metastatic carcinoma with malignant pleural effusion. Follow-up study. EXAM: CT CHEST, ABDOMEN, AND PELVIS WITH CONTRAST TECHNIQUE: Multidetector CT imaging of the chest, abdomen and pelvis was  performed following the standard protocol during bolus administration of intravenous contrast. CONTRAST:  125m OMNIPAQUE IOHEXOL 300 MG/ML  SOLN COMPARISON:  Chest CT 02/17/2020. No prior CT the abdomen or pelvis. FINDINGS: CT CHEST FINDINGS Cardiovascular: Heart size is normal. There is no significant pericardial fluid, thickening or pericardial calcification. Aortic atherosclerosis. No definite coronary artery calcifications. Mediastinum/Nodes: No pathologically enlarged mediastinal or hilar lymph nodes. Esophagus is unremarkable in appearance. No axillary lymphadenopathy. Lungs/Pleura: Moderate left pleural effusion decreased in size compared to the prior study. Some pleural thickening and enhancement, compatible with reported malignant pleural effusion. This is associated with areas of passive atelectasis in the left lower lobe and inferior segment of the lingula. No definite suspicious pulmonary nodules or masses are noted. No acute consolidative airspace disease. Musculoskeletal: There are no aggressive appearing lytic or blastic lesions noted in the visualized portions of the skeleton. CT ABDOMEN PELVIS FINDINGS Hepatobiliary: No suspicious cystic or solid hepatic lesions. No intra or extrahepatic biliary ductal dilatation. Gallbladder is normal in appearance. Pancreas: No pancreatic mass. No pancreatic ductal dilatation. No pancreatic or peripancreatic fluid collections or inflammatory changes. Spleen: Unremarkable. Adrenals/Urinary Tract: Bilateral kidneys and the right adrenal gland are normal in appearance. In the lateral limb of the left adrenal gland there is a 1.6 x 1.6 cm enhancing nodule, which appears stable in size compared to prior CT examinations dating back to at least 2017, presumably a benign lesion such as an adenoma. Stomach/Bowel: Normal appearance of the stomach. No pathologic dilatation of small bowel or colon. Normal appendix. Vascular/Lymphatic: Aortic atherosclerosis, without evidence  of aneurysm or dissection in the abdominal or pelvic vasculature. No lymphadenopathy noted in the abdomen or pelvis. Reproductive: Uterus and ovaries are unremarkable in appearance. Other: Fat stranding and soft tissue attenuation in the region of the omentum, best appreciated anteriorly on axial images 86-90 of series 2, concerning for omental caking. No significant volume of ascites. No pneumoperitoneum. Musculoskeletal: There are no aggressive appearing lytic or blastic lesions noted in the visualized portions of the skeleton. IMPRESSION: 1. Moderate left malignant  pleural effusion, decreased in size compared to the prior examination. 2. Omental caking highly concerning for intraperitoneal metastatic disease. 3. No definite primary malignancy confidently identified on today's examination. 4. Stable nodule in the lateral limb of the left adrenal gland dating back to at least 2017, presumably a benign lesions such as an adrenal adenoma. 5. Aortic atherosclerosis. 6. Additional incidental findings, as above. Electronically Signed   By: Vinnie Langton M.D.   On: 04/25/2020 10:41   US SOFT TISSUE UPPER EXTREMITY LIMITED LEFT (NON-VASCULAR)  Result Date: 04/30/2020 CLINICAL DATA:  IV infiltration 2 weeks ago. Evaluate for cutaneous abscess. EXAM: ULTRASOUND LEFT UPPER EXTREMITY LIMITED TECHNIQUE: Ultrasound examination of the upper extremity soft tissues was performed in the area of clinical concern. COMPARISON:  None. FINDINGS: Examination is targeted to the proximal posterior forearm. There is mild superficial edema in this area, but no focal fluid collection. There is a small tubular structure in this area which is likely a superficial vein. This demonstrates no internal blood flow or compression, suspicious for superficial thrombophlebitis. IMPRESSION: 1. No evidence of abscess. Mild soft tissue edema in the area of concern. 2. Possible superficial thrombophlebitis. Electronically Signed   By: Richardean Sale  M.D.   On: 04/30/2020 11:37     Assessment and plan Patient is a 64 y.o. female presents for follow-up for FIGO 3C ovarian cancer. 1. Carcinoma of fallopian tube, unspecified laterality ([place])   2. Neuropathy   3. Normocytic anemia   4. Encounter for antineoplastic chemotherapy   5. Goals of care, counseling/discussion    #FIGO IIIC Fallopian tube serous carcinoma Status post 4 cycles of carboplatin AUC 5, Taxol 175 mg/m. S/p total hysterectomy with salpingo-oophorectomy and omentectomy. Optimal (R<1) interval tumor debulking. Pathology was reviewed and discussed with patient. Discussed with Dr. Theora Gianotti who recommends additional 3 cycles of adjuvant carboplatin and Taxol treatment. Labs reviewed and discussed with patient. Proceed with cycle 5 carboplatin AUC 5, Taxol 175 mg/m.  # HRD:  No germline BRCA mutation, -Homologous recombination deficiency positive.  Myriad Genomic instability score:  positive, somatic BRCA1 positive.  Patient will need future PARP inhibitor maintenance.  # pre-existing neuropathy, grade 2, worse at night.  Advised patient to increase gabapentin dose to 300 mg at bedtime.  She may take another 100-300 mg during the day.Marland Kitchen #Anemia, likely secondary to recent surgery.  Hemoglobin has improved.  Monitor.  We spent sufficient time to discuss many aspect of care, questions were answered to patient's satisfaction.  Follow-up in 3 weeks   Earlie Server, MD, PhD Hematology Oncology [facility] Pager- [phone] 06/30/2020

## 2020-07-01 ENCOUNTER — Other Ambulatory Visit: Payer: Self-pay

## 2020-07-01 ENCOUNTER — Inpatient Hospital Stay: Payer: Medicare Other

## 2020-07-01 ENCOUNTER — Telehealth: Payer: Self-pay | Admitting: Nurse Practitioner

## 2020-07-01 VITALS — BP 128/81 | HR 75 | Temp 96.7°F | Resp 16

## 2020-07-01 DIAGNOSIS — J910 Malignant pleural effusion: Secondary | ICD-10-CM

## 2020-07-01 DIAGNOSIS — C5700 Malignant neoplasm of unspecified fallopian tube: Secondary | ICD-10-CM | POA: Diagnosis not present

## 2020-07-01 LAB — CA 125: Cancer Antigen (CA) 125: 4.6 U/mL (ref 0.0–38.1)

## 2020-07-01 MED ORDER — SODIUM CHLORIDE 0.9 % IV SOLN
150.0000 mg | Freq: Once | INTRAVENOUS | Status: AC
  Administered 2020-07-01: 150 mg via INTRAVENOUS
  Filled 2020-07-01: qty 150

## 2020-07-01 MED ORDER — PALONOSETRON HCL INJECTION 0.25 MG/5ML
0.2500 mg | Freq: Once | INTRAVENOUS | Status: AC
  Administered 2020-07-01: 0.25 mg via INTRAVENOUS
  Filled 2020-07-01: qty 5

## 2020-07-01 MED ORDER — SODIUM CHLORIDE 0.9 % IV SOLN
Freq: Once | INTRAVENOUS | Status: AC
  Filled 2020-07-01: qty 250

## 2020-07-01 MED ORDER — SODIUM CHLORIDE 0.9 % IV SOLN
175.0000 mg/m2 | Freq: Once | INTRAVENOUS | Status: AC
  Administered 2020-07-01: 390 mg via INTRAVENOUS
  Filled 2020-07-01: qty 65

## 2020-07-01 MED ORDER — FAMOTIDINE IN NACL 20-0.9 MG/50ML-% IV SOLN
20.0000 mg | Freq: Once | INTRAVENOUS | Status: AC
  Administered 2020-07-01: 20 mg via INTRAVENOUS
  Filled 2020-07-01: qty 50

## 2020-07-01 MED ORDER — SODIUM CHLORIDE 0.9 % IV SOLN
10.0000 mg | Freq: Once | INTRAVENOUS | Status: AC
  Administered 2020-07-01: 10 mg via INTRAVENOUS
  Filled 2020-07-01: qty 10

## 2020-07-01 MED ORDER — DIPHENHYDRAMINE HCL 50 MG/ML IJ SOLN
50.0000 mg | Freq: Once | INTRAMUSCULAR | Status: AC
  Administered 2020-07-01: 50 mg via INTRAVENOUS
  Filled 2020-07-01: qty 1

## 2020-07-01 MED ORDER — SODIUM CHLORIDE 0.9 % IV SOLN
588.5000 mg | Freq: Once | INTRAVENOUS | Status: AC
  Administered 2020-07-01: 590 mg via INTRAVENOUS
  Filled 2020-07-01: qty 59

## 2020-07-01 NOTE — Telephone Encounter (Signed)
Called patient to reviewed results of B12 which was normal. She will continue b12 supplementation at current dose.

## 2020-07-14 ENCOUNTER — Ambulatory Visit (INDEPENDENT_AMBULATORY_CARE_PROVIDER_SITE_OTHER): Payer: Medicare Other | Admitting: Family

## 2020-07-14 ENCOUNTER — Encounter: Payer: Self-pay | Admitting: Family

## 2020-07-14 ENCOUNTER — Other Ambulatory Visit: Payer: Self-pay

## 2020-07-14 DIAGNOSIS — R030 Elevated blood-pressure reading, without diagnosis of hypertension: Secondary | ICD-10-CM

## 2020-07-14 DIAGNOSIS — I700 Atherosclerosis of aorta: Secondary | ICD-10-CM

## 2020-07-14 DIAGNOSIS — G629 Polyneuropathy, unspecified: Secondary | ICD-10-CM

## 2020-07-14 NOTE — Assessment & Plan Note (Signed)
Stable. Continue pravachol 40mg 

## 2020-07-14 NOTE — Progress Notes (Signed)
Subjective:    Patient ID: Linda Schmitt, female    DOB: 1955-11-27, 64 y.o.   MRN: [id number]  CC: Linda Schmitt is a 63 y.o. female who presents today for follow up.   HPI: Feels well today No concerns. No fatigue. No depression. Sleeping well.   Checks blood pressure at home and have been well controlled. Doesn't recall actual reading No cp, sob, cough.   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 improved with gabapentin 300 TID. No other side effects from chemotherapy.   HLD- compliant with pravachol Following with Dr Tasia Catchings for fallopian tube- currently doing chemotherapy, 3 rounds remaining.   06/05/20 Total hysterectomy, bilateral salpingo-oophorectomy, omentectomy , pathology consistent with tubal origin  Due colonoscopy, dexa, mammogram; she declines at this time until she has completed chemotherapy  HISTORY:  Past Medical History:  Diagnosis Date  . Family history of breast cancer   . Genetic testing 04/09/2020   No pathogenic variants identified on the [employer] My[facility] (germline) panel. The report date is 04/07/2020.   The [facility] gene panel offered by [employer] includes sequencing and deletion/duplication testing of the following 35 genes: APC, ATM, AXIN2, BARD1, BMPR1A, BRCA1, BRCA2, BRIP1, CHD1, CDK4, CDKN2A, CHEK2, EPCAM (large rearrangement only), HOXB13, GALNT12, MLH1, MSH2, MSH3, MSH6  . HLD (hyperlipidemia)    Past Surgical History:  Procedure Laterality Date  . CARPAL TUNNEL RELEASE Left   . ROBOTIC ASSISTED TOTAL HYSTERECTOMY WITH BILATERAL SALPINGO OOPHERECTOMY  06/05/2020   with omentectomy tumor debulking via mini-laparotomy/hand assisted port  . SHOULDER ARTHROSCOPY WITH SUBACROMIAL DECOMPRESSION AND OPEN ROTATOR C Right 02/01/2020   Procedure: RIGHT SHOULDER ARTHROSCOPIC SUBSCAPULARIS REPAIR, ROTATOR CUFF REPAIR, SUABCROMIAL DECOMPRESSION, AND BICEPS TENODESIS.;  Surgeon: Leim Fabry, MD;  Location: [facility] ORS;  Service: Orthopedics;   Laterality: Right;  . TUBAL LIGATION     Family History  Problem Relation Age of Onset  . Cancer Mother        lymphoma  . Stroke Sister   . Cancer Brother        lung  . Cancer Sister        lung  . Lung cancer Brother   . Bone cancer Sister   . Breast cancer Cousin 40       maternal  . Cancer Maternal Aunt        unk types    Allergies: Patient has no known allergies. Current Outpatient Medications on File Prior to Visit  Medication Sig Dispense Refill  . acetaminophen (TYLENOL) 500 MG tablet Take 2 tablets (1,000 mg total) by mouth every 8 (eight) hours. 90 tablet 2  . cholecalciferol (QC VITAMIN D3) 10 MCG (400 UNIT) TABS tablet Take 800 Units by mouth daily.    . cyanocobalamin 1000 MCG tablet Take 400 mcg by mouth daily.    Marland Kitchen gabapentin (NEURONTIN) 300 MG capsule Take 1 capsule (300 mg total) by mouth 2 (two) times daily. 60 capsule 0  . pravastatin (PRAVACHOL) 40 MG tablet TAKE 1 TABLET(40 MG) BY MOUTH DAILY 90 tablet 0   No current facility-administered medications on file prior to visit.    Social History   Tobacco Use  . Smoking status: Former Smoker    Packs/day: 0.75    Years: 40.00    Pack years: 30.00    Types: Cigarettes    Quit date: 02/06/2018    Years since quitting: 2.4  . Smokeless tobacco: Never Used  . Tobacco comment: quit 02/2018  Vaping Use  . Vaping  Use: Never used  Substance Use Topics  . Alcohol use: Yes    Comment: 2-3 glasses of wine per week  . Drug use: No    Review of Systems  Constitutional: Negative for chills and fever.  Respiratory: Negative for cough.   Cardiovascular: Negative for chest pain and palpitations.  Gastrointestinal: Negative for nausea and vomiting.  Neurological: Positive for numbness.      Objective:    BP 122/70   Pulse 81   Temp 98.4 F (36.9 C)   Ht _0  (1.6 m)   Wt 234 lb 12.8 oz (106.5 kg)   SpO2 99%   BMI 41.59 kg/m  BP Readings from Last 3 Encounters:  07/14/20 122/70  07/01/20 128/81    06/30/20 (!) 143/78   Wt Readings from Last 3 Encounters:  07/14/20 234 lb 12.8 oz (106.5 kg)  06/30/20 232 lb 4.8 oz (105.4 kg)  06/25/20 230 lb (104.3 kg)    Physical Exam     Assessment & Plan:   Problem List Items Addressed This Visit      Cardiovascular and Mediastinum   Atherosclerosis of aorta (HCC)    Stable. Continue pravachol 23m        Nervous and Auditory   Neuropathy    Stable. Continue gabapentin, b12.         Other   Elevated blood pressure reading    Acceptable blood pressure reading today. Will follow          I have discontinued CDanny Lawless Arvizu's oxyCODONE, ondansetron, prochlorperazine, Trelegy Ellipta, and enoxaparin. I am also having her maintain her cholecalciferol, acetaminophen, pravastatin, cyanocobalamin, and gabapentin.   No orders of the defined types were placed in this encounter.   Return precautions given.   Risks, benefits, and alternatives of the medications and treatment plan prescribed today were discussed, and patient expressed understanding.   Education regarding symptom management and diagnosis given to patient on AVS.  Continue to follow with ABurnard Hawthorne FNP for routine health maintenance.   CBartowand I agreed with plan.   MMable Paris FNP

## 2020-07-14 NOTE — Assessment & Plan Note (Signed)
Acceptable blood pressure reading today. Will follow

## 2020-07-14 NOTE — Patient Instructions (Signed)
So very nice to see you!

## 2020-07-14 NOTE — Assessment & Plan Note (Signed)
Stable. Continue gabapentin, b12.

## 2020-07-22 ENCOUNTER — Other Ambulatory Visit: Payer: Self-pay

## 2020-07-22 ENCOUNTER — Inpatient Hospital Stay: Payer: Medicare Other

## 2020-07-22 ENCOUNTER — Encounter: Payer: Self-pay | Admitting: Oncology

## 2020-07-22 ENCOUNTER — Telehealth: Payer: Self-pay | Admitting: *Deleted

## 2020-07-22 ENCOUNTER — Inpatient Hospital Stay: Payer: Medicare Other | Attending: Oncology | Admitting: Oncology

## 2020-07-22 VITALS — BP 139/85 | HR 97 | Temp 98.3°F | Resp 16 | Wt 234.5 lb

## 2020-07-22 DIAGNOSIS — G629 Polyneuropathy, unspecified: Secondary | ICD-10-CM | POA: Diagnosis not present

## 2020-07-22 DIAGNOSIS — C5702 Malignant neoplasm of left fallopian tube: Secondary | ICD-10-CM | POA: Diagnosis present

## 2020-07-22 DIAGNOSIS — Z90722 Acquired absence of ovaries, bilateral: Secondary | ICD-10-CM | POA: Insufficient documentation

## 2020-07-22 DIAGNOSIS — Z5111 Encounter for antineoplastic chemotherapy: Secondary | ICD-10-CM

## 2020-07-22 DIAGNOSIS — J910 Malignant pleural effusion: Secondary | ICD-10-CM

## 2020-07-22 DIAGNOSIS — Z87891 Personal history of nicotine dependence: Secondary | ICD-10-CM | POA: Insufficient documentation

## 2020-07-22 DIAGNOSIS — D649 Anemia, unspecified: Secondary | ICD-10-CM

## 2020-07-22 DIAGNOSIS — C5700 Malignant neoplasm of unspecified fallopian tube: Secondary | ICD-10-CM

## 2020-07-22 DIAGNOSIS — C786 Secondary malignant neoplasm of retroperitoneum and peritoneum: Secondary | ICD-10-CM | POA: Insufficient documentation

## 2020-07-22 DIAGNOSIS — Z79899 Other long term (current) drug therapy: Secondary | ICD-10-CM | POA: Insufficient documentation

## 2020-07-22 DIAGNOSIS — C763 Malignant neoplasm of pelvis: Secondary | ICD-10-CM

## 2020-07-22 DIAGNOSIS — D696 Thrombocytopenia, unspecified: Secondary | ICD-10-CM | POA: Diagnosis not present

## 2020-07-22 DIAGNOSIS — Z90710 Acquired absence of both cervix and uterus: Secondary | ICD-10-CM | POA: Insufficient documentation

## 2020-07-22 LAB — COMPREHENSIVE METABOLIC PANEL
ALT: 13 U/L (ref 0–44)
AST: 19 U/L (ref 15–41)
Albumin: 3.9 g/dL (ref 3.5–5.0)
Alkaline Phosphatase: 37 U/L — ABNORMAL LOW (ref 38–126)
Anion gap: 12 (ref 5–15)
BUN: 10 mg/dL (ref 8–23)
CO2: 23 mmol/L (ref 22–32)
Calcium: 9 mg/dL (ref 8.9–10.3)
Chloride: 102 mmol/L (ref 98–111)
Creatinine, Ser: 0.85 mg/dL (ref 0.44–1.00)
GFR, Estimated: >60 mL/min (ref >60)
Glucose, Bld: 144 mg/dL — ABNORMAL HIGH (ref 70–99)
Potassium: 3.4 mmol/L — ABNORMAL LOW (ref 3.5–5.1)
Sodium: 137 mmol/L (ref 135–145)
Total Bilirubin: 0.7 mg/dL (ref 0.3–1.2)
Total Protein: 6.9 g/dL (ref 6.5–8.1)

## 2020-07-22 LAB — CBC WITH DIFFERENTIAL/PLATELET
Abs Immature Granulocytes: 0.02 10*3/uL (ref 0.00–0.07)
Basophils Absolute: 0 10*3/uL (ref 0.0–0.1)
Basophils Relative: 0 %
Eosinophils Absolute: 0 10*3/uL (ref 0.0–0.5)
Eosinophils Relative: 1 %
HCT: 31.8 % — ABNORMAL LOW (ref 36.0–46.0)
Hemoglobin: 10.4 g/dL — ABNORMAL LOW (ref 12.0–15.0)
Immature Granulocytes: 0 %
Lymphocytes Relative: 31 %
Lymphs Abs: 1.6 10*3/uL (ref 0.7–4.0)
MCH: 30.1 pg (ref 26.0–34.0)
MCHC: 32.7 g/dL (ref 30.0–36.0)
MCV: 91.9 fL (ref 80.0–100.0)
Monocytes Absolute: 0.6 10*3/uL (ref 0.1–1.0)
Monocytes Relative: 12 %
Neutro Abs: 2.9 10*3/uL (ref 1.7–7.7)
Neutrophils Relative %: 56 %
Platelets: 137 10*3/uL — ABNORMAL LOW (ref 150–400)
RBC: 3.46 MIL/uL — ABNORMAL LOW (ref 3.87–5.11)
RDW: 14.8 % (ref 11.5–15.5)
WBC: 5.2 10*3/uL (ref 4.0–10.5)
nRBC: 0 % (ref 0.0–0.2)

## 2020-07-22 MED ORDER — SODIUM CHLORIDE 0.9 % IV SOLN
175.0000 mg/m2 | Freq: Once | INTRAVENOUS | Status: AC
  Administered 2020-07-22: 390 mg via INTRAVENOUS
  Filled 2020-07-22: qty 65

## 2020-07-22 MED ORDER — FAMOTIDINE IN NACL 20-0.9 MG/50ML-% IV SOLN
20.0000 mg | Freq: Once | INTRAVENOUS | Status: AC
  Administered 2020-07-22: 20 mg via INTRAVENOUS
  Filled 2020-07-22: qty 50

## 2020-07-22 MED ORDER — SODIUM CHLORIDE 0.9 % IV SOLN
Freq: Once | INTRAVENOUS | Status: AC
  Filled 2020-07-22: qty 250

## 2020-07-22 MED ORDER — DIPHENHYDRAMINE HCL 50 MG/ML IJ SOLN
50.0000 mg | Freq: Once | INTRAMUSCULAR | Status: AC
  Administered 2020-07-22: 50 mg via INTRAVENOUS
  Filled 2020-07-22: qty 1

## 2020-07-22 MED ORDER — SODIUM CHLORIDE 0.9 % IV SOLN
150.0000 mg | Freq: Once | INTRAVENOUS | Status: AC
  Administered 2020-07-22: 150 mg via INTRAVENOUS
  Filled 2020-07-22: qty 150

## 2020-07-22 MED ORDER — SODIUM CHLORIDE 0.9 % IV SOLN
10.0000 mg | Freq: Once | INTRAVENOUS | Status: AC
  Administered 2020-07-22: 10 mg via INTRAVENOUS
  Filled 2020-07-22: qty 10

## 2020-07-22 MED ORDER — POTASSIUM CHLORIDE CRYS ER 20 MEQ PO TBCR: 20.0000 meq | EXTENDED_RELEASE_TABLET | Freq: Every day | ORAL | 0 refills | Status: DC

## 2020-07-22 MED ORDER — SODIUM CHLORIDE 0.9 % IV SOLN
588.5000 mg | Freq: Once | INTRAVENOUS | Status: AC
Start: 2020-07-22 — End: 2020-07-22
  Administered 2020-07-22: 590 mg via INTRAVENOUS
  Filled 2020-07-22: qty 59

## 2020-07-22 MED ORDER — PALONOSETRON HCL INJECTION 0.25 MG/5ML
0.2500 mg | Freq: Once | INTRAVENOUS | Status: AC
  Administered 2020-07-22: 0.25 mg via INTRAVENOUS
  Filled 2020-07-22: qty 5

## 2020-07-22 NOTE — Telephone Encounter (Signed)
Pharmacy called to confirm that only 2 potassium tabs are needed on prescription sent today. Please advise

## 2020-07-22 NOTE — Progress Notes (Signed)
Pt tolerated infusion well. Pt stable at discharge. 

## 2020-07-22 NOTE — Telephone Encounter (Signed)
Pharmacy informed that #2 is correct per secure chat with Dr Tasia Catchings

## 2020-07-22 NOTE — Progress Notes (Signed)
Hematology/Oncology Follow Up Note [facility]  [phone] [phone]  Patient Care Team: Burnard Hawthorne, FNP as PCP - General (Family Medicine) Clent Jacks, RN as Oncology Nurse Navigator   Name of the patient: Linda Schmitt  [id number]  22-Oct-1955   REASON FOR VISIT  follow-up for serous carcinoma  PERTINENT ONCOLOGY HISTORY Linda Schmitt is a 64 y.o.afemale who has above oncology history reviewed by me today presented for follow up visit for management of malignant pleural effusion status post thoracentesis x2 during her recent admission. Cytology was positive for metastatic carcinoma.  TTF-1 Napsin A, GATA3, CDX2 were negative. Positive for PAX 8, which is most often expressing tumors of gynecological and renal origin. PET scan was independently reviewed by me and discussed with patient.  I also discussed with radiology. Exam showed evidence of peritoneal disease within the abdomen, soft tissue infiltrating into the omentum.  Large loculated left pleural effusion with thin FDG avid rind of soft tissue overlying the left lung.  Small right pleural effusion with mild FDG uptake is equivocal for malignant effusion of the right hemithorax. Fluid density structure within the right side of the pelvis abutting the right-sided of uterus is noted.  This is indeterminate.  Further investigation with pelvic sonogram may be helpful.  Pelvic ultrasound was obtained.-Heterogeneous myometrium with poor definition of endometrial complex. Normal left ovary.  No cystic right adnexal lesion is identified.  No a probable small normal-appearing right ovary is identified.  Suspect that the low-attenuation presumed cystic structure seen in the right adnexa on the prior image corresponds to a small amount of nonspecific free fluid in the right adnexa. I discussed with gynecology oncology Dr.Secord.  Given that cytology from left pleural effusion is  positive for metastatic carcinoma, tumor cells are positive for PAX 8 which indicates GYN origin.  Given that patient has moderate to large amount of pleural effusion, heavy tumor burden, recommend to start chemotherapy as soon as possible with carboplatin AUC 5-6 and Taxol 175 mg/m every 3 weeks for 3 cycles.   #Pre-existing neuropathy of left hand, she takes gabapentin. # seen by Dr. Theora Gianotti on 04/30/2020 and she recommends to proceed with the fourth cycle of chemotherapy and patient will be seen by Dr. Theora Gianotti again on 05/27/2020 for debulking surgery plan on 06/05/2020 with cardiothoracic surgery for thoracoscopy possible thoracic tumor debulking.  Patient was recommended to have to repeat CT of the chest after her chemotherapy.  Oncologic chemotherapy treatments 03/04/2020-carbo and Taxol 03/25/2020-carbo and Taxol 04/15/2020-carbo and Taxol 05/06/2020-carbotaxol   06/05/2020, status post Total hysterectomy and bilateral salpingo-oophorectomy, Omentectomy, and Optimal (R<1) interval tumor debulking. A. Peritoneal nodule, excisional biopsy: Fibroadipose tissue with calcifications, negative for tumor. B. Uterus, bilateral ovaries and fallopian tubes, hysterectomy and bilateral salpingo-oophorectomy: Cervix: Squamous metaplasia. Endometrium: Inactive endometrium. Myometrium: Adenomyosis. Serosa: High grade serous adenocarcinoma. Right ovary: High grade serous adenocarcinoma, involving the ovarian surface and parenchyma. Right fallopian tube: No specific pathologic change. Left ovary: High grade serous adenocarcinoma, involving the ovarian surface and parenchyma. Left fallopian tube: High grade serous adenocarcinoma, involving tubal fimbria. Serous tubal intraepithelial carcinoma. Immunohistochemistry was performed on block B14 at [facility] to characterize the pathologic process and demonstrates the following immunophenotype in the cells of interest:POSITIVE:  p53, p16 (strong, patchy), Ki67  (elevated) This staining profile supports the above diagnosis. C. Peritoneal nodule, left pericolic gutter, excisional biopsy: High grade serous adenocarcinoma. D. Cul-de-sac, excisional biopsy: High grade serous adenocarcinoma. E. Omentum, excision: High grade  serous adenocarcinoma (up to 3.0 cm).  TUMOR  Tumor Site:  Left fallopian tube  Histologic Type:  Serous carcinoma  Histologic Grade:  High grade  Tumor Size:  Greatest Dimension (Centimeters): 0.2 cm  Ovarian Surface Involvement:  Present   Laterality:  Bilateral  Fallopian Tube Surface Involvement:  Present   Laterality:  Left  Implants:  Present (sites): left peritoneum, cul-de-sac, omentum  Other Tissue / Organ Involvement:  Right ovary  Other Tissue / Organ Involvement:  Left ovary  Other Tissue / Organ Involvement:  Left fallopian tube  Other Tissue / Organ Involvement:  Pelvic peritoneum  Other Tissue / Organ Involvement:  Omentum  Largest Extrapelvic Peritoneal Focus:  Macroscopic (greater than 2 cm)  Peritoneal / Ascitic Fluid:  Not submitted / unknown  Pleural Fluid:  Not submitted / unknown  Treatment Effect:  Moderate response identified (CRS 2)   LYMPH NODES  Regional Lymph Nodes:  No lymph nodes submitted or found   PATHOLOGIC STAGE CLASSIFICATION (pTNM, AJCC 8th Edition)  TNM Descriptors:  y (post-treatment)  Primary Tumor (pT):  pT3c  Regional Lymph Nodes (pN):  pNX FIGO Stage:  IIIC    INTERVAL HISTORY 65 year old female presents for follow-up of FIGO IIIC Ovarian cancer,  somatic BRCA1 positive and HRD positive.  07/01/2020, adjuvant chemotherapy, and Taxol.  She tolerates well. Slightly worse neuropathy.  She takes gabapentin 300 mg twice daily with some symptom relief. No nausea vomiting diarrhea.  Denies any shortness of breath or chest pain.  Review of Systems  Constitutional: Negative for appetite change, chills, fatigue  and fever.  HENT:   Negative for hearing loss and voice change.   Eyes: Negative for eye problems.  Respiratory: Negative for chest tightness and cough.   Cardiovascular: Negative for chest pain.  Gastrointestinal: Negative for abdominal distention, abdominal pain and blood in stool.  Endocrine: Negative for hot flashes.  Genitourinary: Negative for difficulty urinating and frequency.   Musculoskeletal: Negative for arthralgias.       Left upper extremity swelling and tenderness have improved.  Skin: Negative for itching and rash.  Neurological: Positive for numbness. Negative for extremity weakness.       Pre-existing neuropathy of left hand.  Hematological: Negative for adenopathy.  Psychiatric/Behavioral: Negative for confusion.      No Known Allergies   Past Medical History:  Diagnosis Date  . Family history of breast cancer   . Genetic testing 04/09/2020   No pathogenic variants identified on the Myriad MyRisk (germline) panel. The report date is 04/07/2020.   The [facility] gene panel offered by [employer] includes sequencing and deletion/duplication testing of the following 35 genes: APC, ATM, AXIN2, BARD1, BMPR1A, BRCA1, BRCA2, BRIP1, CHD1, CDK4, CDKN2A, CHEK2, EPCAM (large rearrangement only), HOXB13, GALNT12, MLH1, MSH2, MSH3, MSH6  . HLD (hyperlipidemia)      Past Surgical History:  Procedure Laterality Date  . CARPAL TUNNEL RELEASE Left   . ROBOTIC ASSISTED TOTAL HYSTERECTOMY WITH BILATERAL SALPINGO OOPHERECTOMY  06/05/2020   with omentectomy tumor debulking via mini-laparotomy/hand assisted port  . SHOULDER ARTHROSCOPY WITH SUBACROMIAL DECOMPRESSION AND OPEN ROTATOR C Right 02/01/2020   Procedure: RIGHT SHOULDER ARTHROSCOPIC SUBSCAPULARIS REPAIR, ROTATOR CUFF REPAIR, SUABCROMIAL DECOMPRESSION, AND BICEPS TENODESIS.;  Surgeon: Leim Fabry, MD;  Location: [facility] ORS;  Service: Orthopedics;  Laterality: Right;  . TUBAL LIGATION      Social History    Socioeconomic History  . Marital status: Single    Spouse name: Not on file  . Number of  children: Not on file  . Years of education: Not on file  . Highest education level: Not on file  Occupational History  . Not on file  Tobacco Use  . Smoking status: Former Smoker    Packs/day: 0.75    Years: 40.00    Pack years: 30.00    Types: Cigarettes    Quit date: 02/06/2018    Years since quitting: 2.4  . Smokeless tobacco: Never Used  . Tobacco comment: quit 02/2018  Vaping Use  . Vaping Use: Never used  Substance and Sexual Activity  . Alcohol use: Yes    Comment: 2-3 glasses of wine per week  . Drug use: No  . Sexual activity: Not on file  Other Topics Concern  . Not on file  Social History Narrative   Lives in [place] alone. Work - [employer]   Diet: Regular   Exercise: walking   Social Determinants of Radio broadcast assistant Strain: Not on file  Food Insecurity: No Landscape architect  . Worried About Charity fundraiser in the Last Year: Never true  . Ran Out of Food in the Last Year: Never true  Transportation Needs: No Transportation Needs  . Lack of Transportation (Medical): No  . Lack of Transportation (Non-Medical): No  Physical Activity: Not on file  Stress: No Stress Concern Present  . Feeling of Stress : Not at all  Social Connections: Not on file  Intimate Partner Violence: Not At Risk  . Fear of Current or Ex-Partner: No  . Emotionally Abused: No  . Physically Abused: No  . Sexually Abused: No    Family History  Problem Relation Age of Onset  . Cancer Mother        lymphoma  . Stroke Sister   . Cancer Brother        lung  . Cancer Sister        lung  . Lung cancer Brother   . Bone cancer Sister   . Breast cancer Cousin 68       maternal  . Cancer Maternal Aunt        unk types     Current Outpatient Medications:  .  acetaminophen (TYLENOL) 500 MG tablet, Take 2 tablets (1,000 mg total) by mouth every 8 (eight) hours., Disp: 90 tablet,  Rfl: 2 .  cholecalciferol (VITAMIN D3) 10 MCG (400 UNIT) TABS tablet, Take 800 Units by mouth daily., Disp: , Rfl:  .  cyanocobalamin 1000 MCG tablet, Take 400 mcg by mouth daily., Disp: , Rfl:  .  gabapentin (NEURONTIN) 300 MG capsule, Take 1 capsule (300 mg total) by mouth 2 (two) times daily., Disp: 60 capsule, Rfl: 0 .  pravastatin (PRAVACHOL) 40 MG tablet, TAKE 1 TABLET(40 MG) BY MOUTH DAILY, Disp: 90 tablet, Rfl: 0 .  potassium chloride SA (KLOR-CON) 20 MEQ tablet, Take 1 tablet (20 mEq total) by mouth daily., Disp: 2 tablet, Rfl: 0 No current facility-administered medications for this visit.  Facility-Administered Medications Ordered in Other Visits:  .  CARBOplatin (PARAPLATIN) 590 mg in sodium chloride 0.9 % 250 mL chemo infusion, 590 mg, Intravenous, Once, Earlie Server, MD .  PACLitaxel (TAXOL) 390 mg in sodium chloride 0.9 % 500 mL chemo infusion (> 63m/m2), 175 mg/m2 (Treatment Plan Recorded), Intravenous, Once, YEarlie Server MD, Last Rate: 200 mL/hr at 07/22/20 1236, Infusion Verify at 07/22/20 1236  Physical exam:  Vitals:   07/22/20 0923  BP: 139/85  Pulse: 97  Resp: 16  Temp:  98.3 F (36.8 C)  SpO2: 97%  Weight: 234 lb 8 oz (106.4 kg)   Physical Exam Constitutional:      General: She is not in acute distress. HENT:     Head: Normocephalic and atraumatic.  Eyes:     General: No scleral icterus. Cardiovascular:     Rate and Rhythm: Normal rate and regular rhythm.     Heart sounds: Normal heart sounds.  Pulmonary:     Effort: Pulmonary effort is normal. No respiratory distress.     Breath sounds: No wheezing.     Comments: Diminished breathing sounds.  left lower 1/3  Abdominal:     General: Bowel sounds are normal. There is no distension.     Palpations: Abdomen is soft.  Musculoskeletal:        General: No deformity. Normal range of motion.     Cervical back: Normal range of motion and neck supple.  Skin:    General: Skin is warm and dry.     Findings: No erythema  or rash.  Neurological:     Mental Status: She is alert and oriented to person, place, and time. Mental status is at baseline.     Cranial Nerves: No cranial nerve deficit.     Coordination: Coordination normal.  Psychiatric:        Mood and Affect: Mood normal.   L       CMP Latest Ref Rng & Units 07/22/2020  Glucose 70 - 99 mg/dL 144(H)  BUN 8 - 23 mg/dL 10  Creatinine 0.44 - 1.00 mg/dL 0.85  Sodium 135 - 145 mmol/L 137  Potassium 3.5 - 5.1 mmol/L 3.4(L)  Chloride 98 - 111 mmol/L 102  CO2 22 - 32 mmol/L 23  Calcium 8.9 - 10.3 mg/dL 9.0  Total Protein 6.5 - 8.1 g/dL 6.9  Total Bilirubin 0.3 - 1.2 mg/dL 0.7  Alkaline Phos 38 - 126 U/L 37(L)  AST 15 - 41 U/L 19  ALT 0 - 44 U/L 13   CBC Latest Ref Rng & Units 07/22/2020  WBC 4.0 - 10.5 K/uL 5.2  Hemoglobin 12.0 - 15.0 g/dL 10.4(L)  Hematocrit 36.0 - 46.0 % 31.8(L)  Platelets 150 - 400 K/uL 137(L)    RADIOGRAPHIC STUDIES: I have personally reviewed the radiological images as listed and agreed with the findings in the report. CT Chest W Contrast  Result Date: 05/21/2020 CLINICAL DATA:  64 year old female with history of ovarian cancer and pleural effusion. Preop evaluation for thoracoscopy. EXAM: CT CHEST WITH CONTRAST TECHNIQUE: Multidetector CT imaging of the chest was performed during intravenous contrast administration. CONTRAST:  81m OMNIPAQUE IOHEXOL 300 MG/ML  SOLN COMPARISON:  CT dated 04/24/2020. FINDINGS: Cardiovascular: There is no cardiomegaly or pericardial effusion. Mild atherosclerotic calcification of the aortic arch. The thoracic aorta is unremarkable. The origins of the great vessels of the aortic arch patent. Evaluation of the pulmonary arteries is limited due to suboptimal opacification and timing of the contrast. No large central pulmonary artery embolus identified. Mediastinum/Nodes: No hilar or mediastinal adenopathy. The esophagus and the thyroid gland are grossly unremarkable. No mediastinal fluid  collection. Lungs/Pleura: Small left pleural effusion similar to prior CT. Faint higher attenuation nodular thickening of the posterior left lower lobe pleural surface may be present. There is partial consolidative changes of the left lower lobe similar to prior CT which may represent atelectasis. There is no pneumothorax. The central airways are patent. Upper Abdomen: Stable 16 mm left adrenal nodule. The right adrenal gland is  unremarkable. Musculoskeletal: Degenerative changes. No acute osseous pathology. No suspicious bone lesions. IMPRESSION: 1. Small left pleural effusion similar to prior CT. Overall no significant interval change compared to the prior CT. 2. Stable left adrenal nodule. 3. Aortic Atherosclerosis (ICD10-I70.0). Electronically Signed   By: Anner Crete M.D.   On: 05/21/2020 21:04   CT CHEST ABDOMEN PELVIS W CONTRAST  Result Date: 04/25/2020 CLINICAL DATA:  64 year old female with history of metastatic carcinoma with malignant pleural effusion. Follow-up study. EXAM: CT CHEST, ABDOMEN, AND PELVIS WITH CONTRAST TECHNIQUE: Multidetector CT imaging of the chest, abdomen and pelvis was performed following the standard protocol during bolus administration of intravenous contrast. CONTRAST:  163m OMNIPAQUE IOHEXOL 300 MG/ML  SOLN COMPARISON:  Chest CT 02/17/2020. No prior CT the abdomen or pelvis. FINDINGS: CT CHEST FINDINGS Cardiovascular: Heart size is normal. There is no significant pericardial fluid, thickening or pericardial calcification. Aortic atherosclerosis. No definite coronary artery calcifications. Mediastinum/Nodes: No pathologically enlarged mediastinal or hilar lymph nodes. Esophagus is unremarkable in appearance. No axillary lymphadenopathy. Lungs/Pleura: Moderate left pleural effusion decreased in size compared to the prior study. Some pleural thickening and enhancement, compatible with reported malignant pleural effusion. This is associated with areas of passive atelectasis  in the left lower lobe and inferior segment of the lingula. No definite suspicious pulmonary nodules or masses are noted. No acute consolidative airspace disease. Musculoskeletal: There are no aggressive appearing lytic or blastic lesions noted in the visualized portions of the skeleton. CT ABDOMEN PELVIS FINDINGS Hepatobiliary: No suspicious cystic or solid hepatic lesions. No intra or extrahepatic biliary ductal dilatation. Gallbladder is normal in appearance. Pancreas: No pancreatic mass. No pancreatic ductal dilatation. No pancreatic or peripancreatic fluid collections or inflammatory changes. Spleen: Unremarkable. Adrenals/Urinary Tract: Bilateral kidneys and the right adrenal gland are normal in appearance. In the lateral limb of the left adrenal gland there is a 1.6 x 1.6 cm enhancing nodule, which appears stable in size compared to prior CT examinations dating back to at least 2017, presumably a benign lesion such as an adenoma. Stomach/Bowel: Normal appearance of the stomach. No pathologic dilatation of small bowel or colon. Normal appendix. Vascular/Lymphatic: Aortic atherosclerosis, without evidence of aneurysm or dissection in the abdominal or pelvic vasculature. No lymphadenopathy noted in the abdomen or pelvis. Reproductive: Uterus and ovaries are unremarkable in appearance. Other: Fat stranding and soft tissue attenuation in the region of the omentum, best appreciated anteriorly on axial images 86-90 of series 2, concerning for omental caking. No significant volume of ascites. No pneumoperitoneum. Musculoskeletal: There are no aggressive appearing lytic or blastic lesions noted in the visualized portions of the skeleton. IMPRESSION: 1. Moderate left malignant pleural effusion, decreased in size compared to the prior examination. 2. Omental caking highly concerning for intraperitoneal metastatic disease. 3. No definite primary malignancy confidently identified on today's examination. 4. Stable nodule  in the lateral limb of the left adrenal gland dating back to at least 2017, presumably a benign lesions such as an adrenal adenoma. 5. Aortic atherosclerosis. 6. Additional incidental findings, as above. Electronically Signed   By: DVinnie LangtonM.D.   On: 04/25/2020 10:41   [place]FT TISSUE UPPER EXTREMITY LIMITED LEFT (NON-VASCULAR)  Result Date: 04/30/2020 CLINICAL DATA:  IV infiltration 2 weeks ago. Evaluate for cutaneous abscess. EXAM: ULTRASOUND LEFT UPPER EXTREMITY LIMITED TECHNIQUE: Ultrasound examination of the upper extremity soft tissues was performed in the area of clinical concern. COMPARISON:  None. FINDINGS: Examination is targeted to the proximal posterior forearm. There is mild superficial  edema in this area, but no focal fluid collection. There is a small tubular structure in this area which is likely a superficial vein. This demonstrates no internal blood flow or compression, suspicious for superficial thrombophlebitis. IMPRESSION: 1. No evidence of abscess. Mild soft tissue edema in the area of concern. 2. Possible superficial thrombophlebitis. Electronically Signed   By: Richardean Sale M.D.   On: 04/30/2020 11:37     Assessment and plan Patient is a 64 y.o. female presents for follow-up for FIGO IIIC ovarian cancer, ( somatic BRCA1 positive, HRD positive). 1. Encounter for antineoplastic chemotherapy   2. Malignant pleural effusion   3. Carcinoma of fallopian tube, unspecified laterality ([place])   4. Neuropathy   5. Normocytic anemia    #FIGO IIIC Fallopian tube serous carcinoma Status post 4 cycles of carboplatin AUC 5, Taxol 175 mg/m. S/p total hysterectomy with salpingo-oophorectomy and omentectomy. Optimal (R<1) interval tumor debulking. Labs reviewed and discussed with patient.  She tolerates adjuvant carboplatin and Taxol 3 weeks ago.  Proceed with cycle 6 carboplatin AUC 5, Taxol 175 mg/m. Plan total 3 cycles of adjuvant chemotherapy.  # HRD:  No germline BRCA  mutation, -Homologous recombination deficiency positive.  Myriad Genomic instability score:  positive, somatic BRCA1 positive.  Patient will need future PARP inhibitor maintenance.  # pre-existing neuropathy, grade 2, worse at night.  Symptoms are partially relieved by gabapentin.  Currently she takes gabapentin 300 mg 3 times a day.  I discussed about option of take 300 mg during the day and 600 mg at night, if not working, she may do 300 twice during the day and 600 mg at night. Her neuropathy most likely will get worse with a cumulative dose of Taxol.  Consider dose reduction if symptoms are progressively worsening despite use of gabapentin.  #Anemia, likely secondary to recent surgery.  Stable hemoglobin.  10.4. #Mild thrombocytopenia, stable. We spent sufficient time to discuss many aspect of care, questions were answered to patient's satisfaction.  Follow-up in 3 weeks   Earlie Server, MD, PhD Hematology Oncology [facility] Pager- [phone] 07/22/2020

## 2020-07-22 NOTE — Progress Notes (Signed)
Patient had constipation a couple weeks ago and takes stool softener prn.  Neuropathy is not improving.

## 2020-07-23 ENCOUNTER — Inpatient Hospital Stay (HOSPITAL_BASED_OUTPATIENT_CLINIC_OR_DEPARTMENT_OTHER): Payer: Medicare Other | Admitting: Obstetrics and Gynecology

## 2020-07-23 ENCOUNTER — Telehealth: Payer: Self-pay | Admitting: Family

## 2020-07-23 VITALS — BP 131/79 | HR 88 | Temp 98.0°F | Resp 20 | Wt 231.0 lb

## 2020-07-23 DIAGNOSIS — C786 Secondary malignant neoplasm of retroperitoneum and peritoneum: Secondary | ICD-10-CM

## 2020-07-23 DIAGNOSIS — Z90722 Acquired absence of ovaries, bilateral: Secondary | ICD-10-CM

## 2020-07-23 DIAGNOSIS — C5700 Malignant neoplasm of unspecified fallopian tube: Secondary | ICD-10-CM

## 2020-07-23 DIAGNOSIS — C5702 Malignant neoplasm of left fallopian tube: Secondary | ICD-10-CM

## 2020-07-23 DIAGNOSIS — Z90710 Acquired absence of both cervix and uterus: Secondary | ICD-10-CM

## 2020-07-23 LAB — CA 125: Cancer Antigen (CA) 125: 4.8 U/mL (ref 0.0–38.1)

## 2020-07-23 NOTE — Telephone Encounter (Signed)
Patient called in stated that her cancer center doctor wanted to know if she still needs to be on B12

## 2020-07-23 NOTE — Progress Notes (Signed)
Gynecologic Oncology Interval Visit   Referring Provider: Dr. 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 G1P1 female, initially seen in consultation from Dr. 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   11/22/2021Cycle #5 paclitaxel and carboplatin CA125 4.6 07/22/2020 Cycle #6 paclitaxel and carboplatin  CA125 4.8  She is doing well but has had some constipation that has now approved with stool softeners. She has one cycle left of chemotherapy and is experiencing some IV access issues.   Vitamin B12 = 681  Gynecologic Oncology History:  Linda Schmitt is a 64 y.o. female, initially seen in consultation from Dr. 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 Her history is as follows:  She presented with malignant pleural effusion two weeks after elective right shoulder surgery. She underwent thoracentesis on 7/12 and 7/13 with 1.8L and 1.2L removed respectively. Cytology was positive for PAX 8. PET scan showed evidence of peritoneal disease within the abdomen, soft tissue infiltrating into the omentum, large loculated left pleural effusion with thin FDG avid rind of soft tissue overlying the left lung. Small right pleural effusion with mild FDG uptake is equivocal for malignant effusion of the right hemithorax. Fluid density structure within right side of pelvis abutting the right sided uterus noted though indeterminate. Pelvic u/s obtained showed heterogeneous myometrium with poor definition of endometrial complex. Normal left ovary.  No cystic right adnexal lesion is identified.  No a probable small normal-appearing right ovary is identified.  Suspect that the low-attenuation presumed cystic structure  seen in the right adnexa on the prior image corresponds to a small amount of nonspecific free fluid in the right adnexa.  Biopsy of omental mass on 03/05/20  DIAGNOSIS:  A. OMENTAL MASS; CT-GUIDED BIOPSY:  - HIGH-GRADE SEROUS CARCINOMA.   Given that cytology from left pleural effusion is positive for metastatic carcinoma, tumor cells are positive for PAX 8 which indicates GYN origin.  Given that patient has moderate to large amount of pleural effusion, heavy tumor burden, recommend to start chemotherapy as soon as possible with carboplatin AUC 5-6 and paclitaxel 175 mg/m every 3 weeks for 3 cycles.  Patient will establish care with gynecology oncology for evaluation of participating in clinical trials after neoadjuvant chemo/surgery. CA 125 was 53.6 at time of diagnosis.   Treatment Summary: 03/04/20 Cycle 1 carboplatin-paclitaxel CA 125 53.6 03/25/20 Cycle 2 carboplatin-paclitaxel CA 125 39.9 04/15/20  Cycle 3 carboplatin-paclitaxel CA 125 10.5  04/25/20 CT C/A/P revealed moderate left malignant pleural effusion with some pleural thickening and enhancement which was decreased in size compared to prior exam. Omental caking apparent concerning for intraperitoneal metastatic disease. No definite primary malignancy identified. Stable left adrenal nodule. Aortic atherosclerosis.   05/06/2020 Cycle 4 carboplatin-paclitaxel CA125 5.8  On 06/05/2020 she underwent Exam under anesthesia, Diagnostic laparoscopy, Robot-assisted total laparoscopic hysterectomy, Bilateral salpingo-oophorectomy, Omentectomy via mini-laparotomy/hand assisted port, and Optimal (R<1) interval tumor debulking. Pathology c/w tubal origin.   Pathology: High grade serous adenocarcinoma involving bilateral ovaries and fallopian tubes; peritoneal nodules, cul-de-sac and omentum (primary site left tube). p53, p16 (strong, patchy), Ki67 (elevated)     GENETIC TEST RESULTS:   Germline Testing: 04/07/20- negative. No clinically significant  mutation identified. Breast Cancer Riskscore remaining life time risk- 7%.  Somatic Testing: Genetic testing reported out on 04/08/2020 through the [facility] [employer] HRD. No pathogenic variants identified on the [facility] (germline) panel. Pathogenic variant identified through [employer] (HRD testing) in BRCA1 called [id number], HRD positive.   The [facility] gene panel offered by [employer] includes sequencing and deletion/duplication testing of the following 35 genes: APC, ATM, AXIN2, BARD1, BMPR1A, BRCA1, BRCA2, BRIP1, CHD1, CDK4, CDKN2A, CHEK2, EPCAM (large rearrangement only), HOXB13, GALNT12, MLH1, MSH2, MSH3, MSH6, MUTYH, NBN, NTHL1, PALB2, PMS2, PTEN, RAD51C, RAD51D, RNF43, RPS20, SMAD4, STK11, and TP53. Sequencing was performed for select regions of POLE and POLD1, and large rearrangement analysis was performed for select regions of GREM1.      Problem List: Patient Active Problem List   Diagnosis Date Noted  . Normocytic anemia 06/30/2020  . Carcinoma of fallopian tube ([place]) 06/25/2020  . BRCA1 gene mutation positive 04/11/2020  . Genetic testing 04/09/2020  . Serous carcinoma of female pelvis ([place]) 03/20/2020  . Family history of breast cancer   . Encounter for antineoplastic chemotherapy 03/04/2020  . Neuropathy 03/04/2020  . Mass of omentum 02/28/2020  . Goals of care, counseling/discussion 02/26/2020  . Metastatic carcinoma ([place]) 02/26/2020  . Status post thoracentesis   . Malignant pleural effusion   . Pleural effusion 02/17/2020  . Mass of arm, right 01/22/2020  . Elevated blood pressure reading 12/28/2019  . Hand tingling 12/28/2019  . Acute pain of right shoulder 12/01/2018  . Adrenal adenoma, left 11/11/2017  . Atherosclerosis of aorta ([place]) 09/23/2017  . Prediabetes 07/09/2017  . Tendonitis of wrist, right 07/06/2017  . Hyperlipidemia 10/11/2016  . Former smoker 09/21/2016  . Depression, recurrent ([place]) 09/14/2016  . Headache 09/14/2016   . Routine general medical examination at a health care facility 01/14/2015  . Screening for breast cancer 11/12/2013    Past Medical History: Past Medical History:  Diagnosis Date  . Family history of breast cancer   . Genetic testing 04/09/2020   No pathogenic variants identified on the [employer] [facility] (germline) panel. The report date is 04/07/2020.   The [facility] gene panel offered by [employer] includes sequencing and deletion/duplication testing of the following 35 genes: APC, ATM, AXIN2, BARD1, BMPR1A, BRCA1, BRCA2, BRIP1, CHD1, CDK4, CDKN2A, CHEK2, EPCAM (large rearrangement only), HOXB13, GALNT12, MLH1, MSH2, MSH3, MSH6  . HLD (hyperlipidemia)     Past Surgical History: Past Surgical History:  Procedure Laterality Date  . CARPAL TUNNEL RELEASE Left   . ROBOTIC ASSISTED TOTAL HYSTERECTOMY WITH BILATERAL SALPINGO OOPHERECTOMY  06/05/2020   with omentectomy tumor debulking via mini-laparotomy/hand assisted port  . SHOULDER ARTHROSCOPY WITH SUBACROMIAL DECOMPRESSION AND OPEN ROTATOR C Right 02/01/2020   Procedure: RIGHT SHOULDER ARTHROSCOPIC SUBSCAPULARIS REPAIR, ROTATOR CUFF REPAIR, SUABCROMIAL DECOMPRESSION, AND BICEPS TENODESIS.;  Surgeon: Leim Fabry, MD;  Location: [facility] ORS;  Service: Orthopedics;  Laterality: Right;  . TUBAL LIGATION      Past Gynecologic History: as per HPI   OB History:  OB History  Gravida Para Term Preterm AB Living  1 1       1   SAB IAB Ectopic Multiple Live Births               # Outcome Date GA Lbr Len/2nd Weight Sex Delivery Anes PTL Lv  1 Para             Family History: Family History  Problem Relation Age of Onset  . Cancer Mother        lymphoma  . Stroke Sister   .  Cancer Brother        lung  . Cancer Sister        lung  . Lung cancer Brother   . Bone cancer Sister   . Breast cancer Cousin 49       maternal  . Cancer Maternal Aunt        unk types  Family history significant for lung cancer, lymphoma, and  breast cancer.   Social History: Social History   Socioeconomic History  . Marital status: Single    Spouse name: Not on file  . Number of children: Not on file  . Years of education: Not on file  . Highest education level: Not on file  Occupational History  . Not on file  Tobacco Use  . Smoking status: Former Smoker    Packs/day: 0.75    Years: 40.00    Pack years: 30.00    Types: Cigarettes    Quit date: 02/06/2018    Years since quitting: 2.4  . Smokeless tobacco: Never Used  . Tobacco comment: quit 02/2018  Vaping Use  . Vaping Use: Never used  Substance and Sexual Activity  . Alcohol use: Yes    Comment: 2-3 glasses of wine per week  . Drug use: No  . Sexual activity: Not on file  Other Topics Concern  . Not on file  Social History Narrative   Lives in [place] alone. Work - [employer]   Diet: Regular   Exercise: walking   Social Determinants of Radio broadcast assistant Strain: Not on file  Food Insecurity: No Landscape architect  . Worried About Charity fundraiser in the Last Year: Never true  . Ran Out of Food in the Last Year: Never true  Transportation Needs: No Transportation Needs  . Lack of Transportation (Medical): No  . Lack of Transportation (Non-Medical): No  Physical Activity: Not on file  Stress: No Stress Concern Present  . Feeling of Stress : Not at all  Social Connections: Not on file  Intimate Partner Violence: Not At Risk  . Fear of Current or Ex-Partner: No  . Emotionally Abused: No  . Physically Abused: No  . Sexually Abused: No    Allergies: No Known Allergies  Current Medications: Current Outpatient Medications  Medication Sig Dispense Refill  . acetaminophen (TYLENOL) 500 MG tablet Take 2 tablets (1,000 mg total) by mouth every 8 (eight) hours. 90 tablet 2  . cholecalciferol (VITAMIN D3) 10 MCG (400 UNIT) TABS tablet Take 800 Units by mouth daily.    . cyanocobalamin 1000 MCG tablet Take 400 mcg by mouth daily.    Marland Kitchen gabapentin  (NEURONTIN) 300 MG capsule Take 1 capsule (300 mg total) by mouth 2 (two) times daily. 60 capsule 0  . potassium chloride SA (KLOR-CON) 20 MEQ tablet Take 1 tablet (20 mEq total) by mouth daily. 2 tablet 0  . pravastatin (PRAVACHOL) 40 MG tablet TAKE 1 TABLET(40 MG) BY MOUTH DAILY 90 tablet 0   No current facility-administered medications for this visit.   Immunization History  Administered Date(s) Administered  . PFIZER SARS-COV-2 Vaccination 05/08/2020, 05/29/2020  . Tdap 12/24/2011     Review of Systems General:  no complaints Skin: no complaints Eyes: no complaints HEENT: no complaints Breasts: no complaints Pulmonary: no complaints Cardiac: no complaints Gastrointestinal: diarrhea o/w no complaints Genitourinary/Sexual: no complaints Ob/Gyn: no complaints Musculoskeletal: no complaints Hematology: no complaints Neurologic/Psych: no complaints   Objective:  Physical Examination:  BP 131/79   Pulse  88   Temp 98 F (36.7 C)   Resp 20   Wt 231 lb (104.8 kg)   SpO2 100%   BMI 40.92 kg/m     ECOG Performance Status: 1 - Symptomatic but completely ambulatory  GENERAL: Patient is a well appearing female in no acute distress HEENT:  PERRL ABDOMEN:  Soft, nontender and nondistended. No hernias/ascites/masses   EXTREMITIES:  No peripheral edema.   SKIN:  Clear with no obvious rashes or skin changes. No nail dyscrasia. NEURO:  Nonfocal. Well oriented.  Appropriate affect.  Pelvic: chaperoned by CMA EGBUS: no lesions Cervix: absent Vagina: no lesions, no discharge or bleeding. Cuff intact. No sutures present. No nodularity on exam. Uterus: absent BME: no palpable masses Rectovaginal: deferred   Lab Review n/a  Radiologic Imaging: As per prior notes        Assessment:  Linda Schmitt is a 64 y.o. female diagnosed with metastatic high grade serous Fallopian tube cancer (BRCA1 somatic mutation; HRD) s/p NACT with paclitaxel/carboplatin s/p 4 cycles and MIS  optimal interval debulking on 06/05/2020. Excellent postoperative recovery and resumption of chemotherapy.   Pleural effusion, asymptomatic  Constipation, improved  Medical co-morbidities complicating care: Body mass index is 40.92 kg/m. Plan:   Problem List Items Addressed This Visit      Genitourinary   Carcinoma of fallopian tube ([place]) - Primary     Recommend 1 additional cycles of chemotherapy and then switch to maintenance PARPi.   B12 level was normal; recommend discuss need for continued B12 therapy with her PCP who started her on this medication.   Continue stool softeners as needed for constipation.     Follow up with Dr. Tasia Catchings for next cycle of chemotherapy and initiation of PARPi. Follow up in [place] clinic in 3 months.   A total of at least 15 minutes were spent with the patient/family today; >50% was spent in education, counseling and coordination of care for Fallopian tube cancer.   Semaj Kham Gaetana Michaelis, MD

## 2020-07-23 NOTE — Telephone Encounter (Signed)
Please advise. Was normal 3 weeks ago.

## 2020-07-25 NOTE — Telephone Encounter (Signed)
Call pt Yes she may stop if that is her preference.  b12 can help with energy or numbness (neuropathy) , if she felt helping with either of these, she may continue.   Is she doing injections or oral B12?   Neg intrinsic factor 01/2020

## 2020-07-25 NOTE — Telephone Encounter (Signed)
Pt called & advised that she may continue if she wants if helping with energy as well as neuropathy. Otherwise she may stop.

## 2020-07-28 ENCOUNTER — Other Ambulatory Visit: Payer: Self-pay

## 2020-07-28 NOTE — Telephone Encounter (Signed)
Discussed at 07/22/20 MD visit:  # pre-existing neuropathy, grade 2, worse at night.  Symptoms are partially relieved by gabapentin.  Currently she takes gabapentin 300 mg 3 times a day.  I discussed about option of take 300 mg during the day and 600 mg at night, if not working, she may do 300 twice during the day and 600 mg at night. Her neuropathy most likely will get worse with a cumulative dose of Taxol.  Consider dose reduction if symptoms are progressively worsening despite use of gabapentin.  I confirmed with patient that she is taking 1 tab during the day with  2 QHS.

## 2020-07-31 MED ORDER — GABAPENTIN 300 MG PO CAPS: 300.0000 mg | ORAL_CAPSULE | Freq: Two times a day (BID) | ORAL | 2 refills | Status: DC

## 2020-08-12 ENCOUNTER — Inpatient Hospital Stay: Payer: Medicare HMO

## 2020-08-12 ENCOUNTER — Telehealth: Payer: Self-pay | Admitting: Pharmacy Technician

## 2020-08-12 ENCOUNTER — Telehealth: Payer: Self-pay | Admitting: Pharmacist

## 2020-08-12 ENCOUNTER — Other Ambulatory Visit: Payer: Self-pay

## 2020-08-12 ENCOUNTER — Encounter: Payer: Self-pay | Admitting: Oncology

## 2020-08-12 ENCOUNTER — Inpatient Hospital Stay: Payer: Medicare HMO | Attending: Oncology

## 2020-08-12 ENCOUNTER — Inpatient Hospital Stay (HOSPITAL_BASED_OUTPATIENT_CLINIC_OR_DEPARTMENT_OTHER): Payer: Medicare HMO | Admitting: Oncology

## 2020-08-12 VITALS — BP 121/83 | HR 102 | Temp 97.6°F | Resp 18 | Wt 234.3 lb

## 2020-08-12 DIAGNOSIS — Z148 Genetic carrier of other disease: Secondary | ICD-10-CM | POA: Diagnosis not present

## 2020-08-12 DIAGNOSIS — C563 Malignant neoplasm of bilateral ovaries: Secondary | ICD-10-CM | POA: Diagnosis not present

## 2020-08-12 DIAGNOSIS — Z5111 Encounter for antineoplastic chemotherapy: Secondary | ICD-10-CM

## 2020-08-12 DIAGNOSIS — Z1509 Genetic susceptibility to other malignant neoplasm: Secondary | ICD-10-CM | POA: Insufficient documentation

## 2020-08-12 DIAGNOSIS — Z90710 Acquired absence of both cervix and uterus: Secondary | ICD-10-CM | POA: Diagnosis not present

## 2020-08-12 DIAGNOSIS — L259 Unspecified contact dermatitis, unspecified cause: Secondary | ICD-10-CM | POA: Insufficient documentation

## 2020-08-12 DIAGNOSIS — D696 Thrombocytopenia, unspecified: Secondary | ICD-10-CM | POA: Insufficient documentation

## 2020-08-12 DIAGNOSIS — C5700 Malignant neoplasm of unspecified fallopian tube: Secondary | ICD-10-CM | POA: Diagnosis not present

## 2020-08-12 DIAGNOSIS — Z90722 Acquired absence of ovaries, bilateral: Secondary | ICD-10-CM | POA: Diagnosis not present

## 2020-08-12 DIAGNOSIS — J910 Malignant pleural effusion: Secondary | ICD-10-CM | POA: Insufficient documentation

## 2020-08-12 DIAGNOSIS — D649 Anemia, unspecified: Secondary | ICD-10-CM

## 2020-08-12 DIAGNOSIS — Z1501 Genetic susceptibility to malignant neoplasm of breast: Secondary | ICD-10-CM | POA: Insufficient documentation

## 2020-08-12 DIAGNOSIS — C763 Malignant neoplasm of pelvis: Secondary | ICD-10-CM

## 2020-08-12 DIAGNOSIS — C786 Secondary malignant neoplasm of retroperitoneum and peritoneum: Secondary | ICD-10-CM | POA: Insufficient documentation

## 2020-08-12 DIAGNOSIS — G629 Polyneuropathy, unspecified: Secondary | ICD-10-CM | POA: Insufficient documentation

## 2020-08-12 DIAGNOSIS — Z87891 Personal history of nicotine dependence: Secondary | ICD-10-CM | POA: Insufficient documentation

## 2020-08-12 LAB — COMPREHENSIVE METABOLIC PANEL
ALT: 14 U/L (ref 0–44)
AST: 17 U/L (ref 15–41)
Albumin: 4.1 g/dL (ref 3.5–5.0)
Alkaline Phosphatase: 46 U/L (ref 38–126)
Anion gap: 11 (ref 5–15)
BUN: 10 mg/dL (ref 8–23)
CO2: 25 mmol/L (ref 22–32)
Calcium: 8.9 mg/dL (ref 8.9–10.3)
Chloride: 102 mmol/L (ref 98–111)
Creatinine, Ser: 0.89 mg/dL (ref 0.44–1.00)
GFR, Estimated: >60 mL/min (ref >60)
Glucose, Bld: 129 mg/dL — ABNORMAL HIGH (ref 70–99)
Potassium: 3.4 mmol/L — ABNORMAL LOW (ref 3.5–5.1)
Sodium: 138 mmol/L (ref 135–145)
Total Bilirubin: 0.5 mg/dL (ref 0.3–1.2)
Total Protein: 6.9 g/dL (ref 6.5–8.1)

## 2020-08-12 LAB — CBC WITH DIFFERENTIAL/PLATELET
Abs Immature Granulocytes: 0.03 10*3/uL (ref 0.00–0.07)
Basophils Absolute: 0 10*3/uL (ref 0.0–0.1)
Basophils Relative: 0 %
Eosinophils Absolute: 0 10*3/uL (ref 0.0–0.5)
Eosinophils Relative: 0 %
HCT: 32 % — ABNORMAL LOW (ref 36.0–46.0)
Hemoglobin: 10.3 g/dL — ABNORMAL LOW (ref 12.0–15.0)
Immature Granulocytes: 1 %
Lymphocytes Relative: 34 %
Lymphs Abs: 1.6 10*3/uL (ref 0.7–4.0)
MCH: 29.7 pg (ref 26.0–34.0)
MCHC: 32.2 g/dL (ref 30.0–36.0)
MCV: 92.2 fL (ref 80.0–100.0)
Monocytes Absolute: 0.5 10*3/uL (ref 0.1–1.0)
Monocytes Relative: 10 %
Neutro Abs: 2.6 10*3/uL (ref 1.7–7.7)
Neutrophils Relative %: 55 %
Platelets: 138 10*3/uL — ABNORMAL LOW (ref 150–400)
RBC: 3.47 MIL/uL — ABNORMAL LOW (ref 3.87–5.11)
RDW: 14.7 % (ref 11.5–15.5)
WBC: 4.8 10*3/uL (ref 4.0–10.5)
nRBC: 0 % (ref 0.0–0.2)

## 2020-08-12 MED ORDER — SODIUM CHLORIDE 0.9 % IV SOLN
175.0000 mg/m2 | Freq: Once | INTRAVENOUS | Status: AC
  Administered 2020-08-12: 390 mg via INTRAVENOUS
  Filled 2020-08-12: qty 65

## 2020-08-12 MED ORDER — SODIUM CHLORIDE 0.9 % IV SOLN
10.0000 mg | Freq: Once | INTRAVENOUS | Status: AC
  Administered 2020-08-12: 10 mg via INTRAVENOUS
  Filled 2020-08-12: qty 10

## 2020-08-12 MED ORDER — PALONOSETRON HCL INJECTION 0.25 MG/5ML
0.2500 mg | Freq: Once | INTRAVENOUS | Status: AC
  Administered 2020-08-12: 0.25 mg via INTRAVENOUS
  Filled 2020-08-12: qty 5

## 2020-08-12 MED ORDER — FAMOTIDINE IN NACL 20-0.9 MG/50ML-% IV SOLN
20.0000 mg | Freq: Once | INTRAVENOUS | Status: AC
  Administered 2020-08-12: 20 mg via INTRAVENOUS
  Filled 2020-08-12: qty 50

## 2020-08-12 MED ORDER — SODIUM CHLORIDE 0.9 % IV SOLN
Freq: Once | INTRAVENOUS | Status: AC
  Filled 2020-08-12: qty 250

## 2020-08-12 MED ORDER — OLAPARIB 150 MG PO TABS: 300.0000 mg | ORAL_TABLET | Freq: Two times a day (BID) | ORAL | 1 refills | Status: DC

## 2020-08-12 MED ORDER — CARBOPLATIN CHEMO INJECTION 600 MG/60ML
588.5000 mg | Freq: Once | INTRAVENOUS | Status: AC
Start: 2020-08-12 — End: 2020-08-12
  Administered 2020-08-12: 590 mg via INTRAVENOUS
  Filled 2020-08-12: qty 59

## 2020-08-12 MED ORDER — SODIUM CHLORIDE 0.9 % IV SOLN
150.0000 mg | Freq: Once | INTRAVENOUS | Status: AC
  Administered 2020-08-12: 150 mg via INTRAVENOUS
  Filled 2020-08-12: qty 150

## 2020-08-12 MED ORDER — DIPHENHYDRAMINE HCL 50 MG/ML IJ SOLN
50.0000 mg | Freq: Once | INTRAMUSCULAR | Status: AC
  Administered 2020-08-12: 50 mg via INTRAVENOUS
  Filled 2020-08-12: qty 1

## 2020-08-12 NOTE — Progress Notes (Signed)
Patient has a new itchy rash on back of neck.

## 2020-08-12 NOTE — Telephone Encounter (Signed)
Oral Oncology Patient Advocate Encounter  After completing a benefits investigation, prior authorization for Garey Ham is not required at this time through [facility].  Patient's copay is $3202.65.    Daine Floras CPHT [facility] Patient Advocate [facility] Phone [phone] Fax [phone] 08/12/2020 2:03 PM

## 2020-08-12 NOTE — Addendum Note (Signed)
Addended by: [name] on: 08/12/2020 12:54 PM   Modules accepted: Orders

## 2020-08-12 NOTE — Progress Notes (Signed)
Hematology/Oncology Follow Up Note [facility]  [phone] [phone]  Patient Care Team: Burnard Hawthorne, FNP as PCP - General (Family Medicine) Clent Jacks, RN as Oncology Nurse Navigator   Name of the patient: Linda Schmitt  [id number]  October 07, 1955   REASON FOR VISIT  follow-up for serous carcinoma  PERTINENT ONCOLOGY HISTORY Linda Schmitt is a 65 y.o.afemale who has above oncology history reviewed by me today presented for follow up visit for management of malignant pleural effusion status post thoracentesis x2 during her recent admission. Cytology was positive for metastatic carcinoma.  TTF-1 Napsin A, GATA3, CDX2 were negative. Positive for PAX 8, which is most often expressing tumors of gynecological and renal origin. PET scan was independently reviewed by me and discussed with patient.  I also discussed with radiology. Exam showed evidence of peritoneal disease within the abdomen, soft tissue infiltrating into the omentum.  Large loculated left pleural effusion with thin FDG avid rind of soft tissue overlying the left lung.  Small right pleural effusion with mild FDG uptake is equivocal for malignant effusion of the right hemithorax. Fluid density structure within the right side of the pelvis abutting the right-sided of uterus is noted.  This is indeterminate.  Further investigation with pelvic sonogram may be helpful.  Pelvic ultrasound was obtained.-Heterogeneous myometrium with poor definition of endometrial complex. Normal left ovary.  No cystic right adnexal lesion is identified.  No a probable small normal-appearing right ovary is identified.  Suspect that the low-attenuation presumed cystic structure seen in the right adnexa on the prior image corresponds to a small amount of nonspecific free fluid in the right adnexa. I discussed with gynecology oncology Dr.Secord.  Given that cytology from left pleural effusion is  positive for metastatic carcinoma, tumor cells are positive for PAX 8 which indicates GYN origin.  Given that patient has moderate to large amount of pleural effusion, heavy tumor burden, recommend to start chemotherapy as soon as possible with carboplatin AUC 5-6 and Taxol 175 mg/m every 3 weeks for 3 cycles.   #Pre-existing neuropathy of left hand, she takes gabapentin. # seen by Dr. Theora Gianotti on 04/30/2020 and she recommends to proceed with the fourth cycle of chemotherapy and patient will be seen by Dr. Theora Gianotti again on 05/27/2020 for debulking surgery plan on 06/05/2020 with cardiothoracic surgery for thoracoscopy possible thoracic tumor debulking.  Patient was recommended to have to repeat CT of the chest after her chemotherapy.  Oncologic chemotherapy treatments 03/04/2020-carbo and Taxol 03/25/2020-carbo and Taxol 04/15/2020-carbo and Taxol 05/06/2020-carbotaxol   06/05/2020, status post Total hysterectomy and bilateral salpingo-oophorectomy, Omentectomy, and Optimal (R<1) interval tumor debulking. A. Peritoneal nodule, excisional biopsy: Fibroadipose tissue with calcifications, negative for tumor. B. Uterus, bilateral ovaries and fallopian tubes, hysterectomy and bilateral salpingo-oophorectomy: Cervix: Squamous metaplasia. Endometrium: Inactive endometrium. Myometrium: Adenomyosis. Serosa: High grade serous adenocarcinoma. Right ovary: High grade serous adenocarcinoma, involving the ovarian surface and parenchyma. Right fallopian tube: No specific pathologic change. Left ovary: High grade serous adenocarcinoma, involving the ovarian surface and parenchyma. Left fallopian tube: High grade serous adenocarcinoma, involving tubal fimbria. Serous tubal intraepithelial carcinoma. Immunohistochemistry was performed on block B14 at [facility] to characterize the pathologic process and demonstrates the following immunophenotype in the cells of interest:POSITIVE:  p53, p16 (strong, patchy), Ki67  (elevated) This staining profile supports the above diagnosis. C. Peritoneal nodule, left pericolic gutter, excisional biopsy: High grade serous adenocarcinoma. D. Cul-de-sac, excisional biopsy: High grade serous adenocarcinoma. E. Omentum, excision: High grade  serous adenocarcinoma (up to 3.0 cm).  TUMOR  Tumor Site:  Left fallopian tube  Histologic Type:  Serous carcinoma  Histologic Grade:  High grade  Tumor Size:  Greatest Dimension (Centimeters): 0.2 cm  Ovarian Surface Involvement:  Present   Laterality:  Bilateral  Fallopian Tube Surface Involvement:  Present   Laterality:  Left  Implants:  Present (sites): left peritoneum, cul-de-sac, omentum  Other Tissue / Organ Involvement:  Right ovary  Other Tissue / Organ Involvement:  Left ovary  Other Tissue / Organ Involvement:  Left fallopian tube  Other Tissue / Organ Involvement:  Pelvic peritoneum  Other Tissue / Organ Involvement:  Omentum  Largest Extrapelvic Peritoneal Focus:  Macroscopic (greater than 2 cm)  Peritoneal / Ascitic Fluid:  Not submitted / unknown  Pleural Fluid:  Not submitted / unknown  Treatment Effect:  Moderate response identified (CRS 2)   LYMPH NODES  Regional Lymph Nodes:  No lymph nodes submitted or found   PATHOLOGIC STAGE CLASSIFICATION (pTNM, AJCC 8th Edition)  TNM Descriptors:  y (post-treatment)  Primary Tumor (pT):  pT3c  Regional Lymph Nodes (pN):  pNX FIGO Stage:  IIIC   + pleural effusion so STAGE IV No germline BRCA mutation, -Homologous recombination deficiency positive.  Myriad Genomic instability score:  positive, somatic BRCA1 positive  INTERVAL HISTORY 65 year old female presents for follow-up of Ovarian cancer,  somatic BRCA1 positive and HRD positive.  07/01/2020, adjuvant chemotherapy, and Taxol.  She tolerates well. Slightly worse neuropathy.  She takes gabapentin  Denies any nausea vomiting  diarrhea. She has developed a small area of rash on her neck.  Mildly itchy.  Review of Systems  Constitutional: Negative for appetite change, chills, fatigue and fever.  HENT:   Negative for hearing loss and voice change.   Eyes: Negative for eye problems.  Respiratory: Negative for chest tightness and cough.   Cardiovascular: Negative for chest pain.  Gastrointestinal: Negative for abdominal distention, abdominal pain and blood in stool.  Endocrine: Negative for hot flashes.  Genitourinary: Negative for difficulty urinating and frequency.   Musculoskeletal: Negative for arthralgias.       Left upper extremity swelling and tenderness have improved.  Skin: Positive for rash. Negative for itching.  Neurological: Positive for numbness. Negative for extremity weakness.       Pre-existing neuropathy of left hand.  Hematological: Negative for adenopathy.  Psychiatric/Behavioral: Negative for confusion.      No Known Allergies   Past Medical History:  Diagnosis Date  . Family history of breast cancer   . Genetic testing 04/09/2020   No pathogenic variants identified on the Myriad MyRisk (germline) panel. The report date is 04/07/2020.   The [facility] gene panel offered by [employer] includes sequencing and deletion/duplication testing of the following 35 genes: APC, ATM, AXIN2, BARD1, BMPR1A, BRCA1, BRCA2, BRIP1, CHD1, CDK4, CDKN2A, CHEK2, EPCAM (large rearrangement only), HOXB13, GALNT12, MLH1, MSH2, MSH3, MSH6  . HLD (hyperlipidemia)      Past Surgical History:  Procedure Laterality Date  . CARPAL TUNNEL RELEASE Left   . ROBOTIC ASSISTED TOTAL HYSTERECTOMY WITH BILATERAL SALPINGO OOPHERECTOMY  06/05/2020   with omentectomy tumor debulking via mini-laparotomy/hand assisted port  . SHOULDER ARTHROSCOPY WITH SUBACROMIAL DECOMPRESSION AND OPEN ROTATOR C Right 02/01/2020   Procedure: RIGHT SHOULDER ARTHROSCOPIC SUBSCAPULARIS REPAIR, ROTATOR CUFF REPAIR, SUABCROMIAL  DECOMPRESSION, AND BICEPS TENODESIS.;  Surgeon: Leim Fabry, MD;  Location: [facility] ORS;  Service: Orthopedics;  Laterality: Right;  . TUBAL LIGATION      Social  History   Socioeconomic History  . Marital status: Single    Spouse name: Not on file  . Number of children: Not on file  . Years of education: Not on file  . Highest education level: Not on file  Occupational History  . Not on file  Tobacco Use  . Smoking status: Former Smoker    Packs/day: 0.75    Years: 40.00    Pack years: 30.00    Types: Cigarettes    Quit date: 02/06/2018    Years since quitting: 2.5  . Smokeless tobacco: Never Used  . Tobacco comment: quit 02/2018  Vaping Use  . Vaping Use: Never used  Substance and Sexual Activity  . Alcohol use: Yes    Comment: 2-3 glasses of wine per week  . Drug use: No  . Sexual activity: Not on file  Other Topics Concern  . Not on file  Social History Narrative   Lives in [place] alone. Work - [employer]   Diet: Regular   Exercise: walking   Social Determinants of Radio broadcast assistant Strain: Not on file  Food Insecurity: No Landscape architect  . Worried About Charity fundraiser in the Last Year: Never true  . Ran Out of Food in the Last Year: Never true  Transportation Needs: No Transportation Needs  . Lack of Transportation (Medical): No  . Lack of Transportation (Non-Medical): No  Physical Activity: Not on file  Stress: No Stress Concern Present  . Feeling of Stress : Not at all  Social Connections: Not on file  Intimate Partner Violence: Not At Risk  . Fear of Current or Ex-Partner: No  . Emotionally Abused: No  . Physically Abused: No  . Sexually Abused: No    Family History  Problem Relation Age of Onset  . Cancer Mother        lymphoma  . Stroke Sister   . Cancer Brother        lung  . Cancer Sister        lung  . Lung cancer Brother   . Bone cancer Sister   . Breast cancer Cousin 69       maternal  . Cancer Maternal Aunt        unk  types     Current Outpatient Medications:  .  acetaminophen (TYLENOL) 500 MG tablet, Take 2 tablets (1,000 mg total) by mouth every 8 (eight) hours., Disp: 90 tablet, Rfl: 2 .  cholecalciferol (VITAMIN D3) 10 MCG (400 UNIT) TABS tablet, Take 800 Units by mouth daily., Disp: , Rfl:  .  cyanocobalamin 1000 MCG tablet, Take 400 mcg by mouth daily., Disp: , Rfl:  .  gabapentin (NEURONTIN) 300 MG capsule, Take 1 capsule (300 mg total) by mouth 2 (two) times daily., Disp: 60 capsule, Rfl: 2 .  potassium chloride SA (KLOR-CON) 20 MEQ tablet, Take 1 tablet (20 mEq total) by mouth daily., Disp: 2 tablet, Rfl: 0 .  pravastatin (PRAVACHOL) 40 MG tablet, TAKE 1 TABLET(40 MG) BY MOUTH DAILY, Disp: 90 tablet, Rfl: 0  Physical exam:  Vitals:   08/12/20 0901  BP: 121/83  Pulse: (!) 102  Resp: 18  Temp: 97.6 F (36.4 C)  Weight: 234 lb 4.8 oz (106.3 kg)   Physical Exam Constitutional:      General: She is not in acute distress. HENT:     Head: Normocephalic and atraumatic.  Eyes:     General: No scleral icterus. Cardiovascular:  Rate and Rhythm: Normal rate and regular rhythm.     Heart sounds: Normal heart sounds.  Pulmonary:     Effort: Pulmonary effort is normal. No respiratory distress.     Breath sounds: No wheezing.     Comments: Diminished breathing sounds.  left lower 1/3  Abdominal:     General: Bowel sounds are normal. There is no distension.     Palpations: Abdomen is soft.  Musculoskeletal:        General: No deformity. Normal range of motion.     Cervical back: Normal range of motion and neck supple.  Skin:    General: Skin is warm and dry.     Findings: No erythema or rash.  Neurological:     Mental Status: She is alert and oriented to person, place, and time. Mental status is at baseline.     Cranial Nerves: No cranial nerve deficit.     Coordination: Coordination normal.  Psychiatric:        Mood and Affect: Mood normal.   L       CMP Latest Ref Rng &  Units 08/12/2020  Glucose 70 - 99 mg/dL 129(H)  BUN 8 - 23 mg/dL 10  Creatinine 0.44 - 1.00 mg/dL 0.89  Sodium 135 - 145 mmol/L 138  Potassium 3.5 - 5.1 mmol/L 3.4(L)  Chloride 98 - 111 mmol/L 102  CO2 22 - 32 mmol/L 25  Calcium 8.9 - 10.3 mg/dL 8.9  Total Protein 6.5 - 8.1 g/dL 6.9  Total Bilirubin 0.3 - 1.2 mg/dL 0.5  Alkaline Phos 38 - 126 U/L 46  AST 15 - 41 U/L 17  ALT 0 - 44 U/L 14   CBC Latest Ref Rng & Units 08/12/2020  WBC 4.0 - 10.5 K/uL 4.8  Hemoglobin 12.0 - 15.0 g/dL 10.3(L)  Hematocrit 36.0 - 46.0 % 32.0(L)  Platelets 150 - 400 K/uL 138(L)    RADIOGRAPHIC STUDIES: I have personally reviewed the radiological images as listed and agreed with the findings in the report. CT Chest W Contrast  Result Date: 05/21/2020 CLINICAL DATA:  65 year old female with history of ovarian cancer and pleural effusion. Preop evaluation for thoracoscopy. EXAM: CT CHEST WITH CONTRAST TECHNIQUE: Multidetector CT imaging of the chest was performed during intravenous contrast administration. CONTRAST:  48m OMNIPAQUE IOHEXOL 300 MG/ML  SOLN COMPARISON:  CT dated 04/24/2020. FINDINGS: Cardiovascular: There is no cardiomegaly or pericardial effusion. Mild atherosclerotic calcification of the aortic arch. The thoracic aorta is unremarkable. The origins of the great vessels of the aortic arch patent. Evaluation of the pulmonary arteries is limited due to suboptimal opacification and timing of the contrast. No large central pulmonary artery embolus identified. Mediastinum/Nodes: No hilar or mediastinal adenopathy. The esophagus and the thyroid gland are grossly unremarkable. No mediastinal fluid collection. Lungs/Pleura: Small left pleural effusion similar to prior CT. Faint higher attenuation nodular thickening of the posterior left lower lobe pleural surface may be present. There is partial consolidative changes of the left lower lobe similar to prior CT which may represent atelectasis. There is no  pneumothorax. The central airways are patent. Upper Abdomen: Stable 16 mm left adrenal nodule. The right adrenal gland is unremarkable. Musculoskeletal: Degenerative changes. No acute osseous pathology. No suspicious bone lesions. IMPRESSION: 1. Small left pleural effusion similar to prior CT. Overall no significant interval change compared to the prior CT. 2. Stable left adrenal nodule. 3. Aortic Atherosclerosis (ICD10-I70.0). Electronically Signed   By: AAnner CreteM.D.   On: 05/21/2020 21:04  Assessment and plan Patient is a 65 y.o. female presents for follow-up for FIGO IIIC ovarian cancer, ( somatic BRCA1 positive, HRD positive). 1. Carcinoma of fallopian tube, unspecified laterality ([place])   2. Encounter for antineoplastic chemotherapy   3. Malignant pleural effusion   4. Normocytic anemia    #FIGO IVA Fallopian tube serous carcinoma- + pleural effusion cytology Status post 4 cycles of carboplatin AUC 5, Taxol 175 mg/m. S/p total hysterectomy with salpingo-oophorectomy and omentectomy. Optimal (R<1) interval tumor debulking. Labs are reviewed and discussed with patient. Proceed with cycle 7 carbopltain  AUC 5, Taxol 175 mg/m.Marland Kitchen  # HRD, plan  PARP inhibitor maintenance.  Check olaparib coverage.  # pre-existing neuropathy, grade 2, continue gabapentin.  Stable. #Anemia, likely secondary to recent surgery.  Stable hemoglobin.  10.3. #Mild thrombocytopenia, count is stable.  Monitor. #Skin rash, contact dermatitis versus drug-induced.  Recommend patient to utilize over-the-counter hydrocortisone cream.  She may also take Benadryl for itchiness. We spent sufficient time to discuss many aspect of care, questions were answered to patient's satisfaction.  Follow-up in 3 weeks   Earlie Server, MD, PhD Hematology Oncology [facility] Pager- [phone] 08/12/2020

## 2020-08-12 NOTE — Telephone Encounter (Signed)
Oral Oncology Pharmacy Student Encounter  Received new prescription for Lynparza (olaparib) for the maintenance treatment of IVA fallopian tube carcinoma, planned duration until disease progression or unacceptable toxicity or completion of 2 years of therapy.  CBC and CMP from 08/12/20 assessed, no relevant lab abnormalities noted. Prescription dose and frequency assessed.   Current medication list in Epic reviewed, no DDIs with [place] identified.  Evaluated chart and no patient barriers to medication adherence identified.   Oral Oncology Clinic will continue to follow for insurance authorization, copayment issues, initial counseling and start date.  Lonia Chimera, PharmD Candidate [facility] Oral Chemotherapy Navigation [facility] [phone]  08/12/2020 2:14 PM

## 2020-08-12 NOTE — Progress Notes (Signed)
Stable at discharge 

## 2020-08-13 LAB — CA 125: Cancer Antigen (CA) 125: 4.3 U/mL (ref 0.0–38.1)

## 2020-08-14 NOTE — Telephone Encounter (Signed)
Application for [employer] Lynparza assistance faxed on 08/14/20. Await determination

## 2020-08-20 ENCOUNTER — Other Ambulatory Visit: Payer: Self-pay | Admitting: *Deleted

## 2020-08-20 MED ORDER — GABAPENTIN 300 MG PO CAPS: 300.0000 mg | ORAL_CAPSULE | Freq: Two times a day (BID) | ORAL | 2 refills | Status: DC

## 2020-08-22 NOTE — Telephone Encounter (Signed)
Oral Oncology Patient Advocate Encounter  Received notification from [employer] Patient Assistance program that patient has been successfully enrolled into their program to receive Lynparza from the manufacturer at $0 out of pocket until 08/08/21.    I called and spoke with patient.  She knows we will have to re-apply.   Specialty Pharmacy that will dispense medication is [facility].  Patient knows to call the office with questions or concerns.   Oral Oncology Clinic will continue to follow.  [place] Patient [place] Phone [phone] Fax [phone] 08/22/2020 12:29 PM

## 2020-08-22 NOTE — Telephone Encounter (Signed)
Set up first shipment to be delivered in 3-5 business days.  Signature required for delivery.    [place] Patient [place] Phone [phone] Fax [phone] 08/22/2020 12:31 PM

## 2020-08-29 ENCOUNTER — Telehealth: Payer: Self-pay | Admitting: Pharmacist

## 2020-08-29 NOTE — Telephone Encounter (Signed)
Patient received medication on 08/28/20 per [employer] tracking.

## 2020-08-29 NOTE — Telephone Encounter (Signed)
Oral Chemotherapy Pharmacy Student Encounter  Patient Education I spoke with patient for overview of new oral chemotherapy medication: Lynparza (olaparib) for the maintenance treatment of IVA fallopian tube carcinoma, planned duration until disease progression or unacceptable toxicity or completion of 2 years of therapy.  Pt is doing well. Counseled patient on administration, dosing, side effects, monitoring, drug-food interactions, safe handling, storage, and disposal. Patient will take 2 tablets (300 mg total) by mouth 2 (two) times daily.  Side effects include but not limited to: nausea/vomiting, diarrhea, fatigue, decreased WBC/platelets.    Reviewed with patient importance of keeping a medication schedule and plan for any missed doses.  After discussion with patient no patient barriers to medication adherence identified.   Linda Schmitt voiced understanding and appreciation. All questions answered. Medication handout mailed to patient.  Provided patient with [facility] [place] [facility] phone number. Patient knows to call the office with questions or concerns. Oral Chemotherapy Navigation Clinic will continue to follow.  Brenton Grills, PharmD Candidate [facility] [phone]  08/29/2020 3:38 PM

## 2020-09-02 ENCOUNTER — Inpatient Hospital Stay: Payer: Medicare HMO

## 2020-09-02 ENCOUNTER — Inpatient Hospital Stay (HOSPITAL_BASED_OUTPATIENT_CLINIC_OR_DEPARTMENT_OTHER): Payer: Medicare HMO | Admitting: Oncology

## 2020-09-02 ENCOUNTER — Ambulatory Visit: Payer: Medicare Other | Admitting: Pharmacist

## 2020-09-02 ENCOUNTER — Encounter: Payer: Self-pay | Admitting: Oncology

## 2020-09-02 VITALS — BP 111/63 | HR 79 | Temp 97.8°F | Resp 16 | Wt 232.6 lb

## 2020-09-02 DIAGNOSIS — Z1501 Genetic susceptibility to malignant neoplasm of breast: Secondary | ICD-10-CM | POA: Diagnosis not present

## 2020-09-02 DIAGNOSIS — Z1509 Genetic susceptibility to other malignant neoplasm: Secondary | ICD-10-CM | POA: Diagnosis not present

## 2020-09-02 DIAGNOSIS — C786 Secondary malignant neoplasm of retroperitoneum and peritoneum: Secondary | ICD-10-CM | POA: Diagnosis not present

## 2020-09-02 DIAGNOSIS — C563 Malignant neoplasm of bilateral ovaries: Secondary | ICD-10-CM | POA: Diagnosis not present

## 2020-09-02 DIAGNOSIS — J910 Malignant pleural effusion: Secondary | ICD-10-CM

## 2020-09-02 DIAGNOSIS — C763 Malignant neoplasm of pelvis: Secondary | ICD-10-CM

## 2020-09-02 DIAGNOSIS — G629 Polyneuropathy, unspecified: Secondary | ICD-10-CM | POA: Diagnosis not present

## 2020-09-02 DIAGNOSIS — D696 Thrombocytopenia, unspecified: Secondary | ICD-10-CM | POA: Diagnosis not present

## 2020-09-02 DIAGNOSIS — Z5111 Encounter for antineoplastic chemotherapy: Secondary | ICD-10-CM | POA: Diagnosis not present

## 2020-09-02 DIAGNOSIS — L259 Unspecified contact dermatitis, unspecified cause: Secondary | ICD-10-CM | POA: Diagnosis not present

## 2020-09-02 DIAGNOSIS — Z148 Genetic carrier of other disease: Secondary | ICD-10-CM | POA: Diagnosis not present

## 2020-09-02 DIAGNOSIS — D649 Anemia, unspecified: Secondary | ICD-10-CM | POA: Diagnosis not present

## 2020-09-02 DIAGNOSIS — C5700 Malignant neoplasm of unspecified fallopian tube: Secondary | ICD-10-CM

## 2020-09-02 LAB — CBC WITH DIFFERENTIAL/PLATELET
Abs Immature Granulocytes: 0.02 10*3/uL (ref 0.00–0.07)
Basophils Absolute: 0 10*3/uL (ref 0.0–0.1)
Basophils Relative: 1 %
Eosinophils Absolute: 0 10*3/uL (ref 0.0–0.5)
Eosinophils Relative: 1 %
HCT: 30.6 % — ABNORMAL LOW (ref 36.0–46.0)
Hemoglobin: 9.7 g/dL — ABNORMAL LOW (ref 12.0–15.0)
Immature Granulocytes: 1 %
Lymphocytes Relative: 35 %
Lymphs Abs: 1.5 10*3/uL (ref 0.7–4.0)
MCH: 29.7 pg (ref 26.0–34.0)
MCHC: 31.7 g/dL (ref 30.0–36.0)
MCV: 93.6 fL (ref 80.0–100.0)
Monocytes Absolute: 0.5 10*3/uL (ref 0.1–1.0)
Monocytes Relative: 11 %
Neutro Abs: 2.3 10*3/uL (ref 1.7–7.7)
Neutrophils Relative %: 51 %
Platelets: 143 10*3/uL — ABNORMAL LOW (ref 150–400)
RBC: 3.27 MIL/uL — ABNORMAL LOW (ref 3.87–5.11)
RDW: 15.9 % — ABNORMAL HIGH (ref 11.5–15.5)
WBC: 4.4 10*3/uL (ref 4.0–10.5)
nRBC: 0 % (ref 0.0–0.2)

## 2020-09-02 LAB — COMPREHENSIVE METABOLIC PANEL
ALT: 13 U/L (ref 0–44)
AST: 18 U/L (ref 15–41)
Albumin: 4.1 g/dL (ref 3.5–5.0)
Alkaline Phosphatase: 45 U/L (ref 38–126)
Anion gap: 10 (ref 5–15)
BUN: 12 mg/dL (ref 8–23)
CO2: 27 mmol/L (ref 22–32)
Calcium: 9.2 mg/dL (ref 8.9–10.3)
Chloride: 103 mmol/L (ref 98–111)
Creatinine, Ser: 0.67 mg/dL (ref 0.44–1.00)
GFR, Estimated: >60 mL/min (ref >60)
Glucose, Bld: 127 mg/dL — ABNORMAL HIGH (ref 70–99)
Potassium: 3.9 mmol/L (ref 3.5–5.1)
Sodium: 140 mmol/L (ref 135–145)
Total Bilirubin: 0.6 mg/dL (ref 0.3–1.2)
Total Protein: 7.3 g/dL (ref 6.5–8.1)

## 2020-09-02 NOTE — Progress Notes (Signed)
Neuropathy is stable with worse feeling at night.  For the past couple of days she has been seeing floaters.  Does have occasional dull abdominal pain.

## 2020-09-02 NOTE — Progress Notes (Signed)
Hematology/Oncology Follow Up Note [facility]  [phone] [phone]  Patient Care Team: Burnard Hawthorne, FNP as PCP - General (Family Medicine) Clent Jacks, RN as Oncology Nurse Navigator   Name of the patient: Linda Schmitt  [id number]  [birth year]   REASON FOR VISIT  follow-up for serous carcinoma  PERTINENT ONCOLOGY HISTORY Linda Schmitt is a 65 y.o.afemale who has above oncology history reviewed by me today presented for follow up visit for management of malignant pleural effusion status post thoracentesis x2 during her recent admission. Cytology was positive for metastatic carcinoma.  TTF-1 Napsin A, GATA3, CDX2 were negative. Positive for PAX 8, which is most often expressing tumors of gynecological and renal origin. PET scan was independently reviewed by me and discussed with patient.  I also discussed with radiology. Exam showed evidence of peritoneal disease within the abdomen, soft tissue infiltrating into the omentum.  Large loculated left pleural effusion with thin FDG avid rind of soft tissue overlying the left lung.  Small right pleural effusion with mild FDG uptake is equivocal for malignant effusion of the right hemithorax. Fluid density structure within the right side of the pelvis abutting the right-sided of uterus is noted.  This is indeterminate.  Further investigation with pelvic sonogram may be helpful.  Pelvic ultrasound was obtained.-Heterogeneous myometrium with poor definition of endometrial complex. Normal left ovary.  No cystic right adnexal lesion is identified.  No a probable small normal-appearing right ovary is identified.  Suspect that the low-attenuation presumed cystic structure seen in the right adnexa on the prior image corresponds to a small amount of nonspecific free fluid in the right adnexa. I discussed with gynecology oncology Dr.Secord.  Given that cytology from left pleural effusion is  positive for metastatic carcinoma, tumor cells are positive for PAX 8 which indicates GYN origin.  Given that patient has moderate to large amount of pleural effusion, heavy tumor burden, recommend to start chemotherapy as soon as possible with carboplatin AUC 5-6 and Taxol 175 mg/m every 3 weeks for 3 cycles.   #Pre-existing neuropathy of left hand, she takes gabapentin. # seen by Dr. Theora Gianotti on 04/30/2020 and she recommends to proceed with the fourth cycle of chemotherapy and patient will be seen by Dr. Theora Gianotti again on 05/27/2020 for debulking surgery plan on 06/05/2020 with cardiothoracic surgery for thoracoscopy possible thoracic tumor debulking.  Patient was recommended to have to repeat CT of the chest after her chemotherapy.  Oncologic chemotherapy treatments 03/04/2020- 9/28/201 4 cycles of neoadjuvant carbo and Taxol  06/05/2020, status post Total hysterectomy and bilateral salpingo-oophorectomy, Omentectomy, and Optimal (R<1) interval tumor debulking. A. Peritoneal nodule, excisional biopsy: Fibroadipose tissue with calcifications, negative for tumor. B. Uterus, bilateral ovaries and fallopian tubes, hysterectomy and bilateral salpingo-oophorectomy: Cervix: Squamous metaplasia. Endometrium: Inactive endometrium. Myometrium: Adenomyosis. Serosa: High grade serous adenocarcinoma. Right ovary: High grade serous adenocarcinoma, involving the ovarian surface and parenchyma. Right fallopian tube: No specific pathologic change. Left ovary: High grade serous adenocarcinoma, involving the ovarian surface and parenchyma. Left fallopian tube: High grade serous adenocarcinoma, involving tubal fimbria. Serous tubal intraepithelial carcinoma. Immunohistochemistry was performed on block B14 at [facility] to characterize the pathologic process and demonstrates the following immunophenotype in the cells of interest:POSITIVE:  p53, p16 (strong, patchy), Ki67 (elevated) This staining profile supports  the above diagnosis. C. Peritoneal nodule, left pericolic gutter, excisional biopsy: High grade serous adenocarcinoma. D. Cul-de-sac, excisional biopsy: High grade serous adenocarcinoma. E. Omentum, excision: High grade serous adenocarcinoma (  up to 3.0 cm).  TUMOR  Tumor Site:  Left fallopian tube  Histologic Type:  Serous carcinoma  Histologic Grade:  High grade  Tumor Size:  Greatest Dimension (Centimeters): 0.2 cm  Ovarian Surface Involvement:  Present   Laterality:  Bilateral  Fallopian Tube Surface Involvement:  Present   Laterality:  Left  Implants:  Present (sites): left peritoneum, cul-de-sac, omentum  Other Tissue / Organ Involvement:  Right ovary  Other Tissue / Organ Involvement:  Left ovary  Other Tissue / Organ Involvement:  Left fallopian tube  Other Tissue / Organ Involvement:  Pelvic peritoneum  Other Tissue / Organ Involvement:  Omentum  Largest Extrapelvic Peritoneal Focus:  Macroscopic (greater than 2 cm)  Peritoneal / Ascitic Fluid:  Not submitted / unknown  Pleural Fluid:  Not submitted / unknown  Treatment Effect:  Moderate response identified (CRS 2)   LYMPH NODES  Regional Lymph Nodes:  No lymph nodes submitted or found   PATHOLOGIC STAGE CLASSIFICATION (pTNM, AJCC 8th Edition)  TNM Descriptors:  y (post-treatment)  Primary Tumor (pT):  pT3c  Regional Lymph Nodes (pN):  pNX FIGO Stage:  IIIC   + pleural effusion so STAGE IV No germline BRCA mutation, -Homologous recombination deficiency positive.  Myriad Genomic instability score:  positive, somatic BRCA1 positive  07/01/20- 08/12/2020 3 cycles of adjuvant chemotherapy with carboplatin AUC 5, Taxol 175 mg/m.  INTERVAL HISTORY 65 year old female presents for follow-up of Ovarian cancer,  somatic BRCA1 positive and HRD positive.  07/01/2020, adjuvant chemotherapy, and Taxol.  She tolerates well. Worsened pre-existing neuropathy,  finger tips and toes.   She takes gabapentin $RemoveBeforeDE'300mg'[id number]$  1-2 times during the day and $Remov'600mg'iQdppl$  QHS.  Denies any nausea vomiting diarrhea. No other new complaints.   Review of Systems  Constitutional: Negative for appetite change, chills, fatigue and fever.  HENT:   Negative for hearing loss and voice change.   Eyes: Negative for eye problems.  Respiratory: Negative for chest tightness and cough.   Cardiovascular: Negative for chest pain.  Gastrointestinal: Negative for abdominal distention, abdominal pain and blood in stool.  Endocrine: Negative for hot flashes.  Genitourinary: Negative for difficulty urinating and frequency.   Musculoskeletal: Negative for arthralgias.  Skin: Positive for rash. Negative for itching.  Neurological: Positive for numbness. Negative for extremity weakness.       Pre-existing neuropathy, worse.   Hematological: Negative for adenopathy.  Psychiatric/Behavioral: Negative for confusion.      No Known Allergies   Past Medical History:  Diagnosis Date  . Family history of breast cancer   . Genetic testing 04/09/2020   No pathogenic variants identified on the Myriad MyRisk (germline) panel. The report date is 04/07/2020.   The [facility] gene panel offered by [employer] includes sequencing and deletion/duplication testing of the following 35 genes: APC, ATM, AXIN2, BARD1, BMPR1A, BRCA1, BRCA2, BRIP1, CHD1, CDK4, CDKN2A, CHEK2, EPCAM (large rearrangement only), HOXB13, GALNT12, MLH1, MSH2, MSH3, MSH6  . HLD (hyperlipidemia)      Past Surgical History:  Procedure Laterality Date  . CARPAL TUNNEL RELEASE Left   . ROBOTIC ASSISTED TOTAL HYSTERECTOMY WITH BILATERAL SALPINGO OOPHERECTOMY  06/05/2020   with omentectomy tumor debulking via mini-laparotomy/hand assisted port  . SHOULDER ARTHROSCOPY WITH SUBACROMIAL DECOMPRESSION AND OPEN ROTATOR C Right 02/01/2020   Procedure: RIGHT SHOULDER ARTHROSCOPIC SUBSCAPULARIS REPAIR, ROTATOR CUFF REPAIR, SUABCROMIAL  DECOMPRESSION, AND BICEPS TENODESIS.;  Surgeon: Leim Fabry, MD;  Location: [facility] ORS;  Service: Orthopedics;  Laterality: Right;  . TUBAL LIGATION  Social History   Socioeconomic History  . Marital status: Single    Spouse name: Not on file  . Number of children: Not on file  . Years of education: Not on file  . Highest education level: Not on file  Occupational History  . Not on file  Tobacco Use  . Smoking status: Former Smoker    Packs/day: 0.75    Years: 40.00    Pack years: 30.00    Types: Cigarettes    Quit date: 02/06/2018    Years since quitting: 2.5  . Smokeless tobacco: Never Used  . Tobacco comment: quit 02/2018  Vaping Use  . Vaping Use: Never used  Substance and Sexual Activity  . Alcohol use: Yes    Comment: 2-3 glasses of wine per week  . Drug use: No  . Sexual activity: Not on file  Other Topics Concern  . Not on file  Social History Narrative   Lives in [place] alone. Work - [employer]   Diet: Regular   Exercise: walking   Social Determinants of Radio broadcast assistant Strain: Not on file  Food Insecurity: No Landscape architect  . Worried About Charity fundraiser in the Last Year: Never true  . Ran Out of Food in the Last Year: Never true  Transportation Needs: No Transportation Needs  . Lack of Transportation (Medical): No  . Lack of Transportation (Non-Medical): No  Physical Activity: Not on file  Stress: No Stress Concern Present  . Feeling of Stress : Not at all  Social Connections: Not on file  Intimate Partner Violence: Not At Risk  . Fear of Current or Ex-Partner: No  . Emotionally Abused: No  . Physically Abused: No  . Sexually Abused: No    Family History  Problem Relation Age of Onset  . Cancer Mother        lymphoma  . Stroke Sister   . Cancer Brother        lung  . Cancer Sister        lung  . Lung cancer Brother   . Bone cancer Sister   . Breast cancer Cousin 60       maternal  . Cancer Maternal Aunt        unk  types     Current Outpatient Medications:  .  acetaminophen (TYLENOL) 500 MG tablet, Take 2 tablets (1,000 mg total) by mouth every 8 (eight) hours., Disp: 90 tablet, Rfl: 2 .  cholecalciferol (VITAMIN D3) 10 MCG (400 UNIT) TABS tablet, Take 800 Units by mouth daily., Disp: , Rfl:  .  cyanocobalamin 1000 MCG tablet, Take 400 mcg by mouth daily., Disp: , Rfl:  .  gabapentin (NEURONTIN) 300 MG capsule, Take 1 capsule (300 mg total) by mouth 2 (two) times daily. (Patient taking differently: Take 300 mg by mouth 2 (two) times daily. 1 QAM 2 QHS, 1 tab mid day prn), Disp: 60 capsule, Rfl: 2 .  potassium chloride SA (KLOR-CON) 20 MEQ tablet, Take 1 tablet (20 mEq total) by mouth daily., Disp: 2 tablet, Rfl: 0 .  olaparib (LYNPARZA) 150 MG tablet, Take 2 tablets (300 mg total) by mouth 2 (two) times daily. Swallow whole. May take with food to decrease nausea and vomiting. (Patient not taking: Reported on 09/02/2020), Disp: 120 tablet, Rfl: 1 .  pravastatin (PRAVACHOL) 40 MG tablet, TAKE 1 TABLET(40 MG) BY MOUTH DAILY (Patient not taking: Reported on 09/02/2020), Disp: 90 tablet, Rfl: 0  Physical exam:  Vitals:  09/02/20 1057  BP: 111/63  Pulse: 79  Resp: 16  Temp: 97.8 F (36.6 C)  Weight: 232 lb 9.6 oz (105.5 kg)   Physical Exam Constitutional:      General: She is not in acute distress. HENT:     Head: Normocephalic and atraumatic.  Eyes:     General: No scleral icterus. Cardiovascular:     Rate and Rhythm: Normal rate and regular rhythm.     Heart sounds: Normal heart sounds.  Pulmonary:     Effort: Pulmonary effort is normal. No respiratory distress.     Breath sounds: No wheezing.     Comments: Diminished breathing sounds.  left lower 1/3  Abdominal:     General: Bowel sounds are normal. There is no distension.     Palpations: Abdomen is soft.  Musculoskeletal:        General: No deformity. Normal range of motion.     Cervical back: Normal range of motion and neck supple.   Skin:    General: Skin is warm and dry.     Findings: No erythema or rash.  Neurological:     Mental Status: She is alert and oriented to person, place, and time. Mental status is at baseline.     Cranial Nerves: No cranial nerve deficit.     Coordination: Coordination normal.  Psychiatric:        Mood and Affect: Mood normal.         CMP Latest Ref Rng & Units 09/02/2020  Glucose 70 - 99 mg/dL 127(H)  BUN 8 - 23 mg/dL 12  Creatinine 0.44 - 1.00 mg/dL 0.67  Sodium 135 - 145 mmol/L 140  Potassium 3.5 - 5.1 mmol/L 3.9  Chloride 98 - 111 mmol/L 103  CO2 22 - 32 mmol/L 27  Calcium 8.9 - 10.3 mg/dL 9.2  Total Protein 6.5 - 8.1 g/dL 7.3  Total Bilirubin 0.3 - 1.2 mg/dL 0.6  Alkaline Phos 38 - 126 U/L 45  AST 15 - 41 U/L 18  ALT 0 - 44 U/L 13   CBC Latest Ref Rng & Units 09/02/2020  WBC 4.0 - 10.5 K/uL 4.4  Hemoglobin 12.0 - 15.0 g/dL 9.7(L)  Hematocrit 36.0 - 46.0 % 30.6(L)  Platelets 150 - 400 K/uL 143(L)    RADIOGRAPHIC STUDIES: I have personally reviewed the radiological images as listed and agreed with the findings in the report. No results found.   Assessment and plan Patient is a 65 y.o. female presents for follow-up for FIGO IIIC ovarian cancer, ( somatic BRCA1 positive, HRD positive). 1. Encounter for antineoplastic chemotherapy   2. Carcinoma of fallopian tube, unspecified laterality ([place])   3. Malignant pleural effusion   4. Normocytic anemia   5. Neuropathy    #FIGO IVA Fallopian tube serous carcinoma- + pleural effusion cytology S/p 4 cycles of neoadjuvant chemotherapy  Carboplatin /Taxol  S/p total hysterectomy with salpingo-oophorectomy and omentectomy. Optimal (R<1) interval tumor debulking. S/p 3 cycles of adjuvant carbopltain  Taxol  # HRD, recommend Olaparib 300mg  BID maintenance.  Rationale and potential side effects were discussed with patient in details and she agrees with the plan.  Monitor CA125.   # pre-existing neuropathy, grade 2,  continue gabapentin.  Continue 300mg  1-2 tablets during the day and 600mg  QHS . #Anemia, likely secondary to recent surgery.  Hemoglobin is 9.7, slightly worse, I recommend her to wait until next week to start Olarparib.  #Mild thrombocytopenia, count is stable.  Monitor. # Malignant pleural effusion, symptom  is stable.   We spent sufficient time to discuss many aspect of care, questions were answered to patient's satisfaction.  Follow-up in 3 weeks   Earlie Server, MD, PhD Hematology Oncology [facility] at [facility] Pager- [phone] 09/02/2020

## 2020-09-03 LAB — CA 125: Cancer Antigen (CA) 125: 4.3 U/mL (ref 0.0–38.1)

## 2020-09-20 ENCOUNTER — Other Ambulatory Visit: Payer: Self-pay | Admitting: Family

## 2020-09-20 DIAGNOSIS — E785 Hyperlipidemia, unspecified: Secondary | ICD-10-CM

## 2020-09-23 ENCOUNTER — Inpatient Hospital Stay: Payer: Medicare HMO

## 2020-09-23 ENCOUNTER — Inpatient Hospital Stay: Payer: Medicare HMO | Attending: Oncology | Admitting: Oncology

## 2020-09-23 ENCOUNTER — Inpatient Hospital Stay: Payer: Medicare HMO | Admitting: Pharmacist

## 2020-09-23 ENCOUNTER — Encounter: Payer: Self-pay | Admitting: Oncology

## 2020-09-23 VITALS — BP 112/69 | HR 78 | Temp 98.4°F | Resp 18 | Wt 233.5 lb

## 2020-09-23 DIAGNOSIS — C5700 Malignant neoplasm of unspecified fallopian tube: Secondary | ICD-10-CM

## 2020-09-23 DIAGNOSIS — C763 Malignant neoplasm of pelvis: Secondary | ICD-10-CM

## 2020-09-23 DIAGNOSIS — Z5111 Encounter for antineoplastic chemotherapy: Secondary | ICD-10-CM

## 2020-09-23 DIAGNOSIS — G620 Drug-induced polyneuropathy: Secondary | ICD-10-CM | POA: Insufficient documentation

## 2020-09-23 DIAGNOSIS — Z87891 Personal history of nicotine dependence: Secondary | ICD-10-CM | POA: Insufficient documentation

## 2020-09-23 DIAGNOSIS — J910 Malignant pleural effusion: Secondary | ICD-10-CM

## 2020-09-23 DIAGNOSIS — Z79899 Other long term (current) drug therapy: Secondary | ICD-10-CM | POA: Diagnosis not present

## 2020-09-23 DIAGNOSIS — D649 Anemia, unspecified: Secondary | ICD-10-CM | POA: Diagnosis not present

## 2020-09-23 DIAGNOSIS — G629 Polyneuropathy, unspecified: Secondary | ICD-10-CM

## 2020-09-23 LAB — CBC WITH DIFFERENTIAL/PLATELET
Abs Immature Granulocytes: 0.02 10*3/uL (ref 0.00–0.07)
Basophils Absolute: 0 10*3/uL (ref 0.0–0.1)
Basophils Relative: 0 %
Eosinophils Absolute: 0.1 10*3/uL (ref 0.0–0.5)
Eosinophils Relative: 1 %
HCT: 31.1 % — ABNORMAL LOW (ref 36.0–46.0)
Hemoglobin: 10.1 g/dL — ABNORMAL LOW (ref 12.0–15.0)
Immature Granulocytes: 0 %
Lymphocytes Relative: 28 %
Lymphs Abs: 1.4 10*3/uL (ref 0.7–4.0)
MCH: 30.5 pg (ref 26.0–34.0)
MCHC: 32.5 g/dL (ref 30.0–36.0)
MCV: 94 fL (ref 80.0–100.0)
Monocytes Absolute: 0.4 10*3/uL (ref 0.1–1.0)
Monocytes Relative: 9 %
Neutro Abs: 3.1 10*3/uL (ref 1.7–7.7)
Neutrophils Relative %: 62 %
Platelets: 160 10*3/uL (ref 150–400)
RBC: 3.31 MIL/uL — ABNORMAL LOW (ref 3.87–5.11)
RDW: 17.1 % — ABNORMAL HIGH (ref 11.5–15.5)
WBC: 5 10*3/uL (ref 4.0–10.5)
nRBC: 0 % (ref 0.0–0.2)

## 2020-09-23 LAB — COMPREHENSIVE METABOLIC PANEL
ALT: 14 U/L (ref 0–44)
AST: 19 U/L (ref 15–41)
Albumin: 4.2 g/dL (ref 3.5–5.0)
Alkaline Phosphatase: 47 U/L (ref 38–126)
Anion gap: 12 (ref 5–15)
BUN: 9 mg/dL (ref 8–23)
CO2: 25 mmol/L (ref 22–32)
Calcium: 9.4 mg/dL (ref 8.9–10.3)
Chloride: 102 mmol/L (ref 98–111)
Creatinine, Ser: 0.91 mg/dL (ref 0.44–1.00)
GFR, Estimated: >60 mL/min (ref >60)
Glucose, Bld: 116 mg/dL — ABNORMAL HIGH (ref 70–99)
Potassium: 3.7 mmol/L (ref 3.5–5.1)
Sodium: 139 mmol/L (ref 135–145)
Total Bilirubin: 0.8 mg/dL (ref 0.3–1.2)
Total Protein: 7.8 g/dL (ref 6.5–8.1)

## 2020-09-23 MED ORDER — NORTRIPTYLINE HCL 10 MG PO CAPS: 10.0000 mg | ORAL_CAPSULE | Freq: Every day | ORAL | 0 refills | Status: DC

## 2020-09-23 NOTE — Addendum Note (Signed)
Addended by: Earlie Server on: 09/23/2020 08:42 PM   Modules accepted: Orders

## 2020-09-23 NOTE — Progress Notes (Signed)
Hematology/Oncology Follow Up Note [facility]  [phone] [phone]  Patient Care Team: Burnard Hawthorne, FNP as PCP - General (Family Medicine) Clent Jacks, RN as Oncology Nurse Navigator   Name of the patient: Linda Schmitt  [id number]  [birth year]   REASON FOR VISIT  follow-up for serous carcinoma  PERTINENT ONCOLOGY HISTORY Linda Schmitt is a 65 y.o.afemale who has above oncology history reviewed by me today presented for follow up visit for management of malignant pleural effusion status post thoracentesis x2 during her recent admission. Cytology was positive for metastatic carcinoma.  TTF-1 Napsin A, GATA3, CDX2 were negative. Positive for PAX 8, which is most often expressing tumors of gynecological and renal origin. PET scan was independently reviewed by me and discussed with patient.  I also discussed with radiology. Exam showed evidence of peritoneal disease within the abdomen, soft tissue infiltrating into the omentum.  Large loculated left pleural effusion with thin FDG avid rind of soft tissue overlying the left lung.  Small right pleural effusion with mild FDG uptake is equivocal for malignant effusion of the right hemithorax. Fluid density structure within the right side of the pelvis abutting the right-sided of uterus is noted.  This is indeterminate.  Further investigation with pelvic sonogram may be helpful.  Pelvic ultrasound was obtained.-Heterogeneous myometrium with poor definition of endometrial complex. Normal left ovary.  No cystic right adnexal lesion is identified.  No a probable small normal-appearing right ovary is identified.  Suspect that the low-attenuation presumed cystic structure seen in the right adnexa on the prior image corresponds to a small amount of nonspecific free fluid in the right adnexa. I discussed with gynecology oncology Dr.Secord.  Given that cytology from left pleural effusion is  positive for metastatic carcinoma, tumor cells are positive for PAX 8 which indicates GYN origin.  Given that patient has moderate to large amount of pleural effusion, heavy tumor burden, recommend to start chemotherapy as soon as possible with carboplatin AUC 5-6 and Taxol 175 mg/m every 3 weeks for 3 cycles.   #Pre-existing neuropathy of left hand, she takes gabapentin. # seen by Dr. Theora Gianotti on 04/30/2020 and she recommends to proceed with the fourth cycle of chemotherapy and patient will be seen by Dr. Theora Gianotti again on 05/27/2020 for debulking surgery plan on 06/05/2020 with cardiothoracic surgery for thoracoscopy possible thoracic tumor debulking.  Patient was recommended to have to repeat CT of the chest after her chemotherapy.  Oncologic chemotherapy treatments 03/04/2020- 9/28/201 4 cycles of neoadjuvant carbo and Taxol  06/05/2020, status post Total hysterectomy and bilateral salpingo-oophorectomy, Omentectomy, and Optimal (R<1) interval tumor debulking. A. Peritoneal nodule, excisional biopsy: Fibroadipose tissue with calcifications, negative for tumor. B. Uterus, bilateral ovaries and fallopian tubes, hysterectomy and bilateral salpingo-oophorectomy: Cervix: Squamous metaplasia. Endometrium: Inactive endometrium. Myometrium: Adenomyosis. Serosa: High grade serous adenocarcinoma. Right ovary: High grade serous adenocarcinoma, involving the ovarian surface and parenchyma. Right fallopian tube: No specific pathologic change. Left ovary: High grade serous adenocarcinoma, involving the ovarian surface and parenchyma. Left fallopian tube: High grade serous adenocarcinoma, involving tubal fimbria. Serous tubal intraepithelial carcinoma. Immunohistochemistry was performed on block B14 at [facility] to characterize the pathologic process and demonstrates the following immunophenotype in the cells of interest:POSITIVE:  p53, p16 (strong, patchy), Ki67 (elevated) This staining profile supports  the above diagnosis. C. Peritoneal nodule, left pericolic gutter, excisional biopsy: High grade serous adenocarcinoma. D. Cul-de-sac, excisional biopsy: High grade serous adenocarcinoma. E. Omentum, excision: High grade serous adenocarcinoma (  up to 3.0 cm).  TUMOR  Tumor Site:  Left fallopian tube  Histologic Type:  Serous carcinoma  Histologic Grade:  High grade  Tumor Size:  Greatest Dimension (Centimeters): 0.2 cm  Ovarian Surface Involvement:  Present   Laterality:  Bilateral  Fallopian Tube Surface Involvement:  Present   Laterality:  Left  Implants:  Present (sites): left peritoneum, cul-de-sac, omentum  Other Tissue / Organ Involvement:  Right ovary  Other Tissue / Organ Involvement:  Left ovary  Other Tissue / Organ Involvement:  Left fallopian tube  Other Tissue / Organ Involvement:  Pelvic peritoneum  Other Tissue / Organ Involvement:  Omentum  Largest Extrapelvic Peritoneal Focus:  Macroscopic (greater than 2 cm)  Peritoneal / Ascitic Fluid:  Not submitted / unknown  Pleural Fluid:  Not submitted / unknown  Treatment Effect:  Moderate response identified (CRS 2)   LYMPH NODES  Regional Lymph Nodes:  No lymph nodes submitted or found   PATHOLOGIC STAGE CLASSIFICATION (pTNM, AJCC 8th Edition)  TNM Descriptors:  y (post-treatment)  Primary Tumor (pT):  pT3c  Regional Lymph Nodes (pN):  pNX FIGO Stage:  IIIC   + pleural effusion so STAGE IV No germline BRCA mutation, -Homologous recombination deficiency positive.  Myriad Genomic instability score:  positive, somatic BRCA1 positive  07/01/20- 08/12/2020 3 cycles of adjuvant chemotherapy with carboplatin AUC 5, Taxol 175 mg/m. 09/02/2020, started on olaparib 300 mg twice daily. INTERVAL HISTORY 65 year old female presents for follow-up of Ovarian cancer,  somatic BRCA1 positive and HRD positive.  Patient has been on olaparib since January 2022.  So  far tolerates well.  No diarrhea Worsened pre-existing neuropathy, finger tips and toes.   She takes gabapentin $RemoveBeforeDE'300mg'sOmbsLZrESDKFdW$  in a.m., 600 mg at noon and 600 mg, $Remov'600mg'iltMaz$  QHS.  And the symptoms are not improving. No other new complaints.   Review of Systems  Constitutional: Negative for appetite change, chills, fatigue and fever.  HENT:   Negative for hearing loss and voice change.   Eyes: Negative for eye problems.  Respiratory: Negative for chest tightness and cough.   Cardiovascular: Negative for chest pain.  Gastrointestinal: Negative for abdominal distention, abdominal pain and blood in stool.  Endocrine: Negative for hot flashes.  Genitourinary: Negative for difficulty urinating and frequency.   Musculoskeletal: Negative for arthralgias.  Skin: Negative for itching and rash.  Neurological: Positive for numbness. Negative for extremity weakness.       Pre-existing neuropathy, worse.   Hematological: Negative for adenopathy.  Psychiatric/Behavioral: Negative for confusion.      No Known Allergies   Past Medical History:  Diagnosis Date  . Family history of breast cancer   . Genetic testing 04/09/2020   No pathogenic variants identified on the Myriad MyRisk (germline) panel. The report date is 04/07/2020.   The [facility] gene panel offered by [employer] includes sequencing and deletion/duplication testing of the following 35 genes: APC, ATM, AXIN2, BARD1, BMPR1A, BRCA1, BRCA2, BRIP1, CHD1, CDK4, CDKN2A, CHEK2, EPCAM (large rearrangement only), HOXB13, GALNT12, MLH1, MSH2, MSH3, MSH6  . HLD (hyperlipidemia)      Past Surgical History:  Procedure Laterality Date  . CARPAL TUNNEL RELEASE Left   . ROBOTIC ASSISTED TOTAL HYSTERECTOMY WITH BILATERAL SALPINGO OOPHERECTOMY  06/05/2020   with omentectomy tumor debulking via mini-laparotomy/hand assisted port  . SHOULDER ARTHROSCOPY WITH SUBACROMIAL DECOMPRESSION AND OPEN ROTATOR C Right 02/01/2020   Procedure: RIGHT SHOULDER  ARTHROSCOPIC SUBSCAPULARIS REPAIR, ROTATOR CUFF REPAIR, SUABCROMIAL DECOMPRESSION, AND BICEPS TENODESIS.;  Surgeon: Leim Fabry, MD;  Location: [facility] ORS;  Service: Orthopedics;  Laterality: Right;  . TUBAL LIGATION      Social History   Socioeconomic History  . Marital status: Single    Spouse name: Not on file  . Number of children: Not on file  . Years of education: Not on file  . Highest education level: Not on file  Occupational History  . Not on file  Tobacco Use  . Smoking status: Former Smoker    Packs/day: 0.75    Years: 40.00    Pack years: 30.00    Types: Cigarettes    Quit date: 02/06/2018    Years since quitting: 2.6  . Smokeless tobacco: Never Used  . Tobacco comment: quit 02/2018  Vaping Use  . Vaping Use: Never used  Substance and Sexual Activity  . Alcohol use: Yes    Comment: 2-3 glasses of wine per week  . Drug use: No  . Sexual activity: Not on file  Other Topics Concern  . Not on file  Social History Narrative   Lives in [place] alone. Work - [employer]   Diet: Regular   Exercise: walking   Social Determinants of Radio broadcast assistant Strain: Not on file  Food Insecurity: No Landscape architect  . Worried About Charity fundraiser in the Last Year: Never true  . Ran Out of Food in the Last Year: Never true  Transportation Needs: No Transportation Needs  . Lack of Transportation (Medical): No  . Lack of Transportation (Non-Medical): No  Physical Activity: Not on file  Stress: No Stress Concern Present  . Feeling of Stress : Not at all  Social Connections: Not on file  Intimate Partner Violence: Not At Risk  . Fear of Current or Ex-Partner: No  . Emotionally Abused: No  . Physically Abused: No  . Sexually Abused: No    Family History  Problem Relation Age of Onset  . Cancer Mother        lymphoma  . Stroke Sister   . Cancer Brother        lung  . Cancer Sister        lung  . Lung cancer Brother   . Bone cancer Sister   . Breast  cancer Cousin 46       maternal  . Cancer Maternal Aunt        unk types     Current Outpatient Medications:  .  acetaminophen (TYLENOL) 500 MG tablet, Take 2 tablets (1,000 mg total) by mouth every 8 (eight) hours., Disp: 90 tablet, Rfl: 2 .  cholecalciferol (VITAMIN D3) 10 MCG (400 UNIT) TABS tablet, Take 800 Units by mouth daily., Disp: , Rfl:  .  cyanocobalamin 1000 MCG tablet, Take 400 mcg by mouth daily., Disp: , Rfl:  .  gabapentin (NEURONTIN) 300 MG capsule, Take 1 capsule (300 mg total) by mouth 2 (two) times daily. (Patient taking differently: Take 300 mg by mouth 2 (two) times daily. 1 QAM 2 QHS, 1 tab mid day prn), Disp: 60 capsule, Rfl: 2 .  olaparib (LYNPARZA) 150 MG tablet, Take 2 tablets (300 mg total) by mouth 2 (two) times daily. Swallow whole. May take with food to decrease nausea and vomiting., Disp: 120 tablet, Rfl: 1 .  potassium chloride SA (KLOR-CON) 20 MEQ tablet, Take 1 tablet (20 mEq total) by mouth daily., Disp: 2 tablet, Rfl: 0 .  pravastatin (PRAVACHOL) 40 MG tablet, TAKE 1 TABLET(40 MG) BY MOUTH DAILY, Disp: 90 tablet, Rfl:  0  Physical exam:  Vitals:   09/23/20 1016  BP: 112/69  Pulse: 78  Resp: 18  Temp: 98.4 F (36.9 C)  Weight: 233 lb 8 oz (105.9 kg)   Physical Exam Constitutional:      General: She is not in acute distress. HENT:     Head: Normocephalic and atraumatic.  Eyes:     General: No scleral icterus. Cardiovascular:     Rate and Rhythm: Normal rate and regular rhythm.     Heart sounds: Normal heart sounds.  Pulmonary:     Effort: Pulmonary effort is normal. No respiratory distress.     Breath sounds: No wheezing.     Comments: Diminished breathing sounds.  left lower 1/3  Abdominal:     General: Bowel sounds are normal. There is no distension.     Palpations: Abdomen is soft.  Musculoskeletal:        General: No deformity. Normal range of motion.     Cervical back: Normal range of motion and neck supple.  Skin:    General: Skin  is warm and dry.     Findings: No erythema or rash.  Neurological:     Mental Status: She is alert and oriented to person, place, and time. Mental status is at baseline.     Cranial Nerves: No cranial nerve deficit.     Coordination: Coordination normal.  Psychiatric:        Mood and Affect: Mood normal.         CMP Latest Ref Rng & Units 09/23/2020  Glucose 70 - 99 mg/dL 116(H)  BUN 8 - 23 mg/dL 9  Creatinine 0.44 - 1.00 mg/dL 0.91  Sodium 135 - 145 mmol/L 139  Potassium 3.5 - 5.1 mmol/L 3.7  Chloride 98 - 111 mmol/L 102  CO2 22 - 32 mmol/L 25  Calcium 8.9 - 10.3 mg/dL 9.4  Total Protein 6.5 - 8.1 g/dL 7.8  Total Bilirubin 0.3 - 1.2 mg/dL 0.8  Alkaline Phos 38 - 126 U/L 47  AST 15 - 41 U/L 19  ALT 0 - 44 U/L 14   CBC Latest Ref Rng & Units 09/23/2020  WBC 4.0 - 10.5 K/uL 5.0  Hemoglobin 12.0 - 15.0 g/dL 10.1(L)  Hematocrit 36.0 - 46.0 % 31.1(L)  Platelets 150 - 400 K/uL 160    RADIOGRAPHIC STUDIES: I have personally reviewed the radiological images as listed and agreed with the findings in the report. No results found.   Assessment and plan Patient is a 65 y.o. female presents for follow-up for FIGO IIIC ovarian cancer, ( somatic BRCA1 positive, HRD positive). 1. Encounter for antineoplastic chemotherapy   2. Carcinoma of fallopian tube, unspecified laterality ([place])   3. Normocytic anemia   4. Neuropathy    #FIGO IVA Fallopian tube serous carcinoma- + pleural effusion cytology S/p 4 cycles of neoadjuvant chemotherapy  Carboplatin /Taxol  S/p total hysterectomy with salpingo-oophorectomy and omentectomy. Optimal (R<1) interval tumor debulking. S/p 3 cycles of adjuvant carbopltain  Taxol  # HRD, recommend Olaparib 300mg  BID maintenance.  Patient tolerates well.   #Worsening chemotherapy induced neuropathy, not controlled by 1500 mg gabapentin daily. Recommend patient to start on amitriptyline. Add nortriptyline 10mg  QHS.Marland Kitchen She agreed with the plan.  Rx sent to  pharmacy . #Anemia, likely secondary to recent surgery.  Hemoglobin is 10.1 stable.  # Malignant pleural effusion, symptom is stable.   We spent sufficient time to discuss many aspect of care, questions were answered to patient's satisfaction.  Follow-up in 3 weeks   Earlie Server, MD, PhD Hematology Oncology [facility] at [facility] Pager- [phone] 09/23/2020

## 2020-09-23 NOTE — Progress Notes (Signed)
The Gabapentin does not help with neuropathy.

## 2020-09-23 NOTE — Progress Notes (Signed)
[facility]  [phone] Fax:([phone]  Patient Care Team: Burnard Hawthorne, FNP as PCP - General (Family Medicine) Clent Jacks, RN as Oncology Nurse Navigator   Name of the patient: Linda Schmitt  [id number]  [birth year]   Date of visit: 09/23/20  HPI: Patient is a 65 y.o. female with IVA fallopian tube carcinoma. Patient recently started on Lynparza (olaparib) for maintenance therapy following completion of her platinum based chemotherapy.   Reason for Consult: Oral chemotherapy follow-up for Lynparza (olaparib) therapy.   PAST MEDICAL HISTORY: Past Medical History:  Diagnosis Date  . Family history of breast cancer   . Genetic testing 04/09/2020   No pathogenic variants identified on the Myriad MyRisk (germline) panel. The report date is 04/07/2020.   The [facility] gene panel offered by [employer] includes sequencing and deletion/duplication testing of the following 35 genes: APC, ATM, AXIN2, BARD1, BMPR1A, BRCA1, BRCA2, BRIP1, CHD1, CDK4, CDKN2A, CHEK2, EPCAM (large rearrangement only), HOXB13, GALNT12, MLH1, MSH2, MSH3, MSH6  . HLD (hyperlipidemia)     PAST SURGICAL HISTORY:  Past Surgical History:  Procedure Laterality Date  . CARPAL TUNNEL RELEASE Left   . ROBOTIC ASSISTED TOTAL HYSTERECTOMY WITH BILATERAL SALPINGO OOPHERECTOMY  06/05/2020   with omentectomy tumor debulking via mini-laparotomy/hand assisted port  . SHOULDER ARTHROSCOPY WITH SUBACROMIAL DECOMPRESSION AND OPEN ROTATOR C Right 02/01/2020   Procedure: RIGHT SHOULDER ARTHROSCOPIC SUBSCAPULARIS REPAIR, ROTATOR CUFF REPAIR, SUABCROMIAL DECOMPRESSION, AND BICEPS TENODESIS.;  Surgeon: Leim Fabry, MD;  Location: [facility] ORS;  Service: Orthopedics;  Laterality: Right;  . TUBAL LIGATION      HEMATOLOGY/ONCOLOGY HISTORY:  Oncology History  Carcinoma of fallopian tube ([place])  02/18/2020 Cancer Staging   Staging form: Ovary, Fallopian  Tube, and Primary Peritoneal Carcinoma, AJCC 8th Edition - Pathologic stage from 02/18/2020: FIGO Stage IVA (pM1a) - Signed by Earlie Server, MD on 08/12/2020   06/25/2020 Initial Diagnosis   Carcinoma of fallopian tube ([place])     ALLERGIES:  has No Known Allergies.  MEDICATIONS:  Current Outpatient Medications  Medication Sig Dispense Refill  . acetaminophen (TYLENOL) 500 MG tablet Take 2 tablets (1,000 mg total) by mouth every 8 (eight) hours. 90 tablet 2  . cholecalciferol (VITAMIN D3) 10 MCG (400 UNIT) TABS tablet Take 800 Units by mouth daily.    . cyanocobalamin 1000 MCG tablet Take 400 mcg by mouth daily.    Marland Kitchen gabapentin (NEURONTIN) 300 MG capsule Take 1 capsule (300 mg total) by mouth 2 (two) times daily. (Patient taking differently: Take 300 mg by mouth 2 (two) times daily. 1 QAM 2 QHS, 1 tab mid day prn) 60 capsule 2  . olaparib (LYNPARZA) 150 MG tablet Take 2 tablets (300 mg total) by mouth 2 (two) times daily. Swallow whole. May take with food to decrease nausea and vomiting. 120 tablet 1  . potassium chloride SA (KLOR-CON) 20 MEQ tablet Take 1 tablet (20 mEq total) by mouth daily. 2 tablet 0  . pravastatin (PRAVACHOL) 40 MG tablet TAKE 1 TABLET(40 MG) BY MOUTH DAILY 90 tablet 0   No current facility-administered medications for this visit.    VITAL SIGNS: There were no vitals taken for this visit. There were no vitals filed for this visit.  Estimated body mass index is 41.36 kg/m as calculated from the following:   Height as of 07/14/20: 5' 3"  (1.6 m).   Weight as of an earlier encounter on 09/23/20: 105.9 kg (233 lb 8 oz).  LABS: CBC:  Component Value Date/Time   WBC 5.0 09/23/2020 0950   HGB 10.1 (L) 09/23/2020 0950   HCT 31.1 (L) 09/23/2020 0950   PLT 160 09/23/2020 0950   MCV 94.0 09/23/2020 0950   NEUTROABS 3.1 09/23/2020 0950   LYMPHSABS 1.4 09/23/2020 0950   MONOABS 0.4 09/23/2020 0950   EOSABS 0.1 09/23/2020 0950   BASOSABS 0.0 09/23/2020 0950    Comprehensive Metabolic Panel:    Component Value Date/Time   NA 140 09/02/2020 1018   K 3.9 09/02/2020 1018   CL 103 09/02/2020 1018   CO2 27 09/02/2020 1018   BUN 12 09/02/2020 1018   CREATININE 0.67 09/02/2020 1018   GLUCOSE 127 (H) 09/02/2020 1018   CALCIUM 9.2 09/02/2020 1018   AST 18 09/02/2020 1018   ALT 13 09/02/2020 1018   ALKPHOS 45 09/02/2020 1018   BILITOT 0.6 09/02/2020 1018   PROT 7.3 09/02/2020 1018   ALBUMIN 4.1 09/02/2020 1018    RADIOGRAPHIC STUDIES: No results found.   Assessment and Plan-  CBC from today reviewed. Continue olaparib 348m twice daily   Oral Chemotherapy Side Effect/Intolerance: potential side effects reviewed, none reported. Reviewed the management of nausea and constipation if they were to occur.  Oral Chemotherapy Adherence: patient reported one dose missed in the two weeks since she started on her olaparib. She was at a family event and forgot to take her evening dose when she returned home. She takes her olaparib around 8 am and 8pm each day  Medication Access Issues: none, patient receives her medication from [facility]. They have delivered her next refill of medication last week. Patient will likely need another refill sent in at he next visit.  No patient barriers to medication adherence identified.   Patient expressed understanding and was in agreement with this plan. She also understands that She can call clinic at any time with any questions, concerns, or complaints.   Other: - Hair regrowth: She knows that olaparib is not document as causing hair loss and she may just need some more time from her IV therapy before she starts to see hair regrowth.  Thank you for allowing me to participate in the care of this very pleasant patient.   Time Total: 10 mins  Visit consisted of counseling and education on dealing with issues of symptom management in the setting of serious and potentially life-threatening illness.Greater than 50%  of  this time was spent counseling and coordinating care related to the above assessment and plan.  Signed by: ADarl Pikes PharmD, BCPS, BSalley Slaughter CPP Hematology/Oncology Clinical Pharmacist Practitioner [phone] 09/23/2020 11:14 AM

## 2020-09-24 LAB — CA 125: Cancer Antigen (CA) 125: 4.7 U/mL (ref 0.0–38.1)

## 2020-10-21 ENCOUNTER — Inpatient Hospital Stay (HOSPITAL_BASED_OUTPATIENT_CLINIC_OR_DEPARTMENT_OTHER): Payer: Medicare HMO | Admitting: Oncology

## 2020-10-21 ENCOUNTER — Inpatient Hospital Stay: Payer: Medicare HMO | Attending: Oncology

## 2020-10-21 ENCOUNTER — Encounter: Payer: Self-pay | Admitting: Oncology

## 2020-10-21 ENCOUNTER — Encounter (INDEPENDENT_AMBULATORY_CARE_PROVIDER_SITE_OTHER): Payer: Self-pay

## 2020-10-21 VITALS — BP 127/68 | HR 73 | Temp 98.0°F | Resp 18 | Wt 237.3 lb

## 2020-10-21 DIAGNOSIS — E785 Hyperlipidemia, unspecified: Secondary | ICD-10-CM | POA: Diagnosis not present

## 2020-10-21 DIAGNOSIS — Z90722 Acquired absence of ovaries, bilateral: Secondary | ICD-10-CM | POA: Diagnosis not present

## 2020-10-21 DIAGNOSIS — R109 Unspecified abdominal pain: Secondary | ICD-10-CM | POA: Diagnosis not present

## 2020-10-21 DIAGNOSIS — C579 Malignant neoplasm of female genital organ, unspecified: Secondary | ICD-10-CM | POA: Insufficient documentation

## 2020-10-21 DIAGNOSIS — D649 Anemia, unspecified: Secondary | ICD-10-CM

## 2020-10-21 DIAGNOSIS — Z5111 Encounter for antineoplastic chemotherapy: Secondary | ICD-10-CM

## 2020-10-21 DIAGNOSIS — G629 Polyneuropathy, unspecified: Secondary | ICD-10-CM

## 2020-10-21 DIAGNOSIS — Z90710 Acquired absence of both cervix and uterus: Secondary | ICD-10-CM | POA: Insufficient documentation

## 2020-10-21 DIAGNOSIS — C5700 Malignant neoplasm of unspecified fallopian tube: Secondary | ICD-10-CM

## 2020-10-21 DIAGNOSIS — Z79899 Other long term (current) drug therapy: Secondary | ICD-10-CM | POA: Diagnosis not present

## 2020-10-21 DIAGNOSIS — J910 Malignant pleural effusion: Secondary | ICD-10-CM | POA: Insufficient documentation

## 2020-10-21 DIAGNOSIS — K5900 Constipation, unspecified: Secondary | ICD-10-CM | POA: Diagnosis not present

## 2020-10-21 DIAGNOSIS — R971 Elevated cancer antigen 125 [CA 125]: Secondary | ICD-10-CM | POA: Diagnosis not present

## 2020-10-21 DIAGNOSIS — Z87891 Personal history of nicotine dependence: Secondary | ICD-10-CM | POA: Diagnosis not present

## 2020-10-21 LAB — CBC WITH DIFFERENTIAL/PLATELET
Abs Immature Granulocytes: 0.01 10*3/uL (ref 0.00–0.07)
Basophils Absolute: 0 10*3/uL (ref 0.0–0.1)
Basophils Relative: 1 %
Eosinophils Absolute: 0 10*3/uL (ref 0.0–0.5)
Eosinophils Relative: 1 %
HCT: 32.6 % — ABNORMAL LOW (ref 36.0–46.0)
Hemoglobin: 10.3 g/dL — ABNORMAL LOW (ref 12.0–15.0)
Immature Granulocytes: 0 %
Lymphocytes Relative: 29 %
Lymphs Abs: 1.2 10*3/uL (ref 0.7–4.0)
MCH: 31.3 pg (ref 26.0–34.0)
MCHC: 31.6 g/dL (ref 30.0–36.0)
MCV: 99.1 fL (ref 80.0–100.0)
Monocytes Absolute: 0.4 10*3/uL (ref 0.1–1.0)
Monocytes Relative: 9 %
Neutro Abs: 2.7 10*3/uL (ref 1.7–7.7)
Neutrophils Relative %: 60 %
Platelets: 170 10*3/uL (ref 150–400)
RBC: 3.29 MIL/uL — ABNORMAL LOW (ref 3.87–5.11)
RDW: 17.8 % — ABNORMAL HIGH (ref 11.5–15.5)
WBC: 4.3 10*3/uL (ref 4.0–10.5)
nRBC: 0 % (ref 0.0–0.2)

## 2020-10-21 LAB — COMPREHENSIVE METABOLIC PANEL
ALT: 14 U/L (ref 0–44)
AST: 18 U/L (ref 15–41)
Albumin: 4.2 g/dL (ref 3.5–5.0)
Alkaline Phosphatase: 45 U/L (ref 38–126)
Anion gap: 11 (ref 5–15)
BUN: 13 mg/dL (ref 8–23)
CO2: 26 mmol/L (ref 22–32)
Calcium: 9.2 mg/dL (ref 8.9–10.3)
Chloride: 102 mmol/L (ref 98–111)
Creatinine, Ser: 1.03 mg/dL — ABNORMAL HIGH (ref 0.44–1.00)
GFR, Estimated: 60 mL/min — ABNORMAL LOW (ref >60)
Glucose, Bld: 110 mg/dL — ABNORMAL HIGH (ref 70–99)
Potassium: 3.5 mmol/L (ref 3.5–5.1)
Sodium: 139 mmol/L (ref 135–145)
Total Bilirubin: 0.8 mg/dL (ref 0.3–1.2)
Total Protein: 7.3 g/dL (ref 6.5–8.1)

## 2020-10-21 NOTE — Progress Notes (Signed)
Patient here for follow up. Pt reports she had a one time pain to right mid back.

## 2020-10-21 NOTE — Progress Notes (Signed)
Hematology/Oncology Follow Up Note [facility]  [phone] [phone]  Patient Care Team: Burnard Hawthorne, FNP as PCP - General (Family Medicine) Clent Jacks, RN as Oncology Nurse Navigator   Name of the patient: Linda Schmitt  [id number]  [birth year]   REASON FOR VISIT  follow-up for serous carcinoma  PERTINENT ONCOLOGY HISTORY [name] is a 65 y.o.afemale who has above oncology history reviewed by me today presented for follow up visit for management of malignant pleural effusion status post thoracentesis x2 during her recent admission. Cytology was positive for metastatic carcinoma.  TTF-1 Napsin A, GATA3, CDX2 were negative. Positive for PAX 8, which is most often expressing tumors of gynecological and renal origin. PET scan was independently reviewed by me and discussed with patient.  I also discussed with radiology. Exam showed evidence of peritoneal disease within the abdomen, soft tissue infiltrating into the omentum.  Large loculated left pleural effusion with thin FDG avid rind of soft tissue overlying the left lung.  Small right pleural effusion with mild FDG uptake is equivocal for malignant effusion of the right hemithorax. Fluid density structure within the right side of the pelvis abutting the right-sided of uterus is noted.  This is indeterminate.  Further investigation with pelvic sonogram may be helpful.  Pelvic ultrasound was obtained.-Heterogeneous myometrium with poor definition of endometrial complex. Normal left ovary.  No cystic right adnexal lesion is identified.  No a probable small normal-appearing right ovary is identified.  Suspect that the low-attenuation presumed cystic structure seen in the right adnexa on the prior image corresponds to a small amount of nonspecific free fluid in the right adnexa. I discussed with gynecology oncology Dr.Secord.  Given that cytology from left pleural effusion is  positive for metastatic carcinoma, tumor cells are positive for PAX 8 which indicates GYN origin.  Given that patient has moderate to large amount of pleural effusion, heavy tumor burden, recommend to start chemotherapy as soon as possible with carboplatin AUC 5-6 and Taxol 175 mg/m every 3 weeks for 3 cycles.   #Pre-existing neuropathy of left hand, she takes gabapentin. # seen by Dr. Theora Gianotti on 04/30/2020 and she recommends to proceed with the fourth cycle of chemotherapy and patient will be seen by Dr. Theora Gianotti again on 05/27/2020 for debulking surgery plan on 06/05/2020 with cardiothoracic surgery for thoracoscopy possible thoracic tumor debulking.  Patient was recommended to have to repeat CT of the chest after her chemotherapy.  Oncologic chemotherapy treatments 03/04/2020- 9/28/201 4 cycles of neoadjuvant carbo and Taxol  06/05/2020, status post Total hysterectomy and bilateral salpingo-oophorectomy, Omentectomy, and Optimal (R<1) interval tumor debulking. A. Peritoneal nodule, excisional biopsy: Fibroadipose tissue with calcifications, negative for tumor. B. Uterus, bilateral ovaries and fallopian tubes, hysterectomy and bilateral salpingo-oophorectomy: Cervix: Squamous metaplasia. Endometrium: Inactive endometrium. Myometrium: Adenomyosis. Serosa: High grade serous adenocarcinoma. Right ovary: High grade serous adenocarcinoma, involving the ovarian surface and parenchyma. Right fallopian tube: No specific pathologic change. Left ovary: High grade serous adenocarcinoma, involving the ovarian surface and parenchyma. Left fallopian tube: High grade serous adenocarcinoma, involving tubal fimbria. Serous tubal intraepithelial carcinoma. Immunohistochemistry was performed on block B14 at [facility] to characterize the pathologic process and demonstrates the following immunophenotype in the cells of interest:POSITIVE:  p53, p16 (strong, patchy), Ki67 (elevated) This staining profile supports  the above diagnosis. C. Peritoneal nodule, left pericolic gutter, excisional biopsy: High grade serous adenocarcinoma. D. Cul-de-sac, excisional biopsy: High grade serous adenocarcinoma. E. Omentum, excision: High grade serous adenocarcinoma (  up to 3.0 cm).  TUMOR  Tumor Site:  Left fallopian tube  Histologic Type:  Serous carcinoma  Histologic Grade:  High grade  Tumor Size:  Greatest Dimension (Centimeters): 0.2 cm  Ovarian Surface Involvement:  Present   Laterality:  Bilateral  Fallopian Tube Surface Involvement:  Present   Laterality:  Left  Implants:  Present (sites): left peritoneum, cul-de-sac, omentum  Other Tissue / Organ Involvement:  Right ovary  Other Tissue / Organ Involvement:  Left ovary  Other Tissue / Organ Involvement:  Left fallopian tube  Other Tissue / Organ Involvement:  Pelvic peritoneum  Other Tissue / Organ Involvement:  Omentum  Largest Extrapelvic Peritoneal Focus:  Macroscopic (greater than 2 cm)  Peritoneal / Ascitic Fluid:  Not submitted / unknown  Pleural Fluid:  Not submitted / unknown  Treatment Effect:  Moderate response identified (CRS 2)   LYMPH NODES  Regional Lymph Nodes:  No lymph nodes submitted or found   PATHOLOGIC STAGE CLASSIFICATION (pTNM, AJCC 8th Edition)  TNM Descriptors:  y (post-treatment)  Primary Tumor (pT):  pT3c  Regional Lymph Nodes (pN):  pNX FIGO Stage:  IIIC   + pleural effusion so STAGE IV No germline BRCA mutation, -Homologous recombination deficiency positive.  Myriad Genomic instability score:  positive, somatic BRCA1 positive  07/01/20- 08/12/2020 3 cycles of adjuvant chemotherapy with carboplatin AUC 5, Taxol 175 mg/m. 09/02/2020, started on olaparib 300 mg twice daily.  INTERVAL HISTORY 65 year old female presents for follow-up of stage IV ovarian cancer,  somatic BRCA1 positive and HRD positive.  Patient has been on olaparib since January  2022.   Overall she tolerates well. Today she reports one episode of right mid back pain.  Resolved. Pre-existing neuropathy, worse worse when she saw me last visit.  So I added nortriptyline.  Patient has not started yet.  She feels that her symptoms are slightly better compared to last visit.   Review of Systems  Constitutional: Negative for appetite change, chills, fatigue and fever.  HENT:   Negative for hearing loss and voice change.   Eyes: Negative for eye problems.  Respiratory: Negative for chest tightness and cough.   Cardiovascular: Negative for chest pain.  Gastrointestinal: Negative for abdominal distention, abdominal pain and blood in stool.  Endocrine: Negative for hot flashes.  Genitourinary: Negative for difficulty urinating and frequency.   Musculoskeletal: Positive for back pain. Negative for arthralgias.  Skin: Negative for itching and rash.  Neurological: Positive for numbness. Negative for extremity weakness.       Pre-existing neuropathy, worse.   Hematological: Negative for adenopathy.  Psychiatric/Behavioral: Negative for confusion.      No Known Allergies   Past Medical History:  Diagnosis Date  . Family history of breast cancer   . Genetic testing 04/09/2020   No pathogenic variants identified on the Myriad MyRisk (germline) panel. The report date is 04/07/2020.   The [facility] gene panel offered by [employer] includes sequencing and deletion/duplication testing of the following 35 genes: APC, ATM, AXIN2, BARD1, BMPR1A, BRCA1, BRCA2, BRIP1, CHD1, CDK4, CDKN2A, CHEK2, EPCAM (large rearrangement only), HOXB13, GALNT12, MLH1, MSH2, MSH3, MSH6  . HLD (hyperlipidemia)      Past Surgical History:  Procedure Laterality Date  . CARPAL TUNNEL RELEASE Left   . ROBOTIC ASSISTED TOTAL HYSTERECTOMY WITH BILATERAL SALPINGO OOPHERECTOMY  06/05/2020   with omentectomy tumor debulking via mini-laparotomy/hand assisted port  . SHOULDER ARTHROSCOPY WITH  SUBACROMIAL DECOMPRESSION AND OPEN ROTATOR C Right 02/01/2020   Procedure: RIGHT SHOULDER  ARTHROSCOPIC SUBSCAPULARIS REPAIR, ROTATOR CUFF REPAIR, SUABCROMIAL DECOMPRESSION, AND BICEPS TENODESIS.;  Surgeon: Leim Fabry, MD;  Location: [facility] ORS;  Service: Orthopedics;  Laterality: Right;  . TUBAL LIGATION      Social History   Socioeconomic History  . Marital status: Single    Spouse name: Not on file  . Number of children: Not on file  . Years of education: Not on file  . Highest education level: Not on file  Occupational History  . Not on file  Tobacco Use  . Smoking status: Former Smoker    Packs/day: 0.75    Years: 40.00    Pack years: 30.00    Types: Cigarettes    Quit date: 02/06/2018    Years since quitting: 2.7  . Smokeless tobacco: Never Used  . Tobacco comment: quit 02/2018  Vaping Use  . Vaping Use: Never used  Substance and Sexual Activity  . Alcohol use: Yes    Comment: 2-3 glasses of wine per week  . Drug use: No  . Sexual activity: Not on file  Other Topics Concern  . Not on file  Social History Narrative   Lives in [place] alone. Work - [employer]   Diet: Regular   Exercise: walking   Social Determinants of Radio broadcast assistant Strain: Not on file  Food Insecurity: No Landscape architect  . Worried About Charity fundraiser in the Last Year: Never true  . Ran Out of Food in the Last Year: Never true  Transportation Needs: No Transportation Needs  . Lack of Transportation (Medical): No  . Lack of Transportation (Non-Medical): No  Physical Activity: Not on file  Stress: No Stress Concern Present  . Feeling of Stress : Not at all  Social Connections: Not on file  Intimate Partner Violence: Not At Risk  . Fear of Current or Ex-Partner: No  . Emotionally Abused: No  . Physically Abused: No  . Sexually Abused: No    Family History  Problem Relation Age of Onset  . Cancer Mother        lymphoma  . Stroke Sister   . Cancer Brother        lung   . Cancer Sister        lung  . Lung cancer Brother   . Bone cancer Sister   . Breast cancer Cousin 29       maternal  . Cancer Maternal Aunt        unk types     Current Outpatient Medications:  .  acetaminophen (TYLENOL) 500 MG tablet, Take 2 tablets (1,000 mg total) by mouth every 8 (eight) hours., Disp: 90 tablet, Rfl: 2 .  cholecalciferol (VITAMIN D3) 10 MCG (400 UNIT) TABS tablet, Take 800 Units by mouth daily., Disp: , Rfl:  .  cyanocobalamin 1000 MCG tablet, Take 400 mcg by mouth daily., Disp: , Rfl:  .  gabapentin (NEURONTIN) 300 MG capsule, Take 1 capsule (300 mg total) by mouth 2 (two) times daily. (Patient taking differently: Take 300 mg by mouth 2 (two) times daily. 1 QAM 2 QHS, 1 tab mid day prn), Disp: 60 capsule, Rfl: 2 .  olaparib (LYNPARZA) 150 MG tablet, Take 2 tablets (300 mg total) by mouth 2 (two) times daily. Swallow whole. May take with food to decrease nausea and vomiting., Disp: 120 tablet, Rfl: 1 .  potassium chloride SA (KLOR-CON) 20 MEQ tablet, Take 1 tablet (20 mEq total) by mouth daily., Disp: 2 tablet, Rfl: 0 .  pravastatin (PRAVACHOL) 40 MG tablet, TAKE 1 TABLET(40 MG) BY MOUTH DAILY, Disp: 90 tablet, Rfl: 0 .  nortriptyline (PAMELOR) 10 MG capsule, Take 1 capsule (10 mg total) by mouth at bedtime. (Patient not taking: Reported on 10/21/2020), Disp: 30 capsule, Rfl: 0  Physical exam:  Vitals:   10/21/20 1026  BP: 127/68  Pulse: 73  Resp: 18  Temp: 98 F (36.7 C)  Weight: 237 lb 4.8 oz (107.6 kg)   Physical Exam Constitutional:      General: She is not in acute distress. HENT:     Head: Normocephalic and atraumatic.  Eyes:     General: No scleral icterus. Cardiovascular:     Rate and Rhythm: Normal rate and regular rhythm.     Heart sounds: Normal heart sounds.  Pulmonary:     Effort: Pulmonary effort is normal. No respiratory distress.     Breath sounds: No wheezing.     Comments: Diminished breathing sounds.  left lower 1/3  Abdominal:      General: Bowel sounds are normal. There is no distension.     Palpations: Abdomen is soft.  Musculoskeletal:        General: No deformity. Normal range of motion.     Cervical back: Normal range of motion and neck supple.  Skin:    General: Skin is warm and dry.     Findings: No erythema or rash.  Neurological:     Mental Status: She is alert and oriented to person, place, and time. Mental status is at baseline.     Cranial Nerves: No cranial nerve deficit.     Coordination: Coordination normal.  Psychiatric:        Mood and Affect: Mood normal.         CMP Latest Ref Rng & Units 10/21/2020  Glucose 70 - 99 mg/dL 110(H)  BUN 8 - 23 mg/dL 13  Creatinine 0.44 - 1.00 mg/dL 1.03(H)  Sodium 135 - 145 mmol/L 139  Potassium 3.5 - 5.1 mmol/L 3.5  Chloride 98 - 111 mmol/L 102  CO2 22 - 32 mmol/L 26  Calcium 8.9 - 10.3 mg/dL 9.2  Total Protein 6.5 - 8.1 g/dL 7.3  Total Bilirubin 0.3 - 1.2 mg/dL 0.8  Alkaline Phos 38 - 126 U/L 45  AST 15 - 41 U/L 18  ALT 0 - 44 U/L 14   CBC Latest Ref Rng & Units 10/21/2020  WBC 4.0 - 10.5 K/uL 4.3  Hemoglobin 12.0 - 15.0 g/dL 10.3(L)  Hematocrit 36.0 - 46.0 % 32.6(L)  Platelets 150 - 400 K/uL 170    RADIOGRAPHIC STUDIES: I have personally reviewed the radiological images as listed and agreed with the findings in the report. No results found.   Assessment and plan Patient is a 65 y.o. female presents for follow-up for FIGO IIIC ovarian cancer, ( somatic BRCA1 positive, HRD positive). 1. Carcinoma of fallopian tube, unspecified laterality ([place])   2. Encounter for antineoplastic chemotherapy   3. Neuropathy   4. Normocytic anemia   5. Malignant pleural effusion    #FIGO IVA Fallopian tube serous carcinoma- + pleural effusion cytology S/p 4 cycles of neoadjuvant chemotherapy  Carboplatin /Taxol  S/p total hysterectomy with salpingo-oophorectomy and omentectomy. Optimal (R<1) interval tumor debulking. S/p 3 cycles of adjuvant carbopltain   Taxol  # HRD, patient is clinically doing well.  CA-125 is being followed.  Continue olaparib 366m BID maintenance.   #Worsening chemotherapy induced neuropathy, continue gabapentin current regimen, 600 mg twice daily plus  another 300 mg midday as needed. Recommend patient to try nortriptyline 83m QHS. Refer to acupuncture clinic. .Marland Kitchen#Anemia, likely secondary to recent surgery.  Hemoglobin is 10.3 stable.  # Malignant pleural effusion, symptom is stable.   We spent sufficient time to discuss many aspect of care, questions were answered to patient's satisfaction.  Follow-up in 4 weeks   ZEarlie Server MD, PhD Hematology Oncology CWellstar Spalding Regional Hospitalat ANew [facility] LLC Dba New Vision Cataract CenterPager- [phone]/15/2022

## 2020-10-21 NOTE — Progress Notes (Signed)
Patient requested referral to acupuncture for chemotherapy induced peripheral neuropathy per Janeann Merl RN.  Approval sent to [place].  Patient notified.

## 2020-10-22 LAB — CA 125: Cancer Antigen (CA) 125: 6.1 U/mL (ref 0.0–38.1)

## 2020-10-29 ENCOUNTER — Inpatient Hospital Stay (HOSPITAL_BASED_OUTPATIENT_CLINIC_OR_DEPARTMENT_OTHER): Payer: Medicare HMO | Admitting: Nurse Practitioner

## 2020-10-29 VITALS — BP 152/66 | HR 81 | Temp 98.7°F | Resp 20 | Wt 234.8 lb

## 2020-10-29 DIAGNOSIS — G629 Polyneuropathy, unspecified: Secondary | ICD-10-CM | POA: Diagnosis not present

## 2020-10-29 DIAGNOSIS — Z90710 Acquired absence of both cervix and uterus: Secondary | ICD-10-CM | POA: Diagnosis not present

## 2020-10-29 DIAGNOSIS — C579 Malignant neoplasm of female genital organ, unspecified: Secondary | ICD-10-CM | POA: Diagnosis not present

## 2020-10-29 DIAGNOSIS — R109 Unspecified abdominal pain: Secondary | ICD-10-CM | POA: Diagnosis not present

## 2020-10-29 DIAGNOSIS — K5900 Constipation, unspecified: Secondary | ICD-10-CM | POA: Diagnosis not present

## 2020-10-29 DIAGNOSIS — Z79899 Other long term (current) drug therapy: Secondary | ICD-10-CM | POA: Diagnosis not present

## 2020-10-29 DIAGNOSIS — C5700 Malignant neoplasm of unspecified fallopian tube: Secondary | ICD-10-CM

## 2020-10-29 DIAGNOSIS — J910 Malignant pleural effusion: Secondary | ICD-10-CM | POA: Diagnosis not present

## 2020-10-29 DIAGNOSIS — D649 Anemia, unspecified: Secondary | ICD-10-CM | POA: Diagnosis not present

## 2020-10-29 DIAGNOSIS — R971 Elevated cancer antigen 125 [CA 125]: Secondary | ICD-10-CM | POA: Diagnosis not present

## 2020-10-29 DIAGNOSIS — E785 Hyperlipidemia, unspecified: Secondary | ICD-10-CM | POA: Diagnosis not present

## 2020-10-29 NOTE — Progress Notes (Signed)
Gynecologic Oncology Interval Visit   Referring Provider: Dr. Tasia Catchings  Chief Complaint: Metastatic high grade serous carcinoma of Mullerian origin s/p 3 cycles NACT  Subjective:  Linda Schmitt is a 65 y.o. G1P1 female, initially seen in consultation from Dr. 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   She complains of suprapubic, pelvic pressure that has started int he last 2 weeks. Feels like period cramps. No vaginal discharge, bleeding. Feels well otherwise. Tolerating olaparib well without significant side effects.    Gynecologic Oncology History:  Linda Schmitt is a 65 y.o. female, initially seen in consultation from Dr. 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 Her history is as follows:  She presented with malignant pleural effusion two weeks after elective right shoulder surgery. She underwent thoracentesis on 7/12 and 7/13 with 1.8L and 1.2L removed respectively. Cytology was positive for PAX 8. PET scan showed evidence of peritoneal disease within the abdomen, soft tissue infiltrating into the omentum, large loculated left pleural effusion with thin FDG avid rind of soft tissue overlying the left lung. Small right pleural effusion with mild FDG uptake is equivocal for malignant effusion of the right hemithorax. Fluid density structure within right side of pelvis abutting the right sided uterus noted though indeterminate. Pelvic u/s obtained showed heterogeneous myometrium with poor definition of endometrial complex. Normal left ovary.  No cystic right adnexal lesion is identified.  No a probable small normal-appearing right ovary is identified.  Suspect that the low-attenuation presumed cystic structure seen in the right adnexa on the  prior image corresponds to a small amount of nonspecific free fluid in the right adnexa.  Biopsy of omental mass on 03/05/20  DIAGNOSIS:  A. OMENTAL MASS; CT-GUIDED BIOPSY:  - HIGH-GRADE SEROUS CARCINOMA.   Given that cytology from left pleural effusion is positive for metastatic carcinoma, tumor cells are positive for PAX 8 which indicates GYN origin.  Given that patient has moderate to large amount of pleural effusion, heavy tumor burden, recommend to start chemotherapy as soon as possible with carboplatin AUC 5-6 and paclitaxel 175 mg/m every 3 weeks for 3 cycles.  Patient will establish care with gynecology oncology for evaluation of participating in clinical trials after neoadjuvant chemo/surgery. CA 125 was 53.6 at time of diagnosis.   Treatment Summary: 03/04/20 Cycle 1 carboplatin-paclitaxel CA 125 53.6 03/25/20 Cycle 2 carboplatin-paclitaxel CA 125 39.9 04/15/20  Cycle 3 carboplatin-paclitaxel CA 125 10.5  04/25/20 CT C/A/P revealed moderate left malignant pleural effusion with some pleural thickening and enhancement which was decreased in size compared to prior exam. Omental caking apparent concerning for intraperitoneal metastatic disease. No definite primary malignancy identified. Stable left adrenal nodule. Aortic atherosclerosis.   05/06/2020 Cycle 4 carboplatin-paclitaxel CA125 5.8  On 06/05/2020 she underwent Exam under anesthesia, Diagnostic laparoscopy, Robot-assisted total laparoscopic hysterectomy, Bilateral salpingo-oophorectomy, Omentectomy via mini-laparotomy/hand assisted port, and Optimal (R<1) interval tumor debulking. Pathology c/w tubal origin.   Pathology: High grade serous adenocarcinoma involving bilateral ovaries and fallopian tubes; peritoneal nodules, cul-de-sac and omentum (primary site left tube). p53, p16 (strong, patchy), Ki67 (elevated)  11/22/2021Cycle #5 paclitaxel and carboplatin CA125 4.6 07/22/2020 Cycle #6 paclitaxel and carboplatin  CA125 4.8  Vitamin  B12 = 681  GENETIC TEST RESULTS:   Germline Testing: 04/07/20- negative. No clinically significant mutation identified. Breast Cancer Riskscore  remaining life time risk- 7%.   Somatic Testing: Genetic testing reported out on 04/08/2020 through the [facility] [employer] HRD. No pathogenic variants identified on the [facility] (germline) panel. Pathogenic variant identified through [employer] (HRD testing) in BRCA1 called [id number], HRD positive.   The [facility] gene panel offered by [employer] includes sequencing and deletion/duplication testing of the following 35 genes: APC, ATM, AXIN2, BARD1, BMPR1A, BRCA1, BRCA2, BRIP1, CHD1, CDK4, CDKN2A, CHEK2, EPCAM (large rearrangement only), HOXB13, GALNT12, MLH1, MSH2, MSH3, MSH6, MUTYH, NBN, NTHL1, PALB2, PMS2, PTEN, RAD51C, RAD51D, RNF43, RPS20, SMAD4, STK11, and TP53. Sequencing was performed for select regions of POLE and POLD1, and large rearrangement analysis was performed for select regions of GREM1.    Problem List: Patient Active Problem List   Diagnosis Date Noted  . Normocytic anemia 06/30/2020  . Carcinoma of fallopian tube ([place]) 06/25/2020  . Endometrial cancer ([place]) 06/05/2020  . BRCA1 gene mutation positive 04/11/2020  . Genetic testing 04/09/2020  . Serous carcinoma of female pelvis ([place]) 03/20/2020  . Family history of breast cancer   . Encounter for antineoplastic chemotherapy 03/04/2020  . Neuropathy 03/04/2020  . Mass of omentum 02/28/2020  . Goals of care, counseling/discussion 02/26/2020  . Metastatic carcinoma ([place]) 02/26/2020  . Status post thoracentesis   . Malignant pleural effusion   . Pleural effusion 02/17/2020  . Mass of arm, right 01/22/2020  . Elevated blood pressure reading 12/28/2019  . Hand tingling 12/28/2019  . Acute pain of right shoulder 12/01/2018  . Adrenal adenoma, left 11/11/2017  . Aortic atherosclerosis ([place]) 09/23/2017  . Prediabetes 07/09/2017  . Ganglion of right wrist  07/06/2017  . Hyperlipidemia 10/11/2016  . Former smoker 09/21/2016  . Depression, recurrent ([place]) 09/14/2016  . Headache 09/14/2016  . Routine general medical examination at a health care facility 01/14/2015  . Screening for breast cancer 11/12/2013    Past Medical History: Past Medical History:  Diagnosis Date  . Family history of breast cancer   . Genetic testing 04/09/2020   No pathogenic variants identified on the [employer] [facility] (germline) panel. The report date is 04/07/2020.   The [facility] gene panel offered by [employer] includes sequencing and deletion/duplication testing of the following 35 genes: APC, ATM, AXIN2, BARD1, BMPR1A, BRCA1, BRCA2, BRIP1, CHD1, CDK4, CDKN2A, CHEK2, EPCAM (large rearrangement only), HOXB13, GALNT12, MLH1, MSH2, MSH3, MSH6  . HLD (hyperlipidemia)     Past Surgical History: Past Surgical History:  Procedure Laterality Date  . CARPAL TUNNEL RELEASE Left   . ROBOTIC ASSISTED TOTAL HYSTERECTOMY WITH BILATERAL SALPINGO OOPHERECTOMY  06/05/2020   with omentectomy tumor debulking via mini-laparotomy/hand assisted port  . SHOULDER ARTHROSCOPY WITH SUBACROMIAL DECOMPRESSION AND OPEN ROTATOR C Right 02/01/2020   Procedure: RIGHT SHOULDER ARTHROSCOPIC SUBSCAPULARIS REPAIR, ROTATOR CUFF REPAIR, SUABCROMIAL DECOMPRESSION, AND BICEPS TENODESIS.;  Surgeon: Leim Fabry, MD;  Location: [facility] ORS;  Service: Orthopedics;  Laterality: Right;  . TUBAL LIGATION      Past Gynecologic History: as per HPI   OB History:  OB History  Gravida Para Term Preterm AB Living  1 1       1   SAB IAB Ectopic Multiple Live Births               # Outcome Date GA Lbr Len/2nd Weight Sex Delivery Anes PTL Lv  1 Para             Family History: Family History  Problem Relation Age of Onset  . Cancer Mother  lymphoma  . Stroke Sister   . Cancer Brother        lung  . Cancer Sister        lung  . Lung cancer Brother   . Bone cancer Sister   . Breast  cancer Cousin 28       maternal  . Cancer Maternal Aunt        unk types  Family history significant for lung cancer, lymphoma, and breast cancer.   Social History: Social History   Socioeconomic History  . Marital status: Single    Spouse name: Not on file  . Number of children: Not on file  . Years of education: Not on file  . Highest education level: Not on file  Occupational History  . Not on file  Tobacco Use  . Smoking status: Former Smoker    Packs/day: 0.75    Years: 40.00    Pack years: 30.00    Types: Cigarettes    Quit date: 02/06/2018    Years since quitting: 2.7  . Smokeless tobacco: Never Used  . Tobacco comment: quit 02/2018  Vaping Use  . Vaping Use: Never used  Substance and Sexual Activity  . Alcohol use: Yes    Comment: 2-3 glasses of wine per week  . Drug use: No  . Sexual activity: Not on file  Other Topics Concern  . Not on file  Social History Narrative   Lives in [place] alone. Work - [employer]   Diet: Regular   Exercise: walking   Social Determinants of Radio broadcast assistant Strain: Not on file  Food Insecurity: No Landscape architect  . Worried About Charity fundraiser in the Last Year: Never true  . Ran Out of Food in the Last Year: Never true  Transportation Needs: No Transportation Needs  . Lack of Transportation (Medical): No  . Lack of Transportation (Non-Medical): No  Physical Activity: Not on file  Stress: No Stress Concern Present  . Feeling of Stress : Not at all  Social Connections: Not on file  Intimate Partner Violence: Not At Risk  . Fear of Current or Ex-Partner: No  . Emotionally Abused: No  . Physically Abused: No  . Sexually Abused: No   Immunization History  Administered Date(s) Administered  . PFIZER(Purple Top)SARS-COV-2 Vaccination 05/08/2020, 05/29/2020  . Tdap 12/24/2011     Allergies: No Known Allergies  Current Medications: Current Outpatient Medications  Medication Sig Dispense Refill  .  acetaminophen (TYLENOL) 500 MG tablet Take 2 tablets (1,000 mg total) by mouth every 8 (eight) hours. 90 tablet 2  . cholecalciferol (VITAMIN D3) 10 MCG (400 UNIT) TABS tablet Take 800 Units by mouth daily.    . cyanocobalamin 1000 MCG tablet Take 400 mcg by mouth daily.    Marland Kitchen gabapentin (NEURONTIN) 300 MG capsule Take 1 capsule (300 mg total) by mouth 2 (two) times daily. (Patient taking differently: Take 300 mg by mouth 2 (two) times daily. 1 QAM 2 QHS, 1 tab mid day prn) 60 capsule 2  . nortriptyline (PAMELOR) 10 MG capsule Take 1 capsule (10 mg total) by mouth at bedtime. (Patient not taking: Reported on 10/21/2020) 30 capsule 0  . olaparib (LYNPARZA) 150 MG tablet Take 2 tablets (300 mg total) by mouth 2 (two) times daily. Swallow whole. May take with food to decrease nausea and vomiting. 120 tablet 1  . potassium chloride SA (KLOR-CON) 20 MEQ tablet Take 1 tablet (20 mEq total) by mouth daily. 2  tablet 0  . pravastatin (PRAVACHOL) 40 MG tablet TAKE 1 TABLET(40 MG) BY MOUTH DAILY 90 tablet 0   No current facility-administered medications for this visit.   Immunization History  Administered Date(s) Administered  . PFIZER(Purple Top)SARS-COV-2 Vaccination 05/08/2020, 05/29/2020  . Tdap 12/24/2011    Review of Systems General:  no complaints Skin: no complaints Eyes: no complaints HEENT: no complaints Breasts: no complaints Pulmonary: no complaints Cardiac: no complaints Gastrointestinal: no complaints Genitourinary/Sexual: no complaints Ob/Gyn: no complaints Musculoskeletal: no complaints Hematology: no complaints Neurologic/Psych: no complaints    Objective:  Physical Examination:  BP (!) 152/66   Pulse 81   Temp 98.7 F (37.1 C)   Resp 20   Wt 234 lb 12.8 oz (106.5 kg)   SpO2 100%   BMI 41.59 kg/m     ECOG Performance Status: 1 - Symptomatic but completely ambulatory  GENERAL: Patient is a well appearing female in no acute distress HEENT:  Sclera clear.  Anicteric NODES:  Negative axillary, supraclavicular, inguinal lymph node survery LUNGS:  Clear to auscultation bilaterally.   HEART:  Regular rate and rhythm.  ABDOMEN:  Soft, nontender.  No hernias, incisions well healed. No masses or ascites EXTREMITIES:  No peripheral edema. Atraumatic. No cyanosis SKIN:  Clear with no obvious rashes or skin changes.  NEURO:  Nonfocal. Well oriented.  Appropriate affect.  Pelvic: chaperoned by CMA EGBUS: no lesions, Cervix: absent, Vagina: no lesions, no discharge or bleeding. Cuff intact. No palpable nodularity. Uterus: absent, BME: no palpable masses, smooth. Rectovaginal: confirmatory  Lab Review n/a  Radiologic Imaging: No imaging on site today     Assessment:  Linda Schmitt is a 65 y.o. female diagnosed with metastatic high grade serous Fallopian tube cancer (BRCA1 somatic mutation; HRD) s/p NACT with paclitaxel/carboplatin s/p 4 cycles and MIS optimal interval debulking on 06/05/2020, excellent postoperative recovery and resumption of chemotherapy, completed 3 cycles of adjuvant carboplatin-taxol chemotherapy on 08/12/20, currently on maintenance olaparib since January 2022. NED on exam. Pelvic pressure/pain of unclear etiology.   Pleural effusion on previous exams, asymptomatic  Constipation, improved  Medical co-morbidities complicating care: Body mass index is 41.59 kg/m. Plan:   Problem List Items Addressed This Visit      Genitourinary   Carcinoma of fallopian tube ([place]) - Primary     Tolerating maintenance carb inhibitor well without significant side effects.  Suprapubic pressure/pain of unclear etiology.  Will get ct chest/abdomen/pelvis to evaluate further.  Continue stool softeners as needed for constipation.   Follow up with Dr. Tasia Catchings as scheduled in 1 month for re-evaluation in setting of pump inhibitor.  Follow-up with GYN oncology in 3 months.   Beckey Rutter, DNP, AGNP-C [facility] [phone] (clinic)

## 2020-11-03 ENCOUNTER — Other Ambulatory Visit: Payer: Self-pay | Admitting: Pharmacist

## 2020-11-03 DIAGNOSIS — C5700 Malignant neoplasm of unspecified fallopian tube: Secondary | ICD-10-CM

## 2020-11-03 MED ORDER — OLAPARIB 150 MG PO TABS: 300.0000 mg | ORAL_TABLET | Freq: Two times a day (BID) | ORAL | 2 refills | Status: DC

## 2020-11-05 ENCOUNTER — Ambulatory Visit
Admission: RE | Admit: 2020-11-05 | Discharge: 2020-11-05 | Disposition: A | Discharge status: Home / Self Care | Payer: Medicare HMO | Source: Ambulatory Visit | Attending: Family | Admitting: Family

## 2020-11-05 ENCOUNTER — Other Ambulatory Visit: Payer: Self-pay

## 2020-11-05 DIAGNOSIS — Z1231 Encounter for screening mammogram for malignant neoplasm of breast: Secondary | ICD-10-CM | POA: Insufficient documentation

## 2020-11-10 ENCOUNTER — Other Ambulatory Visit: Payer: Self-pay | Admitting: Oncology

## 2020-11-11 DIAGNOSIS — M25512 Pain in left shoulder: Secondary | ICD-10-CM | POA: Diagnosis not present

## 2020-11-12 ENCOUNTER — Encounter: Payer: Self-pay | Admitting: Family

## 2020-11-12 ENCOUNTER — Ambulatory Visit (INDEPENDENT_AMBULATORY_CARE_PROVIDER_SITE_OTHER): Payer: Medicare HMO | Admitting: Family

## 2020-11-12 ENCOUNTER — Other Ambulatory Visit: Payer: Self-pay

## 2020-11-12 ENCOUNTER — Encounter: Payer: Self-pay | Admitting: *Deleted

## 2020-11-12 VITALS — BP 126/78 | HR 72 | Temp 98.3°F | Ht 63.0 in | Wt 234.2 lb

## 2020-11-12 DIAGNOSIS — G629 Polyneuropathy, unspecified: Secondary | ICD-10-CM | POA: Diagnosis not present

## 2020-11-12 DIAGNOSIS — D649 Anemia, unspecified: Secondary | ICD-10-CM

## 2020-11-12 DIAGNOSIS — D3502 Benign neoplasm of left adrenal gland: Secondary | ICD-10-CM

## 2020-11-12 DIAGNOSIS — I700 Atherosclerosis of aorta: Secondary | ICD-10-CM

## 2020-11-12 LAB — B12 AND FOLATE PANEL
Folate: 19.5 ng/mL (ref >5.9)
Vitamin B-12: 601 pg/mL (ref 211–911)

## 2020-11-12 NOTE — Assessment & Plan Note (Signed)
Controlled. Continue gabapentin 1 QAM 2 QHS, 1 tab mid day prn, nortriptyline 10mg .

## 2020-11-12 NOTE — Progress Notes (Signed)
Subjective:    Patient ID: NAYDA RIESEN, female    DOB: 1955-09-20, 65 y.o.   MRN: [id number]  CC: JYASIA MARKOFF is a 64 y.o. female who presents today for follow up.   HPI: Feels well today No complaints  Atherosclerosis- compliant pravastatin 78m  Neuropathy- compliant with gabapentin 1 QAM 2 QHS, 1 tab mid day prn  No depression. compliant with nortriptyline. She takes this neuropathy    normocytic Anemia- low b12 , compliant with b12 100056m. Ferritin 213. Due colonoscopy    Following with YuTasia Catchingsor carcinoma of fallopian tube. Pending ct chest and abdomen pelvis   Mammogram UTD  Ct chest continues to show stable 1639meft adrenal nodule    HISTORY:  Past Medical History:  Diagnosis Date  . Family history of breast cancer   . Genetic testing 04/09/2020   No pathogenic variants identified on the [facility] My[facility] (germline) panel. The report date is 04/07/2020.   The [facility] panel offered by [employer] sequencing and deletion/duplication testing of the following 35 genes: APC, ATM, AXIN2, BARD1, BMPR1A, BRCA1, BRCA2, BRIP1, CHD1, CDK4, CDKN2A, CHEK2, EPCAM (large rearrangement only), HOXB13, GALNT12, MLH1, MSH2, MSH3, MSH6  . HLD (hyperlipidemia)    Past Surgical History:  Procedure Laterality Date  . CARPAL TUNNEL RELEASE Left   . ROBOTIC ASSISTED TOTAL HYSTERECTOMY WITH BILATERAL SALPINGO OOPHERECTOMY  06/05/2020   with omentectomy tumor debulking via mini-laparotomy/hand assisted port  . SHOULDER ARTHROSCOPY WITH SUBACROMIAL DECOMPRESSION AND OPEN ROTATOR C Right 02/01/2020   Procedure: RIGHT SHOULDER ARTHROSCOPIC SUBSCAPULARIS REPAIR, ROTATOR CUFF REPAIR, SUABCROMIAL DECOMPRESSION, AND BICEPS TENODESIS.;  Surgeon: PatLeim FabryD;  Location: [facility] ORS;  Service: Orthopedics;  Laterality: Right;  . TUBAL LIGATION     Family History  Problem Relation Age of Onset  . Cancer Mother        lymphoma  . Stroke Sister   . Cancer  Brother        lung  . Cancer Sister        lung  . Lung cancer Brother   . Bone cancer Sister   . Breast cancer Cousin 45 2    maternal  . Cancer Maternal Aunt        unk types    Allergies: Patient has no known allergies. Current Outpatient Medications on File Prior to Visit  Medication Sig Dispense Refill  . acetaminophen (TYLENOL) 500 MG tablet Take 2 tablets (1,000 mg total) by mouth every 8 (eight) hours. 90 tablet 2  . cholecalciferol (VITAMIN D3) 10 MCG (400 UNIT) TABS tablet Take 800 Units by mouth daily.    . cyanocobalamin 1000 MCG tablet Take 400 mcg by mouth daily.    . gMarland Kitchenbapentin (NEURONTIN) 300 MG capsule Take 1 capsule (300 mg total) by mouth 2 (two) times daily. (Patient taking differently: Take 300 mg by mouth 2 (two) times daily. 1 QAM 2 QHS, 1 tab mid day prn) 60 capsule 2  . meloxicam (MOBIC) 15 MG tablet Take 1 tablet by mouth daily.    . nortriptyline (PAMELOR) 10 MG capsule TAKE 1 CAPSULE BY MOUTH AT BEDTIME. 30 capsule 0  . olaparib (LYNPARZA) 150 MG tablet Take 2 tablets (300 mg total) by mouth 2 (two) times daily. Swallow whole. May take with food to decrease nausea and vomiting. 120 tablet 2  . potassium chloride SA (KLOR-CON) 20 MEQ tablet Take 1 tablet (20 mEq total) by mouth daily. 2 tablet 0  . pravastatin (  PRAVACHOL) 40 MG tablet TAKE 1 TABLET(40 MG) BY MOUTH DAILY 90 tablet 0   No current facility-administered medications on file prior to visit.    Social History   Tobacco Use  . Smoking status: Former Smoker    Packs/day: 0.75    Years: 40.00    Pack years: 30.00    Types: Cigarettes    Quit date: 02/06/2018    Years since quitting: 2.7  . Smokeless tobacco: Never Used  . Tobacco comment: quit 02/2018  Vaping Use  . Vaping Use: Never used  Substance Use Topics  . Alcohol use: Yes    Comment: 2-3 glasses of wine per week  . Drug use: No    Review of Systems  Constitutional: Negative for chills and fever.  Respiratory: Negative for  cough.   Cardiovascular: Negative for chest pain and palpitations.  Gastrointestinal: Negative for nausea and vomiting.      Objective:    BP 126/78   Pulse 72   Temp 98.3 F (36.8 C)   Ht _0  (1.6 m)   Wt 234 lb 3.2 oz (106.2 kg)   SpO2 95%   BMI 41.49 kg/m  BP Readings from Last 3 Encounters:  11/12/20 126/78  10/29/20 (!) 152/66  10/21/20 127/68   Wt Readings from Last 3 Encounters:  11/12/20 234 lb 3.2 oz (106.2 kg)  10/29/20 234 lb 12.8 oz (106.5 kg)  10/21/20 237 lb 4.8 oz (107.6 kg)    Physical Exam Vitals reviewed.  Constitutional:      Appearance: She is well-developed.  Eyes:     Conjunctiva/sclera: Conjunctivae normal.  Cardiovascular:     Rate and Rhythm: Normal rate and regular rhythm.     Pulses: Normal pulses.     Heart sounds: Normal heart sounds.  Pulmonary:     Effort: Pulmonary effort is normal.     Breath sounds: Normal breath sounds. No wheezing, rhonchi or rales.  Skin:    General: Skin is warm and dry.  Neurological:     Mental Status: She is alert.  Psychiatric:        Speech: Speech normal.        Behavior: Behavior normal.        Thought Content: Thought content normal.        Assessment & Plan:   Problem List Items Addressed This Visit      Cardiovascular and Mediastinum   Aortic atherosclerosis ([place])    Stable. Continue pravastatin 51m        Endocrine   Adrenal adenoma, left - Primary    Stable 16 mm left adrenal nodule 05/2020. Pending hormone evaluation and referral to endocrine.       Relevant Orders   Ambulatory referral to Endocrinology     Nervous and Auditory   Neuropathy    Controlled. Continue gabapentin 1 QAM 2 QHS, 1 tab mid day prn, nortriptyline 153m        Other   Normocytic anemia    Iron stores normal. Pending b12 studies and colonoscopy.       Relevant Orders   Ambulatory referral to Gastroenterology   Anti-parietal antibody   Intrinsic Factor Antibodies   Methylmalonic acid, serum    B12 and Folate Panel   Homocysteine   Celiac Disease Ab Screen w/Rfx       I am having CaDanny Lawlesscoggins maintain her cholecalciferol, acetaminophen, cyanocobalamin, potassium chloride SA, gabapentin, pravastatin, olaparib, nortriptyline, and meloxicam.   No orders of the defined  types were placed in this encounter.   Return precautions given.   Risks, benefits, and alternatives of the medications and treatment plan prescribed today were discussed, and patient expressed understanding.   Education regarding symptom management and diagnosis given to patient on AVS.  Continue to follow with Burnard Hawthorne, FNP for routine health maintenance.   [place] and I agreed with plan.   [name], FNP

## 2020-11-12 NOTE — Assessment & Plan Note (Signed)
Stable 16 mm left adrenal nodule 05/2020. Pending hormone evaluation and referral to endocrine.

## 2020-11-12 NOTE — Assessment & Plan Note (Signed)
Stable. Continue pravastatin 40mg 

## 2020-11-12 NOTE — Patient Instructions (Signed)
Referral to endocrine and gastroenterology for colonoscopy  Nice to see you!

## 2020-11-12 NOTE — Assessment & Plan Note (Signed)
Iron stores normal. Pending b12 studies and colonoscopy.

## 2020-11-14 ENCOUNTER — Other Ambulatory Visit: Payer: Self-pay

## 2020-11-14 ENCOUNTER — Ambulatory Visit
Admission: RE | Admit: 2020-11-14 | Discharge: 2020-11-14 | Disposition: A | Discharge status: Home / Self Care | Payer: Medicare HMO | Source: Ambulatory Visit | Attending: Nurse Practitioner | Admitting: Nurse Practitioner

## 2020-11-14 DIAGNOSIS — C5700 Malignant neoplasm of unspecified fallopian tube: Secondary | ICD-10-CM | POA: Diagnosis not present

## 2020-11-14 DIAGNOSIS — R109 Unspecified abdominal pain: Secondary | ICD-10-CM | POA: Insufficient documentation

## 2020-11-14 DIAGNOSIS — D7389 Other diseases of spleen: Secondary | ICD-10-CM | POA: Diagnosis not present

## 2020-11-14 DIAGNOSIS — I700 Atherosclerosis of aorta: Secondary | ICD-10-CM | POA: Diagnosis not present

## 2020-11-14 DIAGNOSIS — J929 Pleural plaque without asbestos: Secondary | ICD-10-CM | POA: Diagnosis not present

## 2020-11-14 DIAGNOSIS — Z90710 Acquired absence of both cervix and uterus: Secondary | ICD-10-CM | POA: Insufficient documentation

## 2020-11-14 DIAGNOSIS — R918 Other nonspecific abnormal finding of lung field: Secondary | ICD-10-CM | POA: Diagnosis not present

## 2020-11-14 LAB — CELIAC DISEASE AB SCREEN W/RFX
Antigliadin Abs, IgA: 4 units (ref 0–19)
IgA/Immunoglobulin A, Serum: 296 mg/dL (ref 87–352)
Transglutaminase IgA: <2 U/mL (ref 0–3)

## 2020-11-14 MED ORDER — IOHEXOL 300 MG/ML  SOLN
100.0000 mL | Freq: Once | INTRAMUSCULAR | Status: AC | PRN
  Administered 2020-11-14: 100 mL via INTRAVENOUS

## 2020-11-16 LAB — METHYLMALONIC ACID, SERUM: Methylmalonic Acid, Quant: 65 nmol/L — ABNORMAL LOW (ref 87–318)

## 2020-11-16 LAB — ANTI-PARIETAL ANTIBODY: PARIETAL CELL AB SCREEN: NEGATIVE

## 2020-11-16 LAB — HOMOCYSTEINE: Homocysteine: 8.5 umol/L (ref <10.4)

## 2020-11-16 LAB — INTRINSIC FACTOR ANTIBODIES: Intrinsic Factor: NEGATIVE

## 2020-11-17 ENCOUNTER — Encounter: Payer: Self-pay | Admitting: *Deleted

## 2020-11-17 ENCOUNTER — Telehealth: Payer: Self-pay | Admitting: Family

## 2020-11-17 ENCOUNTER — Other Ambulatory Visit: Payer: Self-pay | Admitting: Family

## 2020-11-17 DIAGNOSIS — D3502 Benign neoplasm of left adrenal gland: Secondary | ICD-10-CM

## 2020-11-17 NOTE — Telephone Encounter (Signed)
Rejection Reason - Other - Per Dr. Gabriel Carina, this patient was evaluated in June 2020 for adrenal adenoma. Evaluation at this time was normal and endocrine follow up was not needed. Dr. Gabriel Carina has reviewed patient's records since consultation and is wanting clarification on what new issues needs to be addressed. Please provide any documentation that may not be available in care everywhere. Dr. Gabriel Carina will then re-review this consult request. Thanks!" Doyle Askew said on Nov 17, 2020 10:39 AM  [facility]

## 2020-11-18 ENCOUNTER — Telehealth: Payer: Self-pay

## 2020-11-18 ENCOUNTER — Inpatient Hospital Stay: Payer: Medicare HMO | Attending: Oncology

## 2020-11-18 ENCOUNTER — Telehealth: Payer: Self-pay | Admitting: Nurse Practitioner

## 2020-11-18 ENCOUNTER — Inpatient Hospital Stay (HOSPITAL_BASED_OUTPATIENT_CLINIC_OR_DEPARTMENT_OTHER): Payer: Medicare HMO | Admitting: Oncology

## 2020-11-18 ENCOUNTER — Encounter: Payer: Self-pay | Admitting: Oncology

## 2020-11-18 VITALS — BP 138/77 | HR 72 | Temp 97.7°F | Resp 16 | Wt 236.0 lb

## 2020-11-18 DIAGNOSIS — D649 Anemia, unspecified: Secondary | ICD-10-CM | POA: Insufficient documentation

## 2020-11-18 DIAGNOSIS — G620 Drug-induced polyneuropathy: Secondary | ICD-10-CM | POA: Insufficient documentation

## 2020-11-18 DIAGNOSIS — C5700 Malignant neoplasm of unspecified fallopian tube: Secondary | ICD-10-CM | POA: Insufficient documentation

## 2020-11-18 DIAGNOSIS — J910 Malignant pleural effusion: Secondary | ICD-10-CM | POA: Insufficient documentation

## 2020-11-18 DIAGNOSIS — Z5111 Encounter for antineoplastic chemotherapy: Secondary | ICD-10-CM | POA: Diagnosis not present

## 2020-11-18 DIAGNOSIS — G629 Polyneuropathy, unspecified: Secondary | ICD-10-CM

## 2020-11-18 DIAGNOSIS — Z87891 Personal history of nicotine dependence: Secondary | ICD-10-CM | POA: Insufficient documentation

## 2020-11-18 DIAGNOSIS — C763 Malignant neoplasm of pelvis: Secondary | ICD-10-CM

## 2020-11-18 DIAGNOSIS — Z79899 Other long term (current) drug therapy: Secondary | ICD-10-CM | POA: Diagnosis not present

## 2020-11-18 LAB — CBC WITH DIFFERENTIAL/PLATELET
Abs Immature Granulocytes: 0.02 10*3/uL (ref 0.00–0.07)
Basophils Absolute: 0 10*3/uL (ref 0.0–0.1)
Basophils Relative: 0 %
Eosinophils Absolute: 0.1 10*3/uL (ref 0.0–0.5)
Eosinophils Relative: 1 %
HCT: 31.7 % — ABNORMAL LOW (ref 36.0–46.0)
Hemoglobin: 10.4 g/dL — ABNORMAL LOW (ref 12.0–15.0)
Immature Granulocytes: 0 %
Lymphocytes Relative: 31 %
Lymphs Abs: 1.4 10*3/uL (ref 0.7–4.0)
MCH: 32.7 pg (ref 26.0–34.0)
MCHC: 32.8 g/dL (ref 30.0–36.0)
MCV: 99.7 fL (ref 80.0–100.0)
Monocytes Absolute: 0.3 10*3/uL (ref 0.1–1.0)
Monocytes Relative: 8 %
Neutro Abs: 2.7 10*3/uL (ref 1.7–7.7)
Neutrophils Relative %: 60 %
Platelets: 155 10*3/uL (ref 150–400)
RBC: 3.18 MIL/uL — ABNORMAL LOW (ref 3.87–5.11)
RDW: 16.3 % — ABNORMAL HIGH (ref 11.5–15.5)
WBC: 4.5 10*3/uL (ref 4.0–10.5)
nRBC: 0.4 % — ABNORMAL HIGH (ref 0.0–0.2)

## 2020-11-18 LAB — COMPREHENSIVE METABOLIC PANEL
ALT: 18 U/L (ref 0–44)
AST: 23 U/L (ref 15–41)
Albumin: 4.3 g/dL (ref 3.5–5.0)
Alkaline Phosphatase: 46 U/L (ref 38–126)
Anion gap: 9 (ref 5–15)
BUN: 12 mg/dL (ref 8–23)
CO2: 25 mmol/L (ref 22–32)
Calcium: 9.1 mg/dL (ref 8.9–10.3)
Chloride: 104 mmol/L (ref 98–111)
Creatinine, Ser: 0.95 mg/dL (ref 0.44–1.00)
GFR, Estimated: >60 mL/min (ref >60)
Glucose, Bld: 111 mg/dL — ABNORMAL HIGH (ref 70–99)
Potassium: 3.7 mmol/L (ref 3.5–5.1)
Sodium: 138 mmol/L (ref 135–145)
Total Bilirubin: 0.5 mg/dL (ref 0.3–1.2)
Total Protein: 7.1 g/dL (ref 6.5–8.1)

## 2020-11-18 NOTE — Progress Notes (Signed)
Patient denies new problems/concerns today.   °

## 2020-11-18 NOTE — Telephone Encounter (Signed)
No answer. Left voicemail to review results of ct abdomen/pelvis.

## 2020-11-18 NOTE — Telephone Encounter (Signed)
Call pt Good news. She doenst need to return to endocrine Dr Gabriel Carina for re-evaluation of left adrenal nodule. It is stable and she had a normal work up in June 2020.   We will continue to see and follow left adrenal nodule on annual ct chest lung cancer screens.

## 2020-11-18 NOTE — Progress Notes (Signed)
Hematology/Oncology Follow Up Note [facility]  [phone] [phone]  Patient Care Team: Burnard Hawthorne, FNP as PCP - General (Family Medicine) Clent Jacks, RN as Oncology Nurse Navigator   Name of the patient: Linda Schmitt  [id number]  [birth year]   REASON FOR VISIT  follow-up for serous carcinoma  PERTINENT ONCOLOGY HISTORY Linda Schmitt is a 65 y.o.afemale who has above oncology history reviewed by me today presented for follow up visit for management of malignant pleural effusion status post thoracentesis x2 during her recent admission. Cytology was positive for metastatic carcinoma.  TTF-1 Napsin A, GATA3, CDX2 were negative. Positive for PAX 8, which is most often expressing tumors of gynecological and renal origin. PET scan was independently reviewed by me and discussed with patient.  I also discussed with radiology. Exam showed evidence of peritoneal disease within the abdomen, soft tissue infiltrating into the omentum.  Large loculated left pleural effusion with thin FDG avid rind of soft tissue overlying the left lung.  Small right pleural effusion with mild FDG uptake is equivocal for malignant effusion of the right hemithorax. Fluid density structure within the right side of the pelvis abutting the right-sided of uterus is noted.  This is indeterminate.  Further investigation with pelvic sonogram may be helpful.  Pelvic ultrasound was obtained.-Heterogeneous myometrium with poor definition of endometrial complex. Normal left ovary.  No cystic right adnexal lesion is identified.  No a probable small normal-appearing right ovary is identified.  Suspect that the low-attenuation presumed cystic structure seen in the right adnexa on the prior image corresponds to a small amount of nonspecific free fluid in the right adnexa. I discussed with gynecology oncology Dr.Secord.  Given that cytology from left pleural effusion is  positive for metastatic carcinoma, tumor cells are positive for PAX 8 which indicates GYN origin.  Given that patient has moderate to large amount of pleural effusion, heavy tumor burden, recommend to start chemotherapy as soon as possible with carboplatin AUC 5-6 and Taxol 175 mg/m every 3 weeks for 3 cycles.   #Pre-existing neuropathy of left hand, she takes gabapentin. # seen by Dr. Theora Gianotti on 04/30/2020 and she recommends to proceed with the fourth cycle of chemotherapy and patient will be seen by Dr. Theora Gianotti again on 05/27/2020 for debulking surgery plan on 06/05/2020 with cardiothoracic surgery for thoracoscopy possible thoracic tumor debulking.  Patient was recommended to have to repeat CT of the chest after her chemotherapy.  Oncologic chemotherapy treatments 03/04/2020- 9/28/201 4 cycles of neoadjuvant carbo and Taxol  06/05/2020, status post Total hysterectomy and bilateral salpingo-oophorectomy, Omentectomy, and Optimal (R<1) interval tumor debulking. A. Peritoneal nodule, excisional biopsy: Fibroadipose tissue with calcifications, negative for tumor. B. Uterus, bilateral ovaries and fallopian tubes, hysterectomy and bilateral salpingo-oophorectomy: Cervix: Squamous metaplasia. Endometrium: Inactive endometrium. Myometrium: Adenomyosis. Serosa: High grade serous adenocarcinoma. Right ovary: High grade serous adenocarcinoma, involving the ovarian surface and parenchyma. Right fallopian tube: No specific pathologic change. Left ovary: High grade serous adenocarcinoma, involving the ovarian surface and parenchyma. Left fallopian tube: High grade serous adenocarcinoma, involving tubal fimbria. Serous tubal intraepithelial carcinoma. Immunohistochemistry was performed on block B14 at [facility] to characterize the pathologic process and demonstrates the following immunophenotype in the cells of interest:POSITIVE:  p53, p16 (strong, patchy), Ki67 (elevated) This staining profile supports  the above diagnosis. C. Peritoneal nodule, left pericolic gutter, excisional biopsy: High grade serous adenocarcinoma. D. Cul-de-sac, excisional biopsy: High grade serous adenocarcinoma. E. Omentum, excision: High grade serous adenocarcinoma (  up to 3.0 cm).  TUMOR  Tumor Site:  Left fallopian tube  Histologic Type:  Serous carcinoma  Histologic Grade:  High grade  Tumor Size:  Greatest Dimension (Centimeters): 0.2 cm  Ovarian Surface Involvement:  Present   Laterality:  Bilateral  Fallopian Tube Surface Involvement:  Present   Laterality:  Left  Implants:  Present (sites): left peritoneum, cul-de-sac, omentum  Other Tissue / Organ Involvement:  Right ovary  Other Tissue / Organ Involvement:  Left ovary  Other Tissue / Organ Involvement:  Left fallopian tube  Other Tissue / Organ Involvement:  Pelvic peritoneum  Other Tissue / Organ Involvement:  Omentum  Largest Extrapelvic Peritoneal Focus:  Macroscopic (greater than 2 cm)  Peritoneal / Ascitic Fluid:  Not submitted / unknown  Pleural Fluid:  Not submitted / unknown  Treatment Effect:  Moderate response identified (CRS 2)   LYMPH NODES  Regional Lymph Nodes:  No lymph nodes submitted or found   PATHOLOGIC STAGE CLASSIFICATION (pTNM, AJCC 8th Edition)  TNM Descriptors:  y (post-treatment)  Primary Tumor (pT):  pT3c  Regional Lymph Nodes (pN):  pNX FIGO Stage:  IIIC   + pleural effusion so STAGE IV No germline BRCA mutation, -Homologous recombination deficiency positive.  Myriad Genomic instability score:  positive, somatic BRCA1 positive  07/01/20- 08/12/2020 3 cycles of adjuvant chemotherapy with carboplatin AUC 5, Taxol 175 mg/m. 09/02/2020, started on olaparib 300 mg twice daily.  INTERVAL HISTORY 65 year old female presents for follow-up of stage IV ovarian cancer,  somatic BRCA1 positive and HRD positive.  Patient has been on olaparib since January  2022.   She feels well today.  She was seen by [facility] in March 2022 and had pelvic exam.  At that time patient had some suprapubic pressure and pain of unclear etiology.  A CT chest abdomen pelvis was obtained to evaluate further. Today she reports no symptoms are better. Neuropathy is better after adding nortriptyline  Review of Systems  Constitutional: Negative for appetite change, chills, fatigue and fever.  HENT:   Negative for hearing loss and voice change.   Eyes: Negative for eye problems.  Respiratory: Negative for chest tightness and cough.   Cardiovascular: Negative for chest pain.  Gastrointestinal: Negative for abdominal distention, abdominal pain and blood in stool.  Endocrine: Negative for hot flashes.  Genitourinary: Negative for difficulty urinating and frequency.   Musculoskeletal: Positive for back pain. Negative for arthralgias.  Skin: Negative for itching and rash.  Neurological: Positive for numbness. Negative for extremity weakness.       Pre-existing neuropathy, worse.   Hematological: Negative for adenopathy.  Psychiatric/Behavioral: Negative for confusion.      No Known Allergies   Past Medical History:  Diagnosis Date  . Family history of breast cancer   . Genetic testing 04/09/2020   No pathogenic variants identified on the Myriad MyRisk (germline) panel. The report date is 04/07/2020.   The [facility] gene panel offered by [employer] includes sequencing and deletion/duplication testing of the following 35 genes: APC, ATM, AXIN2, BARD1, BMPR1A, BRCA1, BRCA2, BRIP1, CHD1, CDK4, CDKN2A, CHEK2, EPCAM (large rearrangement only), HOXB13, GALNT12, MLH1, MSH2, MSH3, MSH6  . HLD (hyperlipidemia)      Past Surgical History:  Procedure Laterality Date  . CARPAL TUNNEL RELEASE Left   . ROBOTIC ASSISTED TOTAL HYSTERECTOMY WITH BILATERAL SALPINGO OOPHERECTOMY  06/05/2020   with omentectomy tumor debulking via mini-laparotomy/hand assisted port  .  SHOULDER ARTHROSCOPY WITH SUBACROMIAL DECOMPRESSION AND OPEN ROTATOR C Right 02/01/2020  Procedure: RIGHT SHOULDER ARTHROSCOPIC SUBSCAPULARIS REPAIR, ROTATOR CUFF REPAIR, SUABCROMIAL DECOMPRESSION, AND BICEPS TENODESIS.;  Surgeon: Leim Fabry, MD;  Location: [facility] ORS;  Service: Orthopedics;  Laterality: Right;  . TUBAL LIGATION      Social History   Socioeconomic History  . Marital status: Single    Spouse name: Not on file  . Number of children: Not on file  . Years of education: Not on file  . Highest education level: Not on file  Occupational History  . Not on file  Tobacco Use  . Smoking status: Former Smoker    Packs/day: 0.75    Years: 40.00    Pack years: 30.00    Types: Cigarettes    Quit date: 02/06/2018    Years since quitting: 2.7  . Smokeless tobacco: Never Used  . Tobacco comment: quit 02/2018  Vaping Use  . Vaping Use: Never used  Substance and Sexual Activity  . Alcohol use: Yes    Comment: 2-3 glasses of wine per week  . Drug use: No  . Sexual activity: Not on file  Other Topics Concern  . Not on file  Social History Narrative   Lives in [place] alone. Work - [employer]   Diet: Regular   Exercise: walking   Social Determinants of Radio broadcast assistant Strain: Not on file  Food Insecurity: No Landscape architect  . Worried About Charity fundraiser in the Last Year: Never true  . Ran Out of Food in the Last Year: Never true  Transportation Needs: No Transportation Needs  . Lack of Transportation (Medical): No  . Lack of Transportation (Non-Medical): No  Physical Activity: Not on file  Stress: No Stress Concern Present  . Feeling of Stress : Not at all  Social Connections: Not on file  Intimate Partner Violence: Not At Risk  . Fear of Current or Ex-Partner: No  . Emotionally Abused: No  . Physically Abused: No  . Sexually Abused: No    Family History  Problem Relation Age of Onset  . Cancer Mother        lymphoma  . Stroke Sister   .  Cancer Brother        lung  . Cancer Sister        lung  . Lung cancer Brother   . Bone cancer Sister   . Breast cancer Cousin 49       maternal  . Cancer Maternal Aunt        unk types     Current Outpatient Medications:  .  acetaminophen (TYLENOL) 500 MG tablet, Take 2 tablets (1,000 mg total) by mouth every 8 (eight) hours., Disp: 90 tablet, Rfl: 2 .  cholecalciferol (VITAMIN D3) 10 MCG (400 UNIT) TABS tablet, Take 800 Units by mouth daily., Disp: , Rfl:  .  cyanocobalamin 1000 MCG tablet, Take 400 mcg by mouth daily., Disp: , Rfl:  .  gabapentin (NEURONTIN) 300 MG capsule, Take 1 capsule (300 mg total) by mouth 2 (two) times daily. (Patient taking differently: Take 300 mg by mouth 2 (two) times daily. 1 QAM 2 QHS, 1 tab mid day prn), Disp: 60 capsule, Rfl: 2 .  meloxicam (MOBIC) 15 MG tablet, Take 1 tablet by mouth daily., Disp: , Rfl:  .  nortriptyline (PAMELOR) 10 MG capsule, TAKE 1 CAPSULE BY MOUTH AT BEDTIME., Disp: 30 capsule, Rfl: 0 .  olaparib (LYNPARZA) 150 MG tablet, Take 2 tablets (300 mg total) by mouth 2 (two) times daily. Swallow  whole. May take with food to decrease nausea and vomiting., Disp: 120 tablet, Rfl: 2 .  potassium chloride SA (KLOR-CON) 20 MEQ tablet, Take 1 tablet (20 mEq total) by mouth daily., Disp: 2 tablet, Rfl: 0 .  pravastatin (PRAVACHOL) 40 MG tablet, TAKE 1 TABLET(40 MG) BY MOUTH DAILY, Disp: 90 tablet, Rfl: 0  Physical exam:  Vitals:   11/18/20 1008  BP: 138/77  Pulse: 72  Resp: 16  Temp: 97.7 F (36.5 C)  Weight: 236 lb (107 kg)   Physical Exam Constitutional:      General: She is not in acute distress. HENT:     Head: Normocephalic and atraumatic.  Eyes:     General: No scleral icterus. Cardiovascular:     Rate and Rhythm: Normal rate and regular rhythm.     Heart sounds: Normal heart sounds.  Pulmonary:     Effort: Pulmonary effort is normal. No respiratory distress.     Breath sounds: No wheezing.  Abdominal:     General:  Bowel sounds are normal. There is no distension.     Palpations: Abdomen is soft.  Musculoskeletal:        General: No deformity. Normal range of motion.     Cervical back: Normal range of motion and neck supple.  Skin:    General: Skin is warm and dry.     Findings: No erythema or rash.  Neurological:     Mental Status: She is alert and oriented to person, place, and time. Mental status is at baseline.     Cranial Nerves: No cranial nerve deficit.     Coordination: Coordination normal.  Psychiatric:        Mood and Affect: Mood normal.         CMP Latest Ref Rng & Units 11/18/2020  Glucose 70 - 99 mg/dL 111(H)  BUN 8 - 23 mg/dL 12  Creatinine 0.44 - 1.00 mg/dL 0.95  Sodium 135 - 145 mmol/L 138  Potassium 3.5 - 5.1 mmol/L 3.7  Chloride 98 - 111 mmol/L 104  CO2 22 - 32 mmol/L 25  Calcium 8.9 - 10.3 mg/dL 9.1  Total Protein 6.5 - 8.1 g/dL 7.1  Total Bilirubin 0.3 - 1.2 mg/dL 0.5  Alkaline Phos 38 - 126 U/L 46  AST 15 - 41 U/L 23  ALT 0 - 44 U/L 18   CBC Latest Ref Rng & Units 11/18/2020  WBC 4.0 - 10.5 K/uL 4.5  Hemoglobin 12.0 - 15.0 g/dL 10.4(L)  Hematocrit 36.0 - 46.0 % 31.7(L)  Platelets 150 - 400 K/uL 155    RADIOGRAPHIC STUDIES: I have personally reviewed the radiological images as listed and agreed with the findings in the report. CT CHEST ABDOMEN PELVIS W CONTRAST  Result Date: 11/16/2020 CLINICAL DATA:  Abdominal pain, metastatic fallopian tube cancer, status post hysterectomy and bilateral salpingectomy and chemotherapy EXAM: CT CHEST, ABDOMEN, AND PELVIS WITH CONTRAST TECHNIQUE: Multidetector CT imaging of the chest, abdomen and pelvis was performed following the standard protocol during bolus administration of intravenous contrast. CONTRAST:  176mL OMNIPAQUE IOHEXOL 300 MG/ML SOLN, additional oral enteric contrast COMPARISON:  CT chest abdomen pelvis, 04/24/2020, CT chest, 05/23/2016 FINDINGS: CT CHEST FINDINGS Cardiovascular: No significant vascular findings.  Normal heart size. No pericardial effusion. Mediastinum/Nodes: No enlarged mediastinal, hilar, or axillary lymph nodes. Thyroid gland, trachea, and esophagus demonstrate no significant findings. Lungs/Pleura: Interval resolution of a previously seen moderate left pleural effusion. There is minimal residual pleural thickening (series 2, image 43). Musculoskeletal: No chest wall  mass or suspicious bone lesions identified. CT ABDOMEN PELVIS FINDINGS Hepatobiliary: No solid liver abnormality is seen. No gallstones, gallbladder wall thickening, or biliary dilatation. Pancreas: Unremarkable. No pancreatic ductal dilatation or surrounding inflammatory changes. Spleen: Normal in size without significant abnormality. Adrenals/Urinary Tract: Stable, benign adenoma of the lateral limb of the left adrenal gland, seen on examinations dating back to 2017. Kidneys are normal, without renal calculi, solid lesion, or hydronephrosis. Bladder is unremarkable. Stomach/Bowel: Stomach is within normal limits. Appendix appears normal. No evidence of bowel wall thickening, distention, or inflammatory changes. Vascular/Lymphatic: Scattered aortic atherosclerosis. No enlarged abdominal or pelvic lymph nodes. Reproductive: Status post interval hysterectomy and bilateral salpingectomy. Other: No abdominal wall hernia or abnormality. No abdominopelvic ascites. Interval omentectomy. Unchanged small nodules adjacent to the spleen which are stable compared to examinations dating back to 2017 and likely small splenules and/or lymph nodes (series 2, image 61). Musculoskeletal: No acute or significant osseous findings. IMPRESSION: 1. Status post interval hysterectomy and bilateral salpingectomy. 2. Status post interval omentectomy. There is no residual suspicious peritoneal nodularity identified. Unchanged small nodules adjacent to the spleen which are stable compared to examinations dating back to 2017 and likely small splenules and/or lymph nodes.  Attention on follow-up 3. Interval resolution of a previously seen moderate left pleural effusion. There is minimal residual pleural thickening. 4. Findings are consistent with treatment response. 5. No evidence of new metastatic disease in the chest, abdomen, or pelvis. Aortic Atherosclerosis (ICD10-I70.0). Electronically Signed   By: Eddie Candle M.D.   On: 11/16/2020 18:26   MM 3D SCREEN BREAST BILATERAL  Result Date: 11/06/2020 CLINICAL DATA:  Screening. EXAM: DIGITAL SCREENING BILATERAL MAMMOGRAM WITH TOMOSYNTHESIS AND CAD TECHNIQUE: Bilateral screening digital craniocaudal and mediolateral oblique mammograms were obtained. Bilateral screening digital breast tomosynthesis was performed. The images were evaluated with computer-aided detection. COMPARISON:  Previous exam(s). ACR Breast Density Category b: There are scattered areas of fibroglandular density. FINDINGS: There are no findings suspicious for malignancy. The images were evaluated with computer-aided detection. IMPRESSION: No mammographic evidence of malignancy. A result letter of this screening mammogram will be mailed directly to the patient. RECOMMENDATION: Screening mammogram in one year. (Code:[id number]-B-01Y) BI-RADS CATEGORY  1: Negative. Electronically Signed   By: Lillia Mountain M.D.   On: 11/06/2020 12:39     Assessment and plan Patient is a 65 y.o. female presents for follow-up for FIGO IIIC ovarian cancer, ( somatic BRCA1 positive, HRD positive). 1. Carcinoma of fallopian tube, unspecified laterality ([place])   2. Encounter for antineoplastic chemotherapy   3. Neuropathy   4. Malignant pleural effusion    #FIGO IVA Fallopian tube serous carcinoma- + pleural effusion cytology S/p 4 cycles of neoadjuvant chemotherapy  Carboplatin /Taxol  S/p total hysterectomy with salpingo-oophorectomy and omentectomy. Optimal (R<1) interval tumor debulking. S/p 3 cycles of adjuvant carbopltain  Taxol  # HRD, patient is clinically doing well.  CA-125  is being followed.   Labs reviewed and discussed with patient.  Continue olaparib 300mg  BID maintenance.  Interval CT chest abdomen pelvis showed no residual suspicious peritoneal nodularity identified.  Unchanged small nodules adjacent to the spleen which are stable since 2017.  Interval resolution of previously seen moderate left pleural effusion.  Minimal residual pleural thickening.  #chemotherapy induced neuropathy, continue gabapentin current regimen, 600 mg twice daily plus another 300 mg midday as needed.  Continue nortriptyline 10mg  QHS. She has been referred previously to acupuncture clinic. Marland Kitchen #Anemia, likely secondary to recent surgery.  Hemoglobin is 10.4 stable.  Continue to monitor.  We spent sufficient time to discuss many aspect of care, questions were answered to patient's satisfaction.  Follow-up in 4 weeks   Earlie Server, MD, PhD Hematology Oncology [facility] at [facility], [place] Pager- [phone] 11/18/2020

## 2020-11-18 NOTE — Telephone Encounter (Signed)
Pt notified of below. She stated too that she missed Gi's call & have tried ti call them back a few times. She had to leave them a message. I told her that if she did not hear in the next few days to please let me know. It may just a take them some time to get through Vm's & call patient's back. Pt stated that she will let us know if she does not hear.

## 2020-11-18 NOTE — Telephone Encounter (Signed)
LMTCB in regards to lab results.  

## 2020-11-19 LAB — CA 125: Cancer Antigen (CA) 125: 6.9 U/mL (ref 0.0–38.1)

## 2020-11-24 ENCOUNTER — Ambulatory Visit: Payer: Medicare Other

## 2020-12-01 ENCOUNTER — Other Ambulatory Visit: Payer: Self-pay

## 2020-12-01 ENCOUNTER — Ambulatory Visit: Payer: Medicare HMO | Admitting: Gastroenterology

## 2020-12-01 ENCOUNTER — Encounter: Payer: Self-pay | Admitting: Gastroenterology

## 2020-12-01 ENCOUNTER — Telehealth: Payer: Self-pay

## 2020-12-01 VITALS — BP 130/82 | HR 83 | Temp 97.7°F | Ht 63.0 in | Wt 231.0 lb

## 2020-12-01 DIAGNOSIS — D649 Anemia, unspecified: Secondary | ICD-10-CM

## 2020-12-01 NOTE — Progress Notes (Signed)
Linda Schmitt  [place], [place] [address]  Main: [phone]  Fax: [phone]   Gastroenterology Consultation  Referring Provider:     Burnard Hawthorne, FNP Primary Care Physician:  Burnard Hawthorne, FNP Reason for Consultation:     Normocytic anemia        HPI:   Chief complaint: Anemia  Linda Schmitt is a 65 y.o. y/o female referred for consultation & management  by Dr. Vidal Schwalbe, Yvetta Coder, FNP.  Patient states she is here because her primary care doctor told her that she needs a colonoscopy, and that she has anemia.  She absolutely denies any blood in her stool, abdominal pain, nausea or vomiting.  Denies any constipation or diarrhea.  No dysphagia.  Reports having a colonoscopy 10 years ago that was normal.  Procedure report not available.  Patient found to have normocytic anemia.  Serum iron and iron saturation were low in October 2021.  Ferritin was elevated at that time.  However, patient had undergone surgery/total hysterectomy at that time and anemia was being attributed to that.  I do not see a need to repeat labs for ferritin or iron since then  Patient is short with her answers, and rude when  being asked questions  Past Medical History:  Diagnosis Date  . Family history of breast cancer   . Genetic testing 04/09/2020   No pathogenic variants identified on the [employer] MyRisk (germline) panel. The report date is 04/07/2020.   The [facility] gene panel offered by [employer] includes sequencing and deletion/duplication testing of the following 35 genes: APC, ATM, AXIN2, BARD1, BMPR1A, BRCA1, BRCA2, BRIP1, CHD1, CDK4, CDKN2A, CHEK2, EPCAM (large rearrangement only), HOXB13, GALNT12, MLH1, MSH2, MSH3, MSH6  . HLD (hyperlipidemia)     Past Surgical History:  Procedure Laterality Date  . CARPAL TUNNEL RELEASE Left   . ROBOTIC ASSISTED TOTAL HYSTERECTOMY WITH BILATERAL SALPINGO OOPHERECTOMY  06/05/2020   with  omentectomy tumor debulking via mini-laparotomy/hand assisted port  . SHOULDER ARTHROSCOPY WITH SUBACROMIAL DECOMPRESSION AND OPEN ROTATOR C Right 02/01/2020   Procedure: RIGHT SHOULDER ARTHROSCOPIC SUBSCAPULARIS REPAIR, ROTATOR CUFF REPAIR, SUABCROMIAL DECOMPRESSION, AND BICEPS TENODESIS.;  Surgeon: Leim Fabry, MD;  Location: [facility] ORS;  Service: Orthopedics;  Laterality: Right;  . TUBAL LIGATION      Prior to Admission medications   Medication Sig Start Date End Date Taking? Authorizing Provider  acetaminophen (TYLENOL) 500 MG tablet Take 2 tablets (1,000 mg total) by mouth every 8 (eight) hours. 02/01/20 01/31/21 Yes Leim Fabry, MD  cholecalciferol (VITAMIN D3) 10 MCG (400 UNIT) TABS tablet Take 800 Units by mouth daily.   Yes [provider]  cyanocobalamin 1000 MCG tablet Take 400 mcg by mouth daily.   Yes [provider]  gabapentin (NEURONTIN) 300 MG capsule Take 1 capsule (300 mg total) by mouth 2 (two) times daily. Patient taking differently: Take 300 mg by mouth 2 (two) times daily. 1 QAM 2 QHS, 1 tab mid day prn 08/20/20  Yes Earlie Server, MD  meloxicam (MOBIC) 15 MG tablet Take 1 tablet by mouth daily. 11/11/20  Yes [provider]  nortriptyline (PAMELOR) 10 MG capsule TAKE 1 CAPSULE BY MOUTH AT BEDTIME. 11/10/20  Yes Earlie Server, MD  olaparib [facility]) 150 MG tablet Take 2 tablets (300 mg total) by mouth 2 (two) times daily. Swallow whole. May take with food to decrease nausea and vomiting. 11/03/20  Yes Earlie Server, MD  potassium chloride SA (KLOR-CON)  20 MEQ tablet Take 1 tablet (20 mEq total) by mouth daily. 07/22/20  Yes Earlie Server, MD  pravastatin (PRAVACHOL) 40 MG tablet TAKE 1 TABLET(40 MG) BY MOUTH DAILY 09/22/20  Yes Burnard Hawthorne, FNP    Family History  Problem Relation Age of Onset  . Cancer Mother        lymphoma  . Stroke Sister   . Cancer Brother        lung  . Cancer Sister        lung  . Lung cancer Brother   . Bone cancer Sister   . Breast  cancer Cousin 71       maternal  . Cancer Maternal Aunt        unk types     Social History   Tobacco Use  . Smoking status: Former Smoker    Packs/day: 0.75    Years: 40.00    Pack years: 30.00    Types: Cigarettes    Quit date: 02/06/2018    Years since quitting: 2.8  . Smokeless tobacco: Never Used  . Tobacco comment: quit 02/2018  Vaping Use  . Vaping Use: Never used  Substance Use Topics  . Alcohol use: Yes    Comment: 2-3 glasses of wine per week  . Drug use: No    Allergies as of 12/01/2020  . (No Known Allergies)    Review of Systems:    All systems reviewed and negative except where noted in HPI.   Physical Exam:  BP 130/82   Pulse 83   Temp 97.7 F (36.5 C) (Oral)   Ht 5' 3"  (1.6 m)   Wt 231 lb (104.8 kg)   BMI 40.92 kg/m  No LMP recorded. Patient is postmenopausal. Psych:  Alert and cooperative. Normal mood and affect. General:   Alert,  Well-developed, well-nourished, pleasant and cooperative in NAD Head:  Normocephalic and atraumatic. Eyes:  Sclera clear, no icterus.   Conjunctiva pink. Ears:  Normal auditory acuity. Nose:  No deformity, discharge, or lesions. Mouth:  No deformity or lesions,oropharynx pink & moist. Neck:  Supple; no masses or thyromegaly. Abdomen:  Normal bowel sounds.  No bruits.  Soft, non-tender and non-distended without masses, hepatosplenomegaly or hernias noted.  No guarding or rebound tenderness.    Msk:  Symmetrical without gross deformities. Good, equal movement & strength bilaterally. Pulses:  Normal pulses noted. Extremities:  No clubbing or edema.  No cyanosis. Neurologic:  Alert and oriented x3;  grossly normal neurologically. Skin:  Intact without significant lesions or rashes. No jaundice. Lymph Nodes:  No significant cervical adenopathy. Psych:  Alert and cooperative. Normal mood and affect.   Labs: CBC    Component Value Date/Time   WBC 4.5 11/18/2020 0926   RBC 3.18 (L) 11/18/2020 0926   HGB 10.4 (L)  11/18/2020 0926   HCT 31.7 (L) 11/18/2020 0926   PLT 155 11/18/2020 0926   MCV 99.7 11/18/2020 0926   MCH 32.7 11/18/2020 0926   MCHC 32.8 11/18/2020 0926   RDW 16.3 (H) 11/18/2020 0926   LYMPHSABS 1.4 11/18/2020 0926   MONOABS 0.3 11/18/2020 0926   EOSABS 0.1 11/18/2020 0926   BASOSABS 0.0 11/18/2020 0926   CMP     Component Value Date/Time   NA 138 11/18/2020 0926   K 3.7 11/18/2020 0926   CL 104 11/18/2020 0926   CO2 25 11/18/2020 0926   GLUCOSE 111 (H) 11/18/2020 0926   BUN 12 11/18/2020 0926   CREATININE 0.95 11/18/2020  4580   CALCIUM 9.1 11/18/2020 0926   PROT 7.1 11/18/2020 0926   ALBUMIN 4.3 11/18/2020 0926   AST 23 11/18/2020 0926   ALT 18 11/18/2020 0926   ALKPHOS 46 11/18/2020 0926   BILITOT 0.5 11/18/2020 0926   GFRNONAA >60 11/18/2020 0926   GFRAA >60 05/06/2020 0801    Imaging Studies: CT CHEST ABDOMEN PELVIS W CONTRAST  Result Date: 11/16/2020 CLINICAL DATA:  Abdominal pain, metastatic fallopian tube cancer, status post hysterectomy and bilateral salpingectomy and chemotherapy EXAM: CT CHEST, ABDOMEN, AND PELVIS WITH CONTRAST TECHNIQUE: Multidetector CT imaging of the chest, abdomen and pelvis was performed following the standard protocol during bolus administration of intravenous contrast. CONTRAST:  145m OMNIPAQUE IOHEXOL 300 MG/ML SOLN, additional oral enteric contrast COMPARISON:  CT chest abdomen pelvis, 04/24/2020, CT chest, 05/23/2016 FINDINGS: CT CHEST FINDINGS Cardiovascular: No significant vascular findings. Normal heart size. No pericardial effusion. Mediastinum/Nodes: No enlarged mediastinal, hilar, or axillary lymph nodes. Thyroid gland, trachea, and esophagus demonstrate no significant findings. Lungs/Pleura: Interval resolution of a previously seen moderate left pleural effusion. There is minimal residual pleural thickening (series 2, image 43). Musculoskeletal: No chest wall mass or suspicious bone lesions identified. CT ABDOMEN PELVIS FINDINGS  Hepatobiliary: No solid liver abnormality is seen. No gallstones, gallbladder wall thickening, or biliary dilatation. Pancreas: Unremarkable. No pancreatic ductal dilatation or surrounding inflammatory changes. Spleen: Normal in size without significant abnormality. Adrenals/Urinary Tract: Stable, benign adenoma of the lateral limb of the left adrenal gland, seen on examinations dating back to 2017. Kidneys are normal, without renal calculi, solid lesion, or hydronephrosis. Bladder is unremarkable. Stomach/Bowel: Stomach is within normal limits. Appendix appears normal. No evidence of bowel wall thickening, distention, or inflammatory changes. Vascular/Lymphatic: Scattered aortic atherosclerosis. No enlarged abdominal or pelvic lymph nodes. Reproductive: Status post interval hysterectomy and bilateral salpingectomy. Other: No abdominal wall hernia or abnormality. No abdominopelvic ascites. Interval omentectomy. Unchanged small nodules adjacent to the spleen which are stable compared to examinations dating back to 2017 and likely small splenules and/or lymph nodes (series 2, image 61). Musculoskeletal: No acute or significant osseous findings. IMPRESSION: 1. Status post interval hysterectomy and bilateral salpingectomy. 2. Status post interval omentectomy. There is no residual suspicious peritoneal nodularity identified. Unchanged small nodules adjacent to the spleen which are stable compared to examinations dating back to 2017 and likely small splenules and/or lymph nodes. Attention on follow-up 3. Interval resolution of a previously seen moderate left pleural effusion. There is minimal residual pleural thickening. 4. Findings are consistent with treatment response. 5. No evidence of new metastatic disease in the chest, abdomen, or pelvis. Aortic Atherosclerosis (ICD10-I70.0). Electronically Signed   By: AEddie CandleM.D.   On: 11/16/2020 18:26   MM 3D SCREEN BREAST BILATERAL  Result Date: 11/06/2020 CLINICAL  DATA:  Screening. EXAM: DIGITAL SCREENING BILATERAL MAMMOGRAM WITH TOMOSYNTHESIS AND CAD TECHNIQUE: Bilateral screening digital craniocaudal and mediolateral oblique mammograms were obtained. Bilateral screening digital breast tomosynthesis was performed. The images were evaluated with computer-aided detection. COMPARISON:  Previous exam(s). ACR Breast Density Category b: There are scattered areas of fibroglandular density. FINDINGS: There are no findings suspicious for malignancy. The images were evaluated with computer-aided detection. IMPRESSION: No mammographic evidence of malignancy. A result letter of this screening mammogram will be mailed directly to the patient. RECOMMENDATION: Screening mammogram in one year. (Code:SM-B-01Y) BI-RADS CATEGORY  1: Negative. Electronically Signed   By: DLillia MountainM.D.   On: 11/06/2020 12:39    Assessment and Plan:   Linda Schmitt  is a 65 y.o. y/o female has been referred for anemia  Patient denies any symptoms of GI bleed  We discussed that patient's anemia is normocytic and I would need further labs to see if she has iron deficiency.  Patient is refusing to get labs drawn and states she would rather follow-up with her PCP in this regard.  I explained to her the reason for her labs in detail but she refuses at this time  I also discussed that if she does have iron deficiency, I would recommend an upper endoscopy along with a colonoscopy.  She specifically states "no one is going in my stomach".  I did discuss with her that an upper endoscopy is done with a camera and this is not a surgery, and explained to her the details of the procedure.  She absolutely refuses to do an upper endoscopy even if it is indicated.    She is due for her screening colonoscopy.  She stated she is willing to schedule this, however when clinic staff went in to schedule it, she replied rudely and states that she would call us back when she is ready to schedule.  Patient does not  want to make a follow-up appointment at this time either.  I have encouraged her to call us back with any questions or concerns and to schedule her procedures as discussed above in detail  Dr Linda Antigua  Speech recognition software was used to dictate the above note.

## 2020-12-01 NOTE — Telephone Encounter (Signed)
Patient says it's fine to have the procedure on 12/10/20. Please call to schedule

## 2020-12-02 ENCOUNTER — Other Ambulatory Visit: Payer: Self-pay

## 2020-12-02 DIAGNOSIS — Z1211 Encounter for screening for malignant neoplasm of colon: Secondary | ICD-10-CM

## 2020-12-02 MED ORDER — NA SULFATE-K SULFATE-MG SULF 17.5-3.13-1.6 GM/177ML PO SOLN: ORAL | 0 refills | Status: DC

## 2020-12-02 NOTE — Telephone Encounter (Signed)
Called patient to let her know that I will go ahead and schedule her procedure to be done on 12/10/2020 at [place]. Patient stated that she will come in to the office to pick up her instructions since her procedure is coming up soon. Instructions will be left at the front desk.

## 2020-12-03 ENCOUNTER — Ambulatory Visit: Payer: Medicare HMO

## 2020-12-10 ENCOUNTER — Ambulatory Visit: Payer: Medicare HMO | Admitting: Registered Nurse

## 2020-12-10 ENCOUNTER — Encounter: Payer: Self-pay | Admitting: Gastroenterology

## 2020-12-10 ENCOUNTER — Encounter
Admission: RE | Disposition: A | Discharge status: Home / Self Care | Payer: Self-pay | Source: Home / Self Care | Attending: Gastroenterology

## 2020-12-10 ENCOUNTER — Other Ambulatory Visit: Payer: Self-pay

## 2020-12-10 ENCOUNTER — Ambulatory Visit
Admission: RE | Admit: 2020-12-10 | Discharge: 2020-12-10 | Disposition: A | Discharge status: Home / Self Care | Payer: Medicare HMO | Attending: Gastroenterology | Admitting: Gastroenterology

## 2020-12-10 DIAGNOSIS — Z1211 Encounter for screening for malignant neoplasm of colon: Secondary | ICD-10-CM | POA: Diagnosis not present

## 2020-12-10 DIAGNOSIS — Z791 Long term (current) use of non-steroidal anti-inflammatories (NSAID): Secondary | ICD-10-CM | POA: Insufficient documentation

## 2020-12-10 DIAGNOSIS — K648 Other hemorrhoids: Secondary | ICD-10-CM | POA: Insufficient documentation

## 2020-12-10 DIAGNOSIS — K635 Polyp of colon: Secondary | ICD-10-CM

## 2020-12-10 DIAGNOSIS — Z79899 Other long term (current) drug therapy: Secondary | ICD-10-CM | POA: Diagnosis not present

## 2020-12-10 DIAGNOSIS — D126 Benign neoplasm of colon, unspecified: Secondary | ICD-10-CM | POA: Diagnosis not present

## 2020-12-10 DIAGNOSIS — Z87891 Personal history of nicotine dependence: Secondary | ICD-10-CM | POA: Insufficient documentation

## 2020-12-10 DIAGNOSIS — E785 Hyperlipidemia, unspecified: Secondary | ICD-10-CM | POA: Diagnosis not present

## 2020-12-10 HISTORY — PX: COLONOSCOPY WITH PROPOFOL: SHX5780

## 2020-12-10 HISTORY — DX: Malignant (primary) neoplasm, unspecified: C80.1

## 2020-12-10 SURGERY — COLONOSCOPY WITH PROPOFOL
Anesthesia: General

## 2020-12-10 MED ORDER — PROPOFOL 500 MG/50ML IV EMUL
INTRAVENOUS | Status: DC | PRN
  Administered 2020-12-10: 150 ug/kg/min via INTRAVENOUS

## 2020-12-10 MED ORDER — PROPOFOL 10 MG/ML IV BOLUS
INTRAVENOUS | Status: DC | PRN
  Administered 2020-12-10: 80 mg via INTRAVENOUS

## 2020-12-10 MED ORDER — LIDOCAINE HCL (PF) 2 % IJ SOLN
INTRAMUSCULAR | Status: AC
  Filled 2020-12-10: qty 5

## 2020-12-10 MED ORDER — SODIUM CHLORIDE 0.9 % IV SOLN: INTRAVENOUS | Status: DC

## 2020-12-10 MED ORDER — PROPOFOL 500 MG/50ML IV EMUL
INTRAVENOUS | Status: AC
  Filled 2020-12-10: qty 50

## 2020-12-10 MED ORDER — LIDOCAINE HCL (CARDIAC) PF 100 MG/5ML IV SOSY
PREFILLED_SYRINGE | INTRAVENOUS | Status: DC | PRN
  Administered 2020-12-10: 40 mg via INTRAVENOUS

## 2020-12-10 NOTE — Op Note (Signed)
[facility] Gastroenterology Patient Name: Linda Schmitt Procedure Date: 12/10/2020 7:46 AM MRN: [id number] Account #: [id number] Date of Birth: 01-22-1956 Admit Type: Outpatient Age: 65 Room: [facility] ENDO ROOM 3 Gender: Female Note Status: Finalized Procedure:             Colonoscopy Indications:           Screening for colorectal malignant neoplasm Providers:             Mckensie Scotti B. Bonna Gains MD, MD Medicines:             Monitored Anesthesia Care Complications:         No immediate complications. Procedure:             Pre-Anesthesia Assessment:                        - ASA Grade Assessment: II - A patient with mild                         systemic disease.                        - Prior to the procedure, a History and Physical was                         performed, and patient medications, allergies and                         sensitivities were reviewed. The patient's tolerance                         of previous anesthesia was reviewed.                        - The risks and benefits of the procedure and the                         sedation options and risks were discussed with the                         patient. All questions were answered and informed                         consent was obtained.                        - Patient identification and proposed procedure were                         verified prior to the procedure by the physician, the                         nurse, the anesthesiologist, the anesthetist and the                         technician. The procedure was verified in the                         procedure room.  After obtaining informed consent, the colonoscope was                         passed under direct vision. Throughout the procedure,                         the patient's blood pressure, pulse, and oxygen                         saturations were monitored continuously. The                         Colonoscope  was introduced through the anus and                         advanced to the the cecum, identified by appendiceal                         orifice and ileocecal valve. The colonoscopy was                         performed with ease. The patient tolerated the                         procedure well. The quality of the bowel preparation                         was good. Findings:      The perianal and digital rectal examinations were normal.      A 4 mm polyp was found in the transverse colon. The polyp was flat. The       polyp was removed with a jumbo cold forceps. Resection and retrieval       were complete.      The exam was otherwise without abnormality.      The rectum, sigmoid colon, descending colon, transverse colon, ascending       colon and cecum appeared normal.      Non-bleeding internal hemorrhoids were found during retroflexion. Impression:            - One 4 mm polyp in the transverse colon, removed with                         a jumbo cold forceps. Resected and retrieved.                        - The examination was otherwise normal.                        - The rectum, sigmoid colon, descending colon,                         transverse colon, ascending colon and cecum are normal.                        - Non-bleeding internal hemorrhoids. Recommendation:        - Discharge patient to home (with escort).                        -  Advance diet as tolerated.                        - Continue present medications.                        - Await pathology results.                        - Repeat colonoscopy date to be determined after                         pending pathology results are reviewed.                        - The findings and recommendations were discussed with                         the patient.                        - The findings and recommendations were discussed with                         the patient's family.                        - Return to primary care  physician as previously                         scheduled.                        - High fiber diet. Procedure Code(s):     --- Professional ---                        [phone], Colonoscopy, flexible; with biopsy, single or                         multiple Diagnosis Code(s):     --- Professional ---                        Z12.11, Encounter for screening for malignant neoplasm                         of colon                        K63.5, Polyp of colon CPT copyright 2019 American Medical Association. All rights reserved. The codes documented in this report are preliminary and upon coder review may  be revised to meet current compliance requirements.  Vonda Antigua, MD Margretta Sidle B. Bonna Gains MD, MD 12/10/2020 9:09:34 AM This report has been signed electronically. Number of Addenda: 0 Note Initiated On: 12/10/2020 7:46 AM Scope Withdrawal Time: 0 hours 18 minutes 6 seconds  Total Procedure Duration: 0 hours 20 minutes 55 seconds  Estimated Blood Loss:  Estimated blood loss: none.      [facility]

## 2020-12-10 NOTE — Anesthesia Preprocedure Evaluation (Signed)
Anesthesia Evaluation  Patient identified by MRN, date of birth, ID band Patient awake    Reviewed: Allergy & Precautions, NPO status , Patient's Chart, lab work & pertinent test results  History of Anesthesia Complications Negative for: history of anesthetic complications  Airway Mallampati: I  TM Distance: >3 FB Neck ROM: Full    Dental  (+) Partial Upper, Dental Advisory Given, Teeth Intact   Pulmonary neg pulmonary ROS, neg sleep apnea, neg COPD, Patient abstained from smoking.Not current smoker, former smoker,    Pulmonary exam normal breath sounds clear to auscultation       Cardiovascular Exercise Tolerance: Good METS(-) hypertension(-) CAD and (-) Past MI negative cardio ROS  (-) dysrhythmias  Rhythm:Regular Rate:Normal - Systolic murmurs    Neuro/Psych  Headaches, PSYCHIATRIC DISORDERS Depression    GI/Hepatic neg GERD  ,(+)     (-) substance abuse  ,   Endo/Other  neg diabetesMorbid obesity  Renal/GU negative Renal ROS     Musculoskeletal   Abdominal (+) + obese,   Peds  Hematology   Anesthesia Other Findings Past Medical History: No date: HLD (hyperlipidemia)  Reproductive/Obstetrics                             Anesthesia Physical  Anesthesia Plan  ASA: III  Anesthesia Plan: General   Post-op Pain Management:    Induction: Intravenous  PONV Risk Score and Plan: 4 or greater and TIVA and Propofol infusion  Airway Management Planned: Nasal Cannula  Additional Equipment: None  Intra-op Plan:   Post-operative Plan: Extubation in OR  Informed Consent: I have reviewed the patients History and Physical, chart, labs and discussed the procedure including the risks, benefits and alternatives for the proposed anesthesia with the patient or authorized representative who has indicated his/her understanding and acceptance.     Dental advisory given  Plan Discussed  with: CRNA and Surgeon  Anesthesia Plan Comments: (Discussed risks of anesthesia with patient, including PONV, sore throat, lip/dental damage. Rare risks discussed as well, such as cardiorespiratory and neurological sequelae. Patient understands.  Discussed r/b/a of interscalene block, including elective nature. Risks discussed: - Rare: bleeding, infection, nerve damage - shortness of breath from hemidiaphragmatic paralysis - unilateral horner's syndrome - poor/non-working blocks Patient understands and agrees. )        Anesthesia Quick Evaluation

## 2020-12-10 NOTE — Anesthesia Postprocedure Evaluation (Signed)
Anesthesia Post Note  Patient: Linda Schmitt  Procedure(s) Performed: COLONOSCOPY WITH PROPOFOL (N/A )  Patient location during evaluation: Endoscopy Anesthesia Type: General Level of consciousness: awake and alert Pain management: pain level controlled Vital Signs Assessment: post-procedure vital signs reviewed and stable Respiratory status: spontaneous breathing, nonlabored ventilation, respiratory function stable and patient connected to nasal cannula oxygen Cardiovascular status: blood pressure returned to baseline and stable Postop Assessment: no apparent nausea or vomiting Anesthetic complications: no   No complications documented.   Last Vitals:  Vitals:   12/10/20 0858 12/10/20 0918  BP: (!) 125/95 106/63  Pulse:    Resp:    Temp: (!) 36.1 C   SpO2:      Last Pain:  Vitals:   12/10/20 0928  TempSrc:   PainSc: 0-No pain                 Martha Clan

## 2020-12-10 NOTE — H&P (Signed)
Vonda Antigua, MD [address], [place], [place], [place], [address] [id number], [place], [place], [place], [address] Phone: [phone]  Fax: [phone]  Primary Care Physician:  Burnard Hawthorne, FNP   Pre-Procedure History & Physical: HPI:  Linda Schmitt is a 65 y.o. female is here for a colonoscopy.   Past Medical History:  Diagnosis Date  . Cancer ([place])    ovarian cancer  . Family history of breast cancer   . Genetic testing 04/09/2020   No pathogenic variants identified on the [facility] MyRisk (germline) panel. The report date is 04/07/2020.   The [facility] gene panel offered by [employer] includes sequencing and deletion/duplication testing of the following 35 genes: APC, ATM, AXIN2, BARD1, BMPR1A, BRCA1, BRCA2, BRIP1, CHD1, CDK4, CDKN2A, CHEK2, EPCAM (large rearrangement only), HOXB13, GALNT12, MLH1, MSH2, MSH3, MSH6  . HLD (hyperlipidemia)     Past Surgical History:  Procedure Laterality Date  . CARPAL TUNNEL RELEASE Left   . ROBOTIC ASSISTED TOTAL HYSTERECTOMY WITH BILATERAL SALPINGO OOPHERECTOMY  06/05/2020   with omentectomy tumor debulking via mini-laparotomy/hand assisted port  . SHOULDER ARTHROSCOPY WITH SUBACROMIAL DECOMPRESSION AND OPEN ROTATOR C Right 02/01/2020   Procedure: RIGHT SHOULDER ARTHROSCOPIC SUBSCAPULARIS REPAIR, ROTATOR CUFF REPAIR, SUABCROMIAL DECOMPRESSION, AND BICEPS TENODESIS.;  Surgeon: Leim Fabry, MD;  Location: [facility] ORS;  Service: Orthopedics;  Laterality: Right;  . TUBAL LIGATION      Prior to Admission medications   Medication Sig Start Date End Date Taking? Authorizing Provider  cholecalciferol (VITAMIN D3) 10 MCG (400 UNIT) TABS tablet Take 800 Units by mouth daily.   Yes [provider]  cyanocobalamin 1000 MCG tablet Take 400 mcg by mouth daily.   Yes [provider]  gabapentin (NEURONTIN) 300 MG capsule Take 1 capsule (300 mg total) by mouth 2 (two) times daily. Patient taking  differently: Take 300 mg by mouth 2 (two) times daily. 1 QAM 2 QHS, 1 tab mid day prn 08/20/20  Yes Earlie Server, MD  meloxicam (MOBIC) 15 MG tablet Take 1 tablet by mouth daily. 11/11/20  Yes [provider]  Na Sulfate-K Sulfate-Mg Sulf 17.5-3.13-1.6 GM/177ML SOLN At 5 PM the day before procedure take 1 bottle and 5 hours before procedure take 1 bottle. 12/02/20  Yes Vonda Antigua B, MD  nortriptyline (PAMELOR) 10 MG capsule TAKE 1 CAPSULE BY MOUTH AT BEDTIME. 11/10/20  Yes Earlie Server, MD  olaparib [facility]) 150 MG tablet Take 2 tablets (300 mg total) by mouth 2 (two) times daily. Swallow whole. May take with food to decrease nausea and vomiting. 11/03/20  Yes Earlie Server, MD  pravastatin (PRAVACHOL) 40 MG tablet TAKE 1 TABLET(40 MG) BY MOUTH DAILY 09/22/20  Yes Burnard Hawthorne, FNP  acetaminophen (TYLENOL) 500 MG tablet Take 2 tablets (1,000 mg total) by mouth every 8 (eight) hours. 02/01/20 01/31/21  Leim Fabry, MD  potassium chloride SA (KLOR-CON) 20 MEQ tablet Take 1 tablet (20 mEq total) by mouth daily. 07/22/20   Earlie Server, MD    Allergies as of 12/02/2020  . (No Known Allergies)    Family History  Problem Relation Age of Onset  . Cancer Mother        lymphoma  . Stroke Sister   . Cancer Brother        lung  . Cancer Sister        lung  . Lung cancer Brother   . Bone cancer Sister   . Breast cancer Cousin 86  maternal  . Cancer Maternal Aunt        unk types    Social History   Socioeconomic History  . Marital status: Single    Spouse name: Not on file  . Number of children: Not on file  . Years of education: Not on file  . Highest education level: Not on file  Occupational History  . Not on file  Tobacco Use  . Smoking status: Former Smoker    Packs/day: 0.75    Years: 40.00    Pack years: 30.00    Types: Cigarettes    Quit date: 02/06/2018    Years since quitting: 2.8  . Smokeless tobacco: Never Used  . Tobacco comment: quit 02/2018  Vaping Use  .  Vaping Use: Never used  Substance and Sexual Activity  . Alcohol use: Yes    Comment: 2-3 glasses of wine per week,none last 24hrs  . Drug use: No  . Sexual activity: Not on file  Other Topics Concern  . Not on file  Social History Narrative   Lives in [place]. Work - [employer]   Diet: Regular   Exercise: walking   Social Determinants of Radio broadcast assistant Strain: Not on [employer] Insecurity: Not on file  Transportation Needs: Not on file  Physical Activity: Not on file  Stress: Not on file  Social Connections: Not on file  Intimate Partner Violence: Not on file    Review of Systems: See HPI, otherwise negative ROS  Physical Exam: BP 115/80   Pulse 79   Temp (!) 96.5 F (35.8 C) (Temporal)   Resp 20   Ht 5' 3"  (1.6 m)   Wt 104.3 kg   SpO2 100%   BMI 40.74 kg/m  General:   Alert,  pleasant and cooperative in NAD Head:  Normocephalic and atraumatic. Neck:  Supple; no masses or thyromegaly. Lungs:  Clear throughout to auscultation, normal respiratory effort.    Heart:  +S1, +S2, Regular rate and rhythm, No edema. Abdomen:  Soft, nontender and nondistended. Normal bowel sounds, without guarding, and without rebound.   Neurologic:  Alert and  oriented x4;  grossly normal neurologically.  Impression/Plan: Linda Schmitt is here for a colonoscopy to be performed for average risk screening.  Risks, benefits, limitations, and alternatives regarding  colonoscopy have been reviewed with the patient.  Questions have been answered.  All parties agreeable.   Virgel Manifold, MD  12/10/2020, 8:31 AM

## 2020-12-10 NOTE — Anesthesia Procedure Notes (Signed)
Date/Time: 12/10/2020 8:25 AM Performed by: Doreen Salvage, CRNA Pre-anesthesia Checklist: Patient identified, Emergency Drugs available, Suction available and Patient being monitored Patient Re-evaluated:Patient Re-evaluated prior to induction Oxygen Delivery Method: Nasal cannula Induction Type: IV induction Dental Injury: Teeth and Oropharynx as per pre-operative assessment  Comments: Nasal cannula with etCO2 monitoring

## 2020-12-10 NOTE — Transfer of Care (Signed)
Immediate Anesthesia Transfer of Care Note  Patient: Linda Schmitt  Procedure(s) Performed: COLONOSCOPY WITH PROPOFOL (N/A )  Patient Location: PACU and Endoscopy Unit  Anesthesia Type:General  Level of Consciousness: confused  Airway & Oxygen Therapy: Patient Spontanous Breathing  Post-op Assessment: Report given to RN and Post -op Vital signs reviewed and stable  Post vital signs: Reviewed and stable  Last Vitals:  Vitals Value Taken Time  BP 125/95 12/10/20 0859  Temp    Pulse 79 12/10/20 0859  Resp 11 12/10/20 0859  SpO2 100 % 12/10/20 0859  Vitals shown include unvalidated device data.  Last Pain:  Vitals:   12/10/20 0858  TempSrc:   PainSc: 0-No pain         Complications: No complications documented.

## 2020-12-11 ENCOUNTER — Encounter: Payer: Self-pay | Admitting: Gastroenterology

## 2020-12-11 LAB — SURGICAL PATHOLOGY

## 2020-12-12 ENCOUNTER — Encounter: Payer: Self-pay | Admitting: Gastroenterology

## 2020-12-16 ENCOUNTER — Other Ambulatory Visit: Payer: Self-pay

## 2020-12-16 ENCOUNTER — Encounter: Payer: Self-pay | Admitting: Oncology

## 2020-12-16 ENCOUNTER — Inpatient Hospital Stay: Payer: Medicare HMO | Attending: Oncology

## 2020-12-16 ENCOUNTER — Inpatient Hospital Stay (HOSPITAL_BASED_OUTPATIENT_CLINIC_OR_DEPARTMENT_OTHER): Payer: Medicare HMO | Admitting: Oncology

## 2020-12-16 VITALS — BP 133/81 | HR 73 | Temp 97.8°F | Resp 18 | Wt 233.1 lb

## 2020-12-16 DIAGNOSIS — C5700 Malignant neoplasm of unspecified fallopian tube: Secondary | ICD-10-CM | POA: Insufficient documentation

## 2020-12-16 DIAGNOSIS — G620 Drug-induced polyneuropathy: Secondary | ICD-10-CM | POA: Diagnosis not present

## 2020-12-16 DIAGNOSIS — Z5111 Encounter for antineoplastic chemotherapy: Secondary | ICD-10-CM

## 2020-12-16 DIAGNOSIS — Z87891 Personal history of nicotine dependence: Secondary | ICD-10-CM | POA: Diagnosis not present

## 2020-12-16 DIAGNOSIS — Z79899 Other long term (current) drug therapy: Secondary | ICD-10-CM | POA: Diagnosis not present

## 2020-12-16 DIAGNOSIS — D649 Anemia, unspecified: Secondary | ICD-10-CM | POA: Insufficient documentation

## 2020-12-16 DIAGNOSIS — G629 Polyneuropathy, unspecified: Secondary | ICD-10-CM

## 2020-12-16 DIAGNOSIS — J90 Pleural effusion, not elsewhere classified: Secondary | ICD-10-CM | POA: Diagnosis not present

## 2020-12-16 LAB — CBC WITH DIFFERENTIAL/PLATELET
Abs Immature Granulocytes: 0.02 10*3/uL (ref 0.00–0.07)
Basophils Absolute: 0 10*3/uL (ref 0.0–0.1)
Basophils Relative: 0 %
Eosinophils Absolute: 0 10*3/uL (ref 0.0–0.5)
Eosinophils Relative: 1 %
HCT: 33.8 % — ABNORMAL LOW (ref 36.0–46.0)
Hemoglobin: 11 g/dL — ABNORMAL LOW (ref 12.0–15.0)
Immature Granulocytes: 0 %
Lymphocytes Relative: 29 %
Lymphs Abs: 1.4 10*3/uL (ref 0.7–4.0)
MCH: 33.1 pg (ref 26.0–34.0)
MCHC: 32.5 g/dL (ref 30.0–36.0)
MCV: 101.8 fL — ABNORMAL HIGH (ref 80.0–100.0)
Monocytes Absolute: 0.5 10*3/uL (ref 0.1–1.0)
Monocytes Relative: 10 %
Neutro Abs: 2.9 10*3/uL (ref 1.7–7.7)
Neutrophils Relative %: 60 %
Platelets: 166 10*3/uL (ref 150–400)
RBC: 3.32 MIL/uL — ABNORMAL LOW (ref 3.87–5.11)
RDW: 15.9 % — ABNORMAL HIGH (ref 11.5–15.5)
WBC: 4.9 10*3/uL (ref 4.0–10.5)
nRBC: 0 % (ref 0.0–0.2)

## 2020-12-16 LAB — COMPREHENSIVE METABOLIC PANEL
ALT: 17 U/L (ref 0–44)
AST: 20 U/L (ref 15–41)
Albumin: 4.3 g/dL (ref 3.5–5.0)
Alkaline Phosphatase: 47 U/L (ref 38–126)
Anion gap: 10 (ref 5–15)
BUN: 7 mg/dL — ABNORMAL LOW (ref 8–23)
CO2: 26 mmol/L (ref 22–32)
Calcium: 9.4 mg/dL (ref 8.9–10.3)
Chloride: 101 mmol/L (ref 98–111)
Creatinine, Ser: 0.91 mg/dL (ref 0.44–1.00)
GFR, Estimated: >60 mL/min (ref >60)
Glucose, Bld: 108 mg/dL — ABNORMAL HIGH (ref 70–99)
Potassium: 3.8 mmol/L (ref 3.5–5.1)
Sodium: 137 mmol/L (ref 135–145)
Total Bilirubin: 0.6 mg/dL (ref 0.3–1.2)
Total Protein: 7.2 g/dL (ref 6.5–8.1)

## 2020-12-16 NOTE — Progress Notes (Signed)
Hematology/Oncology Follow Up Note [facility]  [phone] [phone]  Patient Care Team: Burnard Hawthorne, FNP as PCP - General (Family Medicine) Clent Jacks, RN as Oncology Nurse Navigator   Name of the patient: Linda Schmitt  [id number]  02/04/1956   REASON FOR VISIT  follow-up for serous carcinoma  PERTINENT ONCOLOGY HISTORY Linda Schmitt is a 65 y.o.afemale who has above oncology history reviewed by me today presented for follow up visit for management of malignant pleural effusion status post thoracentesis x2 during her recent admission. Cytology was positive for metastatic carcinoma.  TTF-1 Napsin A, GATA3, CDX2 were negative. Positive for PAX 8, which is most often expressing tumors of gynecological and renal origin. PET scan was independently reviewed by me and discussed with patient.  I also discussed with radiology. Exam showed evidence of peritoneal disease within the abdomen, soft tissue infiltrating into the omentum.  Large loculated left pleural effusion with thin FDG avid rind of soft tissue overlying the left lung.  Small right pleural effusion with mild FDG uptake is equivocal for malignant effusion of the right hemithorax. Fluid density structure within the right side of the pelvis abutting the right-sided of uterus is noted.  This is indeterminate.  Further investigation with pelvic sonogram may be helpful.  Pelvic ultrasound was obtained.-Heterogeneous myometrium with poor definition of endometrial complex. Normal left ovary.  No cystic right adnexal lesion is identified.  No a probable small normal-appearing right ovary is identified.  Suspect that the low-attenuation presumed cystic structure seen in the right adnexa on the prior image corresponds to a small amount of nonspecific free fluid in the right adnexa. I discussed  with gynecology oncology Dr.Secord.  Given that cytology from left pleural effusion is positive for metastatic carcinoma, tumor cells are positive for PAX 8 which indicates GYN origin.  Given that patient has moderate to large amount of pleural effusion, heavy tumor burden, recommend to start chemotherapy as soon as possible with carboplatin AUC 5-6 and Taxol 175 mg/m every 3 weeks for 3 cycles.   #Pre-existing neuropathy of left hand, she takes gabapentin. # seen by Dr. Theora Gianotti on 04/30/2020 and she recommends to proceed with the fourth cycle of chemotherapy and patient will be seen by Dr. Theora Gianotti again on 05/27/2020 for debulking surgery plan on 06/05/2020 with cardiothoracic surgery for thoracoscopy possible thoracic tumor debulking.  Patient was recommended to have to repeat CT of the chest after her chemotherapy.  Oncologic chemotherapy treatments 03/04/2020- 9/28/201 4 cycles of neoadjuvant carbo and Taxol  06/05/2020, status post Total hysterectomy and bilateral salpingo-oophorectomy, Omentectomy, and Optimal (R<1) interval tumor debulking. A. Peritoneal nodule, excisional biopsy: Fibroadipose tissue with calcifications, negative for tumor. B. Uterus, bilateral ovaries and fallopian tubes, hysterectomy and bilateral salpingo-oophorectomy: Cervix: Squamous metaplasia. Endometrium: Inactive endometrium. Myometrium: Adenomyosis. Serosa: High grade serous adenocarcinoma. Right ovary: High grade serous adenocarcinoma, involving the ovarian surface and parenchyma. Right fallopian tube: No specific pathologic change. Left ovary: High grade serous adenocarcinoma, involving the ovarian surface and parenchyma. Left fallopian tube: High grade serous adenocarcinoma, involving tubal fimbria. Serous tubal intraepithelial carcinoma. Immunohistochemistry was performed on block B14 at [facility] to characterize the pathologic process and demonstrates the following immunophenotype in the cells of  interest:POSITIVE:  p53, p16 (strong, patchy), Ki67 (elevated) This staining profile supports the above diagnosis. C. Peritoneal nodule, left pericolic gutter, excisional biopsy: High grade serous adenocarcinoma. D. Cul-de-sac, excisional biopsy: High grade serous adenocarcinoma. E. Omentum, excision: High grade serous  adenocarcinoma (up to 3.0 cm).  TUMOR  Tumor Site:  Left fallopian tube  Histologic Type:  Serous carcinoma  Histologic Grade:  High grade  Tumor Size:  Greatest Dimension (Centimeters): 0.2 cm  Ovarian Surface Involvement:  Present   Laterality:  Bilateral  Fallopian Tube Surface Involvement:  Present   Laterality:  Left  Implants:  Present (sites): left peritoneum, cul-de-sac, omentum  Other Tissue / Organ Involvement:  Right ovary  Other Tissue / Organ Involvement:  Left ovary  Other Tissue / Organ Involvement:  Left fallopian tube  Other Tissue / Organ Involvement:  Pelvic peritoneum  Other Tissue / Organ Involvement:  Omentum  Largest Extrapelvic Peritoneal Focus:  Macroscopic (greater than 2 cm)  Peritoneal / Ascitic Fluid:  Not submitted / unknown  Pleural Fluid:  Not submitted / unknown  Treatment Effect:  Moderate response identified (CRS 2)   LYMPH NODES  Regional Lymph Nodes:  No lymph nodes submitted or found   PATHOLOGIC STAGE CLASSIFICATION (pTNM, AJCC 8th Edition)  TNM Descriptors:  y (post-treatment)  Primary Tumor (pT):  pT3c  Regional Lymph Nodes (pN):  pNX FIGO Stage:  IIIC   + pleural effusion so STAGE IV No germline BRCA mutation, -Homologous recombination deficiency positive.  Myriad Genomic instability score:  positive, somatic BRCA1 positive  07/01/20- 08/12/2020 3 cycles of adjuvant chemotherapy with carboplatin AUC 5, Taxol 175 mg/m. 09/02/2020, started on olaparib 300 mg twice daily.  March 2022 followed up with gyn and  At that time patient had some suprapubic  pressure and pain of unclear etiology.  A CT chest abdomen pelvis was obtained to evaluate further. 11/14/2020 CT chest abdomen pelvis showed no residual suspicious peritoneal nodularity identified.  Unchanged small nodules adjacent to the spleen which are stable since 2017.  Interval resolution of previously seen moderate left pleural effusion.  Minimal residual pleural thickening.    INTERVAL HISTORY 65 year old female presents for follow-up of stage IV ovarian cancer,  somatic BRCA1 positive and HRD positive.  Patient has been on olaparib since January 2022.   Overall she tolerates well.  No new complaints. Chronic neuropathy, stable symptoms.  Takes gabapentin and nortriptyline  Review of Systems  Constitutional: Negative for appetite change, chills, fatigue and fever.  HENT:   Negative for hearing loss and voice change.   Eyes: Negative for eye problems.  Respiratory: Negative for chest tightness and cough.   Cardiovascular: Negative for chest pain.  Gastrointestinal: Negative for abdominal distention, abdominal pain and blood in stool.  Endocrine: Negative for hot flashes.  Genitourinary: Negative for difficulty urinating and frequency.   Musculoskeletal: Negative for arthralgias.  Skin: Negative for itching and rash.  Neurological: Positive for numbness. Negative for extremity weakness.  Hematological: Negative for adenopathy.  Psychiatric/Behavioral: Negative for confusion.        No Known Allergies   Past Medical History:  Diagnosis Date  . Cancer ([place])    ovarian cancer  . Family history of breast cancer   . Genetic testing 04/09/2020   No pathogenic variants identified on the Myriad MyRisk (germline) panel. The report date is 04/07/2020.   The [facility] gene panel offered by [employer] includes sequencing and deletion/duplication testing of the following 35 genes: APC, ATM, AXIN2, BARD1, BMPR1A, BRCA1, BRCA2, BRIP1, CHD1, CDK4, CDKN2A, CHEK2, EPCAM (large  rearrangement only), HOXB13, GALNT12, MLH1, MSH2, MSH3, MSH6  . HLD (hyperlipidemia)      Past Surgical History:  Procedure Laterality Date  . CARPAL TUNNEL RELEASE Left   .  COLONOSCOPY WITH PROPOFOL N/A 12/10/2020   Procedure: COLONOSCOPY WITH PROPOFOL;  Surgeon: Virgel Manifold, MD;  Location: [facility] ENDOSCOPY;  Service: Endoscopy;  Laterality: N/A;  . ROBOTIC ASSISTED TOTAL HYSTERECTOMY WITH BILATERAL SALPINGO OOPHERECTOMY  06/05/2020   with omentectomy tumor debulking via mini-laparotomy/hand assisted port  . SHOULDER ARTHROSCOPY WITH SUBACROMIAL DECOMPRESSION AND OPEN ROTATOR C Right 02/01/2020   Procedure: RIGHT SHOULDER ARTHROSCOPIC SUBSCAPULARIS REPAIR, ROTATOR CUFF REPAIR, SUABCROMIAL DECOMPRESSION, AND BICEPS TENODESIS.;  Surgeon: Leim Fabry, MD;  Location: [facility] ORS;  Service: Orthopedics;  Laterality: Right;  . TUBAL LIGATION      Social History   Socioeconomic History  . Marital status: Single    Spouse name: Not on file  . Number of children: Not on file  . Years of education: Not on file  . Highest education level: Not on file  Occupational History  . Not on file  Tobacco Use  . Smoking status: Former Smoker    Packs/day: 0.75    Years: 40.00    Pack years: 30.00    Types: Cigarettes    Quit date: 02/06/2018    Years since quitting: 2.8  . Smokeless tobacco: Never Used  . Tobacco comment: quit 02/2018  Vaping Use  . Vaping Use: Never used  Substance and Sexual Activity  . Alcohol use: Yes    Comment: 2-3 glasses of wine per week,none last 24hrs  . Drug use: No  . Sexual activity: Not on file  Other Topics Concern  . Not on file  Social History Narrative   Lives in [place] alone. Work - [employer]   Diet: Regular   Exercise: walking   Social Determinants of Radio broadcast assistant Strain: Not on [employer] Insecurity: Not on file  Transportation Needs: Not on file  Physical Activity: Not on file  Stress: Not on file  Social Connections: Not  on file  Intimate Partner Violence: Not on file    Family History  Problem Relation Age of Onset  . Cancer Mother        lymphoma  . Stroke Sister   . Cancer Brother        lung  . Cancer Sister        lung  . Lung cancer Brother   . Bone cancer Sister   . Breast cancer Cousin 87       maternal  . Cancer Maternal Aunt        unk types     Current Outpatient Medications:  .  acetaminophen (TYLENOL) 500 MG tablet, Take 2 tablets (1,000 mg total) by mouth every 8 (eight) hours., Disp: 90 tablet, Rfl: 2 .  cholecalciferol (VITAMIN D3) 10 MCG (400 UNIT) TABS tablet, Take 800 Units by mouth daily., Disp: , Rfl:  .  cyanocobalamin 1000 MCG tablet, Take 400 mcg by mouth daily., Disp: , Rfl:  .  gabapentin (NEURONTIN) 300 MG capsule, Take 1 capsule (300 mg total) by mouth 2 (two) times daily. (Patient taking differently: Take 300 mg by mouth 2 (two) times daily. 1 QAM 2 QHS, 1 tab mid day prn), Disp: 60 capsule, Rfl: 2 .  meloxicam (MOBIC) 15 MG tablet, Take 1 tablet by mouth daily., Disp: , Rfl:  .  nortriptyline (PAMELOR) 10 MG capsule, TAKE 1 CAPSULE BY MOUTH AT BEDTIME., Disp: 30 capsule, Rfl: 0 .  olaparib (LYNPARZA) 150 MG tablet, Take 2 tablets (300 mg total) by mouth 2 (two) times daily. Swallow whole. May take with food to decrease  nausea and vomiting., Disp: 120 tablet, Rfl: 2 .  potassium chloride SA (KLOR-CON) 20 MEQ tablet, Take 1 tablet (20 mEq total) by mouth daily., Disp: 2 tablet, Rfl: 0 .  pravastatin (PRAVACHOL) 40 MG tablet, TAKE 1 TABLET(40 MG) BY MOUTH DAILY, Disp: 90 tablet, Rfl: 0 .  Na Sulfate-K Sulfate-Mg Sulf 17.5-3.13-1.6 GM/177ML SOLN, At 5 PM the day before procedure take 1 bottle and 5 hours before procedure take 1 bottle. (Patient not taking: Reported on 12/16/2020), Disp: 354 mL, Rfl: 0  Physical exam:  Vitals:   12/16/20 1059  BP: 133/81  Pulse: 73  Resp: 18  Temp: 97.8 F (36.6 C)  Weight: 233 lb 1.6 oz (105.7 kg)   Physical Exam Constitutional:       General: She is not in acute distress.    Appearance: She is not diaphoretic.  HENT:     Head: Normocephalic and atraumatic.     Nose: Nose normal.     Mouth/Throat:     Pharynx: No oropharyngeal exudate.  Eyes:     General: No scleral icterus.    Pupils: Pupils are equal, round, and reactive to light.  Cardiovascular:     Rate and Rhythm: Normal rate and regular rhythm.     Heart sounds: No murmur heard.   Pulmonary:     Effort: Pulmonary effort is normal. No respiratory distress.     Breath sounds: No rales.  Chest:     Chest wall: No tenderness.  Abdominal:     General: There is no distension.     Palpations: Abdomen is soft.     Tenderness: There is no abdominal tenderness.  Musculoskeletal:        General: Normal range of motion.     Cervical back: Normal range of motion and neck supple.  Skin:    General: Skin is warm and dry.     Findings: No erythema.  Neurological:     Mental Status: She is alert and oriented to person, place, and time.     Cranial Nerves: No cranial nerve deficit.     Motor: No abnormal muscle tone.     Coordination: Coordination normal.  Psychiatric:        Mood and Affect: Affect normal.          CMP Latest Ref Rng & Units 12/16/2020  Glucose 70 - 99 mg/dL 108(H)  BUN 8 - 23 mg/dL 7(L)  Creatinine 0.44 - 1.00 mg/dL 0.91  Sodium 135 - 145 mmol/L 137  Potassium 3.5 - 5.1 mmol/L 3.8  Chloride 98 - 111 mmol/L 101  CO2 22 - 32 mmol/L 26  Calcium 8.9 - 10.3 mg/dL 9.4  Total Protein 6.5 - 8.1 g/dL 7.2  Total Bilirubin 0.3 - 1.2 mg/dL 0.6  Alkaline Phos 38 - 126 U/L 47  AST 15 - 41 U/L 20  ALT 0 - 44 U/L 17   CBC Latest Ref Rng & Units 12/16/2020  WBC 4.0 - 10.5 K/uL 4.9  Hemoglobin 12.0 - 15.0 g/dL 11.0(L)  Hematocrit 36.0 - 46.0 % 33.8(L)  Platelets 150 - 400 K/uL 166    RADIOGRAPHIC STUDIES: I have personally reviewed the radiological images as listed and agreed with the findings in the report. CT CHEST ABDOMEN PELVIS W  CONTRAST  Result Date: 11/16/2020 CLINICAL DATA:  Abdominal pain, metastatic fallopian tube cancer, status post hysterectomy and bilateral salpingectomy and chemotherapy EXAM: CT CHEST, ABDOMEN, AND PELVIS WITH CONTRAST TECHNIQUE: Multidetector CT imaging of the chest, abdomen and  pelvis was performed following the standard protocol during bolus administration of intravenous contrast. CONTRAST:  173mL OMNIPAQUE IOHEXOL 300 MG/ML SOLN, additional oral enteric contrast COMPARISON:  CT chest abdomen pelvis, 04/24/2020, CT chest, 05/23/2016 FINDINGS: CT CHEST FINDINGS Cardiovascular: No significant vascular findings. Normal heart size. No pericardial effusion. Mediastinum/Nodes: No enlarged mediastinal, hilar, or axillary lymph nodes. Thyroid gland, trachea, and esophagus demonstrate no significant findings. Lungs/Pleura: Interval resolution of a previously seen moderate left pleural effusion. There is minimal residual pleural thickening (series 2, image 43). Musculoskeletal: No chest wall mass or suspicious bone lesions identified. CT ABDOMEN PELVIS FINDINGS Hepatobiliary: No solid liver abnormality is seen. No gallstones, gallbladder wall thickening, or biliary dilatation. Pancreas: Unremarkable. No pancreatic ductal dilatation or surrounding inflammatory changes. Spleen: Normal in size without significant abnormality. Adrenals/Urinary Tract: Stable, benign adenoma of the lateral limb of the left adrenal gland, seen on examinations dating back to 2017. Kidneys are normal, without renal calculi, solid lesion, or hydronephrosis. Bladder is unremarkable. Stomach/Bowel: Stomach is within normal limits. Appendix appears normal. No evidence of bowel wall thickening, distention, or inflammatory changes. Vascular/Lymphatic: Scattered aortic atherosclerosis. No enlarged abdominal or pelvic lymph nodes. Reproductive: Status post interval hysterectomy and bilateral salpingectomy. Other: No abdominal wall hernia or abnormality.  No abdominopelvic ascites. Interval omentectomy. Unchanged small nodules adjacent to the spleen which are stable compared to examinations dating back to 2017 and likely small splenules and/or lymph nodes (series 2, image 61). Musculoskeletal: No acute or significant osseous findings. IMPRESSION: 1. Status post interval hysterectomy and bilateral salpingectomy. 2. Status post interval omentectomy. There is no residual suspicious peritoneal nodularity identified. Unchanged small nodules adjacent to the spleen which are stable compared to examinations dating back to 2017 and likely small splenules and/or lymph nodes. Attention on follow-up 3. Interval resolution of a previously seen moderate left pleural effusion. There is minimal residual pleural thickening. 4. Findings are consistent with treatment response. 5. No evidence of new metastatic disease in the chest, abdomen, or pelvis. Aortic Atherosclerosis (ICD10-I70.0). Electronically Signed   By: Eddie Candle M.D.   On: 11/16/2020 18:26   MM 3D SCREEN BREAST BILATERAL  Result Date: 11/06/2020 CLINICAL DATA:  Screening. EXAM: DIGITAL SCREENING BILATERAL MAMMOGRAM WITH TOMOSYNTHESIS AND CAD TECHNIQUE: Bilateral screening digital craniocaudal and mediolateral oblique mammograms were obtained. Bilateral screening digital breast tomosynthesis was performed. The images were evaluated with computer-aided detection. COMPARISON:  Previous exam(s). ACR Breast Density Category b: There are scattered areas of fibroglandular density. FINDINGS: There are no findings suspicious for malignancy. The images were evaluated with computer-aided detection. IMPRESSION: No mammographic evidence of malignancy. A result letter of this screening mammogram will be mailed directly to the patient. RECOMMENDATION: Screening mammogram in one year. (Code:SM-B-01Y) BI-RADS CATEGORY  1: Negative. Electronically Signed   By: Lillia Mountain M.D.   On: 11/06/2020 12:39     Assessment and  plan Patient is a 65 y.o. female presents for follow-up for FIGO IIIC ovarian cancer, ( somatic BRCA1 positive, HRD positive). 1. Carcinoma of fallopian tube, unspecified laterality ([place])   2. Encounter for antineoplastic chemotherapy   3. Neuropathy    #FIGO IVA Fallopian tube serous carcinoma- + pleural effusion cytology S/p 4 cycles of neoadjuvant chemotherapy  Carboplatin /Taxol  S/p total hysterectomy with salpingo-oophorectomy and omentectomy. Optimal (R<1) interval tumor debulking. S/p 3 cycles of adjuvant carbopltain  Taxol  # HRD, patient is clinically doing well.  CA-125 is being followed, today's level is pending. Labs reviewed and discussed with patient Continue olaparib 300  mg twice daily   #chemotherapy induced neuropathy, gabapentin 600 mg twice daily plus another 300 mg midday as needed.  Continue nortriptyline 10mg  QHS.  Continue current regimen.  Symptoms are stable. She has been referred previously to acupuncture clinic. Marland Kitchen #Anemia, likely secondary to recent surgery.  Hemoglobin is 11.  Macrocytosis likely due to olaparib.  Continue monitor..   We spent sufficient time to discuss many aspect of care, questions were answered to patient's satisfaction.  Follow-up in 5 weeks   Earlie Server, MD, PhD Hematology Oncology [facility] at [facility] Pager- [phone] 12/16/2020

## 2020-12-16 NOTE — Progress Notes (Signed)
Pt here for follow up. No new concerns voiced.   

## 2020-12-17 LAB — CA 125: Cancer Antigen (CA) 125: 4.7 U/mL (ref 0.0–38.1)

## 2020-12-25 ENCOUNTER — Other Ambulatory Visit: Payer: Self-pay | Admitting: Family

## 2020-12-25 DIAGNOSIS — E785 Hyperlipidemia, unspecified: Secondary | ICD-10-CM

## 2020-12-26 ENCOUNTER — Other Ambulatory Visit: Payer: Self-pay | Admitting: Pharmacist

## 2020-12-26 DIAGNOSIS — C5700 Malignant neoplasm of unspecified fallopian tube: Secondary | ICD-10-CM

## 2020-12-26 MED ORDER — OLAPARIB 150 MG PO TABS: 300.0000 mg | ORAL_TABLET | Freq: Two times a day (BID) | ORAL | 2 refills | Status: DC

## 2020-12-30 ENCOUNTER — Ambulatory Visit: Payer: Medicare HMO | Admitting: Gastroenterology

## 2021-01-09 ENCOUNTER — Encounter: Payer: Self-pay | Admitting: Oncology

## 2021-01-09 ENCOUNTER — Telehealth: Payer: Self-pay | Admitting: Pharmacist

## 2021-01-09 ENCOUNTER — Other Ambulatory Visit (HOSPITAL_COMMUNITY): Payer: Self-pay

## 2021-01-09 NOTE — Telephone Encounter (Signed)
Oral Chemotherapy Pharmacist Encounter   Linda Schmitt called this morning to let me know when she opened her new package of medication from her manufacturer assistance program that the package was sealed but the medication bottle they sent her was opened and empty.   She called the assistance program to let them know. They will sent her another shipment but it will not be delivered until sometime next week. She is currently out of medication.  We have a bottle of Lynparza 100mg  to provide to Linda Schmitt. He normal dose is 300mg  twice daily. She knows she will have to take three 100mg  tablets twice daily. She will switch back to her normal 150mg  tablets when the medication arrives from the manufacturer.   Darl Pikes, PharmD, BCPS, BCOP, CPP Hematology/Oncology Clinical Pharmacist [facility] Oral [facility] [phone]  01/09/2021 2:49 PM

## 2021-01-17 IMAGING — PT NM PET TUM IMG INITIAL (PI) SKULL BASE T - THIGH
1 of 10 series · 1 of 25 positions shown · non-contrast
Comparison: None.

CLINICAL DATA: Initial treatment strategy for malignant pleural
effusion.

EXAM:
NUCLEAR MEDICINE PET SKULL BASE TO THIGH
TECHNIQUE: 12.83 mCi F-18 FDG was injected intravenously. Full-ring PET imaging
was performed from the skull base to thigh after the radiotracer. CT
data was obtained and used for attenuation correction and anatomic
localization.
Fasting blood glucose: 91 mg/dl

[Series 3: ct wb 5.0 b30f · axial · 5.0mm · 0.98mm/px · 1 of 290 slices shown]
[im 290/290  brain]
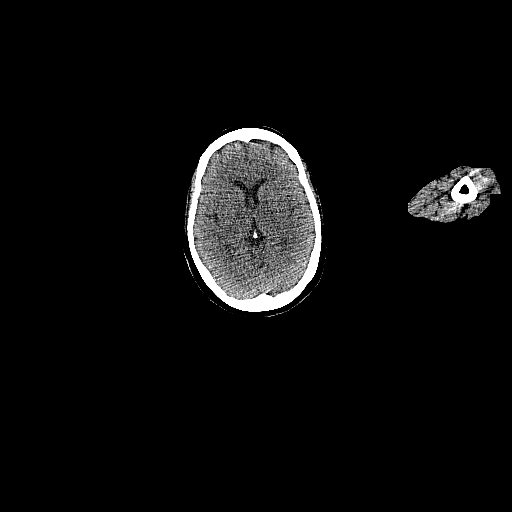

[1 of 25 positions shown; findings below may reference images not displayed]

FINDINGS: Mediastinal blood pool activity: SUV max

Liver activity: SUV max NA

NECK: There is symmetric radiotracer uptake noted within the base of
tongue and bilateral tonsillar regions with SUV max of 7.49. No FDG
avid lymph nodes identified within the soft tissues of the neck.

Incidental CT findings: none

CHEST: No FDG avid axillary or supraclavicular lymph nodes. No FDG
avid mediastinal or hilar lymph nodes.

Known malignant, loculated left pleural effusion is noted with
overlying compressive type atelectasis. This has decreased in volume
when compared with pre thoracentesis chest CT dated 02/17/2020.
There is a thin rind of mildly FDG avid soft tissue overlying the
left lung compatible with malignant pleural effusion. The
corresponding SUV max ranges between 3.25 and 2.83.

There is a trace amount of right pleural fluid which also exhibits
mild FDG uptake within SUV max of 3.15, image 93/3. No suspicious or
FDG avid pulmonary nodule or mass identified.

Incidental CT findings: Mild aortic atherosclerosis.

ABDOMEN/PELVIS: There is no abnormal FDG uptake within the liver,
pancreas, spleen, or adrenal glands. Left adrenal nodule measures
1.4 cm and does not exhibit any significant tracer uptake favoring a
benign adenoma.

No enlarged or FDG avid retroperitoneal or pelvic lymph nodes. FDG
avid soft tissue infiltration of the omentum is identified
compatible with peritoneal carcinomatosis. Focal area of increased
omental soft tissue along the midline of the abdomen measures 4.7 x
2.2 cm and has an SUV max of 7.27. Additional small peritoneal
nodules are noted scattered within the upper abdomen including on
image 169/3 and image 153/3. No ascites identified. Within the right
pelvis abutting the right side of the uterus is a fluid density
structure which measures 4.3 x 2.7 cm within SUV max of 4.20.

Incidental CT findings: Aortic atherosclerosis.

SKELETON: No focal hypermetabolic activity to suggest skeletal
metastasis.

Incidental CT findings: none
IMPRESSION: 1. Exam shows evidence of peritoneal disease within the abdomen
including FDG avid soft tissue infiltration into the omentum.
2. Large, loculated left pleural effusion with thin, FDG avid rind
of soft tissue overlying the left lung. This corresponds to known,
pathology proven malignant pleural effusion.
3. Small right pleural effusion with mild FDG uptake is equivocal
for malignant effusion of the right hemithorax.
4. Fluid density structure within right side of pelvis abutting the
right-side of the uterus is noted. This is indeterminate. Further
investigation with pelvic sonogram may be helpful.

## 2021-01-20 ENCOUNTER — Inpatient Hospital Stay: Payer: Medicare HMO | Attending: Oncology

## 2021-01-20 ENCOUNTER — Other Ambulatory Visit: Payer: Self-pay

## 2021-01-20 DIAGNOSIS — F32A Depression, unspecified: Secondary | ICD-10-CM | POA: Diagnosis not present

## 2021-01-20 DIAGNOSIS — G620 Drug-induced polyneuropathy: Secondary | ICD-10-CM | POA: Insufficient documentation

## 2021-01-20 DIAGNOSIS — Z90710 Acquired absence of both cervix and uterus: Secondary | ICD-10-CM | POA: Diagnosis not present

## 2021-01-20 DIAGNOSIS — Z1501 Genetic susceptibility to malignant neoplasm of breast: Secondary | ICD-10-CM | POA: Insufficient documentation

## 2021-01-20 DIAGNOSIS — I700 Atherosclerosis of aorta: Secondary | ICD-10-CM | POA: Diagnosis not present

## 2021-01-20 DIAGNOSIS — Z1502 Genetic susceptibility to malignant neoplasm of ovary: Secondary | ICD-10-CM | POA: Diagnosis not present

## 2021-01-20 DIAGNOSIS — Z79899 Other long term (current) drug therapy: Secondary | ICD-10-CM | POA: Diagnosis not present

## 2021-01-20 DIAGNOSIS — C786 Secondary malignant neoplasm of retroperitoneum and peritoneum: Secondary | ICD-10-CM | POA: Insufficient documentation

## 2021-01-20 DIAGNOSIS — Z1509 Genetic susceptibility to other malignant neoplasm: Secondary | ICD-10-CM | POA: Insufficient documentation

## 2021-01-20 DIAGNOSIS — N760 Acute vaginitis: Secondary | ICD-10-CM | POA: Diagnosis not present

## 2021-01-20 DIAGNOSIS — Z90722 Acquired absence of ovaries, bilateral: Secondary | ICD-10-CM | POA: Diagnosis not present

## 2021-01-20 DIAGNOSIS — K5900 Constipation, unspecified: Secondary | ICD-10-CM | POA: Diagnosis not present

## 2021-01-20 DIAGNOSIS — D649 Anemia, unspecified: Secondary | ICD-10-CM | POA: Diagnosis not present

## 2021-01-20 DIAGNOSIS — J910 Malignant pleural effusion: Secondary | ICD-10-CM | POA: Insufficient documentation

## 2021-01-20 DIAGNOSIS — Z9221 Personal history of antineoplastic chemotherapy: Secondary | ICD-10-CM | POA: Diagnosis not present

## 2021-01-20 DIAGNOSIS — C5700 Malignant neoplasm of unspecified fallopian tube: Secondary | ICD-10-CM | POA: Insufficient documentation

## 2021-01-20 DIAGNOSIS — Z791 Long term (current) use of non-steroidal anti-inflammatories (NSAID): Secondary | ICD-10-CM | POA: Insufficient documentation

## 2021-01-20 DIAGNOSIS — C763 Malignant neoplasm of pelvis: Secondary | ICD-10-CM

## 2021-01-20 LAB — COMPREHENSIVE METABOLIC PANEL
ALT: 16 U/L (ref 0–44)
AST: 20 U/L (ref 15–41)
Albumin: 4.2 g/dL (ref 3.5–5.0)
Alkaline Phosphatase: 41 U/L (ref 38–126)
Anion gap: 11 (ref 5–15)
BUN: 10 mg/dL (ref 8–23)
CO2: 24 mmol/L (ref 22–32)
Calcium: 9.1 mg/dL (ref 8.9–10.3)
Chloride: 103 mmol/L (ref 98–111)
Creatinine, Ser: 0.93 mg/dL (ref 0.44–1.00)
GFR, Estimated: >60 mL/min (ref >60)
Glucose, Bld: 131 mg/dL — ABNORMAL HIGH (ref 70–99)
Potassium: 3.3 mmol/L — ABNORMAL LOW (ref 3.5–5.1)
Sodium: 138 mmol/L (ref 135–145)
Total Bilirubin: 0.7 mg/dL (ref 0.3–1.2)
Total Protein: 7.1 g/dL (ref 6.5–8.1)

## 2021-01-20 LAB — CBC WITH DIFFERENTIAL/PLATELET
Abs Immature Granulocytes: 0.02 10*3/uL (ref 0.00–0.07)
Basophils Absolute: 0 10*3/uL (ref 0.0–0.1)
Basophils Relative: 1 %
Eosinophils Absolute: 0.1 10*3/uL (ref 0.0–0.5)
Eosinophils Relative: 1 %
HCT: 31.4 % — ABNORMAL LOW (ref 36.0–46.0)
Hemoglobin: 10.5 g/dL — ABNORMAL LOW (ref 12.0–15.0)
Immature Granulocytes: 1 %
Lymphocytes Relative: 30 %
Lymphs Abs: 1.3 10*3/uL (ref 0.7–4.0)
MCH: 34.3 pg — ABNORMAL HIGH (ref 26.0–34.0)
MCHC: 33.4 g/dL (ref 30.0–36.0)
MCV: 102.6 fL — ABNORMAL HIGH (ref 80.0–100.0)
Monocytes Absolute: 0.3 10*3/uL (ref 0.1–1.0)
Monocytes Relative: 7 %
Neutro Abs: 2.7 10*3/uL (ref 1.7–7.7)
Neutrophils Relative %: 60 %
Platelets: 161 10*3/uL (ref 150–400)
RBC: 3.06 MIL/uL — ABNORMAL LOW (ref 3.87–5.11)
RDW: 15.9 % — ABNORMAL HIGH (ref 11.5–15.5)
WBC: 4.4 10*3/uL (ref 4.0–10.5)
nRBC: 0 % (ref 0.0–0.2)

## 2021-01-21 ENCOUNTER — Other Ambulatory Visit: Payer: Self-pay

## 2021-01-21 ENCOUNTER — Inpatient Hospital Stay (HOSPITAL_BASED_OUTPATIENT_CLINIC_OR_DEPARTMENT_OTHER): Payer: Medicare HMO | Admitting: Oncology

## 2021-01-21 ENCOUNTER — Encounter: Payer: Self-pay | Admitting: Oncology

## 2021-01-21 VITALS — BP 121/72 | HR 74 | Temp 95.7°F | Resp 17 | Ht 63.0 in | Wt 227.1 lb

## 2021-01-21 DIAGNOSIS — Z5111 Encounter for antineoplastic chemotherapy: Secondary | ICD-10-CM

## 2021-01-21 DIAGNOSIS — C5700 Malignant neoplasm of unspecified fallopian tube: Secondary | ICD-10-CM | POA: Diagnosis not present

## 2021-01-21 DIAGNOSIS — K5900 Constipation, unspecified: Secondary | ICD-10-CM | POA: Diagnosis not present

## 2021-01-21 DIAGNOSIS — N898 Other specified noninflammatory disorders of vagina: Secondary | ICD-10-CM | POA: Diagnosis not present

## 2021-01-21 DIAGNOSIS — Z90710 Acquired absence of both cervix and uterus: Secondary | ICD-10-CM | POA: Diagnosis not present

## 2021-01-21 DIAGNOSIS — J910 Malignant pleural effusion: Secondary | ICD-10-CM | POA: Diagnosis not present

## 2021-01-21 DIAGNOSIS — N760 Acute vaginitis: Secondary | ICD-10-CM | POA: Diagnosis not present

## 2021-01-21 DIAGNOSIS — Z90722 Acquired absence of ovaries, bilateral: Secondary | ICD-10-CM | POA: Diagnosis not present

## 2021-01-21 DIAGNOSIS — C786 Secondary malignant neoplasm of retroperitoneum and peritoneum: Secondary | ICD-10-CM | POA: Diagnosis not present

## 2021-01-21 DIAGNOSIS — G629 Polyneuropathy, unspecified: Secondary | ICD-10-CM | POA: Diagnosis not present

## 2021-01-21 DIAGNOSIS — D649 Anemia, unspecified: Secondary | ICD-10-CM | POA: Diagnosis not present

## 2021-01-21 DIAGNOSIS — Z9221 Personal history of antineoplastic chemotherapy: Secondary | ICD-10-CM | POA: Diagnosis not present

## 2021-01-21 DIAGNOSIS — G620 Drug-induced polyneuropathy: Secondary | ICD-10-CM | POA: Diagnosis not present

## 2021-01-21 LAB — CA 125: Cancer Antigen (CA) 125: 3.4 U/mL (ref 0.0–38.1)

## 2021-01-21 MED ORDER — POTASSIUM CHLORIDE CRYS ER 20 MEQ PO TBCR
20.0000 meq | EXTENDED_RELEASE_TABLET | Freq: Every day | ORAL | 0 refills | Status: DC
Start: 2021-01-21 — End: 2021-02-18

## 2021-01-21 MED ORDER — CLOTRIMAZOLE 1 % VA CREA: 1.0000 | TOPICAL_CREAM | Freq: Every day | VAGINAL | 0 refills | Status: DC

## 2021-01-21 NOTE — Progress Notes (Signed)
Hematology/Oncology Follow Up Note [facility]  [phone] [phone]  Patient Care Team: Burnard Hawthorne, FNP as PCP - General (Family Medicine) Clent Jacks, RN as Oncology Nurse Navigator   Name of the patient: Linda Schmitt  [id number]  02/04/1956   REASON FOR VISIT  follow-up for serous carcinoma  PERTINENT ONCOLOGY HISTORY Linda Schmitt is a 65 y.o.afemale who has above oncology history reviewed by me today presented for follow up visit for management of malignant pleural effusion status post thoracentesis x2 during her recent admission. Cytology was positive for metastatic carcinoma.  TTF-1 Napsin A, GATA3, CDX2 were negative. Positive for PAX 8, which is most often expressing tumors of gynecological and renal origin. PET scan was independently reviewed by me and discussed with patient.  I also discussed with radiology. Exam showed evidence of peritoneal disease within the abdomen, soft tissue infiltrating into the omentum.  Large loculated left pleural effusion with thin FDG avid rind of soft tissue overlying the left lung.  Small right pleural effusion with mild FDG uptake is equivocal for malignant effusion of the right hemithorax. Fluid density structure within the right side of the pelvis abutting the right-sided of uterus is noted.  This is indeterminate.  Further investigation with pelvic sonogram may be helpful.  Pelvic ultrasound was obtained.-Heterogeneous myometrium with poor definition of endometrial complex. Normal left ovary.  No cystic right adnexal lesion is identified.  No a probable small normal-appearing right ovary is identified.  Suspect that the low-attenuation presumed cystic structure seen in the right adnexa on the prior image corresponds to a small amount of nonspecific free fluid in the right adnexa. I discussed  with gynecology oncology Dr.Secord.  Given that cytology from left pleural effusion is positive for metastatic carcinoma, tumor cells are positive for PAX 8 which indicates GYN origin.  Given that patient has moderate to large amount of pleural effusion, heavy tumor burden, recommend to start chemotherapy as soon as possible with carboplatin AUC 5-6 and Taxol 175 mg/m every 3 weeks for 3 cycles.   #Pre-existing neuropathy of left hand, she takes gabapentin. # seen by Dr. Theora Gianotti on 04/30/2020 and she recommends to proceed with the fourth cycle of chemotherapy and patient will be seen by Dr. Theora Gianotti again on 05/27/2020 for debulking surgery plan on 06/05/2020 with cardiothoracic surgery for thoracoscopy possible thoracic tumor debulking.  Patient was recommended to have to repeat CT of the chest after her chemotherapy.  Oncologic chemotherapy treatments 03/04/2020- 9/28/201 4 cycles of neoadjuvant carbo and Taxol  06/05/2020, status post Total hysterectomy and bilateral salpingo-oophorectomy, Omentectomy, and Optimal (R<1) interval tumor debulking. A. Peritoneal nodule, excisional biopsy: Fibroadipose tissue with calcifications, negative for tumor. B. Uterus, bilateral ovaries and fallopian tubes, hysterectomy and bilateral salpingo-oophorectomy: Cervix: Squamous metaplasia. Endometrium: Inactive endometrium. Myometrium: Adenomyosis. Serosa: High grade serous adenocarcinoma. Right ovary: High grade serous adenocarcinoma, involving the ovarian surface and parenchyma. Right fallopian tube: No specific pathologic change. Left ovary: High grade serous adenocarcinoma, involving the ovarian surface and parenchyma. Left fallopian tube: High grade serous adenocarcinoma, involving tubal fimbria. Serous tubal intraepithelial carcinoma. Immunohistochemistry was performed on block B14 at [facility] to characterize the pathologic process and demonstrates the following immunophenotype in the cells of  interest:POSITIVE:  p53, p16 (strong, patchy), Ki67 (elevated) This staining profile supports the above diagnosis. C. Peritoneal nodule, left pericolic gutter, excisional biopsy: High grade serous adenocarcinoma. D. Cul-de-sac, excisional biopsy: High grade serous adenocarcinoma. E. Omentum, excision: High grade serous  adenocarcinoma (up to 3.0 cm).  TUMOR   Tumor Site:    Left fallopian tube   Histologic Type:    Serous carcinoma   Histologic Grade:    High grade   Tumor Size:    Greatest Dimension (Centimeters): 0.2 cm   Ovarian Surface Involvement:    Present     Laterality:    Bilateral   Fallopian Tube Surface Involvement:    Present     Laterality:    Left   Implants:    Present (sites): left peritoneum, cul-de-sac, omentum   Other Tissue / Organ Involvement:    Right ovary   Other Tissue / Organ Involvement:    Left ovary   Other Tissue / Organ Involvement:    Left fallopian tube   Other Tissue / Organ Involvement:    Pelvic peritoneum   Other Tissue / Organ Involvement:    Omentum   Largest Extrapelvic Peritoneal Focus:    Macroscopic (greater than 2 cm)   Peritoneal / Ascitic Fluid:    Not submitted / unknown   Pleural Fluid:    Not submitted / unknown   Treatment Effect:    Moderate response identified (CRS 2)   LYMPH NODES   Regional Lymph Nodes:    No lymph nodes submitted or found   PATHOLOGIC STAGE CLASSIFICATION (pTNM, AJCC 8th Edition)   TNM Descriptors:    y (post-treatment)   Primary Tumor (pT):    pT3c   Regional Lymph Nodes (pN):    pNX  FIGO Stage:    IIIC   + pleural effusion so STAGE IV No germline BRCA mutation, -Homologous recombination deficiency positive.  Myriad Genomic instability score:  positive, somatic BRCA1 positive  07/01/20- 08/12/2020 3 cycles of adjuvant chemotherapy with carboplatin AUC 5, Taxol 175 mg/m. 09/02/2020, started on olaparib 300 mg twice daily.  March 2022 followed up with gyn and  At that time patient had some suprapubic  pressure and pain of unclear etiology.  A CT chest abdomen pelvis was obtained to evaluate further. 11/14/2020 CT chest abdomen pelvis showed no residual suspicious peritoneal nodularity identified.  Unchanged small nodules adjacent to the spleen which are stable since 2017.  Interval resolution of previously seen moderate left pleural effusion.  Minimal residual pleural thickening.    INTERVAL HISTORY 65 year old female presents for follow-up of stage IV ovarian cancer,  somatic BRCA1 positive and HRD positive.  Patient has been on olaparib since January 2022.   Patient tolerates well, no new complaints except that she has noticed some vaginal discharge and itchiness. Chronic neuropathy, stable symptoms.  Takes gabapentin and nortriptyline  Review of Systems  Constitutional:  Negative for appetite change, chills, fatigue and fever.  HENT:   Negative for hearing loss and voice change.   Eyes:  Negative for eye problems.  Respiratory:  Negative for chest tightness and cough.   Cardiovascular:  Negative for chest pain.  Gastrointestinal:  Negative for abdominal distention, abdominal pain and blood in stool.  Endocrine: Negative for hot flashes.  Genitourinary:  Negative for difficulty urinating and frequency.   Musculoskeletal:  Negative for arthralgias.  Skin:  Negative for itching and rash.  Neurological:  Positive for numbness. Negative for extremity weakness.  Hematological:  Negative for adenopathy.  Psychiatric/Behavioral:  Negative for confusion.        No Known Allergies   Past Medical History:  Diagnosis Date   Cancer ([place])    ovarian cancer   Family history of breast cancer  Genetic testing 04/09/2020   No pathogenic variants identified on the Myriad MyRisk (germline) panel. The report date is 04/07/2020.   The [facility] gene panel offered by [employer] includes sequencing and deletion/duplication testing of the following 35 genes: APC, ATM, AXIN2, BARD1,  BMPR1A, BRCA1, BRCA2, BRIP1, CHD1, CDK4, CDKN2A, CHEK2, EPCAM (large rearrangement only), HOXB13, GALNT12, MLH1, MSH2, MSH3, MSH6   HLD (hyperlipidemia)      Past Surgical History:  Procedure Laterality Date   CARPAL TUNNEL RELEASE Left    COLONOSCOPY WITH PROPOFOL N/A 12/10/2020   Procedure: COLONOSCOPY WITH PROPOFOL;  Surgeon: Virgel Manifold, MD;  Location: [facility] ENDOSCOPY;  Service: Endoscopy;  Laterality: N/A;   ROBOTIC ASSISTED TOTAL HYSTERECTOMY WITH BILATERAL SALPINGO OOPHERECTOMY  06/05/2020   with omentectomy tumor debulking via mini-laparotomy/hand assisted port   SHOULDER ARTHROSCOPY WITH SUBACROMIAL DECOMPRESSION AND OPEN ROTATOR C Right 02/01/2020   Procedure: RIGHT SHOULDER ARTHROSCOPIC SUBSCAPULARIS REPAIR, ROTATOR CUFF REPAIR, SUABCROMIAL DECOMPRESSION, AND BICEPS TENODESIS.;  Surgeon: Leim Fabry, MD;  Location: [facility] ORS;  Service: Orthopedics;  Laterality: Right;   TUBAL LIGATION      Social History   Socioeconomic History   Marital status: Single    Spouse name: Not on file   Number of children: Not on file   Years of education: Not on file   Highest education level: Not on file  Occupational History   Not on file  Tobacco Use   Smoking status: Former    Packs/day: 0.75    Years: 40.00    Pack years: 30.00    Types: Cigarettes    Quit date: 02/06/2018    Years since quitting: 2.9   Smokeless tobacco: Never   Tobacco comments:    quit 02/2018  Vaping Use   Vaping Use: Never used  Substance and Sexual Activity   Alcohol use: Yes    Comment: 2-3 glasses of wine per week,none last 24hrs   Drug use: No   Sexual activity: Not on file  Other Topics Concern   Not on file  Social History Narrative   Lives in [place] alone. Work - [employer]   Diet: Regular   Exercise: walking   Social Determinants of Radio broadcast assistant Strain: Not on [employer] Insecurity: Not on file  Transportation Needs: Not on file  Physical Activity: Not on file   Stress: Not on file  Social Connections: Not on file  Intimate Partner Violence: Not on file    Family History  Problem Relation Age of Onset   Cancer Mother        lymphoma   Stroke Sister    Cancer Brother        lung   Cancer Sister        lung   Lung cancer Brother    Bone cancer Sister    Breast cancer Cousin 84       maternal   Cancer Maternal Aunt        unk types     Current Outpatient Medications:    acetaminophen (TYLENOL) 500 MG tablet, Take 2 tablets (1,000 mg total) by mouth every 8 (eight) hours., Disp: 90 tablet, Rfl: 2   cholecalciferol (VITAMIN D3) 10 MCG (400 UNIT) TABS tablet, Take 800 Units by mouth daily., Disp: , Rfl:    clotrimazole (GYNE-LOTRIMIN) 1 % vaginal cream, Place 1 Applicatorful vaginally at bedtime., Disp: 45 g, Rfl: 0   cyanocobalamin 1000 MCG tablet, Take 400 mcg by mouth daily., Disp: , Rfl:  gabapentin (NEURONTIN) 300 MG capsule, Take 1 capsule (300 mg total) by mouth 2 (two) times daily. (Patient taking differently: Take 300 mg by mouth 2 (two) times daily. 1 QAM 2 QHS, 1 tab mid day prn), Disp: 60 capsule, Rfl: 2   meloxicam (MOBIC) 15 MG tablet, Take 1 tablet by mouth daily., Disp: , Rfl:    nortriptyline (PAMELOR) 10 MG capsule, TAKE 1 CAPSULE BY MOUTH AT BEDTIME., Disp: 30 capsule, Rfl: 0   olaparib (LYNPARZA) 150 MG tablet, Take 2 tablets (300 mg total) by mouth 2 (two) times daily. Swallow whole. May take with food to decrease nausea and vomiting., Disp: 120 tablet, Rfl: 2   pravastatin (PRAVACHOL) 40 MG tablet, TAKE 1 TABLET(40 MG) BY MOUTH DAILY, Disp: 90 tablet, Rfl: 0   potassium chloride SA (KLOR-CON) 20 MEQ tablet, Take 1 tablet (20 mEq total) by mouth daily., Disp: 3 tablet, Rfl: 0  Physical exam:  Vitals:   01/21/21 0920  BP: 121/72  Pulse: 74  Resp: 17  Temp: (!) 95.7 F (35.4 C)  SpO2: 100%  Weight: 227 lb 1.6 oz (103 kg)  Height: $Remove'5\' 3"'lrpPfia$  (1.6 m)   Physical Exam Constitutional:      General: She is not in acute  distress.    Appearance: She is not diaphoretic.  HENT:     Head: Normocephalic and atraumatic.     Nose: Nose normal.     Mouth/Throat:     Pharynx: No oropharyngeal exudate.  Eyes:     General: No scleral icterus.    Pupils: Pupils are equal, round, and reactive to light.  Cardiovascular:     Rate and Rhythm: Normal rate and regular rhythm.     Heart sounds: No murmur heard. Pulmonary:     Effort: Pulmonary effort is normal. No respiratory distress.     Breath sounds: No rales.  Chest:     Chest wall: No tenderness.  Abdominal:     General: There is no distension.     Palpations: Abdomen is soft.     Tenderness: There is no abdominal tenderness.  Musculoskeletal:        General: Normal range of motion.     Cervical back: Normal range of motion and neck supple.  Skin:    General: Skin is warm and dry.     Findings: No erythema.  Neurological:     Mental Status: She is alert and oriented to person, place, and time.     Cranial Nerves: No cranial nerve deficit.     Motor: No abnormal muscle tone.     Coordination: Coordination normal.  Psychiatric:        Mood and Affect: Affect normal.         CMP Latest Ref Rng & Units 01/20/2021  Glucose 70 - 99 mg/dL 131(H)  BUN 8 - 23 mg/dL 10  Creatinine 0.44 - 1.00 mg/dL 0.93  Sodium 135 - 145 mmol/L 138  Potassium 3.5 - 5.1 mmol/L 3.3(L)  Chloride 98 - 111 mmol/L 103  CO2 22 - 32 mmol/L 24  Calcium 8.9 - 10.3 mg/dL 9.1  Total Protein 6.5 - 8.1 g/dL 7.1  Total Bilirubin 0.3 - 1.2 mg/dL 0.7  Alkaline Phos 38 - 126 U/L 41  AST 15 - 41 U/L 20  ALT 0 - 44 U/L 16   CBC Latest Ref Rng & Units 01/20/2021  WBC 4.0 - 10.5 K/uL 4.4  Hemoglobin 12.0 - 15.0 g/dL 10.5(L)  Hematocrit 36.0 - 46.0 %  31.4(L)  Platelets 150 - 400 K/uL 161    RADIOGRAPHIC STUDIES: I have personally reviewed the radiological images as listed and agreed with the findings in the report. CT CHEST ABDOMEN PELVIS W CONTRAST  Result Date:  11/16/2020 CLINICAL DATA:  Abdominal pain, metastatic fallopian tube cancer, status post hysterectomy and bilateral salpingectomy and chemotherapy EXAM: CT CHEST, ABDOMEN, AND PELVIS WITH CONTRAST TECHNIQUE: Multidetector CT imaging of the chest, abdomen and pelvis was performed following the standard protocol during bolus administration of intravenous contrast. CONTRAST:  124mL OMNIPAQUE IOHEXOL 300 MG/ML SOLN, additional oral enteric contrast COMPARISON:  CT chest abdomen pelvis, 04/24/2020, CT chest, 05/23/2016 FINDINGS: CT CHEST FINDINGS Cardiovascular: No significant vascular findings. Normal heart size. No pericardial effusion. Mediastinum/Nodes: No enlarged mediastinal, hilar, or axillary lymph nodes. Thyroid gland, trachea, and esophagus demonstrate no significant findings. Lungs/Pleura: Interval resolution of a previously seen moderate left pleural effusion. There is minimal residual pleural thickening (series 2, image 43). Musculoskeletal: No chest wall mass or suspicious bone lesions identified. CT ABDOMEN PELVIS FINDINGS Hepatobiliary: No solid liver abnormality is seen. No gallstones, gallbladder wall thickening, or biliary dilatation. Pancreas: Unremarkable. No pancreatic ductal dilatation or surrounding inflammatory changes. Spleen: Normal in size without significant abnormality. Adrenals/Urinary Tract: Stable, benign adenoma of the lateral limb of the left adrenal gland, seen on examinations dating back to 2017. Kidneys are normal, without renal calculi, solid lesion, or hydronephrosis. Bladder is unremarkable. Stomach/Bowel: Stomach is within normal limits. Appendix appears normal. No evidence of bowel wall thickening, distention, or inflammatory changes. Vascular/Lymphatic: Scattered aortic atherosclerosis. No enlarged abdominal or pelvic lymph nodes. Reproductive: Status post interval hysterectomy and bilateral salpingectomy. Other: No abdominal wall hernia or abnormality. No abdominopelvic  ascites. Interval omentectomy. Unchanged small nodules adjacent to the spleen which are stable compared to examinations dating back to 2017 and likely small splenules and/or lymph nodes (series 2, image 61). Musculoskeletal: No acute or significant osseous findings. IMPRESSION: 1. Status post interval hysterectomy and bilateral salpingectomy. 2. Status post interval omentectomy. There is no residual suspicious peritoneal nodularity identified. Unchanged small nodules adjacent to the spleen which are stable compared to examinations dating back to 2017 and likely small splenules and/or lymph nodes. Attention on follow-up 3. Interval resolution of a previously seen moderate left pleural effusion. There is minimal residual pleural thickening. 4. Findings are consistent with treatment response. 5. No evidence of new metastatic disease in the chest, abdomen, or pelvis. Aortic Atherosclerosis (ICD10-I70.0). Electronically Signed   By: Eddie Candle M.D.   On: 11/16/2020 18:26   MM 3D SCREEN BREAST BILATERAL  Result Date: 11/06/2020 CLINICAL DATA:  Screening. EXAM: DIGITAL SCREENING BILATERAL MAMMOGRAM WITH TOMOSYNTHESIS AND CAD TECHNIQUE: Bilateral screening digital craniocaudal and mediolateral oblique mammograms were obtained. Bilateral screening digital breast tomosynthesis was performed. The images were evaluated with computer-aided detection. COMPARISON:  Previous exam(s). ACR Breast Density Category b: There are scattered areas of fibroglandular density. FINDINGS: There are no findings suspicious for malignancy. The images were evaluated with computer-aided detection. IMPRESSION: No mammographic evidence of malignancy. A result letter of this screening mammogram will be mailed directly to the patient. RECOMMENDATION: Screening mammogram in one year. (Code:SM-B-01Y) BI-RADS CATEGORY  1: Negative. Electronically Signed   By: Lillia Mountain M.D.   On: 11/06/2020 12:39      Assessment and plan Patient is a 65 y.o.  female presents for follow-up for FIGO IIIC ovarian cancer, ( somatic BRCA1 positive, HRD positive). 1. Carcinoma of fallopian tube, unspecified laterality ([place])   2. Encounter  for antineoplastic chemotherapy   3. Neuropathy   4. Normocytic anemia   5. Vaginal discharge    #FIGO IVA Fallopian tube serous carcinoma- + pleural effusion cytology S/p 4 cycles of neoadjuvant chemotherapy  Carboplatin /Taxol  S/p total hysterectomy with salpingo-oophorectomy and omentectomy. Optimal (R<1) interval tumor debulking. S/p 3 cycles of adjuvant carbopltain  Taxol  # HRD, patient is clinically doing well.  CA-125 is being followed, today's level is pending. Labs are reviewed and discussed with patient.  Continue nonoperative 300 mg twice daily.  #chemotherapy induced neuropathy, continue gabapentin 600 mg twice daily plus another 300 mg midday as needed.  Continue nortriptyline 10mg  QHS.  She has been referred previously to acupuncture clinic. Marland Kitchen #Anemia, likely secondary to recent surgery.  Hemoglobin is 10.5.  Macrocytosis likely due to olaparib.  Observation.  #Vaginal discharge and itchiness, clotrimazole vaginal cream topical.  If not helpful, systemic fluconazole can be considered may interact with olaparib-dose need to be adjusted if patient needs systemic fluconazole.  She has upcoming appointment with gynecology oncology for follow-up.  We spent sufficient time to discuss many aspect of care, questions were answered to patient's satisfaction.  Follow-up in 4 weeks   Earlie Server, MD, PhD Hematology Oncology [facility] Pager- [phone] 01/21/2021

## 2021-02-04 ENCOUNTER — Inpatient Hospital Stay (HOSPITAL_BASED_OUTPATIENT_CLINIC_OR_DEPARTMENT_OTHER): Payer: Medicare HMO | Admitting: Obstetrics and Gynecology

## 2021-02-04 VITALS — BP 115/70 | HR 84 | Temp 97.8°F | Resp 20 | Wt 226.2 lb

## 2021-02-04 DIAGNOSIS — C5700 Malignant neoplasm of unspecified fallopian tube: Secondary | ICD-10-CM

## 2021-02-04 DIAGNOSIS — J910 Malignant pleural effusion: Secondary | ICD-10-CM | POA: Diagnosis not present

## 2021-02-04 DIAGNOSIS — D649 Anemia, unspecified: Secondary | ICD-10-CM | POA: Diagnosis not present

## 2021-02-04 DIAGNOSIS — Z90722 Acquired absence of ovaries, bilateral: Secondary | ICD-10-CM | POA: Diagnosis not present

## 2021-02-04 DIAGNOSIS — C763 Malignant neoplasm of pelvis: Secondary | ICD-10-CM

## 2021-02-04 DIAGNOSIS — N760 Acute vaginitis: Secondary | ICD-10-CM

## 2021-02-04 DIAGNOSIS — G620 Drug-induced polyneuropathy: Secondary | ICD-10-CM | POA: Diagnosis not present

## 2021-02-04 DIAGNOSIS — B9689 Other specified bacterial agents as the cause of diseases classified elsewhere: Secondary | ICD-10-CM

## 2021-02-04 DIAGNOSIS — C786 Secondary malignant neoplasm of retroperitoneum and peritoneum: Secondary | ICD-10-CM | POA: Diagnosis not present

## 2021-02-04 DIAGNOSIS — Z90710 Acquired absence of both cervix and uterus: Secondary | ICD-10-CM | POA: Diagnosis not present

## 2021-02-04 DIAGNOSIS — K5900 Constipation, unspecified: Secondary | ICD-10-CM | POA: Diagnosis not present

## 2021-02-04 DIAGNOSIS — Z9221 Personal history of antineoplastic chemotherapy: Secondary | ICD-10-CM | POA: Diagnosis not present

## 2021-02-04 MED ORDER — METRONIDAZOLE 500 MG PO TABS: 500.0000 mg | ORAL_TABLET | Freq: Two times a day (BID) | ORAL | 0 refills | Status: AC

## 2021-02-04 NOTE — Progress Notes (Signed)
Gynecologic Oncology Interval Visit  [facility]  Telephone:([phone] [phone]  Patient Care Team: Burnard Hawthorne, FNP as PCP - General (Family Medicine) Clent Jacks, RN as Oncology Nurse Navigator   Name of the patient: Linda Schmitt  [id number]  1955-12-19   Date of visit: 02/04/2021  Diagnosis: There are no diagnoses linked to this encounter.   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 Tasia Catchings  Subjective:  Linda Schmitt is a 65 y.o. G1P1 female, initially seen in consultation from Dr. 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 She was last seen in clinic on 10/29/2020.   She complains of some vaginal discharge for about a month.  She is sexually active and does pH washes.  No bleeding. Feels well otherwise. Tolerating olaparib well without significant side effects.   Gynecologic Oncology History:  Linda Schmitt is a 65 y.o. female, initially seen in consultation from Dr. 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 Her history is as follows:  She presented with malignant pleural effusion two weeks after elective right shoulder surgery. She underwent thoracentesis on 02/18/2020 and 02/19/2020 with 1.8L and 1.2L removed respectively. Cytology was positive for PAX 8. PET scan showed evidence of peritoneal disease within the abdomen, soft tissue infiltrating into the omentum, large loculated left pleural effusion with thin FDG avid rind of soft tissue overlying the left lung. Small right pleural effusion with mild FDG uptake is equivocal for malignant effusion of the right hemithorax. Fluid density structure within right side of  pelvis abutting the right sided uterus noted though indeterminate. Pelvic u/s obtained showed heterogeneous myometrium with poor definition of endometrial complex. Normal left ovary.  No cystic right adnexal lesion is identified.  No a probable small normal-appearing right ovary is identified.  Suspect that the low-attenuation presumed cystic structure seen in the right adnexa on the prior image corresponds to a small amount of nonspecific free fluid in the right adnexa.  Biopsy of omental mass on 03/05/20  DIAGNOSIS:  A. OMENTAL MASS; CT-GUIDED BIOPSY:  - HIGH-GRADE SEROUS CARCINOMA.   Given that cytology from left pleural effusion is positive for metastatic carcinoma, tumor cells are positive for PAX 8 which indicates GYN origin.  Given that patient has moderate to large amount of pleural effusion, heavy tumor burden, recommend to start chemotherapy as soon as possible with carboplatin AUC 5-6 and paclitaxel 175 mg/m every 3 weeks for 3 cycles.  Patient will establish care with gynecology oncology for evaluation of participating in clinical trials after neoadjuvant chemo/surgery. CA 125 was 53.6 at time of diagnosis.   Treatment Summary: 03/04/20 Cycle 1 carboplatin-paclitaxel CA 125 53.6 03/25/20 Cycle 2 carboplatin-paclitaxel CA 125 39.9 04/15/20  Cycle 3 carboplatin-paclitaxel CA 125 10.5  04/25/20 CT C/A/P revealed moderate left malignant pleural effusion with some pleural thickening and enhancement which was decreased in size compared to prior exam. Omental caking apparent concerning for intraperitoneal metastatic disease. No definite primary malignancy identified. Stable left adrenal nodule. Aortic atherosclerosis.   05/06/2020 Cycle 4 carboplatin-paclitaxel CA125 5.8  On 06/05/2020 she underwent exam under anesthesia, Diagnostic laparoscopy, Robot-assisted total laparoscopic hysterectomy, Bilateral salpingo-oophorectomy, Omentectomy via mini-laparotomy/hand assisted port, and Optimal (R<1)  interval tumor debulking. Pathology c/w tubal origin.   Pathology:  High grade serous adenocarcinoma involving bilateral ovaries and fallopian tubes; peritoneal nodules, cul-de-sac and omentum (primary site left tube). p53, p16 (strong, patchy), Ki67 (elevated)  11/22/2021Cycle #5 paclitaxel and carboplatin CA125 4.6 07/22/2020 Cycle #6 paclitaxel and carboplatin  CA125 4.8  Vitamin B12 = 681  GENETIC TEST RESULTS:   Germline Testing: 04/07/20- negative. No clinically significant mutation identified. Breast Cancer Riskscore remaining life time risk- 7%.   Somatic Testing: Genetic testing reported out on 04/08/2020 through the [facility] + [employer] HRD. No pathogenic variants identified on the [facility] (germline) panel. Pathogenic variant identified through [employer] (HRD testing) in BRCA1 called [id number], HRD positive.    The [facility] gene panel offered by [employer] includes sequencing and deletion/duplication testing of the following 35 genes: APC, ATM, AXIN2, BARD1, BMPR1A, BRCA1, BRCA2, BRIP1, CHD1, CDK4, CDKN2A, CHEK2, EPCAM (large rearrangement only), HOXB13, GALNT12, MLH1, MSH2, MSH3, MSH6, MUTYH, NBN, NTHL1, PALB2, PMS2, PTEN, RAD51C, RAD51D, RNF43, RPS20, SMAD4, STK11, and TP53. Sequencing was performed for select regions of POLE and POLD1, and large rearrangement analysis was performed for select regions of GREM1.   10/29/2020 Patient complaining of pelvic pain/pressure as well as constipation at [place] visit today. CTA ordered and completed on 11/14/2020. Included below.  IMPRESSION: 1. Status post interval hysterectomy and bilateral salpingectomy. 2. Status post interval omentectomy. There is no residual suspicious peritoneal nodularity identified. Unchanged small nodules adjacent to the spleen which are stable compared to examinations dating back to 2017 and likely small splenules and/or lymph nodes. Attention on follow-up 3. Interval resolution of a  previously seen moderate left pleural effusion. There is minimal residual pleural thickening. 4. Findings are consistent with treatment response. 5. No evidence of new metastatic disease in the chest, abdomen, or pelvis.  CA 125 has been followed since diagnosis 02/26/2020 53.6 (high)  03/25/2020 39.9  04/15/2020 10.5 (first normal) 05/06/2020 5.8  06/30/2020 4.6 07/22/2020 4.8 08/12/2020 4.3 09/02/2020 4.3 09/23/2020 4.7  10/21/2020 6.1 11/18/2020 6.9 12/16/2020 4.7 01/20/2021 3.4   Health Maintenance Last Screening Mammogram 11/06/2020. Included below.  FINDINGS: There are no findings suspicious for malignancy. The images were evaluated with computer-aided detection. IMPRESSION: No mammographic evidence of malignancy. A result letter of this screening mammogram will be mailed directly to the patient. Repeat screening mammogram in 1 year.   Last Colonoscopy completed 12/10/2020.   Problem List: Patient Active Problem List   Diagnosis Date Noted   Screen for colon cancer    Polyp of colon    Normocytic anemia 06/30/2020   Carcinoma of fallopian tube ([place]) 06/25/2020   BRCA1 gene mutation positive 04/11/2020   Genetic testing 04/09/2020   Serous carcinoma of female pelvis ([place]) 03/20/2020   Family history of breast cancer    Encounter for antineoplastic chemotherapy 03/04/2020   Neuropathy 03/04/2020   Mass of omentum 02/28/2020   Goals of care, counseling/discussion 02/26/2020   Metastatic carcinoma ([place]) 02/26/2020   Status post thoracentesis    Malignant pleural effusion    Pleural effusion 02/17/2020   Mass of arm, right 01/22/2020   Elevated blood pressure reading 12/28/2019   Hand tingling 12/28/2019   Acute pain of right shoulder 12/01/2018   Adrenal adenoma, left 11/11/2017   Aortic atherosclerosis ([place]) 09/23/2017   Prediabetes 07/09/2017   Ganglion of right wrist 07/06/2017   Hyperlipidemia 10/11/2016   Former smoker 09/21/2016   Depression,  recurrent ([place]) 09/14/2016   Headache 09/14/2016   Routine general medical examination at a health care facility 01/14/2015  Screening for breast cancer 11/12/2013    Past Medical History: Past Medical History:  Diagnosis Date   Cancer [facility])    ovarian cancer   Family history of breast cancer    Genetic testing 04/09/2020   No pathogenic variants identified on the [employer] [facility] (germline) panel. The report date is 04/07/2020.   The [facility] gene panel offered by [employer] includes sequencing and deletion/duplication testing of the following 35 genes: APC, ATM, AXIN2, BARD1, BMPR1A, BRCA1, BRCA2, BRIP1, CHD1, CDK4, CDKN2A, CHEK2, EPCAM (large rearrangement only), HOXB13, GALNT12, MLH1, MSH2, MSH3, MSH6   HLD (hyperlipidemia)     Past Surgical History: Past Surgical History:  Procedure Laterality Date   CARPAL TUNNEL RELEASE Left    COLONOSCOPY WITH PROPOFOL N/A 12/10/2020   Procedure: COLONOSCOPY WITH PROPOFOL;  Surgeon: Virgel Manifold, MD;  Location: [facility] ENDOSCOPY;  Service: Endoscopy;  Laterality: N/A;   ROBOTIC ASSISTED TOTAL HYSTERECTOMY WITH BILATERAL SALPINGO OOPHERECTOMY  06/05/2020   with omentectomy tumor debulking via mini-laparotomy/hand assisted port   SHOULDER ARTHROSCOPY WITH SUBACROMIAL DECOMPRESSION AND OPEN ROTATOR C Right 02/01/2020   Procedure: RIGHT SHOULDER ARTHROSCOPIC SUBSCAPULARIS REPAIR, ROTATOR CUFF REPAIR, SUABCROMIAL DECOMPRESSION, AND BICEPS TENODESIS.;  Surgeon: Leim Fabry, MD;  Location: [facility] ORS;  Service: Orthopedics;  Laterality: Right;   TUBAL LIGATION      Past Gynecologic History: as per HPI   OB History:  OB History  Gravida Para Term Preterm AB Living  1 1       1   SAB IAB Ectopic Multiple Live Births               # Outcome Date GA Lbr Len/2nd Weight Sex Delivery Anes PTL Lv  1 Para             Family History: Family History  Problem Relation Age of Onset   Cancer Mother        lymphoma   Stroke Sister     Cancer Brother        lung   Cancer Sister        lung   Lung cancer Brother    Bone cancer Sister    Breast cancer Cousin 41       maternal   Cancer Maternal Aunt        unk types  Family history significant for lung cancer, lymphoma, and breast cancer.   Social History: Social History   Socioeconomic History   Marital status: Single    Spouse name: Not on file   Number of children: Not on file   Years of education: Not on file   Highest education level: Not on file  Occupational History   Not on file  Tobacco Use   Smoking status: Former    Packs/day: 0.75    Years: 40.00    Pack years: 30.00    Types: Cigarettes    Quit date: 02/06/2018    Years since quitting: 2.9   Smokeless tobacco: Never   Tobacco comments:    quit 02/2018  Vaping Use   Vaping Use: Never used  Substance and Sexual Activity   Alcohol use: Yes    Comment: 2-3 glasses of wine per week,none last 24hrs   Drug use: No   Sexual activity: Not on file  Other Topics Concern   Not on file  Social History Narrative   Lives in [place] alone. Work - [employer]   Diet: Regular   Exercise: walking   Social Determinants of Health  Financial Resource Strain: Not on file  Food Insecurity: Not on file  Transportation Needs: Not on file  Physical Activity: Not on file  Stress: Not on file  Social Connections: Not on file  Intimate Partner Violence: Not on file   Immunization History  Administered Date(s) Administered   PFIZER(Purple Top)SARS-COV-2 Vaccination 05/08/2020, 05/29/2020   Tdap 12/24/2011     Allergies: No Known Allergies  Current Medications: Current Outpatient Medications  Medication Sig Dispense Refill   cholecalciferol (VITAMIN D3) 10 MCG (400 UNIT) TABS tablet Take 800 Units by mouth daily.     clotrimazole (GYNE-LOTRIMIN) 1 % vaginal cream Place 1 Applicatorful vaginally at bedtime. 45 g 0   cyanocobalamin 1000 MCG tablet Take 400 mcg by mouth daily.     gabapentin  (NEURONTIN) 300 MG capsule Take 1 capsule (300 mg total) by mouth 2 (two) times daily. (Patient taking differently: Take 300 mg by mouth 2 (two) times daily. 1 QAM 2 QHS, 1 tab mid day prn) 60 capsule 2   meloxicam (MOBIC) 15 MG tablet Take 1 tablet by mouth daily.     nortriptyline (PAMELOR) 10 MG capsule TAKE 1 CAPSULE BY MOUTH AT BEDTIME. 30 capsule 0   olaparib (LYNPARZA) 150 MG tablet Take 2 tablets (300 mg total) by mouth 2 (two) times daily. Swallow whole. May take with food to decrease nausea and vomiting. 120 tablet 2   potassium chloride SA (KLOR-CON) 20 MEQ tablet Take 1 tablet (20 mEq total) by mouth daily. 3 tablet 0   pravastatin (PRAVACHOL) 40 MG tablet TAKE 1 TABLET(40 MG) BY MOUTH DAILY 90 tablet 0   No current facility-administered medications for this visit.   Immunization History  Administered Date(s) Administered   PFIZER(Purple Top)SARS-COV-2 Vaccination 05/08/2020, 05/29/2020   Tdap 12/24/2011    Review of Systems  Constitutional:  Negative for fever, malaise/fatigue and weight loss.  HENT:  Negative for congestion and hearing loss.   Eyes:  Negative for blurred vision and double vision.  Respiratory:  Negative for cough.   Cardiovascular:  Negative for chest pain and palpitations.  Gastrointestinal:  Negative for abdominal pain, constipation, diarrhea, nausea and vomiting.  Genitourinary:  Negative for frequency and urgency.  Skin:  Negative for rash.  Neurological:  Negative for dizziness, tingling and headaches.  Endo/Heme/Allergies:  Does not bruise/bleed easily.  Psychiatric/Behavioral:  Negative for depression. The patient is not nervous/anxious and does not have insomnia.      Objective:  Physical Examination:  BP 115/70   Pulse 84   Temp 97.8 F (36.6 C)   Resp 20   Wt 226 lb 3.2 oz (102.6 kg)   SpO2 100%   BMI 40.07 kg/m     ECOG Performance Status: 1 - Symptomatic but completely ambulatory  GENERAL: Patient is a well appearing female in no  acute distress HEENT:  Sclera clear. Anicteric NODES:  Negative axillary, supraclavicular, inguinal lymph node survery LUNGS:  Clear to auscultation bilaterally.   HEART:  Regular rate and rhythm.  ABDOMEN:  Soft, nontender.  No hernias, incisions well healed. No masses or ascites EXTREMITIES:  No peripheral edema. Atraumatic. No cyanosis SKIN:  Clear with no obvious rashes or skin changes.  NEURO:  Nonfocal. Well oriented.  Appropriate affect.  Pelvic: chaperoned by CMA EGBUS: no lesions, Cervix: absent, Vagina: no lesions, There is a thin, yellowish discharge cw bacterial vaginosis. Cuff intact. No palpable nodularity.  BME: no palpable masses, smooth. Rectovaginal: deferred  Lab Review n/a  Radiologic Imaging: No imaging  on site today    Assessment:  Linda Schmitt is a 65 y.o. female diagnosed with stage IV high grade serous fallopian tube cancer based on malignant pleural effusion.  Underwent NACT with paclitaxel/carboplatin for 4 cycles and MIS optimal interval debulking on 06/05/2020.  Completed 3 cycles of adjuvant carboplatin-taxol chemotherapy on 08/12/20, currently on maintenance olaparib since January 2022 with Dr Tasia Catchings. NED on exam today.     BRCA1 somatic mutation; HRD+  Bacterial vaginosis.  Medical co-morbidities complicating care: There is no height or weight on file to calculate BMI. Plan:   Problem List Items Addressed This Visit       Genitourinary   Carcinoma of fallopian tube ([place]) - Primary     Other   Serous carcinoma of female pelvis ([place])    Tolerating maintenance PARP inhibitor well without significant side effects.  Blood counts are acceptable.   Will treat BV with Flagyl 500 mg bid for 7 days.   Continue stool softeners as needed for constipation.   Follow up with Dr. Tasia Catchings as scheduled in 1 month for re-evaluation in setting of PARP inhibitor.  Follow-up with GYN oncology in 6 months.   Benedetto Goad, Student FNP  The patient's diagnosis,  an outline of the further diagnostic and laboratory studies which will be required, the recommendation for surgery, and alternatives were discussed with her and her accompanying family members.  All questions were answered to their satisfaction.   I personally interviewed and examined the patient. Agreed with the above/below plan of care. I have directly contributed to assessment and plan of care of this patient and educated and discussed with patient and family.  Mellody Drown, MD

## 2021-02-04 NOTE — Progress Notes (Signed)
Patient states she is having irritation, and  itching.

## 2021-02-16 ENCOUNTER — Inpatient Hospital Stay: Payer: Medicare HMO | Attending: Oncology

## 2021-02-16 DIAGNOSIS — D649 Anemia, unspecified: Secondary | ICD-10-CM | POA: Insufficient documentation

## 2021-02-16 DIAGNOSIS — G620 Drug-induced polyneuropathy: Secondary | ICD-10-CM | POA: Insufficient documentation

## 2021-02-16 DIAGNOSIS — T451X5A Adverse effect of antineoplastic and immunosuppressive drugs, initial encounter: Secondary | ICD-10-CM | POA: Insufficient documentation

## 2021-02-16 DIAGNOSIS — C5700 Malignant neoplasm of unspecified fallopian tube: Secondary | ICD-10-CM

## 2021-02-16 DIAGNOSIS — C763 Malignant neoplasm of pelvis: Secondary | ICD-10-CM | POA: Insufficient documentation

## 2021-02-16 LAB — CBC WITH DIFFERENTIAL/PLATELET
Abs Immature Granulocytes: 0.02 10*3/uL (ref 0.00–0.07)
Basophils Absolute: 0 10*3/uL (ref 0.0–0.1)
Basophils Relative: 0 %
Eosinophils Absolute: 0.1 10*3/uL (ref 0.0–0.5)
Eosinophils Relative: 1 %
HCT: 33.6 % — ABNORMAL LOW (ref 36.0–46.0)
Hemoglobin: 11.3 g/dL — ABNORMAL LOW (ref 12.0–15.0)
Immature Granulocytes: 0 %
Lymphocytes Relative: 26 %
Lymphs Abs: 1.3 10*3/uL (ref 0.7–4.0)
MCH: 34.2 pg — ABNORMAL HIGH (ref 26.0–34.0)
MCHC: 33.6 g/dL (ref 30.0–36.0)
MCV: 101.8 fL — ABNORMAL HIGH (ref 80.0–100.0)
Monocytes Absolute: 0.4 10*3/uL (ref 0.1–1.0)
Monocytes Relative: 9 %
Neutro Abs: 3.1 10*3/uL (ref 1.7–7.7)
Neutrophils Relative %: 64 %
Platelets: 171 10*3/uL (ref 150–400)
RBC: 3.3 MIL/uL — ABNORMAL LOW (ref 3.87–5.11)
RDW: 16.1 % — ABNORMAL HIGH (ref 11.5–15.5)
WBC: 4.9 10*3/uL (ref 4.0–10.5)
nRBC: 0 % (ref 0.0–0.2)

## 2021-02-16 LAB — COMPREHENSIVE METABOLIC PANEL
ALT: 15 U/L (ref 0–44)
AST: 21 U/L (ref 15–41)
Albumin: 4.5 g/dL (ref 3.5–5.0)
Alkaline Phosphatase: 45 U/L (ref 38–126)
Anion gap: 11 (ref 5–15)
BUN: 11 mg/dL (ref 8–23)
CO2: 24 mmol/L (ref 22–32)
Calcium: 9.5 mg/dL (ref 8.9–10.3)
Chloride: 102 mmol/L (ref 98–111)
Creatinine, Ser: 1.03 mg/dL — ABNORMAL HIGH (ref 0.44–1.00)
GFR, Estimated: 60 mL/min — ABNORMAL LOW (ref >60)
Glucose, Bld: 117 mg/dL — ABNORMAL HIGH (ref 70–99)
Potassium: 3.6 mmol/L (ref 3.5–5.1)
Sodium: 137 mmol/L (ref 135–145)
Total Bilirubin: 0.6 mg/dL (ref 0.3–1.2)
Total Protein: 8 g/dL (ref 6.5–8.1)

## 2021-02-17 ENCOUNTER — Ambulatory Visit (INDEPENDENT_AMBULATORY_CARE_PROVIDER_SITE_OTHER): Payer: Medicare HMO | Admitting: Family

## 2021-02-17 ENCOUNTER — Encounter: Payer: Self-pay | Admitting: Family

## 2021-02-17 ENCOUNTER — Other Ambulatory Visit: Payer: Self-pay

## 2021-02-17 VITALS — BP 116/72 | HR 76 | Temp 98.5°F | Ht 63.0 in | Wt 221.8 lb

## 2021-02-17 DIAGNOSIS — E785 Hyperlipidemia, unspecified: Secondary | ICD-10-CM | POA: Diagnosis not present

## 2021-02-17 DIAGNOSIS — I700 Atherosclerosis of aorta: Secondary | ICD-10-CM

## 2021-02-17 DIAGNOSIS — D3502 Benign neoplasm of left adrenal gland: Secondary | ICD-10-CM | POA: Diagnosis not present

## 2021-02-17 DIAGNOSIS — G629 Polyneuropathy, unspecified: Secondary | ICD-10-CM | POA: Diagnosis not present

## 2021-02-17 LAB — CA 125: Cancer Antigen (CA) 125: 4.3 U/mL (ref 0.0–38.1)

## 2021-02-17 MED ORDER — PRAVASTATIN SODIUM 40 MG PO TABS: 40.0000 mg | ORAL_TABLET | Freq: Every evening | ORAL | 3 refills | Status: DC

## 2021-02-17 NOTE — Assessment & Plan Note (Signed)
previously evaluated by endocrine in 2019.  It is stable and she had a normal work up in June 2020.

## 2021-02-17 NOTE — Assessment & Plan Note (Addendum)
Chronic, stable. Continue gabapentin 300mg  1 QAM, 2 QHS, 1 tab mid day prn

## 2021-02-17 NOTE — Assessment & Plan Note (Signed)
Chronic, stable. Continue pravastatin. Pending lipid panel which she will have done at cancer center with next lab draw.

## 2021-02-17 NOTE — Progress Notes (Signed)
Subjective:    Patient ID: Linda Schmitt, female    DOB: 01-27-1956, 65 y.o.   MRN: [id number]  CC: Linda Schmitt is a 65 y.o. female who presents today for follow up.   HPI: Feels well today No new complaints  Atherosclerosis- compliant with pravastatin 68m.   Neuropathy- she is compliant with gabapentin with symptom control  Following with Dr YTasia Catchingsfor Stage IV high grade serous fallopian tube and anemia who suspects secondary to recent surgery; recent consult with Dr BFransisca Connors [place]etto [place] She continues olaparib PO.   Colonoscopy UTD; she declines EGD .Repeat in 10 years Left adrenal nodule - previously evaluated by endocrine in 2019.  It is stable and she had a normal work up in June 2020.  UTD Ct Chest   HISTORY:  Past Medical History:  Diagnosis Date   Cancer [facility]    ovarian cancer   Family history of breast cancer    Genetic testing 04/09/2020   No pathogenic variants identified on the [employer] My[facility] (germline) panel. The report date is 04/07/2020.   [facility] by [employer] sequencing and deletion/duplication testing of the following 35 genes: APC, ATM, AXIN2, BARD1, BMPR1A, BRCA1, BRCA2, BRIP1, CHD1, CDK4, CDKN2A, CHEK2, EPCAM (large rearrangement only), HOXB13, GALNT12, MLH1, MSH2, MSH3, MSH6   HLD (hyperlipidemia)    Past Surgical History:  Procedure Laterality Date   CARPAL TUNNEL RELEASE Left    COLONOSCOPY WITH PROPOFOL N/A 12/10/2020   Procedure: COLONOSCOPY WITH PROPOFOL;  Surgeon: TVirgel Manifold MD;  Location: [facility] ENDOSCOPY;  Service: Endoscopy;  Laterality: N/A;   ROBOTIC ASSISTED TOTAL HYSTERECTOMY WITH BILATERAL SALPINGO OOPHERECTOMY  06/05/2020   with omentectomy tumor debulking via mini-laparotomy/hand assisted port   SHOULDER ARTHROSCOPY WITH SUBACROMIAL DECOMPRESSION AND OPEN ROTATOR C Right 02/01/2020   Procedure: RIGHT SHOULDER ARTHROSCOPIC SUBSCAPULARIS REPAIR, ROTATOR CUFF REPAIR,  SUABCROMIAL DECOMPRESSION, AND BICEPS TENODESIS.;  Surgeon: PLeim Fabry MD;  Location: [facility] ORS;  Service: Orthopedics;  Laterality: Right;   TUBAL LIGATION     Family History  Problem Relation Age of Onset   Cancer Mother        lymphoma   Stroke Sister    Cancer Brother        lung   Cancer Sister        lung   Lung cancer Brother    Bone cancer Sister    Breast cancer Cousin 419      maternal   Cancer Maternal Aunt        unk types    Allergies: Patient has no known allergies. Current Outpatient Medications on File Prior to Visit  Medication Sig Dispense Refill   cholecalciferol (VITAMIN D3) 10 MCG (400 UNIT) TABS tablet Take 800 Units by mouth daily.     clotrimazole (GYNE-LOTRIMIN) 1 % vaginal cream Place 1 Applicatorful vaginally at bedtime. 45 g 0   cyanocobalamin 1000 MCG tablet Take 400 mcg by mouth daily.     gabapentin (NEURONTIN) 300 MG capsule Take 1 capsule (300 mg total) by mouth 2 (two) times daily. (Patient taking differently: Take 300 mg by mouth 2 (two) times daily. 1 QAM 2 QHS, 1 tab mid day prn) 60 capsule 2   meloxicam (MOBIC) 15 MG tablet Take 1 tablet by mouth daily.     nortriptyline (PAMELOR) 10 MG capsule TAKE 1 CAPSULE BY MOUTH AT BEDTIME. 30 capsule 0   olaparib (LYNPARZA) 150 MG tablet Take 2 tablets (300 mg  total) by mouth 2 (two) times daily. Swallow whole. May take with food to decrease nausea and vomiting. 120 tablet 2   potassium chloride SA (KLOR-CON) 20 MEQ tablet Take 1 tablet (20 mEq total) by mouth daily. 3 tablet 0   No current facility-administered medications on file prior to visit.    Social History   Tobacco Use   Smoking status: Former    Packs/day: 0.75    Years: 40.00    Pack years: 30.00    Types: Cigarettes    Quit date: 02/06/2018    Years since quitting: 3.0   Smokeless tobacco: Never   Tobacco comments:    quit 02/2018  Vaping Use   Vaping Use: Never used  Substance Use Topics   Alcohol use: Yes    Comment: 2-3  glasses of wine per week,none last 24hrs   Drug use: No    Review of Systems  Constitutional:  Negative for chills and fever.  Respiratory:  Negative for cough.   Cardiovascular:  Negative for chest pain and palpitations.  Gastrointestinal:  Negative for nausea and vomiting.  Neurological:  Positive for numbness.     Objective:    BP 116/72 (BP Location: Left Arm, Patient Position: Sitting, Cuff Size: Large)   Pulse 76   Temp 98.5 F (36.9 C) (Oral)   Ht 5' 3"  (1.6 m)   Wt 221 lb 12.8 oz (100.6 kg)   SpO2 98%   BMI 39.29 kg/m  BP Readings from Last 3 Encounters:  02/17/21 116/72  02/04/21 115/70  01/21/21 121/72   Wt Readings from Last 3 Encounters:  02/17/21 221 lb 12.8 oz (100.6 kg)  02/04/21 226 lb 3.2 oz (102.6 kg)  01/21/21 227 lb 1.6 oz (103 kg)    Physical Exam Vitals reviewed.  Constitutional:      Appearance: She is well-developed.  Eyes:     Conjunctiva/sclera: Conjunctivae normal.  Cardiovascular:     Rate and Rhythm: Normal rate and regular rhythm.     Pulses: Normal pulses.     Heart sounds: Normal heart sounds.  Pulmonary:     Effort: Pulmonary effort is normal.     Breath sounds: Normal breath sounds. No wheezing, rhonchi or rales.  Skin:    General: Skin is warm and dry.  Neurological:     Mental Status: She is alert.  Psychiatric:        Speech: Speech normal.        Behavior: Behavior normal.        Thought Content: Thought content normal.       Assessment & Plan:   Problem List Items Addressed This Visit       Cardiovascular and Mediastinum   Aortic atherosclerosis ([facility])    Chronic, stable. Continue pravastatin. Pending lipid panel which she will have done at cancer center with next lab draw.        Relevant Medications   pravastatin (PRAVACHOL) 40 MG tablet     Endocrine   Adrenal adenoma, left    previously evaluated by endocrine in 2019.  It is stable and she had a normal work up in June 2020.          Nervous and  Auditory   Neuropathy    Chronic, stable. Continue gabapentin 366m 1 QAM, 2 QHS, 1 tab mid day prn         Other   Hyperlipidemia - Primary   Relevant Medications   pravastatin (PRAVACHOL) 40 MG tablet  Other Relevant Orders   Lipid panel     I have changed Linda Schmitt's pravastatin. I am also having her maintain her cholecalciferol, cyanocobalamin, gabapentin, nortriptyline, meloxicam, olaparib, potassium chloride SA, and clotrimazole.   Meds ordered this encounter  Medications   pravastatin (PRAVACHOL) 40 MG tablet    Sig: Take 1 tablet (40 mg total) by mouth every evening.    Dispense:  90 tablet    Refill:  3    Order Specific Question:   Supervising Provider    Answer:   Crecencio Mc [[id number]]    Return precautions given.   Risks, benefits, and alternatives of the medications and treatment plan prescribed today were discussed, and patient expressed understanding.   Education regarding symptom management and diagnosis given to patient on AVS.  Continue to follow with Burnard Hawthorne, FNP for routine health maintenance.   [place] and I agreed with plan.   [name], FNP

## 2021-02-18 ENCOUNTER — Inpatient Hospital Stay (HOSPITAL_BASED_OUTPATIENT_CLINIC_OR_DEPARTMENT_OTHER): Payer: Medicare HMO | Admitting: Oncology

## 2021-02-18 ENCOUNTER — Encounter: Payer: Self-pay | Admitting: Oncology

## 2021-02-18 VITALS — BP 124/80 | HR 86 | Temp 97.5°F | Resp 18 | Wt 221.8 lb

## 2021-02-18 DIAGNOSIS — G629 Polyneuropathy, unspecified: Secondary | ICD-10-CM | POA: Diagnosis not present

## 2021-02-18 DIAGNOSIS — Z5111 Encounter for antineoplastic chemotherapy: Secondary | ICD-10-CM

## 2021-02-18 DIAGNOSIS — G620 Drug-induced polyneuropathy: Secondary | ICD-10-CM | POA: Diagnosis not present

## 2021-02-18 DIAGNOSIS — T451X5A Adverse effect of antineoplastic and immunosuppressive drugs, initial encounter: Secondary | ICD-10-CM | POA: Diagnosis not present

## 2021-02-18 DIAGNOSIS — D649 Anemia, unspecified: Secondary | ICD-10-CM | POA: Diagnosis not present

## 2021-02-18 DIAGNOSIS — C763 Malignant neoplasm of pelvis: Secondary | ICD-10-CM | POA: Diagnosis not present

## 2021-02-18 NOTE — Progress Notes (Signed)
Patient here for follow up. Pt reports that she has trouble falling asleep.

## 2021-02-18 NOTE — Progress Notes (Signed)
Hematology/Oncology Follow Up Note [facility]  [phone] [phone]  Patient Care Team: Burnard Hawthorne, FNP as PCP - General (Family Medicine) Clent Jacks, RN as Oncology Nurse Navigator   Name of the patient: Linda Schmitt  [id number]  02/04/1956   REASON FOR VISIT  follow-up for serous carcinoma  PERTINENT ONCOLOGY HISTORY Linda Schmitt is a 65 y.o.afemale who has above oncology history reviewed by me today presented for follow up visit for management of malignant pleural effusion status post thoracentesis x2 during her recent admission. Cytology was positive for metastatic carcinoma.  TTF-1 Napsin A, GATA3, CDX2 were negative. Positive for PAX 8, which is most often expressing tumors of gynecological and renal origin. PET scan was independently reviewed by me and discussed with patient.  I also discussed with radiology. Exam showed evidence of peritoneal disease within the abdomen, soft tissue infiltrating into the omentum.  Large loculated left pleural effusion with thin FDG avid rind of soft tissue overlying the left lung.  Small right pleural effusion with mild FDG uptake is equivocal for malignant effusion of the right hemithorax. Fluid density structure within the right side of the pelvis abutting the right-sided of uterus is noted.  This is indeterminate.  Further investigation with pelvic sonogram may be helpful.  Pelvic ultrasound was obtained.-Heterogeneous myometrium with poor definition of endometrial complex. Normal left ovary.  No cystic right adnexal lesion is identified.  No a probable small normal-appearing right ovary is identified.  Suspect that the low-attenuation presumed cystic structure seen in the right adnexa on the prior image corresponds to a small amount of nonspecific free fluid in the right adnexa. I discussed  with gynecology oncology Dr.Secord.  Given that cytology from left pleural effusion is positive for metastatic carcinoma, tumor cells are positive for PAX 8 which indicates GYN origin.  Given that patient has moderate to large amount of pleural effusion, heavy tumor burden, recommend to start chemotherapy as soon as possible with carboplatin AUC 5-6 and Taxol 175 mg/m every 3 weeks for 3 cycles.   #Pre-existing neuropathy of left hand, she takes gabapentin. # seen by Dr. Theora Gianotti on 04/30/2020 and she recommends to proceed with the fourth cycle of chemotherapy and patient will be seen by Dr. Theora Gianotti again on 05/27/2020 for debulking surgery plan on 06/05/2020 with cardiothoracic surgery for thoracoscopy possible thoracic tumor debulking.  Patient was recommended to have to repeat CT of the chest after her chemotherapy.  Oncologic chemotherapy treatments 03/04/2020- 9/28/201 4 cycles of neoadjuvant carbo and Taxol  06/05/2020, status post Total hysterectomy and bilateral salpingo-oophorectomy, Omentectomy, and Optimal (R<1) interval tumor debulking. A. Peritoneal nodule, excisional biopsy: Fibroadipose tissue with calcifications, negative for tumor. B. Uterus, bilateral ovaries and fallopian tubes, hysterectomy and bilateral salpingo-oophorectomy: Cervix: Squamous metaplasia. Endometrium: Inactive endometrium. Myometrium: Adenomyosis. Serosa: High grade serous adenocarcinoma. Right ovary: High grade serous adenocarcinoma, involving the ovarian surface and parenchyma. Right fallopian tube: No specific pathologic change. Left ovary: High grade serous adenocarcinoma, involving the ovarian surface and parenchyma. Left fallopian tube: High grade serous adenocarcinoma, involving tubal fimbria. Serous tubal intraepithelial carcinoma. Immunohistochemistry was performed on block B14 at [facility] to characterize the pathologic process and demonstrates the following immunophenotype in the cells of  interest:POSITIVE:  p53, p16 (strong, patchy), Ki67 (elevated) This staining profile supports the above diagnosis. C. Peritoneal nodule, left pericolic gutter, excisional biopsy: High grade serous adenocarcinoma. D. Cul-de-sac, excisional biopsy: High grade serous adenocarcinoma. E. Omentum, excision: High grade serous  adenocarcinoma (up to 3.0 cm).  TUMOR   Tumor Site:    Left fallopian tube   Histologic Type:    Serous carcinoma   Histologic Grade:    High grade   Tumor Size:    Greatest Dimension (Centimeters): 0.2 cm   Ovarian Surface Involvement:    Present     Laterality:    Bilateral   Fallopian Tube Surface Involvement:    Present     Laterality:    Left   Implants:    Present (sites): left peritoneum, cul-de-sac, omentum   Other Tissue / Organ Involvement:    Right ovary   Other Tissue / Organ Involvement:    Left ovary   Other Tissue / Organ Involvement:    Left fallopian tube   Other Tissue / Organ Involvement:    Pelvic peritoneum   Other Tissue / Organ Involvement:    Omentum   Largest Extrapelvic Peritoneal Focus:    Macroscopic (greater than 2 cm)   Peritoneal / Ascitic Fluid:    Not submitted / unknown   Pleural Fluid:    Not submitted / unknown   Treatment Effect:    Moderate response identified (CRS 2)   LYMPH NODES   Regional Lymph Nodes:    No lymph nodes submitted or found   PATHOLOGIC STAGE CLASSIFICATION (pTNM, AJCC 8th Edition)   TNM Descriptors:    y (post-treatment)   Primary Tumor (pT):    pT3c   Regional Lymph Nodes (pN):    pNX  FIGO Stage:    IIIC   + pleural effusion so STAGE IV No germline BRCA mutation, -Homologous recombination deficiency positive.  Myriad Genomic instability score:  positive, somatic BRCA1 positive  07/01/20- 08/12/2020 3 cycles of adjuvant chemotherapy with carboplatin AUC 5, Taxol 175 mg/m. 09/02/2020, started on olaparib 300 mg twice daily.  March 2022 followed up with gyn and  At that time patient had some suprapubic  pressure and pain of unclear etiology.  A CT chest abdomen pelvis was obtained to evaluate further. 11/14/2020 CT chest abdomen pelvis showed no residual suspicious peritoneal nodularity identified.  Unchanged small nodules adjacent to the spleen which are stable since 2017.  Interval resolution of previously seen moderate left pleural effusion.  Minimal residual pleural thickening.    INTERVAL HISTORY 66 year old female presents for follow-up of stage IV ovarian cancer,  somatic BRCA1 positive and HRD positive.  Patient has been on olaparib since January 2022.   Patient reports feeling well.  She takes olaparib and tolerates well. Reports some trouble falling asleep. Chronic neuropathy, stable symptoms.  Takes gabapentin and nortriptyline  Review of Systems  Constitutional:  Negative for appetite change, chills, fatigue and fever.  HENT:   Negative for hearing loss and voice change.   Eyes:  Negative for eye problems.  Respiratory:  Negative for chest tightness and cough.   Cardiovascular:  Negative for chest pain.  Gastrointestinal:  Negative for abdominal distention, abdominal pain and blood in stool.  Endocrine: Negative for hot flashes.  Genitourinary:  Negative for difficulty urinating and frequency.   Musculoskeletal:  Negative for arthralgias.  Skin:  Negative for itching and rash.  Neurological:  Positive for numbness. Negative for extremity weakness.  Hematological:  Negative for adenopathy.  Psychiatric/Behavioral:  Positive for sleep disturbance. Negative for confusion.        No Known Allergies   Past Medical History:  Diagnosis Date   Cancer ([place])    ovarian cancer   Family history  of breast cancer    Genetic testing 04/09/2020   No pathogenic variants identified on the Myriad MyRisk (germline) panel. The report date is 04/07/2020.   The [facility] gene panel offered by [employer] includes sequencing and deletion/duplication testing of the following 35  genes: APC, ATM, AXIN2, BARD1, BMPR1A, BRCA1, BRCA2, BRIP1, CHD1, CDK4, CDKN2A, CHEK2, EPCAM (large rearrangement only), HOXB13, GALNT12, MLH1, MSH2, MSH3, MSH6   HLD (hyperlipidemia)      Past Surgical History:  Procedure Laterality Date   CARPAL TUNNEL RELEASE Left    COLONOSCOPY WITH PROPOFOL N/A 12/10/2020   Procedure: COLONOSCOPY WITH PROPOFOL;  Surgeon: Virgel Manifold, MD;  Location: [facility] ENDOSCOPY;  Service: Endoscopy;  Laterality: N/A;   ROBOTIC ASSISTED TOTAL HYSTERECTOMY WITH BILATERAL SALPINGO OOPHERECTOMY  06/05/2020   with omentectomy tumor debulking via mini-laparotomy/hand assisted port   SHOULDER ARTHROSCOPY WITH SUBACROMIAL DECOMPRESSION AND OPEN ROTATOR C Right 02/01/2020   Procedure: RIGHT SHOULDER ARTHROSCOPIC SUBSCAPULARIS REPAIR, ROTATOR CUFF REPAIR, SUABCROMIAL DECOMPRESSION, AND BICEPS TENODESIS.;  Surgeon: Leim Fabry, MD;  Location: [facility] ORS;  Service: Orthopedics;  Laterality: Right;   TUBAL LIGATION      Social History   Socioeconomic History   Marital status: Single    Spouse name: Not on file   Number of children: Not on file   Years of education: Not on file   Highest education level: Not on file  Occupational History   Not on file  Tobacco Use   Smoking status: Former    Packs/day: 0.75    Years: 40.00    Pack years: 30.00    Types: Cigarettes    Quit date: 02/06/2018    Years since quitting: 3.0   Smokeless tobacco: Never   Tobacco comments:    quit 02/2018  Vaping Use   Vaping Use: Never used  Substance and Sexual Activity   Alcohol use: Yes    Comment: 2-3 glasses of wine per week,none last 24hrs   Drug use: No   Sexual activity: Not on file  Other Topics Concern   Not on file  Social History Narrative   Lives in [place] alone. Work - [employer]   Diet: Regular   Exercise: walking   Social Determinants of Radio broadcast assistant Strain: Not on [employer] Insecurity: Not on file  Transportation Needs: Not on file   Physical Activity: Not on file  Stress: Not on file  Social Connections: Not on file  Intimate Partner Violence: Not on file    Family History  Problem Relation Age of Onset   Cancer Mother        lymphoma   Stroke Sister    Cancer Brother        lung   Cancer Sister        lung   Lung cancer Brother    Bone cancer Sister    Breast cancer Cousin 40       maternal   Cancer Maternal Aunt        unk types     Current Outpatient Medications:    cholecalciferol (VITAMIN D3) 10 MCG (400 UNIT) TABS tablet, Take 800 Units by mouth daily., Disp: , Rfl:    cyanocobalamin 1000 MCG tablet, Take 400 mcg by mouth daily., Disp: , Rfl:    gabapentin (NEURONTIN) 300 MG capsule, Take 1 capsule (300 mg total) by mouth 2 (two) times daily. (Patient taking differently: Take 300 mg by mouth 2 (two) times daily. 1 QAM 2 QHS, 1  tab mid day prn), Disp: 60 capsule, Rfl: 2   meloxicam (MOBIC) 15 MG tablet, Take 1 tablet by mouth daily., Disp: , Rfl:    nortriptyline (PAMELOR) 10 MG capsule, TAKE 1 CAPSULE BY MOUTH AT BEDTIME., Disp: 30 capsule, Rfl: 0   olaparib (LYNPARZA) 150 MG tablet, Take 2 tablets (300 mg total) by mouth 2 (two) times daily. Swallow whole. May take with food to decrease nausea and vomiting., Disp: 120 tablet, Rfl: 2   pravastatin (PRAVACHOL) 40 MG tablet, Take 1 tablet (40 mg total) by mouth every evening., Disp: 90 tablet, Rfl: 3   clotrimazole (GYNE-LOTRIMIN) 1 % vaginal cream, Place 1 Applicatorful vaginally at bedtime. (Patient not taking: Reported on 02/18/2021), Disp: 45 g, Rfl: 0  Physical exam:  Vitals:   02/18/21 0938  BP: 124/80  Pulse: 86  Resp: 18  Temp: (!) 97.5 F (36.4 C)  Weight: 221 lb 12.8 oz (100.6 kg)   Physical Exam Constitutional:      General: She is not in acute distress.    Appearance: She is not diaphoretic.  HENT:     Head: Normocephalic and atraumatic.     Nose: Nose normal.     Mouth/Throat:     Pharynx: No oropharyngeal exudate.  Eyes:      General: No scleral icterus.    Pupils: Pupils are equal, round, and reactive to light.  Cardiovascular:     Rate and Rhythm: Normal rate and regular rhythm.     Heart sounds: No murmur heard. Pulmonary:     Effort: Pulmonary effort is normal. No respiratory distress.     Breath sounds: No rales.  Chest:     Chest wall: No tenderness.  Abdominal:     General: There is no distension.     Palpations: Abdomen is soft.     Tenderness: There is no abdominal tenderness.  Musculoskeletal:        General: Normal range of motion.     Cervical back: Normal range of motion and neck supple.  Skin:    General: Skin is warm and dry.     Findings: No erythema.  Neurological:     Mental Status: She is alert and oriented to person, place, and time.     Cranial Nerves: No cranial nerve deficit.     Motor: No abnormal muscle tone.     Coordination: Coordination normal.  Psychiatric:        Mood and Affect: Affect normal.         CMP Latest Ref Rng & Units 02/16/2021  Glucose 70 - 99 mg/dL 117(H)  BUN 8 - 23 mg/dL 11  Creatinine 0.44 - 1.00 mg/dL 1.03(H)  Sodium 135 - 145 mmol/L 137  Potassium 3.5 - 5.1 mmol/L 3.6  Chloride 98 - 111 mmol/L 102  CO2 22 - 32 mmol/L 24  Calcium 8.9 - 10.3 mg/dL 9.5  Total Protein 6.5 - 8.1 g/dL 8.0  Total Bilirubin 0.3 - 1.2 mg/dL 0.6  Alkaline Phos 38 - 126 U/L 45  AST 15 - 41 U/L 21  ALT 0 - 44 U/L 15   CBC Latest Ref Rng & Units 02/16/2021  WBC 4.0 - 10.5 K/uL 4.9  Hemoglobin 12.0 - 15.0 g/dL 11.3(L)  Hematocrit 36.0 - 46.0 % 33.6(L)  Platelets 150 - 400 K/uL 171    RADIOGRAPHIC STUDIES: I have personally reviewed the radiological images as listed and agreed with the findings in the report. No results found.    Assessment and  plan Patient is a 65 y.o. female presents for follow-up for FIGO IIIC ovarian cancer, ( somatic BRCA1 positive, HRD positive). 1. 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 Encounter for antineoplastic chemotherapy    3. Neuropathy    #FIGO IVA Fallopian tube serous carcinoma- + pleural effusion cytology S/p 4 cycles of neoadjuvant chemotherapy  Carboplatin /Taxol  S/p total hysterectomy with salpingo-oophorectomy and omentectomy. Optimal (R<1) interval tumor debulking. S/p 3 cycles of adjuvant carbopltain  Taxol  # HRD, patient is clinically doing well.  CA-125 4.3, relatively stable. Labs reviewed and discussed with patient To continue olaparib 300 mg twice daily. Obtain surveillance CT prior to next visit.  #chemotherapy induced neuropathy, continue gabapentin 600 mg twice daily plus another 300 mg midday as needed.  Continue nortriptyline 10mg  QHS.  She has been referred previously to acupuncture clinic. Marland Kitchen #Anemia, likely secondary to recent surgery.  Hemoglobin is 11.3.  Macrocytosis likely due to olaparib.  Observation.  #Slight increase of creatinine level.  Encourage oral hydration.   We spent sufficient time to discuss many aspect of care, questions were answered to patient's satisfaction.  Follow-up in 4 weeks   Earlie Server, MD, PhD Hematology Oncology [facility] Pager- [phone] 02/18/2021

## 2021-02-24 ENCOUNTER — Other Ambulatory Visit: Payer: Self-pay | Admitting: Oncology

## 2021-02-25 ENCOUNTER — Encounter: Payer: Self-pay | Admitting: Oncology

## 2021-03-15 ENCOUNTER — Encounter: Payer: Self-pay | Admitting: Oncology

## 2021-03-16 ENCOUNTER — Other Ambulatory Visit: Payer: Self-pay

## 2021-03-16 ENCOUNTER — Inpatient Hospital Stay: Payer: Medicare HMO | Attending: Oncology

## 2021-03-16 DIAGNOSIS — Z823 Family history of stroke: Secondary | ICD-10-CM | POA: Insufficient documentation

## 2021-03-16 DIAGNOSIS — Z808 Family history of malignant neoplasm of other organs or systems: Secondary | ICD-10-CM | POA: Diagnosis not present

## 2021-03-16 DIAGNOSIS — G479 Sleep disorder, unspecified: Secondary | ICD-10-CM | POA: Insufficient documentation

## 2021-03-16 DIAGNOSIS — Z801 Family history of malignant neoplasm of trachea, bronchus and lung: Secondary | ICD-10-CM | POA: Diagnosis not present

## 2021-03-16 DIAGNOSIS — G620 Drug-induced polyneuropathy: Secondary | ICD-10-CM | POA: Diagnosis not present

## 2021-03-16 DIAGNOSIS — Z803 Family history of malignant neoplasm of breast: Secondary | ICD-10-CM | POA: Insufficient documentation

## 2021-03-16 DIAGNOSIS — J910 Malignant pleural effusion: Secondary | ICD-10-CM | POA: Diagnosis not present

## 2021-03-16 DIAGNOSIS — T451X5A Adverse effect of antineoplastic and immunosuppressive drugs, initial encounter: Secondary | ICD-10-CM | POA: Diagnosis not present

## 2021-03-16 DIAGNOSIS — Z1509 Genetic susceptibility to other malignant neoplasm: Secondary | ICD-10-CM | POA: Diagnosis not present

## 2021-03-16 DIAGNOSIS — N800 Endometriosis of uterus: Secondary | ICD-10-CM | POA: Insufficient documentation

## 2021-03-16 DIAGNOSIS — D7589 Other specified diseases of blood and blood-forming organs: Secondary | ICD-10-CM | POA: Insufficient documentation

## 2021-03-16 DIAGNOSIS — D649 Anemia, unspecified: Secondary | ICD-10-CM | POA: Insufficient documentation

## 2021-03-16 DIAGNOSIS — Z87891 Personal history of nicotine dependence: Secondary | ICD-10-CM | POA: Insufficient documentation

## 2021-03-16 DIAGNOSIS — Z1502 Genetic susceptibility to malignant neoplasm of ovary: Secondary | ICD-10-CM | POA: Insufficient documentation

## 2021-03-16 DIAGNOSIS — C563 Malignant neoplasm of bilateral ovaries: Secondary | ICD-10-CM | POA: Diagnosis not present

## 2021-03-16 DIAGNOSIS — C763 Malignant neoplasm of pelvis: Secondary | ICD-10-CM

## 2021-03-16 DIAGNOSIS — Z1501 Genetic susceptibility to malignant neoplasm of breast: Secondary | ICD-10-CM | POA: Insufficient documentation

## 2021-03-16 DIAGNOSIS — E785 Hyperlipidemia, unspecified: Secondary | ICD-10-CM | POA: Diagnosis not present

## 2021-03-16 DIAGNOSIS — Z79899 Other long term (current) drug therapy: Secondary | ICD-10-CM | POA: Insufficient documentation

## 2021-03-16 DIAGNOSIS — R200 Anesthesia of skin: Secondary | ICD-10-CM | POA: Diagnosis not present

## 2021-03-16 LAB — LIPID PANEL
Cholesterol: 182 mg/dL (ref 0–200)
HDL: 43 mg/dL (ref >40)
LDL Cholesterol: 121 mg/dL — ABNORMAL HIGH (ref 0–99)
Total CHOL/HDL Ratio: 4.2 RATIO
Triglycerides: 90 mg/dL (ref <150)
VLDL: 18 mg/dL (ref 0–40)

## 2021-03-16 LAB — CBC WITH DIFFERENTIAL/PLATELET
Abs Immature Granulocytes: 0.02 10*3/uL (ref 0.00–0.07)
Basophils Absolute: 0 10*3/uL (ref 0.0–0.1)
Basophils Relative: 1 %
Eosinophils Absolute: 0 10*3/uL (ref 0.0–0.5)
Eosinophils Relative: 1 %
HCT: 30.3 % — ABNORMAL LOW (ref 36.0–46.0)
Hemoglobin: 9.8 g/dL — ABNORMAL LOW (ref 12.0–15.0)
Immature Granulocytes: 1 %
Lymphocytes Relative: 24 %
Lymphs Abs: 1 10*3/uL (ref 0.7–4.0)
MCH: 33.8 pg (ref 26.0–34.0)
MCHC: 32.3 g/dL (ref 30.0–36.0)
MCV: 104.5 fL — ABNORMAL HIGH (ref 80.0–100.0)
Monocytes Absolute: 0.5 10*3/uL (ref 0.1–1.0)
Monocytes Relative: 11 %
Neutro Abs: 2.7 10*3/uL (ref 1.7–7.7)
Neutrophils Relative %: 62 %
Platelets: 181 10*3/uL (ref 150–400)
RBC: 2.9 MIL/uL — ABNORMAL LOW (ref 3.87–5.11)
RDW: 15.7 % — ABNORMAL HIGH (ref 11.5–15.5)
WBC: 4.3 10*3/uL (ref 4.0–10.5)
nRBC: 0 % (ref 0.0–0.2)

## 2021-03-16 LAB — COMPREHENSIVE METABOLIC PANEL
ALT: 15 U/L (ref 0–44)
AST: 20 U/L (ref 15–41)
Albumin: 4 g/dL (ref 3.5–5.0)
Alkaline Phosphatase: 39 U/L (ref 38–126)
Anion gap: 10 (ref 5–15)
BUN: 11 mg/dL (ref 8–23)
CO2: 25 mmol/L (ref 22–32)
Calcium: 8.9 mg/dL (ref 8.9–10.3)
Chloride: 100 mmol/L (ref 98–111)
Creatinine, Ser: 1.05 mg/dL — ABNORMAL HIGH (ref 0.44–1.00)
GFR, Estimated: 58 mL/min — ABNORMAL LOW (ref >60)
Glucose, Bld: 112 mg/dL — ABNORMAL HIGH (ref 70–99)
Potassium: 3.6 mmol/L (ref 3.5–5.1)
Sodium: 135 mmol/L (ref 135–145)
Total Bilirubin: 0.5 mg/dL (ref 0.3–1.2)
Total Protein: 7.3 g/dL (ref 6.5–8.1)

## 2021-03-17 LAB — CA 125: Cancer Antigen (CA) 125: 4.9 U/mL (ref 0.0–38.1)

## 2021-03-18 ENCOUNTER — Other Ambulatory Visit: Payer: Self-pay

## 2021-03-18 ENCOUNTER — Ambulatory Visit
Admission: RE | Admit: 2021-03-18 | Discharge: 2021-03-18 | Disposition: A | Discharge status: Home / Self Care | Payer: Medicare HMO | Source: Ambulatory Visit | Attending: Oncology | Admitting: Oncology

## 2021-03-18 DIAGNOSIS — R590 Localized enlarged lymph nodes: Secondary | ICD-10-CM | POA: Diagnosis not present

## 2021-03-18 DIAGNOSIS — C763 Malignant neoplasm of pelvis: Secondary | ICD-10-CM | POA: Insufficient documentation

## 2021-03-18 DIAGNOSIS — C569 Malignant neoplasm of unspecified ovary: Secondary | ICD-10-CM | POA: Diagnosis not present

## 2021-03-18 DIAGNOSIS — Z90710 Acquired absence of both cervix and uterus: Secondary | ICD-10-CM | POA: Diagnosis not present

## 2021-03-18 MED ORDER — IOHEXOL 350 MG/ML SOLN
100.0000 mL | Freq: Once | INTRAVENOUS | Status: AC | PRN
  Administered 2021-03-18: 100 mL via INTRAVENOUS

## 2021-03-25 ENCOUNTER — Encounter: Payer: Self-pay | Admitting: Oncology

## 2021-03-25 ENCOUNTER — Inpatient Hospital Stay (HOSPITAL_BASED_OUTPATIENT_CLINIC_OR_DEPARTMENT_OTHER): Payer: Medicare HMO | Admitting: Oncology

## 2021-03-25 VITALS — BP 119/83 | HR 92 | Temp 97.7°F | Resp 18 | Wt 215.6 lb

## 2021-03-25 DIAGNOSIS — N800 Endometriosis of uterus: Secondary | ICD-10-CM | POA: Diagnosis not present

## 2021-03-25 DIAGNOSIS — C563 Malignant neoplasm of bilateral ovaries: Secondary | ICD-10-CM | POA: Diagnosis not present

## 2021-03-25 DIAGNOSIS — G620 Drug-induced polyneuropathy: Secondary | ICD-10-CM | POA: Diagnosis not present

## 2021-03-25 DIAGNOSIS — D649 Anemia, unspecified: Secondary | ICD-10-CM | POA: Diagnosis not present

## 2021-03-25 DIAGNOSIS — T451X5A Adverse effect of antineoplastic and immunosuppressive drugs, initial encounter: Secondary | ICD-10-CM | POA: Diagnosis not present

## 2021-03-25 DIAGNOSIS — E785 Hyperlipidemia, unspecified: Secondary | ICD-10-CM | POA: Diagnosis not present

## 2021-03-25 DIAGNOSIS — G479 Sleep disorder, unspecified: Secondary | ICD-10-CM | POA: Diagnosis not present

## 2021-03-25 DIAGNOSIS — D7589 Other specified diseases of blood and blood-forming organs: Secondary | ICD-10-CM | POA: Diagnosis not present

## 2021-03-25 DIAGNOSIS — J910 Malignant pleural effusion: Secondary | ICD-10-CM | POA: Diagnosis not present

## 2021-03-25 DIAGNOSIS — G629 Polyneuropathy, unspecified: Secondary | ICD-10-CM

## 2021-03-25 DIAGNOSIS — R200 Anesthesia of skin: Secondary | ICD-10-CM | POA: Diagnosis not present

## 2021-03-25 DIAGNOSIS — Z5111 Encounter for antineoplastic chemotherapy: Secondary | ICD-10-CM

## 2021-03-25 DIAGNOSIS — C5700 Malignant neoplasm of unspecified fallopian tube: Secondary | ICD-10-CM | POA: Diagnosis not present

## 2021-03-25 DIAGNOSIS — C763 Malignant neoplasm of pelvis: Secondary | ICD-10-CM

## 2021-03-25 MED ORDER — OLAPARIB 150 MG PO TABS: 300.0000 mg | ORAL_TABLET | Freq: Two times a day (BID) | ORAL | 2 refills | Status: DC

## 2021-03-25 NOTE — Progress Notes (Signed)
Hematology/Oncology Follow Up Note [facility]  [phone] [phone]  Patient Care Team: Burnard Hawthorne, FNP as PCP - General (Family Medicine) Clent Jacks, RN as Oncology Nurse Navigator   Name of the patient: Linda Schmitt  [id number]  02/04/1956   REASON FOR VISIT  follow-up for serous carcinoma  PERTINENT ONCOLOGY HISTORY [name] is a 65 y.o.afemale who has above oncology history reviewed by me today presented for follow up visit for management of malignant pleural effusion status post thoracentesis x2 during her recent admission. Cytology was positive for metastatic carcinoma.  TTF-1 Napsin A, GATA3, CDX2 were negative. Positive for PAX 8, which is most often expressing tumors of gynecological and renal origin. PET scan was independently reviewed by me and discussed with patient.  I also discussed with radiology. Exam showed evidence of peritoneal disease within the abdomen, soft tissue infiltrating into the omentum.  Large loculated left pleural effusion with thin FDG avid rind of soft tissue overlying the left lung.  Small right pleural effusion with mild FDG uptake is equivocal for malignant effusion of the right hemithorax. Fluid density structure within the right side of the pelvis abutting the right-sided of uterus is noted.  This is indeterminate.  Further investigation with pelvic sonogram may be helpful.  Pelvic ultrasound was obtained.-Heterogeneous myometrium with poor definition of endometrial complex. Normal left ovary.  No cystic right adnexal lesion is identified.  No a probable small normal-appearing right ovary is identified.  Suspect that the low-attenuation presumed cystic structure seen in the right adnexa on the prior image corresponds to a small amount of nonspecific free fluid in the right adnexa. I discussed  with gynecology oncology Dr.Secord.  Given that cytology from left pleural effusion is positive for metastatic carcinoma, tumor cells are positive for PAX 8 which indicates GYN origin.  Given that patient has moderate to large amount of pleural effusion, heavy tumor burden, recommend to start chemotherapy as soon as possible with carboplatin AUC 5-6 and Taxol 175 mg/m every 3 weeks for 3 cycles.   #Pre-existing neuropathy of left hand, she takes gabapentin. # seen by Dr. Theora Gianotti on 04/30/2020 and she recommends to proceed with the fourth cycle of chemotherapy and patient will be seen by Dr. Theora Gianotti again on 05/27/2020 for debulking surgery plan on 06/05/2020 with cardiothoracic surgery for thoracoscopy possible thoracic tumor debulking.  Patient was recommended to have to repeat CT of the chest after her chemotherapy.  Oncologic chemotherapy treatments 03/04/2020- 9/28/201 4 cycles of neoadjuvant carbo and Taxol  06/05/2020, status post Total hysterectomy and bilateral salpingo-oophorectomy, Omentectomy, and Optimal (R<1) interval tumor debulking. A. Peritoneal nodule, excisional biopsy: Fibroadipose tissue with calcifications, negative for tumor. B. Uterus, bilateral ovaries and fallopian tubes, hysterectomy and bilateral salpingo-oophorectomy: Cervix: Squamous metaplasia. Endometrium: Inactive endometrium. Myometrium: Adenomyosis. Serosa: High grade serous adenocarcinoma. Right ovary: High grade serous adenocarcinoma, involving the ovarian surface and parenchyma. Right fallopian tube: No specific pathologic change. Left ovary: High grade serous adenocarcinoma, involving the ovarian surface and parenchyma. Left fallopian tube: High grade serous adenocarcinoma, involving tubal fimbria. Serous tubal intraepithelial carcinoma. Immunohistochemistry was performed on block B14 at [facility] to characterize the pathologic process and demonstrates the following immunophenotype in the cells of  interest:POSITIVE:  p53, p16 (strong, patchy), Ki67 (elevated) This staining profile supports the above diagnosis. C. Peritoneal nodule, left pericolic gutter, excisional biopsy: High grade serous adenocarcinoma. D. Cul-de-sac, excisional biopsy: High grade serous adenocarcinoma. E. Omentum, excision: High grade serous  adenocarcinoma (up to 3.0 cm).  TUMOR   Tumor Site:    Left fallopian tube   Histologic Type:    Serous carcinoma   Histologic Grade:    High grade   Tumor Size:    Greatest Dimension (Centimeters): 0.2 cm   Ovarian Surface Involvement:    Present     Laterality:    Bilateral   Fallopian Tube Surface Involvement:    Present     Laterality:    Left   Implants:    Present (sites): left peritoneum, cul-de-sac, omentum   Other Tissue / Organ Involvement:    Right ovary   Other Tissue / Organ Involvement:    Left ovary   Other Tissue / Organ Involvement:    Left fallopian tube   Other Tissue / Organ Involvement:    Pelvic peritoneum   Other Tissue / Organ Involvement:    Omentum   Largest Extrapelvic Peritoneal Focus:    Macroscopic (greater than 2 cm)   Peritoneal / Ascitic Fluid:    Not submitted / unknown   Pleural Fluid:    Not submitted / unknown   Treatment Effect:    Moderate response identified (CRS 2)   LYMPH NODES   Regional Lymph Nodes:    No lymph nodes submitted or found   PATHOLOGIC STAGE CLASSIFICATION (pTNM, AJCC 8th Edition)   TNM Descriptors:    y (post-treatment)   Primary Tumor (pT):    pT3c   Regional Lymph Nodes (pN):    pNX  FIGO Stage:    IIIC   + pleural effusion so STAGE IV No germline BRCA mutation, -Homologous recombination deficiency positive.  Myriad Genomic instability score:  positive, somatic BRCA1 positive  07/01/20- 08/12/2020 3 cycles of adjuvant chemotherapy with carboplatin AUC 5, Taxol 175 mg/m. 09/02/2020, started on olaparib 300 mg twice daily.  March 2022 followed up with gyn and  At that time patient had some suprapubic  pressure and pain of unclear etiology.  A CT chest abdomen pelvis was obtained to evaluate further. 11/14/2020 CT chest abdomen pelvis showed no residual suspicious peritoneal nodularity identified.  Unchanged small nodules adjacent to the spleen which are stable since 2017.  Interval resolution of previously seen moderate left pleural effusion.  Minimal residual pleural thickening.    INTERVAL HISTORY 65 year old female presents for follow-up of stage IV ovarian cancer,  somatic BRCA1 positive and HRD positive.  Patient has been on olaparib since January 2022.   Overall she tolerates the treatment with no significant side effects. Chronic neuropathy, symptoms are stable.  She takes gabapentin and nortriptyline  Review of Systems  Constitutional:  Negative for appetite change, chills, fatigue and fever.  HENT:   Negative for hearing loss and voice change.   Eyes:  Negative for eye problems.  Respiratory:  Negative for chest tightness and cough.   Cardiovascular:  Negative for chest pain.  Gastrointestinal:  Negative for abdominal distention, abdominal pain and blood in stool.  Endocrine: Negative for hot flashes.  Genitourinary:  Negative for difficulty urinating and frequency.   Musculoskeletal:  Negative for arthralgias.  Skin:  Negative for itching and rash.  Neurological:  Positive for numbness. Negative for extremity weakness.  Hematological:  Negative for adenopathy.  Psychiatric/Behavioral:  Positive for sleep disturbance. Negative for confusion.        No Known Allergies   Past Medical History:  Diagnosis Date   Cancer ([place])    ovarian cancer   Family history of breast cancer  Genetic testing 04/09/2020   No pathogenic variants identified on the Myriad MyRisk (germline) panel. The report date is 04/07/2020.   The [facility] gene panel offered by [employer] includes sequencing and deletion/duplication testing of the following 35 genes: APC, ATM, AXIN2, BARD1,  BMPR1A, BRCA1, BRCA2, BRIP1, CHD1, CDK4, CDKN2A, CHEK2, EPCAM (large rearrangement only), HOXB13, GALNT12, MLH1, MSH2, MSH3, MSH6   HLD (hyperlipidemia)      Past Surgical History:  Procedure Laterality Date   CARPAL TUNNEL RELEASE Left    COLONOSCOPY WITH PROPOFOL N/A 12/10/2020   Procedure: COLONOSCOPY WITH PROPOFOL;  Surgeon: Virgel Manifold, MD;  Location: [facility] ENDOSCOPY;  Service: Endoscopy;  Laterality: N/A;   ROBOTIC ASSISTED TOTAL HYSTERECTOMY WITH BILATERAL SALPINGO OOPHERECTOMY  06/05/2020   with omentectomy tumor debulking via mini-laparotomy/hand assisted port   SHOULDER ARTHROSCOPY WITH SUBACROMIAL DECOMPRESSION AND OPEN ROTATOR C Right 02/01/2020   Procedure: RIGHT SHOULDER ARTHROSCOPIC SUBSCAPULARIS REPAIR, ROTATOR CUFF REPAIR, SUABCROMIAL DECOMPRESSION, AND BICEPS TENODESIS.;  Surgeon: Leim Fabry, MD;  Location: [facility] ORS;  Service: Orthopedics;  Laterality: Right;   TUBAL LIGATION      Social History   Socioeconomic History   Marital status: Single    Spouse name: Not on file   Number of children: Not on file   Years of education: Not on file   Highest education level: Not on file  Occupational History   Not on file  Tobacco Use   Smoking status: Former    Packs/day: 0.75    Years: 40.00    Pack years: 30.00    Types: Cigarettes    Quit date: 02/06/2018    Years since quitting: 3.1   Smokeless tobacco: Never   Tobacco comments:    quit 02/2018  Vaping Use   Vaping Use: Never used  Substance and Sexual Activity   Alcohol use: Yes    Comment: 2-3 glasses of wine per week,none last 24hrs   Drug use: No   Sexual activity: Not on file  Other Topics Concern   Not on file  Social History Narrative   Lives in [place] alone. Work - [employer]   Diet: Regular   Exercise: walking   Social Determinants of Radio broadcast assistant Strain: Not on [employer] Insecurity: Not on file  Transportation Needs: Not on file  Physical Activity: Not on file   Stress: Not on file  Social Connections: Not on file  Intimate Partner Violence: Not on file    Family History  Problem Relation Age of Onset   Cancer Mother        lymphoma   Stroke Sister    Cancer Brother        lung   Cancer Sister        lung   Lung cancer Brother    Bone cancer Sister    Breast cancer Cousin 73       maternal   Cancer Maternal Aunt        unk types     Current Outpatient Medications:    cholecalciferol (VITAMIN D3) 10 MCG (400 UNIT) TABS tablet, Take 800 Units by mouth daily., Disp: , Rfl:    cyanocobalamin 1000 MCG tablet, Take 400 mcg by mouth daily., Disp: , Rfl:    gabapentin (NEURONTIN) 300 MG capsule, Take 1 capsule (300 mg total) by mouth 2 (two) times daily. (Patient taking differently: Take 300 mg by mouth 2 (two) times daily. 1 QAM 2 QHS, 1 tab mid day prn), Disp: 60  capsule, Rfl: 2   meloxicam (MOBIC) 15 MG tablet, Take 1 tablet by mouth daily., Disp: , Rfl:    nortriptyline (PAMELOR) 10 MG capsule, TAKE 1 CAPSULE BY MOUTH EVERYDAY AT BEDTIME, Disp: 90 capsule, Rfl: 1   pravastatin (PRAVACHOL) 40 MG tablet, Take 1 tablet (40 mg total) by mouth every evening., Disp: 90 tablet, Rfl: 3   clotrimazole (GYNE-LOTRIMIN) 1 % vaginal cream, Place 1 Applicatorful vaginally at bedtime. (Patient not taking: No sig reported), Disp: 45 g, Rfl: 0   olaparib (LYNPARZA) 150 MG tablet, Take 2 tablets (300 mg total) by mouth 2 (two) times daily. Swallow whole. May take with food to decrease nausea and vomiting., Disp: 120 tablet, Rfl: 2  Physical exam:  Vitals:   03/25/21 0953  BP: 119/83  Pulse: 92  Resp: 18  Temp: 97.7 F (36.5 C)  SpO2: 100%  Weight: 215 lb 9.6 oz (97.8 kg)   Physical Exam Constitutional:      General: She is not in acute distress.    Appearance: She is not diaphoretic.  HENT:     Head: Normocephalic and atraumatic.     Nose: Nose normal.     Mouth/Throat:     Pharynx: No oropharyngeal exudate.  Eyes:     General: No scleral  icterus.    Pupils: Pupils are equal, round, and reactive to light.  Cardiovascular:     Rate and Rhythm: Normal rate and regular rhythm.     Heart sounds: No murmur heard. Pulmonary:     Effort: Pulmonary effort is normal. No respiratory distress.     Breath sounds: No rales.  Chest:     Chest wall: No tenderness.  Abdominal:     General: There is no distension.     Palpations: Abdomen is soft.     Tenderness: There is no abdominal tenderness.  Musculoskeletal:        General: Normal range of motion.     Cervical back: Normal range of motion and neck supple.  Skin:    General: Skin is warm and dry.     Findings: No erythema.  Neurological:     Mental Status: She is alert and oriented to person, place, and time.     Cranial Nerves: No cranial nerve deficit.     Motor: No abnormal muscle tone.     Coordination: Coordination normal.  Psychiatric:        Mood and Affect: Affect normal.         CMP Latest Ref Rng & Units 03/16/2021  Glucose 70 - 99 mg/dL 112(H)  BUN 8 - 23 mg/dL 11  Creatinine 0.44 - 1.00 mg/dL 1.05(H)  Sodium 135 - 145 mmol/L 135  Potassium 3.5 - 5.1 mmol/L 3.6  Chloride 98 - 111 mmol/L 100  CO2 22 - 32 mmol/L 25  Calcium 8.9 - 10.3 mg/dL 8.9  Total Protein 6.5 - 8.1 g/dL 7.3  Total Bilirubin 0.3 - 1.2 mg/dL 0.5  Alkaline Phos 38 - 126 U/L 39  AST 15 - 41 U/L 20  ALT 0 - 44 U/L 15   CBC Latest Ref Rng & Units 03/16/2021  WBC 4.0 - 10.5 K/uL 4.3  Hemoglobin 12.0 - 15.0 g/dL 9.8(L)  Hematocrit 36.0 - 46.0 % 30.3(L)  Platelets 150 - 400 K/uL 181    RADIOGRAPHIC STUDIES: I have personally reviewed the radiological images as listed and agreed with the findings in the report. CT CHEST ABDOMEN PELVIS W CONTRAST  Result Date: 03/18/2021 CLINICAL  DATA:  Ovarian cancer follow-up. Fallopian tube cancer diagnosed July 2021. Chemotherapy and surgery with hysterectomy and bilateral salpingectomy. EXAM: CT CHEST, ABDOMEN, AND PELVIS WITH CONTRAST TECHNIQUE:  Multidetector CT imaging of the chest, abdomen and pelvis was performed following the standard protocol during bolus administration of intravenous contrast. CONTRAST:  171m OMNIPAQUE IOHEXOL 350 MG/ML SOLN COMPARISON:  CT 11/14/2020 FINDINGS: CT CHEST FINDINGS Cardiovascular: No significant vascular findings. Normal heart size. No pericardial effusion. Mediastinum/Nodes: Small lymph node in the anterior mediastinum measuring 8 mm (image 19/2) is unchanged from 7 mm on CT 05/20/2020. Additional paratracheal nodes are unchanged. RIGHT hilar node measuring 10 mm (image 25/2) compares to 9 mm on most recent CT scan. Lungs/Pleura: No suspicious pulmonary nodule.  No pleural fluid. Musculoskeletal: No aggressive osseous lesion. CT ABDOMEN AND PELVIS FINDINGS Hepatobiliary: No focal hepatic lesion. No biliary ductal dilatation. Gallbladder is normal. Common bile duct is normal. Pancreas: Pancreas is normal. No ductal dilatation. No pancreatic inflammation. Spleen: Normal spleen Adrenals/urinary tract: Nodule of the LEFT adrenal gland measuring 14 mm unchanged. Kidneys, ureters and bladder normal Stomach/Bowel: Stomach, small bowel, appendix, and cecum are normal. The colon and rectosigmoid colon are normal. Vascular/Lymphatic: Abdominal aorta is normal caliber. There is no retroperitoneal or periportal lymphadenopathy. No pelvic lymphadenopathy. Reproductive: Post hysterectomy.  Adnexa unremarkable Other: No ventral peritoneal nodularity or omental nodularity. Multiple small Peri splenic nodules are unchanged and likely represent small splenules. Musculoskeletal: No aggressive osseous lesion. IMPRESSION: Chest Impression: 1. No evidence metastatic disease in the thorax. 2. Stable borderline enlarged mediastinal lymph nodes and RIGHT hilar node. Recommend attention on follow-up. Abdomen / Pelvis Impression: 1. No evidence of peritoneal or omental nodal metastatic recurrence. 2. No evidence of local recurrence in the  pelvis. 3. No free fluid in the abdomen pelvis. Electronically Signed   By: SSuzy BouchardM.D.   On: 03/18/2021 21:24      Assessment and plan Patient is a 65y.o. female presents for follow-up for FIGO IIIC ovarian cancer, ( somatic BRCA1 positive, HRD positive). 1. Ser[place] of [place]   2. Neuropathy   3. Carcinoma of fallopian tube, unspecified [place]ality ([place]   4. Normocytic anemia   5. Encounter for antineoplastic chemotherapy    #FIGO IVA Fallopian tube serous carcinoma- + pleural effusion cytology S/p 4 cycles of neoadjuvant chemotherapy  Carboplatin /Taxol  S/p total hysterectomy with salpingo-oophorectomy and omentectomy. Optimal (R<1) interval tumor debulking. S/p 3 cycles of adjuvant carbopltain/ Taxol  # HRD, patient is clinically doing well.  CA-125 4.9, slowly trending up, still within normal limits. Labs reviewed and discussed with patient. Continue olaparib 300 mg twice daily. 03/18/2021, CT showed no obvious cancer metastasis or recurrence. There are borderline enlarged mediastinal lymph node and a right hilar node.  Attention on follow-up  #chemotherapy induced neuropathy, on gabapentin 600 mg twice daily plus another 300 mg midday as needed.  Continue nortriptyline 153mQHS.  She has been referred previously to acupuncture clinic.  Continue current regimen. . Marland KitchenAnemia,   Hemoglobin is 9.8.  Macrocytosis likely due to olaparib.  Observation. Patient has normal B12 and folate. This may be secondary to olaparib side effects.  Monitor.  We spent sufficient time to discuss many aspect of care, questions were answered to patient's satisfaction.  Follow-up in 4 weeks   ZhEarlie ServerMD, PhD Hematology Oncology CoReconstructive [facility] AlPear[facility] Surgery Center LLCager- [phone]/17/2022

## 2021-03-25 NOTE — Progress Notes (Signed)
Patient here for follow up. Reports occasional excessive sweating and is unsure what may be causing it.

## 2021-04-28 ENCOUNTER — Telehealth: Payer: Self-pay | Admitting: Family

## 2021-05-01 ENCOUNTER — Other Ambulatory Visit: Payer: Self-pay

## 2021-05-01 DIAGNOSIS — C763 Malignant neoplasm of pelvis: Secondary | ICD-10-CM

## 2021-05-04 ENCOUNTER — Inpatient Hospital Stay: Payer: Medicare HMO | Attending: Oncology

## 2021-05-04 ENCOUNTER — Other Ambulatory Visit: Payer: Self-pay

## 2021-05-04 DIAGNOSIS — C763 Malignant neoplasm of pelvis: Secondary | ICD-10-CM | POA: Diagnosis present

## 2021-05-04 DIAGNOSIS — Z87891 Personal history of nicotine dependence: Secondary | ICD-10-CM | POA: Insufficient documentation

## 2021-05-04 DIAGNOSIS — R599 Enlarged lymph nodes, unspecified: Secondary | ICD-10-CM | POA: Insufficient documentation

## 2021-05-04 DIAGNOSIS — J910 Malignant pleural effusion: Secondary | ICD-10-CM | POA: Diagnosis not present

## 2021-05-04 DIAGNOSIS — T451X5A Adverse effect of antineoplastic and immunosuppressive drugs, initial encounter: Secondary | ICD-10-CM | POA: Diagnosis not present

## 2021-05-04 DIAGNOSIS — G620 Drug-induced polyneuropathy: Secondary | ICD-10-CM | POA: Insufficient documentation

## 2021-05-04 DIAGNOSIS — D649 Anemia, unspecified: Secondary | ICD-10-CM | POA: Diagnosis not present

## 2021-05-04 LAB — CBC WITH DIFFERENTIAL/PLATELET
Abs Immature Granulocytes: 0.01 10*3/uL (ref 0.00–0.07)
Basophils Absolute: 0 10*3/uL (ref 0.0–0.1)
Basophils Relative: 0 %
Eosinophils Absolute: 0.1 10*3/uL (ref 0.0–0.5)
Eosinophils Relative: 1 %
HCT: 33.3 % — ABNORMAL LOW (ref 36.0–46.0)
Hemoglobin: 10.8 g/dL — ABNORMAL LOW (ref 12.0–15.0)
Immature Granulocytes: 0 %
Lymphocytes Relative: 26 %
Lymphs Abs: 1.2 10*3/uL (ref 0.7–4.0)
MCH: 34.4 pg — ABNORMAL HIGH (ref 26.0–34.0)
MCHC: 32.4 g/dL (ref 30.0–36.0)
MCV: 106.1 fL — ABNORMAL HIGH (ref 80.0–100.0)
Monocytes Absolute: 0.4 10*3/uL (ref 0.1–1.0)
Monocytes Relative: 9 %
Neutro Abs: 2.9 10*3/uL (ref 1.7–7.7)
Neutrophils Relative %: 64 %
Platelets: 167 10*3/uL (ref 150–400)
RBC: 3.14 MIL/uL — ABNORMAL LOW (ref 3.87–5.11)
RDW: 16.6 % — ABNORMAL HIGH (ref 11.5–15.5)
WBC: 4.6 10*3/uL (ref 4.0–10.5)
nRBC: 0 % (ref 0.0–0.2)

## 2021-05-04 LAB — COMPREHENSIVE METABOLIC PANEL
ALT: 13 U/L (ref 0–44)
AST: 17 U/L (ref 15–41)
Albumin: 4.1 g/dL (ref 3.5–5.0)
Alkaline Phosphatase: 47 U/L (ref 38–126)
Anion gap: 7 (ref 5–15)
BUN: 10 mg/dL (ref 8–23)
CO2: 26 mmol/L (ref 22–32)
Calcium: 9 mg/dL (ref 8.9–10.3)
Chloride: 105 mmol/L (ref 98–111)
Creatinine, Ser: 1 mg/dL (ref 0.44–1.00)
GFR, Estimated: >60 mL/min (ref >60)
Glucose, Bld: 119 mg/dL — ABNORMAL HIGH (ref 70–99)
Potassium: 3.6 mmol/L (ref 3.5–5.1)
Sodium: 138 mmol/L (ref 135–145)
Total Bilirubin: 0.8 mg/dL (ref 0.3–1.2)
Total Protein: 7.1 g/dL (ref 6.5–8.1)

## 2021-05-05 LAB — CA 125: Cancer Antigen (CA) 125: 3.9 U/mL (ref 0.0–38.1)

## 2021-05-06 ENCOUNTER — Inpatient Hospital Stay (HOSPITAL_BASED_OUTPATIENT_CLINIC_OR_DEPARTMENT_OTHER): Payer: Medicare HMO | Admitting: Oncology

## 2021-05-06 ENCOUNTER — Encounter: Payer: Self-pay | Admitting: Oncology

## 2021-05-06 VITALS — BP 114/75 | HR 74 | Temp 96.6°F | Resp 16 | Wt 219.0 lb

## 2021-05-06 DIAGNOSIS — G629 Polyneuropathy, unspecified: Secondary | ICD-10-CM

## 2021-05-06 DIAGNOSIS — D649 Anemia, unspecified: Secondary | ICD-10-CM | POA: Diagnosis not present

## 2021-05-06 DIAGNOSIS — T451X5A Adverse effect of antineoplastic and immunosuppressive drugs, initial encounter: Secondary | ICD-10-CM | POA: Diagnosis not present

## 2021-05-06 DIAGNOSIS — R599 Enlarged lymph nodes, unspecified: Secondary | ICD-10-CM | POA: Diagnosis not present

## 2021-05-06 DIAGNOSIS — J910 Malignant pleural effusion: Secondary | ICD-10-CM | POA: Diagnosis not present

## 2021-05-06 DIAGNOSIS — Z87891 Personal history of nicotine dependence: Secondary | ICD-10-CM | POA: Diagnosis not present

## 2021-05-06 DIAGNOSIS — G620 Drug-induced polyneuropathy: Secondary | ICD-10-CM | POA: Diagnosis not present

## 2021-05-06 DIAGNOSIS — Z5111 Encounter for antineoplastic chemotherapy: Secondary | ICD-10-CM

## 2021-05-06 DIAGNOSIS — C763 Malignant neoplasm of pelvis: Secondary | ICD-10-CM | POA: Diagnosis not present

## 2021-05-06 NOTE — Progress Notes (Signed)
Hematology/Oncology Follow Up Note [facility]  Telephone:([phone] [phone]  Patient Care Team: Burnard Hawthorne, FNP as PCP - General (Family Medicine) Clent Jacks, RN as Oncology Nurse Navigator   Name of the patient: Linda Schmitt  [id number]  05-Jan-1956   REASON FOR VISIT  follow-up for stage IV ovarian cancer,  PERTINENT ONCOLOGY HISTORY Linda Schmitt is a 65 y.o.afemale who has above oncology history reviewed by me today presented for follow up visit for management of malignant pleural effusion status post thoracentesis x2 during her recent admission. Cytology was positive for metastatic carcinoma.  TTF-1 Napsin A, GATA3, CDX2 were negative. Positive for PAX 8, which is most often expressing tumors of gynecological and renal origin. PET scan was independently reviewed by me and discussed with patient.  I also discussed with radiology. Exam showed evidence of peritoneal disease within the abdomen, soft tissue infiltrating into the omentum.  Large loculated left pleural effusion with thin FDG avid rind of soft tissue overlying the left lung.  Small right pleural effusion with mild FDG uptake is equivocal for malignant effusion of the right hemithorax. Fluid density structure within the right side of the pelvis abutting the right-sided of uterus is noted.  This is indeterminate.  Further investigation with pelvic sonogram may be helpful.  Pelvic ultrasound was obtained.-Heterogeneous myometrium with poor definition of endometrial complex. Normal left ovary.  No cystic right adnexal lesion is identified.  No a probable small normal-appearing right ovary is identified.  Suspect that the low-attenuation presumed cystic structure seen in the right adnexa on the prior image corresponds to a small amount of nonspecific free fluid in the right adnexa. I  discussed with gynecology oncology Dr.Secord.  Given that cytology from left pleural effusion is positive for metastatic carcinoma, tumor cells are positive for PAX 8 which indicates GYN origin.  Given that patient has moderate to large amount of pleural effusion, heavy tumor burden, recommend to start chemotherapy as soon as possible with carboplatin AUC 5-6 and Taxol 175 mg/m every 3 weeks for 3 cycles.   #Pre-existing neuropathy of left hand, she takes gabapentin. # seen by Dr. Theora Gianotti on 04/30/2020 and she recommends to proceed with the fourth cycle of chemotherapy and patient will be seen by Dr. Theora Gianotti again on 05/27/2020 for debulking surgery plan on 06/05/2020 with cardiothoracic surgery for thoracoscopy possible thoracic tumor debulking.  Patient was recommended to have to repeat CT of the chest after her chemotherapy.  Oncologic chemotherapy treatments 03/04/2020- 9/28/201 4 cycles of neoadjuvant carbo and Taxol  06/05/2020, status post Total hysterectomy and bilateral salpingo-oophorectomy, Omentectomy, and Optimal (R<1) interval tumor debulking. A. Peritoneal nodule, excisional biopsy: Fibroadipose tissue with calcifications, negative for tumor. B. Uterus, bilateral ovaries and fallopian tubes, hysterectomy and bilateral salpingo-oophorectomy: Cervix: Squamous metaplasia. Endometrium: Inactive endometrium. Myometrium: Adenomyosis. Serosa: High grade serous adenocarcinoma. Right ovary: High grade serous adenocarcinoma, involving the ovarian surface and parenchyma. Right fallopian tube: No specific pathologic change. Left ovary: High grade serous adenocarcinoma, involving the ovarian surface and parenchyma. Left fallopian tube: High grade serous adenocarcinoma, involving tubal fimbria. Serous tubal intraepithelial carcinoma. Immunohistochemistry was performed on block B14 at [facility] to characterize the pathologic process and demonstrates the following immunophenotype in the cells of  interest:POSITIVE:  p53, p16 (strong, patchy), Ki67 (elevated) This staining profile supports the above diagnosis. C. Peritoneal nodule, left pericolic gutter, excisional biopsy: High grade serous adenocarcinoma. D. Cul-de-sac, excisional biopsy: High grade serous adenocarcinoma. E. Omentum, excision: High  grade serous adenocarcinoma (up to 3.0 cm).  TUMOR   Tumor Site:    Left fallopian tube   Histologic Type:    Serous carcinoma   Histologic Grade:    High grade   Tumor Size:    Greatest Dimension (Centimeters): 0.2 cm   Ovarian Surface Involvement:    Present     Laterality:    Bilateral   Fallopian Tube Surface Involvement:    Present     Laterality:    Left   Implants:    Present (sites): left peritoneum, cul-de-sac, omentum   Other Tissue / Organ Involvement:    Right ovary   Other Tissue / Organ Involvement:    Left ovary   Other Tissue / Organ Involvement:    Left fallopian tube   Other Tissue / Organ Involvement:    Pelvic peritoneum   Other Tissue / Organ Involvement:    Omentum   Largest Extrapelvic Peritoneal Focus:    Macroscopic (greater than 2 cm)   Peritoneal / Ascitic Fluid:    Not submitted / unknown   Pleural Fluid:    Not submitted / unknown   Treatment Effect:    Moderate response identified (CRS 2)   LYMPH NODES   Regional Lymph Nodes:    No lymph nodes submitted or found   PATHOLOGIC STAGE CLASSIFICATION (pTNM, AJCC 8th Edition)   TNM Descriptors:    y (post-treatment)   Primary Tumor (pT):    pT3c   Regional Lymph Nodes (pN):    pNX  FIGO Stage:    IIIC   + pleural effusion so STAGE IV No germline BRCA mutation, -Homologous recombination deficiency positive.  Myriad Genomic instability score:  positive, somatic BRCA1 positive  07/01/20- 08/12/2020 3 cycles of adjuvant chemotherapy with carboplatin AUC 5, Taxol 175 mg/m. 09/02/2020, started on olaparib 300 mg twice daily.  March 2022 followed up with gyn and  At that time patient had some suprapubic  pressure and pain of unclear etiology.  A CT chest abdomen pelvis was obtained to evaluate further. 11/14/2020 CT chest abdomen pelvis showed no residual suspicious peritoneal nodularity identified.  Unchanged small nodules adjacent to the spleen which are stable since 2017.  Interval resolution of previously seen moderate left pleural effusion.  Minimal residual pleural thickening.  03/18/2021, CT showed no obvious cancer metastasis or recurrence. There are borderline enlarged mediastinal lymph node and a right hilar node.  Attention on follow-up  INTERVAL HISTORY 65 year old female presents for follow-up of stage IV ovarian cancer,  somatic BRCA1 positive and HRD positive.  Patient has been on olaparib since January 2022.   She tolerates well. No new complaints.  Chronic neuropathy, symptoms are stable.  She takes gabapentin and nortriptyline  Review of Systems  Constitutional:  Negative for appetite change, chills, fatigue and fever.  HENT:   Negative for hearing loss and voice change.   Eyes:  Negative for eye problems.  Respiratory:  Negative for chest tightness and cough.   Cardiovascular:  Negative for chest pain.  Gastrointestinal:  Negative for abdominal distention, abdominal pain and blood in stool.  Endocrine: Negative for hot flashes.  Genitourinary:  Negative for difficulty urinating and frequency.   Musculoskeletal:  Negative for arthralgias.  Skin:  Negative for itching and rash.  Neurological:  Positive for numbness. Negative for extremity weakness.  Hematological:  Negative for adenopathy.  Psychiatric/Behavioral:  Positive for sleep disturbance. Negative for confusion.        No Known Allergies  Past Medical History:  Diagnosis Date   Cancer [facility])    ovarian cancer   Family history of breast cancer    Genetic testing 04/09/2020   No pathogenic variants identified on the Myriad MyRisk (germline) panel. The report date is 04/07/2020.   The [facility] gene panel offered  by [employer] includes sequencing and deletion/duplication testing of the following 35 genes: APC, ATM, AXIN2, BARD1, BMPR1A, BRCA1, BRCA2, BRIP1, CHD1, CDK4, CDKN2A, CHEK2, EPCAM (large rearrangement only), HOXB13, GALNT12, MLH1, MSH2, MSH3, MSH6   HLD (hyperlipidemia)      Past Surgical History:  Procedure Laterality Date   CARPAL TUNNEL RELEASE Left    COLONOSCOPY WITH PROPOFOL N/A 12/10/2020   Procedure: COLONOSCOPY WITH PROPOFOL;  Surgeon: Virgel Manifold, MD;  Location: [facility] ENDOSCOPY;  Service: Endoscopy;  Laterality: N/A;   ROBOTIC ASSISTED TOTAL HYSTERECTOMY WITH BILATERAL SALPINGO OOPHERECTOMY  06/05/2020   with omentectomy tumor debulking via mini-laparotomy/hand assisted port   SHOULDER ARTHROSCOPY WITH SUBACROMIAL DECOMPRESSION AND OPEN ROTATOR C Right 02/01/2020   Procedure: RIGHT SHOULDER ARTHROSCOPIC SUBSCAPULARIS REPAIR, ROTATOR CUFF REPAIR, SUABCROMIAL DECOMPRESSION, AND BICEPS TENODESIS.;  Surgeon: Leim Fabry, MD;  Location: [facility] ORS;  Service: Orthopedics;  Laterality: Right;   TUBAL LIGATION      Social History   Socioeconomic History   Marital status: Single    Spouse name: Not on file   Number of children: Not on file   Years of education: Not on file   Highest education level: Not on file  Occupational History   Not on file  Tobacco Use   Smoking status: Former    Packs/day: 0.75    Years: 40.00    Pack years: 30.00    Types: Cigarettes    Quit date: 02/06/2018    Years since quitting: 3.2   Smokeless tobacco: Never   Tobacco comments:    quit 02/2018  Vaping Use   Vaping Use: Never used  Substance and Sexual Activity   Alcohol use: Yes    Comment: 2-3 glasses of wine per week,none last 24hrs   Drug use: No   Sexual activity: Not on file  Other Topics Concern   Not on file  Social History Narrative   Lives in [place] alone. Work - [employer]   Diet: Regular   Exercise: walking   Social Determinants of Systems developer Strain: Not on [employer] Insecurity: Not on file  Transportation Needs: Not on file  Physical Activity: Not on file  Stress: Not on file  Social Connections: Not on file  Intimate Partner Violence: Not on file    Family History  Problem Relation Age of Onset   Cancer Mother        lymphoma   Stroke Sister    Cancer Brother        lung   Cancer Sister        lung   Lung cancer Brother    Bone cancer Sister    Breast cancer Cousin 73       maternal   Cancer Maternal Aunt        unk types     Current Outpatient Medications:    cholecalciferol (VITAMIN D3) 10 MCG (400 UNIT) TABS tablet, Take 800 Units by mouth daily., Disp: , Rfl:    cyanocobalamin 1000 MCG tablet, Take 400 mcg by mouth daily., Disp: , Rfl:    meloxicam (MOBIC) 15 MG tablet, Take 1 tablet by mouth daily., Disp: , Rfl:  nortriptyline (PAMELOR) 10 MG capsule, TAKE 1 CAPSULE BY MOUTH EVERYDAY AT BEDTIME, Disp: 90 capsule, Rfl: 1   olaparib (LYNPARZA) 150 MG tablet, Take 2 tablets (300 mg total) by mouth 2 (two) times daily. Swallow whole. May take with food to decrease nausea and vomiting., Disp: 120 tablet, Rfl: 2   pravastatin (PRAVACHOL) 40 MG tablet, Take 1 tablet (40 mg total) by mouth every evening., Disp: 90 tablet, Rfl: 3   clotrimazole (GYNE-LOTRIMIN) 1 % vaginal cream, Place 1 Applicatorful vaginally at bedtime. (Patient not taking: No sig reported), Disp: 45 g, Rfl: 0   gabapentin (NEURONTIN) 300 MG capsule, Take 1 capsule (300 mg total) by mouth 2 (two) times daily. (Patient not taking: Reported on 05/06/2021), Disp: 60 capsule, Rfl: 2  Physical exam:  Vitals:   05/06/21 1006  BP: 114/75  Pulse: 74  Resp: 16  Temp: (!) 96.6 F (35.9 C)  TempSrc: Tympanic  SpO2: 100%  Weight: 219 lb (99.3 kg)   Physical Exam Constitutional:      General: She is not in acute distress.    Appearance: She is not diaphoretic.  HENT:     Head: Normocephalic and atraumatic.     Nose: Nose normal.      Mouth/Throat:     Pharynx: No oropharyngeal exudate.  Eyes:     General: No scleral icterus.    Pupils: Pupils are equal, round, and reactive to light.  Cardiovascular:     Rate and Rhythm: Normal rate and regular rhythm.     Heart sounds: No murmur heard. Pulmonary:     Effort: Pulmonary effort is normal. No respiratory distress.     Breath sounds: No rales.  Chest:     Chest wall: No tenderness.  Abdominal:     General: There is no distension.     Palpations: Abdomen is soft.     Tenderness: There is no abdominal tenderness.  Musculoskeletal:        General: Normal range of motion.     Cervical back: Normal range of motion and neck supple.  Skin:    General: Skin is warm and dry.     Findings: No erythema.  Neurological:     Mental Status: She is alert and oriented to person, place, and time.     Cranial Nerves: No cranial nerve deficit.     Motor: No abnormal muscle tone.     Coordination: Coordination normal.  Psychiatric:        Mood and Affect: Affect normal.         CMP Latest Ref Rng & Units 05/04/2021  Glucose 70 - 99 mg/dL 119(H)  BUN 8 - 23 mg/dL 10  Creatinine 0.44 - 1.00 mg/dL 1.00  Sodium 135 - 145 mmol/L 138  Potassium 3.5 - 5.1 mmol/L 3.6  Chloride 98 - 111 mmol/L 105  CO2 22 - 32 mmol/L 26  Calcium 8.9 - 10.3 mg/dL 9.0  Total Protein 6.5 - 8.1 g/dL 7.1  Total Bilirubin 0.3 - 1.2 mg/dL 0.8  Alkaline Phos 38 - 126 U/L 47  AST 15 - 41 U/L 17  ALT 0 - 44 U/L 13   CBC Latest Ref Rng & Units 05/04/2021  WBC 4.0 - 10.5 K/uL 4.6  Hemoglobin 12.0 - 15.0 g/dL 10.8(L)  Hematocrit 36.0 - 46.0 % 33.3(L)  Platelets 150 - 400 K/uL 167    RADIOGRAPHIC STUDIES: I have personally reviewed the radiological images as listed and agreed with the findings in the report. CT  CHEST ABDOMEN PELVIS W CONTRAST  Result Date: 03/18/2021 CLINICAL DATA:  Ovarian cancer follow-up. Fallopian tube cancer diagnosed July 2021. Chemotherapy and surgery with hysterectomy and  bilateral salpingectomy. EXAM: CT CHEST, ABDOMEN, AND PELVIS WITH CONTRAST TECHNIQUE: Multidetector CT imaging of the chest, abdomen and pelvis was performed following the standard protocol during bolus administration of intravenous contrast. CONTRAST:  170m OMNIPAQUE IOHEXOL 350 MG/ML SOLN COMPARISON:  CT 11/14/2020 FINDINGS: CT CHEST FINDINGS Cardiovascular: No significant vascular findings. Normal heart size. No pericardial effusion. Mediastinum/Nodes: Small lymph node in the anterior mediastinum measuring 8 mm (image 19/2) is unchanged from 7 mm on CT 05/20/2020. Additional paratracheal nodes are unchanged. RIGHT hilar node measuring 10 mm (image 25/2) compares to 9 mm on most recent CT scan. Lungs/Pleura: No suspicious pulmonary nodule.  No pleural fluid. Musculoskeletal: No aggressive osseous lesion. CT ABDOMEN AND PELVIS FINDINGS Hepatobiliary: No focal hepatic lesion. No biliary ductal dilatation. Gallbladder is normal. Common bile duct is normal. Pancreas: Pancreas is normal. No ductal dilatation. No pancreatic inflammation. Spleen: Normal spleen Adrenals/urinary tract: Nodule of the LEFT adrenal gland measuring 14 mm unchanged. Kidneys, ureters and bladder normal Stomach/Bowel: Stomach, small bowel, appendix, and cecum are normal. The colon and rectosigmoid colon are normal. Vascular/Lymphatic: Abdominal aorta is normal caliber. There is no retroperitoneal or periportal lymphadenopathy. No pelvic lymphadenopathy. Reproductive: Post hysterectomy.  Adnexa unremarkable Other: No ventral peritoneal nodularity or omental nodularity. Multiple small Peri splenic nodules are unchanged and likely represent small splenules. Musculoskeletal: No aggressive osseous lesion. IMPRESSION: Chest Impression: 1. No evidence metastatic disease in the thorax. 2. Stable borderline enlarged mediastinal lymph nodes and RIGHT hilar node. Recommend attention on follow-up. Abdomen / Pelvis Impression: 1. No evidence of peritoneal or  omental nodal metastatic recurrence. 2. No evidence of local recurrence in the pelvis. 3. No free fluid in the abdomen pelvis. Electronically Signed   By: SSuzy BouchardM.D.   On: 03/18/2021 21:24      Assessment and plan Patient is a 65y.o. female presents for follow-up for FIGO IIIC ovarian cancer, ( somatic BRCA1 positive, HRD positive). 1. 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 Optimal (R<1) interval tumor debulking. S/p 3 cycles of adjuvant carbopltain/ Taxol  # HRD, patient is clinically doing well.  CA-125 3.9, stable.  Labs are reviewed and discussed with patient. Continue Olarparib 3078mBID.   borderline enlarged mediastinal lymph node and a right hilar node, attention on follow up  #chemotherapy induced neuropathy, on gabapentin 600 mg twice daily plus another 300 mg midday as needed.  Continue nortriptyline 1038mHS.  She has been referred previously to acupuncture clinic.  Continue current regimen. . #Marland Kitchennemia,   Hemoglobin is 10.8.  Macrocytosis likely due to olaparib.  monitor Patient has normal B12 and folate. This may be secondary to olaparib side effects.   We spent sufficient time to discuss many aspect of care, questions were answered to patient's satisfaction.  Follow-up in 4 weeks   ZhoEarlie ServerD, PhD Hematology Oncology [facility]- [id number]/2022

## 2021-05-06 NOTE — Progress Notes (Signed)
Pt in for follow up, denies any concerns or difficulties today. 

## 2021-05-15 ENCOUNTER — Ambulatory Visit: Payer: Medicare HMO

## 2021-05-26 ENCOUNTER — Telehealth: Payer: Self-pay | Admitting: Pharmacist

## 2021-05-26 NOTE — Telephone Encounter (Signed)
Patient called you about the note you sent her.

## 2021-05-26 NOTE — Telephone Encounter (Signed)
Called patient back. Confirmed that she will go pick up her pravastatin script tomorrow.

## 2021-05-26 NOTE — Telephone Encounter (Signed)
This patient is appearing on the insurance-provided list for being at risk of failing the adherence measure for cholesterol medications this calendar year.   Medication: pravastatin 40 mg Last fill date: 10/11 - I called the pharmacy yesterday and the medication was filled and ready to be picked up.   If patient calls back, please tell her that the insurance asked [place] to reach out to ensure she picks up the pravastatin 40 mg daily by 05/27/21 (Wednesday). Can transfer to me if questions.

## 2021-06-01 ENCOUNTER — Other Ambulatory Visit: Payer: Self-pay

## 2021-06-01 ENCOUNTER — Inpatient Hospital Stay: Payer: Medicare HMO | Attending: Oncology

## 2021-06-01 DIAGNOSIS — R599 Enlarged lymph nodes, unspecified: Secondary | ICD-10-CM | POA: Diagnosis not present

## 2021-06-01 DIAGNOSIS — Z87891 Personal history of nicotine dependence: Secondary | ICD-10-CM | POA: Insufficient documentation

## 2021-06-01 DIAGNOSIS — T451X5A Adverse effect of antineoplastic and immunosuppressive drugs, initial encounter: Secondary | ICD-10-CM | POA: Insufficient documentation

## 2021-06-01 DIAGNOSIS — D649 Anemia, unspecified: Secondary | ICD-10-CM | POA: Diagnosis not present

## 2021-06-01 DIAGNOSIS — J910 Malignant pleural effusion: Secondary | ICD-10-CM | POA: Insufficient documentation

## 2021-06-01 DIAGNOSIS — G620 Drug-induced polyneuropathy: Secondary | ICD-10-CM | POA: Diagnosis not present

## 2021-06-01 DIAGNOSIS — C763 Malignant neoplasm of pelvis: Secondary | ICD-10-CM | POA: Insufficient documentation

## 2021-06-01 LAB — CBC WITH DIFFERENTIAL/PLATELET
Abs Immature Granulocytes: 0.01 10*3/uL (ref 0.00–0.07)
Basophils Absolute: 0 10*3/uL (ref 0.0–0.1)
Basophils Relative: 1 %
Eosinophils Absolute: 0.1 10*3/uL (ref 0.0–0.5)
Eosinophils Relative: 1 %
HCT: 32.5 % — ABNORMAL LOW (ref 36.0–46.0)
Hemoglobin: 10.9 g/dL — ABNORMAL LOW (ref 12.0–15.0)
Immature Granulocytes: 0 %
Lymphocytes Relative: 30 %
Lymphs Abs: 1.3 10*3/uL (ref 0.7–4.0)
MCH: 35.6 pg — ABNORMAL HIGH (ref 26.0–34.0)
MCHC: 33.5 g/dL (ref 30.0–36.0)
MCV: 106.2 fL — ABNORMAL HIGH (ref 80.0–100.0)
Monocytes Absolute: 0.4 10*3/uL (ref 0.1–1.0)
Monocytes Relative: 9 %
Neutro Abs: 2.5 10*3/uL (ref 1.7–7.7)
Neutrophils Relative %: 59 %
Platelets: 179 10*3/uL (ref 150–400)
RBC: 3.06 MIL/uL — ABNORMAL LOW (ref 3.87–5.11)
RDW: 15.7 % — ABNORMAL HIGH (ref 11.5–15.5)
WBC: 4.2 10*3/uL (ref 4.0–10.5)
nRBC: 0 % (ref 0.0–0.2)

## 2021-06-01 LAB — COMPREHENSIVE METABOLIC PANEL
ALT: 13 U/L (ref 0–44)
AST: 18 U/L (ref 15–41)
Albumin: 4.2 g/dL (ref 3.5–5.0)
Alkaline Phosphatase: 46 U/L (ref 38–126)
Anion gap: 9 (ref 5–15)
BUN: 8 mg/dL (ref 8–23)
CO2: 25 mmol/L (ref 22–32)
Calcium: 9.1 mg/dL (ref 8.9–10.3)
Chloride: 104 mmol/L (ref 98–111)
Creatinine, Ser: 0.95 mg/dL (ref 0.44–1.00)
GFR, Estimated: >60 mL/min (ref >60)
Glucose, Bld: 114 mg/dL — ABNORMAL HIGH (ref 70–99)
Potassium: 3.4 mmol/L — ABNORMAL LOW (ref 3.5–5.1)
Sodium: 138 mmol/L (ref 135–145)
Total Bilirubin: 0.6 mg/dL (ref 0.3–1.2)
Total Protein: 7.2 g/dL (ref 6.5–8.1)

## 2021-06-02 LAB — CA 125: Cancer Antigen (CA) 125: 3.8 U/mL (ref 0.0–38.1)

## 2021-06-03 ENCOUNTER — Other Ambulatory Visit: Payer: Medicare HMO

## 2021-06-03 ENCOUNTER — Other Ambulatory Visit: Payer: Self-pay

## 2021-06-03 ENCOUNTER — Inpatient Hospital Stay (HOSPITAL_BASED_OUTPATIENT_CLINIC_OR_DEPARTMENT_OTHER): Payer: Medicare HMO | Admitting: Oncology

## 2021-06-03 ENCOUNTER — Encounter: Payer: Self-pay | Admitting: Oncology

## 2021-06-03 VITALS — BP 132/82 | HR 74 | Temp 97.6°F | Wt 222.0 lb

## 2021-06-03 DIAGNOSIS — G629 Polyneuropathy, unspecified: Secondary | ICD-10-CM

## 2021-06-03 DIAGNOSIS — D649 Anemia, unspecified: Secondary | ICD-10-CM

## 2021-06-03 DIAGNOSIS — T451X5A Adverse effect of antineoplastic and immunosuppressive drugs, initial encounter: Secondary | ICD-10-CM | POA: Diagnosis not present

## 2021-06-03 DIAGNOSIS — C763 Malignant neoplasm of pelvis: Secondary | ICD-10-CM

## 2021-06-03 DIAGNOSIS — Z87891 Personal history of nicotine dependence: Secondary | ICD-10-CM | POA: Diagnosis not present

## 2021-06-03 DIAGNOSIS — G620 Drug-induced polyneuropathy: Secondary | ICD-10-CM | POA: Diagnosis not present

## 2021-06-03 DIAGNOSIS — Z5111 Encounter for antineoplastic chemotherapy: Secondary | ICD-10-CM

## 2021-06-03 DIAGNOSIS — J910 Malignant pleural effusion: Secondary | ICD-10-CM | POA: Diagnosis not present

## 2021-06-03 DIAGNOSIS — R599 Enlarged lymph nodes, unspecified: Secondary | ICD-10-CM | POA: Diagnosis not present

## 2021-06-03 NOTE — Progress Notes (Signed)
Patient here for follow up. No new concerns voiced at this time.

## 2021-06-03 NOTE — Progress Notes (Signed)
Hematology/Oncology Follow Up Note [facility]  [phone] [phone]  Patient Care Team: Burnard Hawthorne, FNP as PCP - General (Family Medicine) Clent Jacks, RN as Oncology Nurse Navigator   Name of the patient: Linda Schmitt  [id number]  [birth year]   REASON FOR VISIT  follow-up for stage IV ovarian cancer,  PERTINENT ONCOLOGY HISTORY Linda Schmitt is a 65 y.o.afemale who has above oncology history reviewed by me today presented for follow up visit for management of malignant pleural effusion status post thoracentesis x2 during her recent admission. Cytology was positive for metastatic carcinoma.  TTF-1 Napsin A, GATA3, CDX2 were negative. Positive for PAX 8, which is most often expressing tumors of gynecological and renal origin. PET scan was independently reviewed by me and discussed with patient.  I also discussed with radiology. Exam showed evidence of peritoneal disease within the abdomen, soft tissue infiltrating into the omentum.  Large loculated left pleural effusion with thin FDG avid rind of soft tissue overlying the left lung.  Small right pleural effusion with mild FDG uptake is equivocal for malignant effusion of the right hemithorax. Fluid density structure within the right side of the pelvis abutting the right-sided of uterus is noted.  This is indeterminate.  Further investigation with pelvic sonogram may be helpful.  Pelvic ultrasound was obtained.-Heterogeneous myometrium with poor definition of endometrial complex. Normal left ovary.  No cystic right adnexal lesion is identified.  No a probable small normal-appearing right ovary is identified.  Suspect that the low-attenuation presumed cystic structure seen in the right adnexa on the prior image corresponds to a small amount of nonspecific free fluid in the right adnexa. I  discussed with gynecology oncology Dr.Secord.  Given that cytology from left pleural effusion is positive for metastatic carcinoma, tumor cells are positive for PAX 8 which indicates GYN origin.  Given that patient has moderate to large amount of pleural effusion, heavy tumor burden, recommend to start chemotherapy as soon as possible with carboplatin AUC 5-6 and Taxol 175 mg/m every 3 weeks for 3 cycles.   #Pre-existing neuropathy of left hand, she takes gabapentin. # seen by Dr. Theora Gianotti on 04/30/2020 and she recommends to proceed with the fourth cycle of chemotherapy and patient will be seen by Dr. Theora Gianotti again on 05/27/2020 for debulking surgery plan on 06/05/2020 with cardiothoracic surgery for thoracoscopy possible thoracic tumor debulking.  Patient was recommended to have to repeat CT of the chest after her chemotherapy.  Oncologic chemotherapy treatments 03/04/2020- 9/28/201 4 cycles of neoadjuvant carbo and Taxol  06/05/2020, status post Total hysterectomy and bilateral salpingo-oophorectomy, Omentectomy, and Optimal (R<1) interval tumor debulking. A. Peritoneal nodule, excisional biopsy: Fibroadipose tissue with calcifications, negative for tumor. B. Uterus, bilateral ovaries and fallopian tubes, hysterectomy and bilateral salpingo-oophorectomy: Cervix: Squamous metaplasia. Endometrium: Inactive endometrium. Myometrium: Adenomyosis. Serosa: High grade serous adenocarcinoma. Right ovary: High grade serous adenocarcinoma, involving the ovarian surface and parenchyma. Right fallopian tube: No specific pathologic change. Left ovary: High grade serous adenocarcinoma, involving the ovarian surface and parenchyma. Left fallopian tube: High grade serous adenocarcinoma, involving tubal fimbria. Serous tubal intraepithelial carcinoma. Immunohistochemistry was performed on block B14 at [facility] to characterize the pathologic process and demonstrates the following immunophenotype in the cells of  interest:POSITIVE:  p53, p16 (strong, patchy), Ki67 (elevated) This staining profile supports the above diagnosis. C. Peritoneal nodule, left pericolic gutter, excisional biopsy: High grade serous adenocarcinoma. D. Cul-de-sac, excisional biopsy: High grade serous adenocarcinoma. E. Omentum, excision: High  grade serous adenocarcinoma (up to 3.0 cm).  TUMOR   Tumor Site:    Left fallopian tube   Histologic Type:    Serous carcinoma   Histologic Grade:    High grade   Tumor Size:    Greatest Dimension (Centimeters): 0.2 cm   Ovarian Surface Involvement:    Present     Laterality:    Bilateral   Fallopian Tube Surface Involvement:    Present     Laterality:    Left   Implants:    Present (sites): left peritoneum, cul-de-sac, omentum   Other Tissue / Organ Involvement:    Right ovary   Other Tissue / Organ Involvement:    Left ovary   Other Tissue / Organ Involvement:    Left fallopian tube   Other Tissue / Organ Involvement:    Pelvic peritoneum   Other Tissue / Organ Involvement:    Omentum   Largest Extrapelvic Peritoneal Focus:    Macroscopic (greater than 2 cm)   Peritoneal / Ascitic Fluid:    Not submitted / unknown   Pleural Fluid:    Not submitted / unknown   Treatment Effect:    Moderate response identified (CRS 2)   LYMPH NODES   Regional Lymph Nodes:    No lymph nodes submitted or found   PATHOLOGIC STAGE CLASSIFICATION (pTNM, AJCC 8th Edition)   TNM Descriptors:    y (post-treatment)   Primary Tumor (pT):    pT3c   Regional Lymph Nodes (pN):    pNX  FIGO Stage:    IIIC   + pleural effusion so STAGE IV No germline BRCA mutation, -Homologous recombination deficiency positive.  Myriad Genomic instability score:  positive, somatic BRCA1 positive  07/01/20- 08/12/2020 3 cycles of adjuvant chemotherapy with carboplatin AUC 5, Taxol 175 mg/m. 09/02/2020, started on olaparib 300 mg twice daily.  March 2022 followed up with gyn and  At that time patient had some suprapubic  pressure and pain of unclear etiology.  A CT chest abdomen pelvis was obtained to evaluate further. 11/14/2020 CT chest abdomen pelvis showed no residual suspicious peritoneal nodularity identified.  Unchanged small nodules adjacent to the spleen which are stable since 2017.  Interval resolution of previously seen moderate left pleural effusion.  Minimal residual pleural thickening.  03/18/2021, CT showed no obvious cancer metastasis or recurrence. There are borderline enlarged mediastinal lymph node and a right hilar node.  Attention on follow-up  INTERVAL HISTORY 65 year old female presents for follow-up of stage IV ovarian cancer,  somatic BRCA1 positive and HRD positive.  Patient has been on olaparib since January 2022.   Patient reports feeling well.  No new complaints.  Denies any nausea vomiting diarrhea. Chronic neuropathy symptoms are stable.  She takes gabapentin and nortriptyline  Review of Systems  Constitutional:  Negative for appetite change, chills, fatigue and fever.  HENT:   Negative for hearing loss and voice change.   Eyes:  Negative for eye problems.  Respiratory:  Negative for chest tightness and cough.   Cardiovascular:  Negative for chest pain.  Gastrointestinal:  Negative for abdominal distention, abdominal pain and blood in stool.  Endocrine: Negative for hot flashes.  Genitourinary:  Negative for difficulty urinating and frequency.   Musculoskeletal:  Negative for arthralgias.  Skin:  Negative for itching and rash.  Neurological:  Positive for numbness. Negative for extremity weakness.  Hematological:  Negative for adenopathy.  Psychiatric/Behavioral:  Positive for sleep disturbance. Negative for confusion.  No Known Allergies   Past Medical History:  Diagnosis Date   Cancer ([place])    ovarian cancer   Family history of breast cancer    Genetic testing 04/09/2020   No pathogenic variants identified on the Myriad MyRisk (germline) panel. The report date  is 04/07/2020.   The [facility] gene panel offered by [employer] includes sequencing and deletion/duplication testing of the following 35 genes: APC, ATM, AXIN2, BARD1, BMPR1A, BRCA1, BRCA2, BRIP1, CHD1, CDK4, CDKN2A, CHEK2, EPCAM (large rearrangement only), HOXB13, GALNT12, MLH1, MSH2, MSH3, MSH6   HLD (hyperlipidemia)      Past Surgical History:  Procedure Laterality Date   CARPAL TUNNEL RELEASE Left    COLONOSCOPY WITH PROPOFOL N/A 12/10/2020   Procedure: COLONOSCOPY WITH PROPOFOL;  Surgeon: Virgel Manifold, MD;  Location: [facility] ENDOSCOPY;  Service: Endoscopy;  Laterality: N/A;   ROBOTIC ASSISTED TOTAL HYSTERECTOMY WITH BILATERAL SALPINGO OOPHERECTOMY  06/05/2020   with omentectomy tumor debulking via mini-laparotomy/hand assisted port   SHOULDER ARTHROSCOPY WITH SUBACROMIAL DECOMPRESSION AND OPEN ROTATOR C Right 02/01/2020   Procedure: RIGHT SHOULDER ARTHROSCOPIC SUBSCAPULARIS REPAIR, ROTATOR CUFF REPAIR, SUABCROMIAL DECOMPRESSION, AND BICEPS TENODESIS.;  Surgeon: Leim Fabry, MD;  Location: [facility] ORS;  Service: Orthopedics;  Laterality: Right;   TUBAL LIGATION      Social History   Socioeconomic History   Marital status: Single    Spouse name: Not on file   Number of children: Not on file   Years of education: Not on file   Highest education level: Not on file  Occupational History   Not on file  Tobacco Use   Smoking status: Former    Packs/day: 0.75    Years: 40.00    Pack years: 30.00    Types: Cigarettes    Quit date: 02/06/2018    Years since quitting: 3.3   Smokeless tobacco: Never   Tobacco comments:    quit 02/2018  Vaping Use   Vaping Use: Never used  Substance and Sexual Activity   Alcohol use: Yes    Comment: 2-3 glasses of wine per week,none last 24hrs   Drug use: No   Sexual activity: Not on file  Other Topics Concern   Not on file  Social History Narrative   Lives in [place] alone. Work - [employer]   Diet: Regular   Exercise: walking    Social Determinants of Radio broadcast assistant Strain: Not on [employer] Insecurity: Not on file  Transportation Needs: Not on file  Physical Activity: Not on file  Stress: Not on file  Social Connections: Not on file  Intimate Partner Violence: Not on file    Family History  Problem Relation Age of Onset   Cancer Mother        lymphoma   Stroke Sister    Cancer Brother        lung   Cancer Sister        lung   Lung cancer Brother    Bone cancer Sister    Breast cancer Cousin 40       maternal   Cancer Maternal Aunt        unk types     Current Outpatient Medications:    cholecalciferol (VITAMIN D3) 10 MCG (400 UNIT) TABS tablet, Take 800 Units by mouth daily., Disp: , Rfl:    cyanocobalamin 1000 MCG tablet, Take 400 mcg by mouth daily., Disp: , Rfl:    meloxicam (MOBIC) 15 MG tablet, Take 1 tablet by mouth  daily., Disp: , Rfl:    nortriptyline (PAMELOR) 10 MG capsule, TAKE 1 CAPSULE BY MOUTH EVERYDAY AT BEDTIME, Disp: 90 capsule, Rfl: 1   olaparib (LYNPARZA) 150 MG tablet, Take 2 tablets (300 mg total) by mouth 2 (two) times daily. Swallow whole. May take with food to decrease nausea and vomiting., Disp: 120 tablet, Rfl: 2   pravastatin (PRAVACHOL) 40 MG tablet, Take 1 tablet (40 mg total) by mouth every evening., Disp: 90 tablet, Rfl: 3   clotrimazole (GYNE-LOTRIMIN) 1 % vaginal cream, Place 1 Applicatorful vaginally at bedtime. (Patient not taking: No sig reported), Disp: 45 g, Rfl: 0   gabapentin (NEURONTIN) 300 MG capsule, Take 1 capsule (300 mg total) by mouth 2 (two) times daily. (Patient not taking: No sig reported), Disp: 60 capsule, Rfl: 2  Physical exam:  Vitals:   06/03/21 1341  BP: 132/82  Pulse: 74  Temp: 97.6 F (36.4 C)  TempSrc: Tympanic  SpO2: 100%  Weight: 222 lb (100.7 kg)   Physical Exam Constitutional:      General: She is not in acute distress.    Appearance: She is not diaphoretic.  HENT:     Head: Normocephalic and atraumatic.      Nose: Nose normal.     Mouth/Throat:     Pharynx: No oropharyngeal exudate.  Eyes:     General: No scleral icterus.    Pupils: Pupils are equal, round, and reactive to light.  Cardiovascular:     Rate and Rhythm: Normal rate and regular rhythm.     Heart sounds: No murmur heard. Pulmonary:     Effort: Pulmonary effort is normal. No respiratory distress.     Breath sounds: No rales.  Chest:     Chest wall: No tenderness.  Abdominal:     General: There is no distension.     Palpations: Abdomen is soft.     Tenderness: There is no abdominal tenderness.  Musculoskeletal:        General: Normal range of motion.     Cervical back: Normal range of motion and neck supple.  Skin:    General: Skin is warm and dry.     Findings: No erythema.  Neurological:     Mental Status: She is alert and oriented to person, place, and time.     Cranial Nerves: No cranial nerve deficit.     Motor: No abnormal muscle tone.     Coordination: Coordination normal.  Psychiatric:        Mood and Affect: Affect normal.         CMP Latest Ref Rng & Units 06/01/2021  Glucose 70 - 99 mg/dL 114(H)  BUN 8 - 23 mg/dL 8  Creatinine 0.44 - 1.00 mg/dL 0.95  Sodium 135 - 145 mmol/L 138  Potassium 3.5 - 5.1 mmol/L 3.4(L)  Chloride 98 - 111 mmol/L 104  CO2 22 - 32 mmol/L 25  Calcium 8.9 - 10.3 mg/dL 9.1  Total Protein 6.5 - 8.1 g/dL 7.2  Total Bilirubin 0.3 - 1.2 mg/dL 0.6  Alkaline Phos 38 - 126 U/L 46  AST 15 - 41 U/L 18  ALT 0 - 44 U/L 13   CBC Latest Ref Rng & Units 06/01/2021  WBC 4.0 - 10.5 K/uL 4.2  Hemoglobin 12.0 - 15.0 g/dL 10.9(L)  Hematocrit 36.0 - 46.0 % 32.5(L)  Platelets 150 - 400 K/uL 179    RADIOGRAPHIC STUDIES: I have personally reviewed the radiological images as listed and agreed with the findings in  the report. CT CHEST ABDOMEN PELVIS W CONTRAST  Result Date: 03/18/2021 CLINICAL DATA:  Ovarian cancer follow-up. Fallopian tube cancer diagnosed July 2021. Chemotherapy and  surgery with hysterectomy and bilateral salpingectomy. EXAM: CT CHEST, ABDOMEN, AND PELVIS WITH CONTRAST TECHNIQUE: Multidetector CT imaging of the chest, abdomen and pelvis was performed following the standard protocol during bolus administration of intravenous contrast. CONTRAST:  166m OMNIPAQUE IOHEXOL 350 MG/ML SOLN COMPARISON:  CT 11/14/2020 FINDINGS: CT CHEST FINDINGS Cardiovascular: No significant vascular findings. Normal heart size. No pericardial effusion. Mediastinum/Nodes: Small lymph node in the anterior mediastinum measuring 8 mm (image 19/2) is unchanged from 7 mm on CT 05/20/2020. Additional paratracheal nodes are unchanged. RIGHT hilar node measuring 10 mm (image 25/2) compares to 9 mm on most recent CT scan. Lungs/Pleura: No suspicious pulmonary nodule.  No pleural fluid. Musculoskeletal: No aggressive osseous lesion. CT ABDOMEN AND PELVIS FINDINGS Hepatobiliary: No focal hepatic lesion. No biliary ductal dilatation. Gallbladder is normal. Common bile duct is normal. Pancreas: Pancreas is normal. No ductal dilatation. No pancreatic inflammation. Spleen: Normal spleen Adrenals/urinary tract: Nodule of the LEFT adrenal gland measuring 14 mm unchanged. Kidneys, ureters and bladder normal Stomach/Bowel: Stomach, small bowel, appendix, and cecum are normal. The colon and rectosigmoid colon are normal. Vascular/Lymphatic: Abdominal aorta is normal caliber. There is no retroperitoneal or periportal lymphadenopathy. No pelvic lymphadenopathy. Reproductive: Post hysterectomy.  Adnexa unremarkable Other: No ventral peritoneal nodularity or omental nodularity. Multiple small Peri splenic nodules are unchanged and likely represent small splenules. Musculoskeletal: No aggressive osseous lesion. IMPRESSION: Chest Impression: 1. No evidence metastatic disease in the thorax. 2. Stable borderline enlarged mediastinal lymph nodes and RIGHT hilar node. Recommend attention on follow-up. Abdomen / Pelvis Impression:  1. No evidence of peritoneal or omental nodal metastatic recurrence. 2. No evidence of local recurrence in the pelvis. 3. No free fluid in the abdomen pelvis. Electronically Signed   By: SSuzy BouchardM.D.   On: 03/18/2021 21:24      Assessment and plan Patient is a 65y.o. female presents for follow-up for FIGO IIIC ovarian cancer, ( somatic BRCA1 positive, HRD positive). 1. Ser[place] of [place] pelvis [place]   2. Neuropathy   3. Normocytic anemia   4. Encounter for antineoplastic chemotherapy    #FIGO IVA Fallopian tube serous carcinoma- + pleural effusion cytology S/p 4 cycles of neoadjuvant chemotherapy  Carboplatin /Taxol  S/p total hysterectomy with salpingo-oophorectomy and omentectomy. Optimal (R<1) interval tumor debulking. S/p 3 cycles of adjuvant carbopltain/ Taxol  +HR  patient is clinically doing well.  CA-125 3.8, stable.  Labs reviewed and discussed with patient.  Continue olaparib 300 mg twice daily. I will obtain CT chest abdomen pelvis prior to next visit.  borderline enlarged mediastinal lymph node and a right hilar node, attention on follow up  #chemotherapy induced neuropathy, on gabapentin 600 mg twice daily plus another 300 mg midday as needed.  Continue nortriptyline 120mQHS.  She has been referred previously to acupuncture clinic.  Continue current regimen. . Marland KitchenAnemia,   Hemoglobin is 10.9.  Macrocytosis likely due to olaparib.  monitor Patient has normal B12 and folate. This may be secondary to olaparib side effects.   We spent sufficient time to discuss many aspect of care, questions were answered to patient's satisfaction.  Follow-up in 6 weeks   ZhEarlie ServerMD, PhD Hematology Oncology [facility]10/26/2022

## 2021-06-04 ENCOUNTER — Telehealth: Payer: Self-pay

## 2021-06-04 DIAGNOSIS — C763 Malignant neoplasm of pelvis: Secondary | ICD-10-CM

## 2021-06-04 NOTE — Telephone Encounter (Signed)
-----   Message from Earlie Server, MD sent at 06/03/2021  8:15 PM EDT ----- Please arrange patient to have CT chest abdomen pelvis with contrast a few days prior to next visit.  She knows about the plan.  Thanks

## 2021-06-04 NOTE — Telephone Encounter (Signed)
Please schedule CT prior to next visit (at least 3 days prior) and notify pt of appt. Thanks

## 2021-06-19 ENCOUNTER — Telehealth: Payer: Self-pay

## 2021-06-19 ENCOUNTER — Ambulatory Visit: Payer: Medicare HMO

## 2021-06-19 NOTE — Telephone Encounter (Signed)
Unsuccessful attempt to reach patient for scheduled AWV. Left message to call office back and reschedule.

## 2021-07-08 ENCOUNTER — Other Ambulatory Visit: Payer: Self-pay | Admitting: *Deleted

## 2021-07-08 DIAGNOSIS — C763 Malignant neoplasm of pelvis: Secondary | ICD-10-CM

## 2021-07-13 ENCOUNTER — Inpatient Hospital Stay: Payer: Medicare HMO | Attending: Oncology

## 2021-07-13 ENCOUNTER — Other Ambulatory Visit: Payer: Self-pay

## 2021-07-13 DIAGNOSIS — T451X5A Adverse effect of antineoplastic and immunosuppressive drugs, initial encounter: Secondary | ICD-10-CM | POA: Insufficient documentation

## 2021-07-13 DIAGNOSIS — G620 Drug-induced polyneuropathy: Secondary | ICD-10-CM | POA: Diagnosis not present

## 2021-07-13 DIAGNOSIS — D649 Anemia, unspecified: Secondary | ICD-10-CM | POA: Diagnosis not present

## 2021-07-13 DIAGNOSIS — C563 Malignant neoplasm of bilateral ovaries: Secondary | ICD-10-CM | POA: Insufficient documentation

## 2021-07-13 DIAGNOSIS — C763 Malignant neoplasm of pelvis: Secondary | ICD-10-CM

## 2021-07-13 LAB — COMPREHENSIVE METABOLIC PANEL
ALT: 16 U/L (ref 0–44)
AST: 22 U/L (ref 15–41)
Albumin: 4.2 g/dL (ref 3.5–5.0)
Alkaline Phosphatase: 52 U/L (ref 38–126)
Anion gap: 12 (ref 5–15)
BUN: 12 mg/dL (ref 8–23)
CO2: 24 mmol/L (ref 22–32)
Calcium: 9.1 mg/dL (ref 8.9–10.3)
Chloride: 101 mmol/L (ref 98–111)
Creatinine, Ser: 0.91 mg/dL (ref 0.44–1.00)
GFR, Estimated: >60 mL/min (ref >60)
Glucose, Bld: 151 mg/dL — ABNORMAL HIGH (ref 70–99)
Potassium: 3.5 mmol/L (ref 3.5–5.1)
Sodium: 137 mmol/L (ref 135–145)
Total Bilirubin: 0.5 mg/dL (ref 0.3–1.2)
Total Protein: 7.3 g/dL (ref 6.5–8.1)

## 2021-07-13 LAB — CBC WITH DIFFERENTIAL/PLATELET
Abs Immature Granulocytes: 0.01 10*3/uL (ref 0.00–0.07)
Basophils Absolute: 0 10*3/uL (ref 0.0–0.1)
Basophils Relative: 1 %
Eosinophils Absolute: 0.1 10*3/uL (ref 0.0–0.5)
Eosinophils Relative: 2 %
HCT: 35.8 % — ABNORMAL LOW (ref 36.0–46.0)
Hemoglobin: 11.9 g/dL — ABNORMAL LOW (ref 12.0–15.0)
Immature Granulocytes: 0 %
Lymphocytes Relative: 29 %
Lymphs Abs: 1.3 10*3/uL (ref 0.7–4.0)
MCH: 34.8 pg — ABNORMAL HIGH (ref 26.0–34.0)
MCHC: 33.2 g/dL (ref 30.0–36.0)
MCV: 104.7 fL — ABNORMAL HIGH (ref 80.0–100.0)
Monocytes Absolute: 0.4 10*3/uL (ref 0.1–1.0)
Monocytes Relative: 8 %
Neutro Abs: 2.6 10*3/uL (ref 1.7–7.7)
Neutrophils Relative %: 60 %
Platelets: 169 10*3/uL (ref 150–400)
RBC: 3.42 MIL/uL — ABNORMAL LOW (ref 3.87–5.11)
RDW: 15 % (ref 11.5–15.5)
WBC: 4.3 10*3/uL (ref 4.0–10.5)
nRBC: 0 % (ref 0.0–0.2)

## 2021-07-14 ENCOUNTER — Encounter: Payer: Self-pay | Admitting: Oncology

## 2021-07-14 ENCOUNTER — Ambulatory Visit
Admission: RE | Admit: 2021-07-14 | Discharge: 2021-07-14 | Disposition: A | Discharge status: Home / Self Care | Payer: Medicare HMO | Source: Ambulatory Visit | Attending: Oncology | Admitting: Oncology

## 2021-07-14 DIAGNOSIS — K449 Diaphragmatic hernia without obstruction or gangrene: Secondary | ICD-10-CM | POA: Diagnosis not present

## 2021-07-14 DIAGNOSIS — M47814 Spondylosis without myelopathy or radiculopathy, thoracic region: Secondary | ICD-10-CM | POA: Diagnosis not present

## 2021-07-14 DIAGNOSIS — I700 Atherosclerosis of aorta: Secondary | ICD-10-CM | POA: Diagnosis not present

## 2021-07-14 DIAGNOSIS — C763 Malignant neoplasm of pelvis: Secondary | ICD-10-CM | POA: Insufficient documentation

## 2021-07-14 DIAGNOSIS — Z8543 Personal history of malignant neoplasm of ovary: Secondary | ICD-10-CM | POA: Diagnosis not present

## 2021-07-14 DIAGNOSIS — D3502 Benign neoplasm of left adrenal gland: Secondary | ICD-10-CM | POA: Diagnosis not present

## 2021-07-14 LAB — CA 125: Cancer Antigen (CA) 125: 4.1 U/mL (ref 0.0–38.1)

## 2021-07-14 MED ORDER — IOHEXOL 300 MG/ML  SOLN
100.0000 mL | Freq: Once | INTRAMUSCULAR | Status: AC | PRN
  Administered 2021-07-14: 100 mL via INTRAVENOUS

## 2021-07-16 ENCOUNTER — Inpatient Hospital Stay (HOSPITAL_BASED_OUTPATIENT_CLINIC_OR_DEPARTMENT_OTHER): Payer: Medicare HMO | Admitting: Oncology

## 2021-07-16 ENCOUNTER — Encounter: Payer: Self-pay | Admitting: Oncology

## 2021-07-16 ENCOUNTER — Other Ambulatory Visit: Payer: Self-pay

## 2021-07-16 VITALS — BP 120/74 | HR 72 | Temp 97.4°F | Resp 18 | Wt 220.8 lb

## 2021-07-16 DIAGNOSIS — T451X5A Adverse effect of antineoplastic and immunosuppressive drugs, initial encounter: Secondary | ICD-10-CM | POA: Diagnosis not present

## 2021-07-16 DIAGNOSIS — Z5111 Encounter for antineoplastic chemotherapy: Secondary | ICD-10-CM

## 2021-07-16 DIAGNOSIS — G629 Polyneuropathy, unspecified: Secondary | ICD-10-CM | POA: Diagnosis not present

## 2021-07-16 DIAGNOSIS — G620 Drug-induced polyneuropathy: Secondary | ICD-10-CM | POA: Diagnosis not present

## 2021-07-16 DIAGNOSIS — D649 Anemia, unspecified: Secondary | ICD-10-CM | POA: Diagnosis not present

## 2021-07-16 DIAGNOSIS — C563 Malignant neoplasm of bilateral ovaries: Secondary | ICD-10-CM | POA: Diagnosis not present

## 2021-07-16 DIAGNOSIS — C763 Malignant neoplasm of pelvis: Secondary | ICD-10-CM

## 2021-07-16 NOTE — Progress Notes (Signed)
Pt here for follow up. No new concerns voiced.   

## 2021-07-16 NOTE — Progress Notes (Signed)
Hematology/Oncology Follow Up Note  [phone] [phone]  Patient Care Team: Burnard Hawthorne, FNP as PCP - General (Family Medicine) Clent Jacks, RN as Oncology Nurse Navigator   Name of the patient: Linda Schmitt  [id number]  [birth year]   REASON FOR VISIT  follow-up for stage IV ovarian cancer,  PERTINENT ONCOLOGY HISTORY Linda Schmitt is a 65 y.o.afemale who has above oncology history reviewed by me today presented for follow up visit for management of malignant pleural effusion status post thoracentesis x2 during her recent admission. Cytology was positive for metastatic carcinoma.  TTF-1 Napsin A, GATA3, CDX2 were negative. Positive for PAX 8, which is most often expressing tumors of gynecological and renal origin. PET scan was independently reviewed by me and discussed with patient.  I also discussed with radiology. Exam showed evidence of peritoneal disease within the abdomen, soft tissue infiltrating into the omentum.  Large loculated left pleural effusion with thin FDG avid rind of soft tissue overlying the left lung.  Small right pleural effusion with mild FDG uptake is equivocal for malignant effusion of the right hemithorax. Fluid density structure within the right side of the pelvis abutting the right-sided of uterus is noted.  This is indeterminate.  Further investigation with pelvic sonogram may be helpful.  Pelvic ultrasound was obtained.-Heterogeneous myometrium with poor definition of endometrial complex. Normal left ovary.  No cystic right adnexal lesion is identified.  No a probable small normal-appearing right ovary is identified.  Suspect that the low-attenuation presumed cystic structure seen in the right adnexa on the prior image corresponds to a small amount of nonspecific free fluid in the right adnexa. I discussed with gynecology oncology  Dr.Secord.  Given that cytology from left pleural effusion is positive for metastatic carcinoma, tumor cells are positive for PAX 8 which indicates GYN origin.  Given that patient has moderate to large amount of pleural effusion, heavy tumor burden, recommend to start chemotherapy as soon as possible with carboplatin AUC 5-6 and Taxol 175 mg/m every 3 weeks for 3 cycles.   #Pre-existing neuropathy of left hand, she takes gabapentin. # seen by Dr. Theora Gianotti on 04/30/2020 and she recommends to proceed with the fourth cycle of chemotherapy and patient will be seen by Dr. Theora Gianotti again on 05/27/2020 for debulking surgery plan on 06/05/2020 with cardiothoracic surgery for thoracoscopy possible thoracic tumor debulking.  Patient was recommended to have to repeat CT of the chest after her chemotherapy.  Oncologic chemotherapy treatments 03/04/2020- 9/28/201 4 cycles of neoadjuvant carbo and Taxol  06/05/2020, status post Total hysterectomy and bilateral salpingo-oophorectomy, Omentectomy, and Optimal (R<1) interval tumor debulking. A. Peritoneal nodule, excisional biopsy: Fibroadipose tissue with calcifications, negative for tumor. B. Uterus, bilateral ovaries and fallopian tubes, hysterectomy and bilateral salpingo-oophorectomy: Cervix: Squamous metaplasia. Endometrium: Inactive endometrium. Myometrium: Adenomyosis. Serosa: High grade serous adenocarcinoma. Right ovary: High grade serous adenocarcinoma, involving the ovarian surface and parenchyma. Right fallopian tube: No specific pathologic change. Left ovary: High grade serous adenocarcinoma, involving the ovarian surface and parenchyma. Left fallopian tube: High grade serous adenocarcinoma, involving tubal fimbria. Serous tubal intraepithelial carcinoma. Immunohistochemistry was performed on block B14 at [facility] to characterize the pathologic process and demonstrates the following immunophenotype in the cells of interest:POSITIVE:  p53, p16  (strong, patchy), Ki67 (elevated) This staining profile supports the above diagnosis. C. Peritoneal nodule, left pericolic gutter, excisional biopsy: High grade serous adenocarcinoma. D. Cul-de-sac, excisional biopsy: High grade serous adenocarcinoma. E. Omentum, excision: High grade serous adenocarcinoma (up  to 3.0 cm).  TUMOR   Tumor Site:    Left fallopian tube   Histologic Type:    Serous carcinoma   Histologic Grade:    High grade   Tumor Size:    Greatest Dimension (Centimeters): 0.2 cm   Ovarian Surface Involvement:    Present     Laterality:    Bilateral   Fallopian Tube Surface Involvement:    Present     Laterality:    Left   Implants:    Present (sites): left peritoneum, cul-de-sac, omentum   Other Tissue / Organ Involvement:    Right ovary   Other Tissue / Organ Involvement:    Left ovary   Other Tissue / Organ Involvement:    Left fallopian tube   Other Tissue / Organ Involvement:    Pelvic peritoneum   Other Tissue / Organ Involvement:    Omentum   Largest Extrapelvic Peritoneal Focus:    Macroscopic (greater than 2 cm)   Peritoneal / Ascitic Fluid:    Not submitted / unknown   Pleural Fluid:    Not submitted / unknown   Treatment Effect:    Moderate response identified (CRS 2)   LYMPH NODES   Regional Lymph Nodes:    No lymph nodes submitted or found   PATHOLOGIC STAGE CLASSIFICATION (pTNM, AJCC 8th Edition)   TNM Descriptors:    y (post-treatment)   Primary Tumor (pT):    pT3c   Regional Lymph Nodes (pN):    pNX  FIGO Stage:    IIIC   + pleural effusion so STAGE IV No germline BRCA mutation, -Homologous recombination deficiency positive.  Myriad Genomic instability score:  positive, somatic BRCA1 positive  07/01/20- 08/12/2020 3 cycles of adjuvant chemotherapy with carboplatin AUC 5, Taxol 175 mg/m. 09/02/2020, started on olaparib 300 mg twice daily.  March 2022 followed up with gyn and  At that time patient had some suprapubic pressure and pain of unclear  etiology.  A CT chest abdomen pelvis was obtained to evaluate further. 11/14/2020 CT chest abdomen pelvis showed no residual suspicious peritoneal nodularity identified.  Unchanged small nodules adjacent to the spleen which are stable since 2017.  Interval resolution of previously seen moderate left pleural effusion.  Minimal residual pleural thickening.  03/18/2021, CT showed no obvious cancer metastasis or recurrence. There are borderline enlarged mediastinal lymph node and a right hilar node.  Attention on follow-up  INTERVAL HISTORY 65 year old female presents for follow-up of stage IV ovarian cancer,  somatic BRCA1 positive and HRD positive.  Patient has been on olaparib since January 2022.   Overall patient's been doing very well.  No new complaints.  No nausea vomiting diarrhea. Chronic neuropathy symptoms are stable.   Review of Systems  Constitutional:  Negative for appetite change, chills, fatigue and fever.  HENT:   Negative for hearing loss and voice change.   Eyes:  Negative for eye problems.  Respiratory:  Negative for chest tightness and cough.   Cardiovascular:  Negative for chest pain.  Gastrointestinal:  Negative for abdominal distention, abdominal pain and blood in stool.  Endocrine: Negative for hot flashes.  Genitourinary:  Negative for difficulty urinating and frequency.   Musculoskeletal:  Negative for arthralgias.  Skin:  Negative for itching and rash.  Neurological:  Positive for numbness. Negative for extremity weakness.  Hematological:  Negative for adenopathy.  Psychiatric/Behavioral:  Positive for sleep disturbance. Negative for confusion.        No Known Allergies   Past  Medical History:  Diagnosis Date   Cancer [facility])    ovarian cancer   Family history of breast cancer    Genetic testing 04/09/2020   No pathogenic variants identified on the Myriad MyRisk (germline) panel. The report date is 04/07/2020.   The [facility] gene panel offered by [employer] includes sequencing and deletion/duplication testing of the following 35 genes: APC, ATM, AXIN2, BARD1, BMPR1A, BRCA1, BRCA2, BRIP1, CHD1, CDK4, CDKN2A, CHEK2, EPCAM (large rearrangement only), HOXB13, GALNT12, MLH1, MSH2, MSH3, MSH6   HLD (hyperlipidemia)      Past Surgical History:  Procedure Laterality Date   CARPAL TUNNEL RELEASE Left    COLONOSCOPY WITH PROPOFOL N/A 12/10/2020   Procedure: COLONOSCOPY WITH PROPOFOL;  Surgeon: Virgel Manifold, MD;  Location: [facility] ENDOSCOPY;  Service: Endoscopy;  Laterality: N/A;   ROBOTIC ASSISTED TOTAL HYSTERECTOMY WITH BILATERAL SALPINGO OOPHERECTOMY  06/05/2020   with omentectomy tumor debulking via mini-laparotomy/hand assisted port   SHOULDER ARTHROSCOPY WITH SUBACROMIAL DECOMPRESSION AND OPEN ROTATOR C Right 02/01/2020   Procedure: RIGHT SHOULDER ARTHROSCOPIC SUBSCAPULARIS REPAIR, ROTATOR CUFF REPAIR, SUABCROMIAL DECOMPRESSION, AND BICEPS TENODESIS.;  Surgeon: Leim Fabry, MD;  Location: [facility] ORS;  Service: Orthopedics;  Laterality: Right;   TUBAL LIGATION      Social History   Socioeconomic History   Marital status: Single    Spouse name: Not on file   Number of children: Not on file   Years of education: Not on file   Highest education level: Not on file  Occupational History   Not on file  Tobacco Use   Smoking status: Former    Packs/day: 0.75    Years: 40.00    Pack years: 30.00    Types: Cigarettes    Quit date: 02/06/2018    Years since quitting: 3.4   Smokeless tobacco: Never   Tobacco comments:    quit 02/2018  Vaping Use   Vaping Use: Never used  Substance and Sexual Activity   Alcohol use: Yes    Comment: 2-3 glasses of wine per week,none last 24hrs   Drug use: No   Sexual activity: Not on file  Other Topics Concern   Not on file  Social History Narrative   Lives in [place] alone. Work - [employer]   Diet: Regular   Exercise: walking   Social Determinants of Radio broadcast assistant Strain:  Not on [employer] Insecurity: Not on file  Transportation Needs: Not on file  Physical Activity: Not on file  Stress: Not on file  Social Connections: Not on file  Intimate Partner Violence: Not on file    Family History  Problem Relation Age of Onset   Cancer Mother        lymphoma   Stroke Sister    Cancer Brother        lung   Cancer Sister        lung   Lung cancer Brother    Bone cancer Sister    Breast cancer Cousin 33       maternal   Cancer Maternal Aunt        unk types     Current Outpatient Medications:    cholecalciferol (VITAMIN D3) 10 MCG (400 UNIT) TABS tablet, Take 800 Units by mouth daily., Disp: , Rfl:    cyanocobalamin 1000 MCG tablet, Take 400 mcg by mouth daily., Disp: , Rfl:    gabapentin (NEURONTIN) 300 MG capsule, Take 1 capsule (300 mg total) by mouth 2 (two) times  daily., Disp: 60 capsule, Rfl: 2   meloxicam (MOBIC) 15 MG tablet, Take 1 tablet by mouth daily., Disp: , Rfl:    nortriptyline (PAMELOR) 10 MG capsule, TAKE 1 CAPSULE BY MOUTH EVERYDAY AT BEDTIME, Disp: 90 capsule, Rfl: 1   olaparib (LYNPARZA) 150 MG tablet, Take 2 tablets (300 mg total) by mouth 2 (two) times daily. Swallow whole. May take with food to decrease nausea and vomiting., Disp: 120 tablet, Rfl: 2   pravastatin (PRAVACHOL) 40 MG tablet, Take 1 tablet (40 mg total) by mouth every evening., Disp: 90 tablet, Rfl: 3   clotrimazole (GYNE-LOTRIMIN) 1 % vaginal cream, Place 1 Applicatorful vaginally at bedtime. (Patient not taking: Reported on 02/18/2021), Disp: 45 g, Rfl: 0  Physical exam:  Vitals:   07/16/21 1047  BP: 120/74  Pulse: 72  Resp: 18  Temp: (!) 97.4 F (36.3 C)  Weight: 220 lb 12.8 oz (100.2 kg)   Physical Exam Constitutional:      General: She is not in acute distress.    Appearance: She is not diaphoretic.  HENT:     Head: Normocephalic and atraumatic.     Nose: Nose normal.     Mouth/Throat:     Pharynx: No oropharyngeal exudate.  Eyes:     General: No  scleral icterus.    Pupils: Pupils are equal, round, and reactive to light.  Cardiovascular:     Rate and Rhythm: Normal rate and regular rhythm.     Heart sounds: No murmur heard. Pulmonary:     Effort: Pulmonary effort is normal. No respiratory distress.     Breath sounds: No rales.  Chest:     Chest wall: No tenderness.  Abdominal:     General: There is no distension.     Palpations: Abdomen is soft.     Tenderness: There is no abdominal tenderness.  Musculoskeletal:        General: Normal range of motion.     Cervical back: Normal range of motion and neck supple.  Skin:    General: Skin is warm and dry.     Findings: No erythema.  Neurological:     Mental Status: She is alert and oriented to person, place, and time.     Cranial Nerves: No cranial nerve deficit.     Motor: No abnormal muscle tone.     Coordination: Coordination normal.  Psychiatric:        Mood and Affect: Affect normal.         CMP Latest Ref Rng & Units 07/13/2021  Glucose 70 - 99 mg/dL 151(H)  BUN 8 - 23 mg/dL 12  Creatinine 0.44 - 1.00 mg/dL 0.91  Sodium 135 - 145 mmol/L 137  Potassium 3.5 - 5.1 mmol/L 3.5  Chloride 98 - 111 mmol/L 101  CO2 22 - 32 mmol/L 24  Calcium 8.9 - 10.3 mg/dL 9.1  Total Protein 6.5 - 8.1 g/dL 7.3  Total Bilirubin 0.3 - 1.2 mg/dL 0.5  Alkaline Phos 38 - 126 U/L 52  AST 15 - 41 U/L 22  ALT 0 - 44 U/L 16   CBC Latest Ref Rng & Units 07/13/2021  WBC 4.0 - 10.5 K/uL 4.3  Hemoglobin 12.0 - 15.0 g/dL 11.9(L)  Hematocrit 36.0 - 46.0 % 35.8(L)  Platelets 150 - 400 K/uL 169    RADIOGRAPHIC STUDIES: I have personally reviewed the radiological images as listed and agreed with the findings in the report. CT CHEST ABDOMEN PELVIS W CONTRAST  Result Date: 07/14/2021  CLINICAL DATA:  History of ovarian cancer status post hysterectomy, bilateral salpingo-oophorectomy and omentectomy. EXAM: CT CHEST, ABDOMEN, AND PELVIS WITH CONTRAST TECHNIQUE: Multidetector CT imaging of the  chest, abdomen and pelvis was performed following the standard protocol during bolus administration of intravenous contrast. CONTRAST:  160mL OMNIPAQUE IOHEXOL 300 MG/ML  SOLN COMPARISON:  Multiple priors including most recent CT March 18, 2021. FINDINGS: CT CHEST FINDINGS Cardiovascular: Aortic atherosclerosis without thoracic aortic aneurysm. No central pulmonary embolus on this nondedicated study. Normal size heart. No significant pericardial effusion/thickening. Mediastinum/Nodes: No supraclavicular adenopathy. No discrete thyroid nodularity. Small lymph node in the anterior mediastinum measures 6 mm on image 20/2, previously 8 mm but overall stable dating back to 05/20/2020. Additional paratracheal nodes are also unchanged. Right hilar lymph node measures 8 mm on image 26/2, previously 10 mm. Lungs/Pleura: No suspicious pulmonary nodules or masses. No focal airspace consolidation. No pleural effusion. No pneumothorax. Musculoskeletal: Mild thoracic spondylosis. No aggressive lytic or blastic lesion of bone. CT ABDOMEN PELVIS FINDINGS Hepatobiliary: No suspicious hepatic lesion. Gallbladder is unremarkable. No biliary ductal dilation. Pancreas: No pancreatic ductal dilation or evidence of acute inflammation. Spleen: Normal in size without focal abnormality. Adrenals/Urinary Tract: Stable 1.4 cm left adrenal nodule previously characterized as an adenoma on PET-CT February 25, 2020. Right adrenal glands unremarkable. No hydronephrosis. No solid enhancing renal mass. Urinary bladder is unremarkable for degree of distension. Stomach/Bowel: Radiopaque enteric contrast material traverses the splenic flexure. Tiny hiatal hernia otherwise the stomach is unremarkable. No pathologic dilation of small or large bowel. The appendix and terminal ileum appear normal. Moderate volume of formed stool throughout the colon suggestive of constipation. No suspicious colonic wall thickening or mass like lesions identified.  Vascular/Lymphatic: No abdominal aortic aneurysm. No pathologically enlarged abdominal or pelvic lymph nodes. Reproductive: Status post hysterectomy. No adnexal masses. Other: Left upper quadrant, perisplenic nodularity is unchanged, stable dating back to 2017 and likely reflecting small splenules. No new discrete peritoneal or omental nodularity. No abdominopelvic free fluid. Musculoskeletal: Multilevel degenerative changes spine. Unchanged mild L1 superior endplate deformity associated with a large Schmorl's node. No aggressive lytic or blastic lesion of bone. IMPRESSION: 1. No evidence of new or progressive disease within the chest, abdomen, or pelvis. 2. Stable borderline enlarged mediastinal and right hilar lymph nodes, nonspecific and possibly reactive. Recommend attention on follow-up imaging. 3. Moderate volume of formed stool throughout the colon suggestive of constipation. 4. Stable 1.4 cm left adrenal nodule previously characterized as an adenoma on PET-CT February 25, 2020. 5.  Aortic Atherosclerosis (ICD10-I70.0). Electronically Signed   By: Dahlia Bailiff M.D.   On: 07/14/2021 15:59      Assessment and plan Patient is a 65 y.o. female presents for follow-up for FIGO IIIC ovarian cancer, ( somatic BRCA1 positive, HRD positive). 1. 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 Encounter for antineoplastic chemotherapy   3. Neuropathy   4. Normocytic anemia    #FIGO IVA Fallopian tube serous carcinoma- + pleural effusion cytology S/p 4 cycles of neoadjuvant chemotherapy  Carboplatin /Taxol  S/p total hysterectomy with salpingo-oophorectomy and omentectomy. Optimal (R<1) interval tumor debulking. S/p 3 cycles of adjuvant carbopltain/ Taxol   patient is clinically doing well.  CA-125 4.1, stable.  Labs reviewed and discussed with patient.  Continue olaparib 300 mg twice daily. 07/14/2021 CT chest abdomen pelvis with contrast showed no evidence of recurrent or metastatic disease.  Borderline  enlarged mediastinal lymph node and hilar lymphadenopathy, attention on follow-up.  Stable.  #chemotherapy induced  neuropathy, on gabapentin 600 mg twice daily plus another 300 mg midday as needed.  Continue nortriptyline 71m QHS.  She has been referred previously to acupuncture clinic.  Continue current regimen. .Marland Kitchen#Anemia,   Hemoglobin is 10.9.  Macrocytosis likely due to olaparib.  Monitor, previous work-up showed normal B12 and folate.  Likely sick due to olaparib.  We spent sufficient time to discuss many aspect of care, questions were answered to patient's satisfaction.  Follow-up in 6 weeks   ZEarlie Server MD, PhD  07/16/2021

## 2021-07-20 ENCOUNTER — Ambulatory Visit: Payer: Medicare HMO | Admitting: Family

## 2021-07-20 ENCOUNTER — Other Ambulatory Visit: Payer: Self-pay

## 2021-07-20 ENCOUNTER — Telehealth (INDEPENDENT_AMBULATORY_CARE_PROVIDER_SITE_OTHER): Payer: Medicare HMO | Admitting: Family

## 2021-07-20 ENCOUNTER — Encounter: Payer: Self-pay | Admitting: Family

## 2021-07-20 VITALS — Ht 63.0 in | Wt 220.0 lb

## 2021-07-20 DIAGNOSIS — E785 Hyperlipidemia, unspecified: Secondary | ICD-10-CM | POA: Diagnosis not present

## 2021-07-20 DIAGNOSIS — I700 Atherosclerosis of aorta: Secondary | ICD-10-CM

## 2021-07-20 DIAGNOSIS — Z1231 Encounter for screening mammogram for malignant neoplasm of breast: Secondary | ICD-10-CM

## 2021-07-20 DIAGNOSIS — G629 Polyneuropathy, unspecified: Secondary | ICD-10-CM

## 2021-07-20 DIAGNOSIS — Z13820 Encounter for screening for osteoporosis: Secondary | ICD-10-CM

## 2021-07-20 DIAGNOSIS — Z87898 Personal history of other specified conditions: Secondary | ICD-10-CM

## 2021-07-20 MED ORDER — NORTRIPTYLINE HCL 10 MG PO CAPS: 20.0000 mg | ORAL_CAPSULE | Freq: Every day | ORAL | 0 refills | Status: DC

## 2021-07-20 NOTE — Patient Instructions (Addendum)
Increase nortriptyline to 20mg  taken at night.  Call to schedule cholesterol lab when you can.   Nice to see you!

## 2021-07-20 NOTE — Progress Notes (Signed)
Virtual Visit via Video Note  I connected with@  on 07/21/21 at  9:00 AM EST by a video enabled telemedicine application and verified that I am speaking with the correct person using two identifiers.  Location patient: home Location provider:work  Persons participating in the virtual visit: patient, provider  I discussed the limitations of evaluation and management by telemedicine and the availability of in person appointments. The patient expressed understanding and agreed to proceed.   HPI: She feels well today She is most bothered by peripheral neuropathy. No low back pain.   Hyperlipidemia-she has not been compliant with Pravachol 40 mg  she continues to follow with oncology, Dr. Tasia Catchings for serous carcinoma pelvis with ongoing chemotherapy.  No evidence recurrent or metastatic disease on CT chest abdomen pelvis 07/14/2021  Peripheral neuropathy in bilateral feet-she is no longer on gabapentin. She is taking nortriptyline 10mg  QHS as managed by Dr. Tasia Catchings. No back pain.   Aortic atherosclerosis-compliant with pravastatin 40 mg   Due PCV20 which she declines    ROS: See pertinent positives and negatives per HPI.    EXAM:  VITALS per patient if applicable: Ht 5\' 3"  (1.6 m)   Wt 220 lb (99.8 kg)   BMI 38.97 kg/m  BP Readings from Last 3 Encounters:  07/16/21 120/74  06/03/21 132/82  05/06/21 114/75   Wt Readings from Last 3 Encounters:  07/20/21 220 lb (99.8 kg)  07/16/21 220 lb 12.8 oz (100.2 kg)  06/03/21 222 lb (100.7 kg)    GENERAL: alert, oriented, appears well and in no acute distress  HEENT: atraumatic, conjunttiva clear, no obvious abnormalities on inspection of external nose and ears  NECK: normal movements of the head and neck  LUNGS: on inspection no signs of respiratory distress, breathing rate appears normal, no obvious gross SOB, gasping or wheezing  CV: no obvious cyanosis  MS: moves all visible extremities without noticeable  abnormality  PSYCH/NEURO: pleasant and cooperative, no obvious depression or anxiety, speech and thought processing grossly intact  ASSESSMENT AND PLAN:  Discussed the following assessment and plan:  Problem List Items Addressed This Visit       Cardiovascular and Mediastinum   Aortic atherosclerosis (HCC)    chronic, Stable.  Continue pravastatin 40 mg        Nervous and Auditory   Neuropathy    Peripheral neuropathy in feet.  Most likely associated with chemotherapy.  She is no longer on gabapentin is helpful for her.  Recently started on nortriptyline 10 mg by Dr. Tasia Catchings.  Advised that with suboptimal control she may go ahead and increase to 20 mg nightly.  I have sent in a new prescription for her      Relevant Medications   nortriptyline (PAMELOR) 10 MG capsule     Other   Hyperlipidemia   Relevant Orders   Lipid panel   Screening for breast cancer   Relevant Orders   MM 3D SCREEN BREAST BILATERAL   Other Visit Diagnoses     History of prediabetes    -  Primary   Relevant Orders   Hemoglobin A1c   Screening for osteoporosis       Relevant Orders   DG Bone Density       -we discussed possible serious and likely etiologies, options for evaluation and workup, limitations of telemedicine visit vs in person visit, treatment, treatment risks and precautions. Pt prefers to treat via telemedicine empirically rather then risking or undertaking an in person visit at this  moment.  .   I discussed the assessment and treatment plan with the patient. The patient was provided an opportunity to ask questions and all were answered. The patient agreed with the plan and demonstrated an understanding of the instructions.   The patient was advised to call back or seek an in-person evaluation if the symptoms worsen or if the condition fails to improve as anticipated.   [name], FNP

## 2021-07-21 NOTE — Assessment & Plan Note (Signed)
Peripheral neuropathy in feet.  Most likely associated with chemotherapy.  She is no longer on gabapentin is helpful for her.  Recently started on nortriptyline 10 mg by Dr. Tasia Catchings.  Advised that with suboptimal control she may go ahead and increase to 20 mg nightly.  I have sent in a new prescription for her

## 2021-07-21 NOTE — Assessment & Plan Note (Signed)
chronic, Stable.  Continue pravastatin 40 mg

## 2021-08-05 NOTE — Telephone Encounter (Signed)
error 

## 2021-08-09 ENCOUNTER — Other Ambulatory Visit: Payer: Self-pay | Admitting: Family

## 2021-08-09 DIAGNOSIS — G629 Polyneuropathy, unspecified: Secondary | ICD-10-CM

## 2021-08-12 ENCOUNTER — Encounter: Payer: Self-pay | Admitting: Oncology

## 2021-08-13 ENCOUNTER — Other Ambulatory Visit: Payer: Self-pay | Admitting: *Deleted

## 2021-08-13 DIAGNOSIS — C5700 Malignant neoplasm of unspecified fallopian tube: Secondary | ICD-10-CM

## 2021-08-13 MED ORDER — OLAPARIB 150 MG PO TABS: 300.0000 mg | ORAL_TABLET | Freq: Two times a day (BID) | ORAL | 3 refills | Status: DC

## 2021-08-19 ENCOUNTER — Inpatient Hospital Stay: Payer: Medicare Other | Attending: Obstetrics and Gynecology | Admitting: Nurse Practitioner

## 2021-08-19 ENCOUNTER — Other Ambulatory Visit: Payer: Self-pay

## 2021-08-19 VITALS — BP 151/74 | HR 98 | Temp 98.7°F | Resp 20 | Wt 223.2 lb

## 2021-08-19 DIAGNOSIS — G620 Drug-induced polyneuropathy: Secondary | ICD-10-CM | POA: Diagnosis not present

## 2021-08-19 DIAGNOSIS — Z148 Genetic carrier of other disease: Secondary | ICD-10-CM | POA: Insufficient documentation

## 2021-08-19 DIAGNOSIS — D649 Anemia, unspecified: Secondary | ICD-10-CM | POA: Insufficient documentation

## 2021-08-19 DIAGNOSIS — C5700 Malignant neoplasm of unspecified fallopian tube: Secondary | ICD-10-CM

## 2021-08-19 DIAGNOSIS — Z90710 Acquired absence of both cervix and uterus: Secondary | ICD-10-CM | POA: Insufficient documentation

## 2021-08-19 DIAGNOSIS — Z9221 Personal history of antineoplastic chemotherapy: Secondary | ICD-10-CM | POA: Diagnosis not present

## 2021-08-19 DIAGNOSIS — C786 Secondary malignant neoplasm of retroperitoneum and peritoneum: Secondary | ICD-10-CM | POA: Diagnosis not present

## 2021-08-19 DIAGNOSIS — Z79899 Other long term (current) drug therapy: Secondary | ICD-10-CM | POA: Diagnosis not present

## 2021-08-19 DIAGNOSIS — C763 Malignant neoplasm of pelvis: Secondary | ICD-10-CM

## 2021-08-19 DIAGNOSIS — Z90722 Acquired absence of ovaries, bilateral: Secondary | ICD-10-CM | POA: Diagnosis not present

## 2021-08-19 NOTE — Progress Notes (Signed)
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 Tasia Catchings  Subjective:  Linda Schmitt is a 66 y.o. G1P1 female, initially seen in consultation from Dr. 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 She was last seen in clinic on 02/04/21.  She continues to tolerate olaparib well without significant side effects. No bleeding, pain, or discharge. No shortness of breath, changes in bowel movements, or incontinence. She is considering joining support groups in clinic.    Gynecologic Oncology History:  Linda Schmitt is a 66 y.o. female, initially seen in consultation from Dr. 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 Her history is as follows:  She presented with malignant pleural effusion two weeks after elective right shoulder surgery. She underwent thoracentesis on 02/18/2020 and 02/19/2020 with 1.8L and 1.2L removed respectively. Cytology was positive for PAX 8. PET scan showed evidence of peritoneal disease within the abdomen, soft tissue infiltrating into the omentum, large loculated left pleural  effusion with thin FDG avid rind of soft tissue overlying the left lung. Small right pleural effusion with mild FDG uptake is equivocal for malignant effusion of the right hemithorax. Fluid density structure within right side of pelvis abutting the right sided uterus noted though indeterminate. Pelvic u/s obtained showed heterogeneous myometrium with poor definition of endometrial complex. Normal left ovary.  No cystic right adnexal lesion is identified.  No a probable small normal-appearing right ovary is identified.  Suspect that the low-attenuation presumed cystic structure seen in the right adnexa on the prior image corresponds to a small amount of nonspecific free fluid in the right adnexa.  Biopsy of omental mass on 03/05/20  DIAGNOSIS:  A. OMENTAL MASS; CT-GUIDED BIOPSY:  - HIGH-GRADE SEROUS CARCINOMA.   Given that cytology from left pleural effusion is positive for metastatic carcinoma, tumor cells are positive for PAX 8 which indicates GYN origin.  Given that patient has moderate to large amount of pleural effusion, heavy tumor burden, recommend to start chemotherapy as soon as possible with carboplatin AUC 5-6 and paclitaxel 175 mg/m every 3 weeks for 3 cycles.  Patient will establish care with gynecology oncology for evaluation of participating in clinical trials after neoadjuvant chemo/surgery. CA 125 was 53.6 at time of diagnosis.   Treatment Summary: 03/04/20 Cycle 1 carboplatin-paclitaxel CA 125 53.6 03/25/20 Cycle 2 carboplatin-paclitaxel CA 125 39.9 04/15/20  Cycle 3 carboplatin-paclitaxel CA 125 10.5  04/25/20 CT C/A/P revealed moderate left malignant pleural effusion with some pleural thickening and enhancement which was decreased in size compared to prior exam. Omental caking apparent concerning for intraperitoneal metastatic disease. No definite primary malignancy identified. Stable left adrenal nodule. Aortic atherosclerosis.   05/06/2020 Cycle 4 carboplatin-paclitaxel CA125 5.8  On  06/05/2020 she underwent exam under anesthesia, Diagnostic laparoscopy, Robot-assisted total  laparoscopic hysterectomy, Bilateral salpingo-oophorectomy, Omentectomy via mini-laparotomy/hand assisted port, and Optimal (R<1) interval tumor debulking. Pathology c/w tubal origin.   Pathology: High grade serous adenocarcinoma involving bilateral ovaries and fallopian tubes; peritoneal nodules, cul-de-sac and omentum (primary site left tube). p53, p16 (strong, patchy), Ki67 (elevated)  11/22/2021Cycle #5 paclitaxel and carboplatin CA125 4.6 07/22/2020 Cycle #6 paclitaxel and carboplatin  CA125 4.8  Vitamin B12 = 681  GENETIC TEST RESULTS:   Germline Testing: 04/07/20- negative. No clinically significant mutation identified. Breast Cancer Riskscore remaining life time risk- 7%.   Somatic Testing: Genetic testing reported out on 04/08/2020 through the [facility] + [employer] HRD. No pathogenic variants identified on the [facility] (germline) panel. Pathogenic variant identified through [employer] (HRD testing) in BRCA1 called [id number], HRD positive.    The [facility] gene panel offered by [employer] includes sequencing and deletion/duplication testing of the following 35 genes: APC, ATM, AXIN2, BARD1, BMPR1A, BRCA1, BRCA2, BRIP1, CHD1, CDK4, CDKN2A, CHEK2, EPCAM (large rearrangement only), HOXB13, GALNT12, MLH1, MSH2, MSH3, MSH6, MUTYH, NBN, NTHL1, PALB2, PMS2, PTEN, RAD51C, RAD51D, RNF43, RPS20, SMAD4, STK11, and TP53. Sequencing was performed for select regions of POLE and POLD1, and large rearrangement analysis was performed for select regions of GREM1.   10/29/2020 Patient complaining of pelvic pain/pressure as well as constipation at [place] visit today. CTA ordered and completed on 11/14/2020. Included below.  IMPRESSION: 1. Status post interval hysterectomy and bilateral salpingectomy. 2. Status post interval omentectomy. There is no residual suspicious peritoneal nodularity  identified. Unchanged small nodules adjacent to the spleen which are stable compared to examinations dating back to 2017 and likely small splenules and/or lymph nodes. Attention on follow-up  3. Interval resolution of a previously seen moderate left pleural effusion. There is minimal residual pleural thickening.  4. Findings are consistent with treatment response. 5. No evidence of new metastatic disease in the chest, abdomen, or pelvis.  CA 125 has been followed since diagnosis 02/26/2020 53.6 (high)  03/25/2020 39.9  04/15/2020 10.5 (first normal) 05/06/2020 5.8  06/30/2020 4.6 07/22/2020 4.8 08/12/2020 4.3 09/02/2020 4.3 09/23/2020 4.7  10/21/2020 6.1 11/18/2020 6.9 12/16/2020 4.7 01/20/2021 3.4 02/16/2021 4.3 03/16/2021 4.9 05/04/2021 3.9 06/01/2021 3.8  07/13/2021 4.1   Health Maintenance Last Screening Mammogram 11/06/2020. Included below.  FINDINGS: There are no findings suspicious for malignancy. The images were evaluated with computer-aided detection. IMPRESSION: No mammographic evidence of malignancy. A result letter of this screening mammogram will be mailed directly to the patient. Repeat screening mammogram in 1 year.   Last Colonoscopy completed 12/10/2020.   Problem List: Patient Active Problem List   Diagnosis Date Noted   Screen for colon cancer    Polyp of colon    Normocytic anemia 06/30/2020   Carcinoma of fallopian tube ([place]) 06/25/2020   BRCA1 gene mutation positive 04/11/2020   Genetic testing 04/09/2020   Serous carcinoma of female pelvis ([place]) 03/20/2020   Family history of breast cancer    Encounter for antineoplastic chemotherapy 03/04/2020   Neuropathy 03/04/2020   Mass of omentum 02/28/2020   Goals of care, counseling/discussion 02/26/2020   Metastatic carcinoma ([place]) 02/26/2020   Status post thoracentesis    Malignant pleural effusion    Pleural effusion 02/17/2020   Mass of arm, right 01/22/2020   Elevated blood pressure reading  12/28/2019   Hand tingling 12/28/2019   Acute pain of right shoulder 12/01/2018   Adrenal adenoma, left 11/11/2017   Aortic atherosclerosis ([place]) 09/23/2017   Prediabetes 07/09/2017   Ganglion of right  wrist 07/06/2017   Hyperlipidemia 10/11/2016   Former smoker 09/21/2016   Depression, recurrent ([place]) 09/14/2016   Headache 09/14/2016   Routine general medical examination at a health care facility 01/14/2015   Screening for breast cancer 11/12/2013    Past Medical History: Past Medical History:  Diagnosis Date   Cancer [facility])    ovarian cancer   Family history of breast cancer    Genetic testing 04/09/2020   No pathogenic variants identified on the [employer] [facility] (germline) panel. The report date is 04/07/2020.   The [facility] gene panel offered by [employer] includes sequencing and deletion/duplication testing of the following 35 genes: APC, ATM, AXIN2, BARD1, BMPR1A, BRCA1, BRCA2, BRIP1, CHD1, CDK4, CDKN2A, CHEK2, EPCAM (large rearrangement only), HOXB13, GALNT12, MLH1, MSH2, MSH3, MSH6   HLD (hyperlipidemia)     Past Surgical History: Past Surgical History:  Procedure Laterality Date   CARPAL TUNNEL RELEASE Left    COLONOSCOPY WITH PROPOFOL N/A 12/10/2020   Procedure: COLONOSCOPY WITH PROPOFOL;  Surgeon: Virgel Manifold, MD;  Location: [facility] ENDOSCOPY;  Service: Endoscopy;  Laterality: N/A;   ROBOTIC ASSISTED TOTAL HYSTERECTOMY WITH BILATERAL SALPINGO OOPHERECTOMY  06/05/2020   with omentectomy tumor debulking via mini-laparotomy/hand assisted port   SHOULDER ARTHROSCOPY WITH SUBACROMIAL DECOMPRESSION AND OPEN ROTATOR C Right 02/01/2020   Procedure: RIGHT SHOULDER ARTHROSCOPIC SUBSCAPULARIS REPAIR, ROTATOR CUFF REPAIR, SUABCROMIAL DECOMPRESSION, AND BICEPS TENODESIS.;  Surgeon: Leim Fabry, MD;  Location: [facility] ORS;  Service: Orthopedics;  Laterality: Right;   TUBAL LIGATION      Past Gynecologic History: as per HPI   OB History:  OB History  Gravida Para  Term Preterm AB Living  1 1       1   SAB IAB Ectopic Multiple Live Births               # Outcome Date GA Lbr Len/2nd Weight Sex Delivery Anes PTL Lv  1 Para             Family History: Family History  Problem Relation Age of Onset   Cancer Mother        lymphoma   Stroke Sister    Cancer Brother        lung   Cancer Sister        lung   Lung cancer Brother    Bone cancer Sister    Breast cancer Cousin 41       maternal   Cancer Maternal Aunt        unk types  Family history significant for lung cancer, lymphoma, and breast cancer.   Social History: Social History   Socioeconomic History   Marital status: Single    Spouse name: Not on file   Number of children: Not on file   Years of education: Not on file   Highest education level: Not on file  Occupational History   Not on file  Tobacco Use   Smoking status: Former    Packs/day: 0.75    Years: 40.00    Pack years: 30.00    Types: Cigarettes    Quit date: 02/06/2018    Years since quitting: 3.5   Smokeless tobacco: Never   Tobacco comments:    quit 02/2018  Vaping Use   Vaping Use: Never used  Substance and Sexual Activity   Alcohol use: Yes    Comment: 2-3 glasses of wine per week,none last 24hrs   Drug use: No   Sexual activity: Not on file  Other Topics  Concern   Not on file  Social History Narrative   Lives in [place] alone. Work - [employer]   Diet: Regular   Exercise: walking   Social Determinants of Radio broadcast assistant Strain: Not on file  Food Insecurity: Not on file  Transportation Needs: Not on file  Physical Activity: Not on file  Stress: Not on file  Social Connections: Not on file  Intimate Partner Violence: Not on file   Immunization History  Administered Date(s) Administered   PFIZER Comirnaty(Gray Top)Covid-19 Tri-Sucrose Vaccine 05/08/2020   PFIZER(Purple Top)SARS-COV-2 Vaccination 05/08/2020, 05/29/2020   Tdap 12/24/2011   Allergies: No Known Allergies  Current  Medications: Current Outpatient Medications  Medication Sig Dispense Refill   cholecalciferol (VITAMIN D3) 10 MCG (400 UNIT) TABS tablet Take 800 Units by mouth daily.     cyanocobalamin 1000 MCG tablet Take 400 mcg by mouth daily.     nortriptyline (PAMELOR) 10 MG capsule TAKE 2 CAPSULES BY MOUTH AT BEDTIME. 180 capsule 0   olaparib (LYNPARZA) 150 MG tablet Take 2 tablets (300 mg total) by mouth 2 (two) times daily. Swallow whole. May take with food to decrease nausea and vomiting. 120 tablet 3   pravastatin (PRAVACHOL) 40 MG tablet Take 1 tablet (40 mg total) by mouth every evening. 90 tablet 3   No current facility-administered medications for this visit.   Immunization History  Administered Date(s) Administered   PFIZER Comirnaty(Gray Top)Covid-19 Tri-Sucrose Vaccine 05/08/2020   PFIZER(Purple Top)SARS-COV-2 Vaccination 05/08/2020, 05/29/2020   Tdap 12/24/2011   Review of Systems General:  no complaints Skin: no complaints Eyes: no complaints HEENT: no complaints Breasts: no complaints Pulmonary: no complaints Cardiac: no complaints Gastrointestinal: no complaints Genitourinary/Sexual: no complaints Ob/Gyn: no complaints Musculoskeletal: no complaints Hematology: no complaints Neurologic/Psych: no complaints   Objective:  Physical Examination:  BP (!) 151/74    Pulse 98    Temp 98.7 F (37.1 C)    Resp 20    Wt 223 lb 3.2 oz (101.2 kg)    SpO2 100%    BMI 39.54 kg/m     ECOG Performance Status: 1 - Symptomatic but completely ambulatory  GENERAL: Patient is a well appearing female in no acute distress HEENT:  Sclera clear. Anicteric NODES:  Negative axillary, supraclavicular, inguinal lymph node survery LUNGS:  Clear to auscultation bilaterally.   HEART:  Regular rate and rhythm.  ABDOMEN:  Soft, nontender.  No hernias, incisions well healed. No masses or ascites EXTREMITIES:  No peripheral edema. Atraumatic. No cyanosis SKIN:  Clear with no obvious rashes or skin  changes.  NEURO:  Nonfocal. Well oriented.  Appropriate affect.  Pelvic: chaperoned by CMA EGBUS: no lesions, Vagina: no lesions. Thin white discharge in vagina (asymptomatic), cuff intact. Cervix, Uterus, Adnexa: surgically absent. BME: no palpable masses, smooth. Rectovaginal: deferred  Lab Review n/a  Radiologic Imaging: No imaging on site today    Assessment:  Linda Schmitt is a 66 y.o. female diagnosed with stage IV high grade serous fallopian tube cancer based on malignant pleural effusion.  Underwent NACT with paclitaxel/carboplatin for 4 cycles and MIS optimal interval debulking on 06/05/2020.  Completed 3 cycles of adjuvant carboplatin-taxol chemotherapy on 08/12/20, currently on maintenance olaparib since January 2022 with Dr Tasia Catchings. NED on exam today.     BRCA1 somatic mutation; HRD+  Bacterial vaginosis.- resolved post flagyl  Medical co-morbidities complicating care: Body mass index is 39.54 kg/m. Plan:   Problem List Items Addressed This Visit   None  Tolerating  maintenance PARP inhibitor well without significant side effects. Continue per Dr. Tasia Catchings until disease progression or unacceptable toxicity.   Follow up with Dr. Tasia Catchings as scheduled for monitoring of use of parp inhibitor.  RTC in 3 months for follow up with MD (patient prefers female providers if possible).   The patient's diagnosis, an outline of the further diagnostic and laboratory studies which will be required, the recommendation for surgery, and alternatives were discussed with her and her accompanying family members.  All questions were answered to their satisfaction.   Beckey Rutter, DNP, AGNP-C [place] at [facility] [phone] (clinic)

## 2021-08-19 NOTE — Progress Notes (Signed)
Survivorship Care Plan visit completed.  Treatment summary reviewed and given to patient.  ASCO answers booklet reviewed and given to patient.  CARE program and Cancer Transitions discussed with patient along with other resources cancer center offers to patients and caregivers.  Patient verbalized understanding.    

## 2021-08-20 ENCOUNTER — Other Ambulatory Visit (INDEPENDENT_AMBULATORY_CARE_PROVIDER_SITE_OTHER): Payer: Self-pay

## 2021-08-20 DIAGNOSIS — E785 Hyperlipidemia, unspecified: Secondary | ICD-10-CM

## 2021-08-20 DIAGNOSIS — Z87898 Personal history of other specified conditions: Secondary | ICD-10-CM

## 2021-08-20 LAB — LIPID PANEL
Cholesterol: 176 mg/dL (ref 0–200)
HDL: 58.7 mg/dL (ref >39.00)
LDL Cholesterol: 98 mg/dL (ref 0–99)
NonHDL: 116.96
Total CHOL/HDL Ratio: 3
Triglycerides: 95 mg/dL (ref 0.0–149.0)
VLDL: 19 mg/dL (ref 0.0–40.0)

## 2021-08-20 LAB — HEMOGLOBIN A1C: Hgb A1c MFr Bld: 6.1 % (ref 4.6–6.5)

## 2021-08-24 ENCOUNTER — Other Ambulatory Visit (HOSPITAL_COMMUNITY): Payer: Self-pay

## 2021-08-24 ENCOUNTER — Encounter: Payer: Self-pay | Admitting: Oncology

## 2021-08-25 ENCOUNTER — Encounter: Payer: Self-pay | Admitting: Oncology

## 2021-09-01 ENCOUNTER — Encounter: Payer: Self-pay | Admitting: Oncology

## 2021-09-01 ENCOUNTER — Other Ambulatory Visit: Payer: Self-pay

## 2021-09-01 ENCOUNTER — Inpatient Hospital Stay (HOSPITAL_BASED_OUTPATIENT_CLINIC_OR_DEPARTMENT_OTHER): Payer: Medicare Other | Admitting: Oncology

## 2021-09-01 ENCOUNTER — Inpatient Hospital Stay: Payer: Medicare Other

## 2021-09-01 VITALS — BP 121/71 | HR 85 | Temp 97.8°F | Wt 225.0 lb

## 2021-09-01 DIAGNOSIS — Z79899 Other long term (current) drug therapy: Secondary | ICD-10-CM | POA: Diagnosis not present

## 2021-09-01 DIAGNOSIS — G629 Polyneuropathy, unspecified: Secondary | ICD-10-CM | POA: Diagnosis not present

## 2021-09-01 DIAGNOSIS — D649 Anemia, unspecified: Secondary | ICD-10-CM | POA: Diagnosis not present

## 2021-09-01 DIAGNOSIS — Z5111 Encounter for antineoplastic chemotherapy: Secondary | ICD-10-CM

## 2021-09-01 DIAGNOSIS — C786 Secondary malignant neoplasm of retroperitoneum and peritoneum: Secondary | ICD-10-CM | POA: Diagnosis not present

## 2021-09-01 DIAGNOSIS — C763 Malignant neoplasm of pelvis: Secondary | ICD-10-CM

## 2021-09-01 DIAGNOSIS — G620 Drug-induced polyneuropathy: Secondary | ICD-10-CM | POA: Diagnosis not present

## 2021-09-01 DIAGNOSIS — Z9221 Personal history of antineoplastic chemotherapy: Secondary | ICD-10-CM | POA: Diagnosis not present

## 2021-09-01 DIAGNOSIS — C5700 Malignant neoplasm of unspecified fallopian tube: Secondary | ICD-10-CM | POA: Diagnosis not present

## 2021-09-01 LAB — CBC WITH DIFFERENTIAL/PLATELET
Abs Immature Granulocytes: 0.02 10*3/uL (ref 0.00–0.07)
Basophils Absolute: 0 10*3/uL (ref 0.0–0.1)
Basophils Relative: 1 %
Eosinophils Absolute: 0.1 10*3/uL (ref 0.0–0.5)
Eosinophils Relative: 1 %
HCT: 34.3 % — ABNORMAL LOW (ref 36.0–46.0)
Hemoglobin: 11.3 g/dL — ABNORMAL LOW (ref 12.0–15.0)
Immature Granulocytes: 0 %
Lymphocytes Relative: 26 %
Lymphs Abs: 1.4 10*3/uL (ref 0.7–4.0)
MCH: 34.3 pg — ABNORMAL HIGH (ref 26.0–34.0)
MCHC: 32.9 g/dL (ref 30.0–36.0)
MCV: 104.3 fL — ABNORMAL HIGH (ref 80.0–100.0)
Monocytes Absolute: 0.4 10*3/uL (ref 0.1–1.0)
Monocytes Relative: 7 %
Neutro Abs: 3.5 10*3/uL (ref 1.7–7.7)
Neutrophils Relative %: 65 %
Platelets: 168 10*3/uL (ref 150–400)
RBC: 3.29 MIL/uL — ABNORMAL LOW (ref 3.87–5.11)
RDW: 14.6 % (ref 11.5–15.5)
WBC: 5.4 10*3/uL (ref 4.0–10.5)
nRBC: 0 % (ref 0.0–0.2)

## 2021-09-01 LAB — COMPREHENSIVE METABOLIC PANEL
ALT: 14 U/L (ref 0–44)
AST: 19 U/L (ref 15–41)
Albumin: 4.3 g/dL (ref 3.5–5.0)
Alkaline Phosphatase: 52 U/L (ref 38–126)
Anion gap: 10 (ref 5–15)
BUN: 9 mg/dL (ref 8–23)
CO2: 27 mmol/L (ref 22–32)
Calcium: 9.4 mg/dL (ref 8.9–10.3)
Chloride: 100 mmol/L (ref 98–111)
Creatinine, Ser: 0.92 mg/dL (ref 0.44–1.00)
GFR, Estimated: >60 mL/min (ref >60)
Glucose, Bld: 110 mg/dL — ABNORMAL HIGH (ref 70–99)
Potassium: 3.6 mmol/L (ref 3.5–5.1)
Sodium: 137 mmol/L (ref 135–145)
Total Bilirubin: 0.6 mg/dL (ref 0.3–1.2)
Total Protein: 7.3 g/dL (ref 6.5–8.1)

## 2021-09-01 NOTE — Progress Notes (Signed)
Hematology/Oncology Follow Up Note  [phone] [phone]  Patient Care Team: Burnard Hawthorne, FNP as PCP - General (Family Medicine) Clent Jacks, RN as Oncology Nurse Navigator Earlie Server, MD as Consulting Physician (Oncology) Mellody Drown, MD as Referring Physician (Gynecologic Oncology) Gillis Ends, MD as Referring Physician (Gynecologic Oncology) Virgel Manifold, MD (Inactive) as Consulting Physician (Gastroenterology)   Name of the patient: Linda Schmitt  [id number]  06/08/1956   REASON FOR VISIT  follow-up for stage IV ovarian cancer,  PERTINENT ONCOLOGY HISTORY LUCIANA CAMMARATA is a 66 y.o.afemale who has above oncology history reviewed by me today presented for follow up visit for management of malignant pleural effusion status post thoracentesis x2 during her recent admission. Cytology was positive for metastatic carcinoma.  TTF-1 Napsin A, GATA3, CDX2 were negative. Positive for PAX 8, which is most often expressing tumors of gynecological and renal origin. PET scan was independently reviewed by me and discussed with patient.  I also discussed with radiology. Exam showed evidence of peritoneal disease within the abdomen, soft tissue infiltrating into the omentum.  Large loculated left pleural effusion with thin FDG avid rind of soft tissue overlying the left lung.  Small right pleural effusion with mild FDG uptake is equivocal for malignant effusion of the right hemithorax. Fluid density structure within the right side of the pelvis abutting the right-sided of uterus is noted.  This is indeterminate.  Further investigation with pelvic sonogram may be helpful.  Pelvic ultrasound was obtained.-Heterogeneous myometrium with poor definition of endometrial complex. Normal left ovary.  No cystic right adnexal lesion is identified.  No a probable  small normal-appearing right ovary is identified.  Suspect that the low-attenuation presumed cystic structure seen in the right adnexa on the prior image corresponds to a small amount of nonspecific free fluid in the right adnexa. I discussed with gynecology oncology Dr.Secord.  Given that cytology from left pleural effusion is positive for metastatic carcinoma, tumor cells are positive for PAX 8 which indicates GYN origin.  Given that patient has moderate to large amount of pleural effusion, heavy tumor burden, recommend to start chemotherapy as soon as possible with carboplatin AUC 5-6 and Taxol 175 mg/m every 3 weeks for 3 cycles.   #Pre-existing neuropathy of left hand, she takes gabapentin. # seen by Dr. Theora Gianotti on 04/30/2020 and she recommends to proceed with the fourth cycle of chemotherapy and patient will be seen by Dr. Theora Gianotti again on 68/19/2021 for debulking surgery plan on 06/05/2020 with cardiothoracic surgery for thoracoscopy possible thoracic tumor debulking.  Patient was recommended to have to repeat CT of the chest after her chemotherapy.  Oncologic chemotherapy treatments 03/04/2020- 9/28/201 4 cycles of neoadjuvant carbo and Taxol  06/05/2020, status post Total hysterectomy and bilateral salpingo-oophorectomy, Omentectomy, and Optimal (R<1) interval tumor debulking. A. Peritoneal nodule, excisional biopsy: Fibroadipose tissue with calcifications, negative for tumor. B. Uterus, bilateral ovaries and fallopian tubes, hysterectomy and bilateral salpingo-oophorectomy: Cervix: Squamous metaplasia. Endometrium: Inactive endometrium. Myometrium: Adenomyosis. Serosa: High grade serous adenocarcinoma. Right ovary: High grade serous adenocarcinoma, involving the ovarian surface and parenchyma. Right fallopian tube: No specific pathologic change. Left ovary: High grade serous adenocarcinoma, involving the ovarian surface and parenchyma. Left fallopian tube: High grade serous  adenocarcinoma, involving tubal fimbria. Serous tubal intraepithelial carcinoma. Immunohistochemistry was performed on block B14 at [facility] to characterize the pathologic process and demonstrates the following immunophenotype in the cells of interest:POSITIVE:  p53, p16 (strong, patchy), Ki67 (elevated) This staining  profile supports the above diagnosis. C. Peritoneal nodule, left pericolic gutter, excisional biopsy: High grade serous adenocarcinoma. D. Cul-de-sac, excisional biopsy: High grade serous adenocarcinoma. E. Omentum, excision: High grade serous adenocarcinoma (up to 3.0 cm).  TUMOR   Tumor Site:    Left fallopian tube   Histologic Type:    Serous carcinoma   Histologic Grade:    High grade   Tumor Size:    Greatest Dimension (Centimeters): 0.2 cm   Ovarian Surface Involvement:    Present     Laterality:    Bilateral   Fallopian Tube Surface Involvement:    Present     Laterality:    Left   Implants:    Present (sites): left peritoneum, cul-de-sac, omentum   Other Tissue / Organ Involvement:    Right ovary   Other Tissue / Organ Involvement:    Left ovary   Other Tissue / Organ Involvement:    Left fallopian tube   Other Tissue / Organ Involvement:    Pelvic peritoneum   Other Tissue / Organ Involvement:    Omentum   Largest Extrapelvic Peritoneal Focus:    Macroscopic (greater than 2 cm)   Peritoneal / Ascitic Fluid:    Not submitted / unknown   Pleural Fluid:    Not submitted / unknown   Treatment Effect:    Moderate response identified (CRS 2)   LYMPH NODES   Regional Lymph Nodes:    No lymph nodes submitted or found   PATHOLOGIC STAGE CLASSIFICATION (pTNM, AJCC 8th Edition)   TNM Descriptors:    y (post-treatment)   Primary Tumor (pT):    pT3c   Regional Lymph Nodes (pN):    pNX  FIGO Stage:    IIIC   + pleural effusion so STAGE IV No germline BRCA mutation, -Homologous recombination deficiency positive.  Myriad Genomic instability score:  positive, somatic BRCA1  positive  07/01/20- 08/12/2020 3 cycles of adjuvant chemotherapy with carboplatin AUC 5, Taxol 175 mg/m. 09/02/2020, started on olaparib 300 mg twice daily.  March 2022 followed up with gyn and  At that time patient had some suprapubic pressure and pain of unclear etiology.  A CT chest abdomen pelvis was obtained to evaluate further. 11/14/2020 CT chest abdomen pelvis showed no residual suspicious peritoneal nodularity identified.  Unchanged small nodules adjacent to the spleen which are stable since 2017.  Interval resolution of previously seen moderate left pleural effusion.  Minimal residual pleural thickening.  03/18/2021, CT showed no obvious cancer metastasis or recurrence. There are borderline enlarged mediastinal lymph node and a right hilar node.  Attention on follow-up  07/14/2021 CT chest abdomen pelvis with contrast showed no evidence of recurrent or metastatic disease.  Borderline enlarged mediastinal lymph node and hilar lymphadenopathy, attention on follow-up.  Stable.  INTERVAL HISTORY 66 year old female presents for follow-up of stage IV ovarian cancer,  somatic BRCA1 positive and HRD positive.  Patient has been on olaparib since January 2022.   Overall she tolerates well.  Today she denies any new complaints. Denies nausea vomiting diarrhea Patient has chronic neuropathy symptoms, on nortriptyline Patient was previously on gabapentin 600 mg twice daily plus another 300 mg midday as needed.  And I added nortriptyline 10 mg at bedtime.  Due to miscommunication, patient discontinued gabapentin and took nortriptyline 10 mg.  She feels the neuropathy is not well controlled and per primary care providers recommendation, she has increased to 20 mg daily.  She still does not feel that her symptoms are  well controlled.  Review of Systems  Constitutional:  Negative for appetite change, chills, fatigue and fever.  HENT:   Negative for hearing loss and voice change.   Eyes:  Negative for eye  problems.  Respiratory:  Negative for chest tightness and cough.   Cardiovascular:  Negative for chest pain.  Gastrointestinal:  Negative for abdominal distention, abdominal pain and blood in stool.  Endocrine: Negative for hot flashes.  Genitourinary:  Negative for difficulty urinating and frequency.   Musculoskeletal:  Negative for arthralgias.  Skin:  Negative for itching and rash.  Neurological:  Positive for numbness. Negative for extremity weakness.  Hematological:  Negative for adenopathy.  Psychiatric/Behavioral:  Positive for sleep disturbance. Negative for confusion.        No Known Allergies   Past Medical History:  Diagnosis Date   Cancer ([place])    ovarian cancer   Family history of breast cancer    Genetic testing 04/09/2020   No pathogenic variants identified on the Myriad MyRisk (germline) panel. The report date is 04/07/2020.   The [facility] gene panel offered by [employer] includes sequencing and deletion/duplication testing of the following 35 genes: APC, ATM, AXIN2, BARD1, BMPR1A, BRCA1, BRCA2, BRIP1, CHD1, CDK4, CDKN2A, CHEK2, EPCAM (large rearrangement only), HOXB13, GALNT12, MLH1, MSH2, MSH3, MSH6   HLD (hyperlipidemia)      Past Surgical History:  Procedure Laterality Date   CARPAL TUNNEL RELEASE Left    COLONOSCOPY WITH PROPOFOL N/A 12/10/2020   Procedure: COLONOSCOPY WITH PROPOFOL;  Surgeon: Virgel Manifold, MD;  Location: [facility] ENDOSCOPY;  Service: Endoscopy;  Laterality: N/A;   ROBOTIC ASSISTED TOTAL HYSTERECTOMY WITH BILATERAL SALPINGO OOPHERECTOMY  06/05/2020   with omentectomy tumor debulking via mini-laparotomy/hand assisted port   SHOULDER ARTHROSCOPY WITH SUBACROMIAL DECOMPRESSION AND OPEN ROTATOR C Right 02/01/2020   Procedure: RIGHT SHOULDER ARTHROSCOPIC SUBSCAPULARIS REPAIR, ROTATOR CUFF REPAIR, SUABCROMIAL DECOMPRESSION, AND BICEPS TENODESIS.;  Surgeon: Leim Fabry, MD;  Location: [facility] ORS;  Service: Orthopedics;  Laterality:  Right;   TUBAL LIGATION      Social History   Socioeconomic History   Marital status: Single    Spouse name: Not on file   Number of children: Not on file   Years of education: Not on file   Highest education level: Not on file  Occupational History   Not on file  Tobacco Use   Smoking status: Former    Packs/day: 0.75    Years: 40.00    Pack years: 30.00    Types: Cigarettes    Quit date: 02/06/2018    Years since quitting: 3.5   Smokeless tobacco: Never   Tobacco comments:    quit 02/2018  Vaping Use   Vaping Use: Never used  Substance and Sexual Activity   Alcohol use: Yes    Comment: 2-3 glasses of wine per week,none last 24hrs   Drug use: No   Sexual activity: Not on file  Other Topics Concern   Not on file  Social History Narrative   Lives in [place] alone. Work - [employer]   Diet: Regular   Exercise: walking   Social Determinants of Radio broadcast assistant Strain: Not on [employer] Insecurity: Not on file  Transportation Needs: Not on file  Physical Activity: Not on file  Stress: Not on file  Social Connections: Not on file  Intimate Partner Violence: Not on file    Family History  Problem Relation Age of Onset   Cancer Mother  lymphoma   Stroke Sister    Cancer Brother        lung   Cancer Sister        lung   Lung cancer Brother    Bone cancer Sister    Breast cancer Cousin 68       maternal   Cancer Maternal Aunt        unk types     Current Outpatient Medications:    cholecalciferol (VITAMIN D3) 10 MCG (400 UNIT) TABS tablet, Take 800 Units by mouth daily., Disp: , Rfl:    cyanocobalamin 1000 MCG tablet, Take 400 mcg by mouth daily., Disp: , Rfl:    nortriptyline (PAMELOR) 10 MG capsule, TAKE 2 CAPSULES BY MOUTH AT BEDTIME., Disp: 180 capsule, Rfl: 0   olaparib (LYNPARZA) 150 MG tablet, Take 2 tablets (300 mg total) by mouth 2 (two) times daily. Swallow whole. May take with food to decrease nausea and vomiting., Disp: 120  tablet, Rfl: 3   pravastatin (PRAVACHOL) 40 MG tablet, Take 1 tablet (40 mg total) by mouth every evening., Disp: 90 tablet, Rfl: 3  Physical exam:  Vitals:   09/01/21 0944  BP: 121/71  Pulse: 85  Temp: 97.8 F (36.6 C)  TempSrc: Tympanic  Weight: 225 lb (102.1 kg)   Physical Exam Constitutional:      General: She is not in acute distress.    Appearance: She is not diaphoretic.  HENT:     Head: Normocephalic and atraumatic.     Nose: Nose normal.     Mouth/Throat:     Pharynx: No oropharyngeal exudate.  Eyes:     General: No scleral icterus.    Pupils: Pupils are equal, round, and reactive to light.  Cardiovascular:     Rate and Rhythm: Normal rate and regular rhythm.     Heart sounds: No murmur heard. Pulmonary:     Effort: Pulmonary effort is normal. No respiratory distress.     Breath sounds: No rales.  Chest:     Chest wall: No tenderness.  Abdominal:     General: There is no distension.     Palpations: Abdomen is soft.     Tenderness: There is no abdominal tenderness.  Musculoskeletal:        General: Normal range of motion.     Cervical back: Normal range of motion and neck supple.  Skin:    General: Skin is warm and dry.     Findings: No erythema.  Neurological:     Mental Status: She is alert and oriented to person, place, and time.     Cranial Nerves: No cranial nerve deficit.     Motor: No abnormal muscle tone.     Coordination: Coordination normal.  Psychiatric:        Mood and Affect: Affect normal.         CMP Latest Ref Rng & Units 09/01/2021  Glucose 70 - 99 mg/dL 110(H)  BUN 8 - 23 mg/dL 9  Creatinine 0.44 - 1.00 mg/dL 0.92  Sodium 135 - 145 mmol/L 137  Potassium 3.5 - 5.1 mmol/L 3.6  Chloride 98 - 111 mmol/L 100  CO2 22 - 32 mmol/L 27  Calcium 8.9 - 10.3 mg/dL 9.4  Total Protein 6.5 - 8.1 g/dL 7.3  Total Bilirubin 0.3 - 1.2 mg/dL 0.6  Alkaline Phos 38 - 126 U/L 52  AST 15 - 41 U/L 19  ALT 0 - 44 U/L 14   CBC Latest Ref Rng &  Units 09/01/2021  WBC 4.0 - 10.5 K/uL 5.4  Hemoglobin 12.0 - 15.0 g/dL 11.3(L)  Hematocrit 36.0 - 46.0 % 34.3(L)  Platelets 150 - 400 K/uL 168    RADIOGRAPHIC STUDIES: I have personally reviewed the radiological images as listed and agreed with the findings in the report. CT CHEST ABDOMEN PELVIS W CONTRAST  Result Date: 07/14/2021 CLINICAL DATA:  History of ovarian cancer status post hysterectomy, bilateral salpingo-oophorectomy and omentectomy. EXAM: CT CHEST, ABDOMEN, AND PELVIS WITH CONTRAST TECHNIQUE: Multidetector CT imaging of the chest, abdomen and pelvis was performed following the standard protocol during bolus administration of intravenous contrast. CONTRAST:  123m OMNIPAQUE IOHEXOL 300 MG/ML  SOLN COMPARISON:  Multiple priors including most recent CT March 18, 2021. FINDINGS: CT CHEST FINDINGS Cardiovascular: Aortic atherosclerosis without thoracic aortic aneurysm. No central pulmonary embolus on this nondedicated study. Normal size heart. No significant pericardial effusion/thickening. Mediastinum/Nodes: No supraclavicular adenopathy. No discrete thyroid nodularity. Small lymph node in the anterior mediastinum measures 6 mm on image 20/2, previously 8 mm but overall stable dating back to 05/20/2020. Additional paratracheal nodes are also unchanged. Right hilar lymph node measures 8 mm on image 26/2, previously 10 mm. Lungs/Pleura: No suspicious pulmonary nodules or masses. No focal airspace consolidation. No pleural effusion. No pneumothorax. Musculoskeletal: Mild thoracic spondylosis. No aggressive lytic or blastic lesion of bone. CT ABDOMEN PELVIS FINDINGS Hepatobiliary: No suspicious hepatic lesion. Gallbladder is unremarkable. No biliary ductal dilation. Pancreas: No pancreatic ductal dilation or evidence of acute inflammation. Spleen: Normal in size without focal abnormality. Adrenals/Urinary Tract: Stable 1.4 cm left adrenal nodule previously characterized as an adenoma on PET-CT February 25, 2020. Right adrenal glands unremarkable. No hydronephrosis. No solid enhancing renal mass. Urinary bladder is unremarkable for degree of distension. Stomach/Bowel: Radiopaque enteric contrast material traverses the splenic flexure. Tiny hiatal hernia otherwise the stomach is unremarkable. No pathologic dilation of small or large bowel. The appendix and terminal ileum appear normal. Moderate volume of formed stool throughout the colon suggestive of constipation. No suspicious colonic wall thickening or mass like lesions identified. Vascular/Lymphatic: No abdominal aortic aneurysm. No pathologically enlarged abdominal or pelvic lymph nodes. Reproductive: Status post hysterectomy. No adnexal masses. Other: Left upper quadrant, perisplenic nodularity is unchanged, stable dating back to 2017 and likely reflecting small splenules. No new discrete peritoneal or omental nodularity. No abdominopelvic free fluid. Musculoskeletal: Multilevel degenerative changes spine. Unchanged mild L1 superior endplate deformity associated with a large Schmorl's node. No aggressive lytic or blastic lesion of bone. IMPRESSION: 1. No evidence of new or progressive disease within the chest, abdomen, or pelvis. 2. Stable borderline enlarged mediastinal and right hilar lymph nodes, nonspecific and possibly reactive. Recommend attention on follow-up imaging. 3. Moderate volume of formed stool throughout the colon suggestive of constipation. 4. Stable 1.4 cm left adrenal nodule previously characterized as an adenoma on PET-CT February 25, 2020. 5.  Aortic Atherosclerosis (ICD10-I70.0). Electronically Signed   By: JDahlia BailiffM.D.   On: 07/14/2021 15:59      Assessment and plan Patient is a 66y.o. female presents for follow-up for FIGO IIIC ovarian cancer, ( somatic BRCA1 positive, HRD positive). 1. 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 Encounter for antineoplastic chemotherapy   3. Neuropathy   4. Normocytic anemia    #FIGO  IVA Fallopian tube serous carcinoma- + pleural effusion cytology S/p 4 cycles of neoadjuvant chemotherapy  Carboplatin /Taxol  S/p total hysterectomy with salpingo-oophorectomy and omentectomy. Optimal (R<1) interval tumor debulking, -followed by 3  cycles of adjuvant carboplatin/Taxol. Labs reviewed and discussed with patient.  CA-125 4.1, stable.  Continue.  Olaparib 300 mg twice daily.   #chemotherapy induced neuropathy, previously on gabapentin 600 mg twice daily plus another 300 mg midday as needed.  Due to miscommunication, instead of adding nortriptyline, patient stopped gabapentin and switch to nortriptyline, currently on 20m QHS.  She has been referred previously to acupuncture clinic.   Refer to neurology for further evaluation and management. .Marland Kitchen#Anemia,   Hemoglobin is 11.3.  Macrocytosis likely due to olaparib.  Monitor, previous work-up showed normal B12 and folate.  Likely sick due to olaparib.  We spent sufficient time to discuss many aspect of care, questions were answered to patient's satisfaction.  Follow-up in 6 weeks   ZEarlie Server MD, PhD  09/01/2021

## 2021-09-02 LAB — CA 125: Cancer Antigen (CA) 125: 4.7 U/mL (ref 0.0–38.1)

## 2021-10-06 ENCOUNTER — Other Ambulatory Visit (HOSPITAL_COMMUNITY): Payer: Self-pay

## 2021-10-13 ENCOUNTER — Other Ambulatory Visit: Payer: Self-pay

## 2021-10-13 ENCOUNTER — Inpatient Hospital Stay: Payer: Medicare Other | Attending: Oncology

## 2021-10-13 DIAGNOSIS — C763 Malignant neoplasm of pelvis: Secondary | ICD-10-CM

## 2021-10-13 DIAGNOSIS — Z79899 Other long term (current) drug therapy: Secondary | ICD-10-CM | POA: Diagnosis not present

## 2021-10-13 DIAGNOSIS — C569 Malignant neoplasm of unspecified ovary: Secondary | ICD-10-CM | POA: Insufficient documentation

## 2021-10-13 DIAGNOSIS — G620 Drug-induced polyneuropathy: Secondary | ICD-10-CM | POA: Diagnosis not present

## 2021-10-13 DIAGNOSIS — T451X5A Adverse effect of antineoplastic and immunosuppressive drugs, initial encounter: Secondary | ICD-10-CM | POA: Insufficient documentation

## 2021-10-13 DIAGNOSIS — D649 Anemia, unspecified: Secondary | ICD-10-CM | POA: Diagnosis not present

## 2021-10-13 LAB — CBC WITH DIFFERENTIAL/PLATELET
Abs Immature Granulocytes: 0.01 10*3/uL (ref 0.00–0.07)
Basophils Absolute: 0 10*3/uL (ref 0.0–0.1)
Basophils Relative: 1 %
Eosinophils Absolute: 0.1 10*3/uL (ref 0.0–0.5)
Eosinophils Relative: 1 %
HCT: 35.6 % — ABNORMAL LOW (ref 36.0–46.0)
Hemoglobin: 11.6 g/dL — ABNORMAL LOW (ref 12.0–15.0)
Immature Granulocytes: 0 %
Lymphocytes Relative: 32 %
Lymphs Abs: 1.3 10*3/uL (ref 0.7–4.0)
MCH: 34.5 pg — ABNORMAL HIGH (ref 26.0–34.0)
MCHC: 32.6 g/dL (ref 30.0–36.0)
MCV: 106 fL — ABNORMAL HIGH (ref 80.0–100.0)
Monocytes Absolute: 0.4 10*3/uL (ref 0.1–1.0)
Monocytes Relative: 9 %
Neutro Abs: 2.3 10*3/uL (ref 1.7–7.7)
Neutrophils Relative %: 57 %
Platelets: 161 10*3/uL (ref 150–400)
RBC: 3.36 MIL/uL — ABNORMAL LOW (ref 3.87–5.11)
RDW: 15.2 % (ref 11.5–15.5)
WBC: 4.1 10*3/uL (ref 4.0–10.5)
nRBC: 0 % (ref 0.0–0.2)

## 2021-10-13 LAB — COMPREHENSIVE METABOLIC PANEL
ALT: 14 U/L (ref 0–44)
AST: 19 U/L (ref 15–41)
Albumin: 4.2 g/dL (ref 3.5–5.0)
Alkaline Phosphatase: 55 U/L (ref 38–126)
Anion gap: 7 (ref 5–15)
BUN: 15 mg/dL (ref 8–23)
CO2: 28 mmol/L (ref 22–32)
Calcium: 9.3 mg/dL (ref 8.9–10.3)
Chloride: 104 mmol/L (ref 98–111)
Creatinine, Ser: 1.03 mg/dL — ABNORMAL HIGH (ref 0.44–1.00)
GFR, Estimated: 60 mL/min — ABNORMAL LOW (ref >60)
Glucose, Bld: 108 mg/dL — ABNORMAL HIGH (ref 70–99)
Potassium: 3.7 mmol/L (ref 3.5–5.1)
Sodium: 139 mmol/L (ref 135–145)
Total Bilirubin: 0.5 mg/dL (ref 0.3–1.2)
Total Protein: 7 g/dL (ref 6.5–8.1)

## 2021-10-14 LAB — CA 125: Cancer Antigen (CA) 125: 5.2 U/mL (ref 0.0–38.1)

## 2021-10-15 ENCOUNTER — Other Ambulatory Visit: Payer: Self-pay

## 2021-10-15 ENCOUNTER — Inpatient Hospital Stay (HOSPITAL_BASED_OUTPATIENT_CLINIC_OR_DEPARTMENT_OTHER): Payer: Medicare Other | Admitting: Oncology

## 2021-10-15 ENCOUNTER — Encounter: Payer: Self-pay | Admitting: Oncology

## 2021-10-15 VITALS — BP 123/87 | HR 81 | Temp 97.4°F | Resp 18 | Wt 226.7 lb

## 2021-10-15 DIAGNOSIS — C569 Malignant neoplasm of unspecified ovary: Secondary | ICD-10-CM | POA: Diagnosis not present

## 2021-10-15 DIAGNOSIS — D649 Anemia, unspecified: Secondary | ICD-10-CM | POA: Diagnosis not present

## 2021-10-15 DIAGNOSIS — Z5111 Encounter for antineoplastic chemotherapy: Secondary | ICD-10-CM

## 2021-10-15 DIAGNOSIS — G620 Drug-induced polyneuropathy: Secondary | ICD-10-CM | POA: Diagnosis not present

## 2021-10-15 DIAGNOSIS — C763 Malignant neoplasm of pelvis: Secondary | ICD-10-CM | POA: Diagnosis not present

## 2021-10-15 DIAGNOSIS — G629 Polyneuropathy, unspecified: Secondary | ICD-10-CM

## 2021-10-15 DIAGNOSIS — Z79899 Other long term (current) drug therapy: Secondary | ICD-10-CM | POA: Diagnosis not present

## 2021-10-15 DIAGNOSIS — T451X5A Adverse effect of antineoplastic and immunosuppressive drugs, initial encounter: Secondary | ICD-10-CM | POA: Diagnosis not present

## 2021-10-15 NOTE — Progress Notes (Signed)
Hematology/Oncology Progress note [phone] [phone]     Patient Care Team: Burnard Hawthorne, FNP as PCP - General (Family Medicine) Clent Jacks, RN as Oncology Nurse Navigator Earlie Server, MD as Consulting Physician (Oncology) Mellody Drown, MD as Referring Physician (Gynecologic Oncology) Gillis Ends, MD as Referring Physician (Gynecologic Oncology) Virgel Manifold, MD (Inactive) as Consulting Physician (Gastroenterology)   Name of the patient: Linda Schmitt  [id number]  1956-01-06   REASON FOR VISIT  follow-up for stage IV ovarian cancer,  PERTINENT ONCOLOGY HISTORY Linda Schmitt is a 66 y.o.afemale who has above oncology history reviewed by me today presented for follow up visit for management of malignant pleural effusion status post thoracentesis x2 during her recent admission. Cytology was positive for metastatic carcinoma.  TTF-1 Napsin A, GATA3, CDX2 were negative. Positive for PAX 8, which is most often expressing tumors of gynecological and renal origin. PET scan was independently reviewed by me and discussed with patient.  I also discussed with radiology. Exam showed evidence of peritoneal disease within the abdomen, soft tissue infiltrating into the omentum.  Large loculated left pleural effusion with thin FDG avid rind of soft tissue overlying the left lung.  Small right pleural effusion with mild FDG uptake is equivocal for malignant effusion of the right hemithorax. Fluid density structure within the right side of the pelvis abutting the right-sided of uterus is noted.  This is indeterminate.  Further investigation with pelvic sonogram may be helpful.  Pelvic ultrasound was obtained.-Heterogeneous myometrium with poor definition of endometrial complex. Normal left ovary.  No cystic right adnexal lesion is identified.  No a  probable small normal-appearing right ovary is identified.  Suspect that the low-attenuation presumed cystic structure seen in the right adnexa on the prior image corresponds to a small amount of nonspecific free fluid in the right adnexa. I discussed with gynecology oncology Dr.Secord.  Given that cytology from left pleural effusion is positive for metastatic carcinoma, tumor cells are positive for PAX 8 which indicates GYN origin.  Given that patient has moderate to large amount of pleural effusion, heavy tumor burden, recommend to start chemotherapy as soon as possible with carboplatin AUC 5-6 and Taxol 175 mg/m every 3 weeks for 3 cycles.   #Pre-existing neuropathy of left hand, she takes gabapentin. # seen by Dr. Theora Gianotti on 04/30/2020 and she recommends to proceed with the fourth cycle of chemotherapy and patient will be seen by Dr. Theora Gianotti again on 05/27/2020 for debulking surgery plan on 06/05/2020 with cardiothoracic surgery for thoracoscopy possible thoracic tumor debulking.  Patient was recommended to have to repeat CT of the chest after her chemotherapy.  Oncologic chemotherapy treatments 03/04/2020- 9/28/201 4 cycles of neoadjuvant carbo and Taxol  06/05/2020, status post Total hysterectomy and bilateral salpingo-oophorectomy, Omentectomy, and Optimal (R<1) interval tumor debulking. A. Peritoneal nodule, excisional biopsy: Fibroadipose tissue with calcifications, negative for tumor. B. Uterus, bilateral ovaries and fallopian tubes, hysterectomy and bilateral salpingo-oophorectomy: Cervix: Squamous metaplasia. Endometrium: Inactive endometrium. Myometrium: Adenomyosis. Serosa: High grade serous adenocarcinoma. Right ovary: High grade serous adenocarcinoma, involving the ovarian surface and parenchyma. Right fallopian tube: No specific pathologic change. Left ovary: High grade serous adenocarcinoma, involving the ovarian surface and parenchyma. Left fallopian tube: High grade  serous adenocarcinoma, involving tubal fimbria. Serous tubal intraepithelial carcinoma. Immunohistochemistry was performed on block B14 at [facility] to characterize the pathologic process and demonstrates the following immunophenotype in the cells of interest:POSITIVE:  p53, p16 (strong, patchy), Ki67 (elevated) This  staining profile supports the above diagnosis. C. Peritoneal nodule, left pericolic gutter, excisional biopsy: High grade serous adenocarcinoma. D. Cul-de-sac, excisional biopsy: High grade serous adenocarcinoma. E. Omentum, excision: High grade serous adenocarcinoma (up to 3.0 cm).  TUMOR   Tumor Site:    Left fallopian tube   Histologic Type:    Serous carcinoma   Histologic Grade:    High grade   Tumor Size:    Greatest Dimension (Centimeters): 0.2 cm   Ovarian Surface Involvement:    Present     Laterality:    Bilateral   Fallopian Tube Surface Involvement:    Present     Laterality:    Left   Implants:    Present (sites): left peritoneum, cul-de-sac, omentum   Other Tissue / Organ Involvement:    Right ovary   Other Tissue / Organ Involvement:    Left ovary   Other Tissue / Organ Involvement:    Left fallopian tube   Other Tissue / Organ Involvement:    Pelvic peritoneum   Other Tissue / Organ Involvement:    Omentum   Largest Extrapelvic Peritoneal Focus:    Macroscopic (greater than 2 cm)   Peritoneal / Ascitic Fluid:    Not submitted / unknown   Pleural Fluid:    Not submitted / unknown   Treatment Effect:    Moderate response identified (CRS 2)   LYMPH NODES   Regional Lymph Nodes:    No lymph nodes submitted or found   PATHOLOGIC STAGE CLASSIFICATION (pTNM, AJCC 8th Edition)   TNM Descriptors:    y (post-treatment)   Primary Tumor (pT):    pT3c   Regional Lymph Nodes (pN):    pNX  FIGO Stage:    IIIC   + pleural effusion so STAGE IV No germline BRCA mutation, -Homologous recombination deficiency positive.  Myriad Genomic instability score:  positive, somatic  BRCA1 positive  07/01/20- 08/12/2020 3 cycles of adjuvant chemotherapy with carboplatin AUC 5, Taxol 175 mg/m. 09/02/2020, started on olaparib 300 mg twice daily.  March 2022 followed up with gyn and  At that time patient had some suprapubic pressure and pain of unclear etiology.  A CT chest abdomen pelvis was obtained to evaluate further. 11/14/2020 CT chest abdomen pelvis showed no residual suspicious peritoneal nodularity identified.  Unchanged small nodules adjacent to the spleen which are stable since 2017.  Interval resolution of previously seen moderate left pleural effusion.  Minimal residual pleural thickening.  03/18/2021, CT showed no obvious cancer metastasis or recurrence. There are borderline enlarged mediastinal lymph node and a right hilar node.  Attention on follow-up  07/14/2021 CT chest abdomen pelvis with contrast showed no evidence of recurrent or metastatic disease.  Borderline enlarged mediastinal lymph node and hilar lymphadenopathy, attention on follow-up.  Stable.  Patient was previously on gabapentin 600 mg twice daily plus another 300 mg midday as needed.  And I added nortriptyline 10 mg at bedtime.  Due to miscommunication, patient discontinued gabapentin and took nortriptyline 10 mg.  She feels the neuropathy is not well controlled and per primary care providers recommendation, she has increased to 20 mg daily.  She still does not feel that her symptoms are well controlled.  INTERVAL HISTORY 66 year old female presents for follow-up of stage IV ovarian cancer,  somatic BRCA1 positive and HRD positive.  Patient has been on olaparib since January 2022.   Patient reports that she tolerates olaparib.. Chronic neuropathy, on nortriptyline 20 mg.  Symptoms are not improved.  Review of Systems  Constitutional:  Negative for appetite change, chills, fatigue and fever.  HENT:   Negative for hearing loss and voice change.   Eyes:  Negative for eye problems.  Respiratory:   Negative for chest tightness and cough.   Cardiovascular:  Negative for chest pain.  Gastrointestinal:  Negative for abdominal distention, abdominal pain and blood in stool.  Endocrine: Negative for hot flashes.  Genitourinary:  Negative for difficulty urinating and frequency.   Musculoskeletal:  Negative for arthralgias.  Skin:  Negative for itching and rash.  Neurological:  Positive for numbness. Negative for extremity weakness.  Hematological:  Negative for adenopathy.  Psychiatric/Behavioral:  Positive for sleep disturbance. Negative for confusion.        No Known Allergies   Past Medical History:  Diagnosis Date   Cancer ([place])    ovarian cancer   Family history of breast cancer    Genetic testing 04/09/2020   No pathogenic variants identified on the Myriad MyRisk (germline) panel. The report date is 04/07/2020.   The [facility] gene panel offered by [employer] includes sequencing and deletion/duplication testing of the following 35 genes: APC, ATM, AXIN2, BARD1, BMPR1A, BRCA1, BRCA2, BRIP1, CHD1, CDK4, CDKN2A, CHEK2, EPCAM (large rearrangement only), HOXB13, GALNT12, MLH1, MSH2, MSH3, MSH6   HLD (hyperlipidemia)      Past Surgical History:  Procedure Laterality Date   CARPAL TUNNEL RELEASE Left    COLONOSCOPY WITH PROPOFOL N/A 12/10/2020   Procedure: COLONOSCOPY WITH PROPOFOL;  Surgeon: Virgel Manifold, MD;  Location: [facility] ENDOSCOPY;  Service: Endoscopy;  Laterality: N/A;   ROBOTIC ASSISTED TOTAL HYSTERECTOMY WITH BILATERAL SALPINGO OOPHERECTOMY  06/05/2020   with omentectomy tumor debulking via mini-laparotomy/hand assisted port   SHOULDER ARTHROSCOPY WITH SUBACROMIAL DECOMPRESSION AND OPEN ROTATOR C Right 02/01/2020   Procedure: RIGHT SHOULDER ARTHROSCOPIC SUBSCAPULARIS REPAIR, ROTATOR CUFF REPAIR, SUABCROMIAL DECOMPRESSION, AND BICEPS TENODESIS.;  Surgeon: Leim Fabry, MD;  Location: [facility] ORS;  Service: Orthopedics;  Laterality: Right;   TUBAL LIGATION       Social History   Socioeconomic History   Marital status: Single    Spouse name: Not on file   Number of children: Not on file   Years of education: Not on file   Highest education level: Not on file  Occupational History   Not on file  Tobacco Use   Smoking status: Former    Packs/day: 0.75    Years: 40.00    Pack years: 30.00    Types: Cigarettes    Quit date: 02/06/2018    Years since quitting: 3.6   Smokeless tobacco: Never   Tobacco comments:    quit 02/2018  Vaping Use   Vaping Use: Never used  Substance and Sexual Activity   Alcohol use: Yes    Comment: 2-3 glasses of wine per week,none last 24hrs   Drug use: No   Sexual activity: Not on file  Other Topics Concern   Not on file  Social History Narrative   Lives in [place] alone. Work - [employer]   Diet: Regular   Exercise: walking   Social Determinants of Radio broadcast assistant Strain: Not on [employer] Insecurity: Not on file  Transportation Needs: Not on file  Physical Activity: Not on file  Stress: Not on file  Social Connections: Not on file  Intimate Partner Violence: Not on file    Family History  Problem Relation Age of Onset   Cancer Mother  lymphoma   Stroke Sister    Cancer Brother        lung   Cancer Sister        lung   Lung cancer Brother    Bone cancer Sister    Breast cancer Cousin 78       maternal   Cancer Maternal Aunt        unk types     Current Outpatient Medications:    cholecalciferol (VITAMIN D3) 10 MCG (400 UNIT) TABS tablet, Take 800 Units by mouth daily., Disp: , Rfl:    cyanocobalamin 1000 MCG tablet, Take 400 mcg by mouth daily., Disp: , Rfl:    nortriptyline (PAMELOR) 10 MG capsule, TAKE 2 CAPSULES BY MOUTH AT BEDTIME., Disp: 180 capsule, Rfl: 0   olaparib (LYNPARZA) 150 MG tablet, Take 2 tablets (300 mg total) by mouth 2 (two) times daily. Swallow whole. May take with food to decrease nausea and vomiting., Disp: 120 tablet, Rfl: 3   pravastatin  (PRAVACHOL) 40 MG tablet, Take 1 tablet (40 mg total) by mouth every evening., Disp: 90 tablet, Rfl: 3  Physical exam:  Vitals:   10/15/21 1023  BP: 123/87  Pulse: 81  Resp: 18  Temp: (!) 97.4 F (36.3 C)  Weight: 226 lb 11.2 oz (102.8 kg)   Physical Exam Constitutional:      General: She is not in acute distress.    Appearance: She is not diaphoretic.  HENT:     Head: Normocephalic and atraumatic.     Nose: Nose normal.     Mouth/Throat:     Pharynx: No oropharyngeal exudate.  Eyes:     General: No scleral icterus.    Pupils: Pupils are equal, round, and reactive to light.  Cardiovascular:     Rate and Rhythm: Normal rate and regular rhythm.     Heart sounds: No murmur heard. Pulmonary:     Effort: Pulmonary effort is normal. No respiratory distress.     Breath sounds: No rales.  Chest:     Chest wall: No tenderness.  Abdominal:     General: There is no distension.     Palpations: Abdomen is soft.     Tenderness: There is no abdominal tenderness.  Musculoskeletal:        General: Normal range of motion.     Cervical back: Normal range of motion and neck supple.  Skin:    General: Skin is warm and dry.     Findings: No erythema.  Neurological:     Mental Status: She is alert and oriented to person, place, and time.     Cranial Nerves: No cranial nerve deficit.     Motor: No abnormal muscle tone.     Coordination: Coordination normal.  Psychiatric:        Mood and Affect: Affect normal.         CMP Latest Ref Rng & Units 10/13/2021  Glucose 70 - 99 mg/dL 108(H)  BUN 8 - 23 mg/dL 15  Creatinine 0.44 - 1.00 mg/dL 1.03(H)  Sodium 135 - 145 mmol/L 139  Potassium 3.5 - 5.1 mmol/L 3.7  Chloride 98 - 111 mmol/L 104  CO2 22 - 32 mmol/L 28  Calcium 8.9 - 10.3 mg/dL 9.3  Total Protein 6.5 - 8.1 g/dL 7.0  Total Bilirubin 0.3 - 1.2 mg/dL 0.5  Alkaline Phos 38 - 126 U/L 55  AST 15 - 41 U/L 19  ALT 0 - 44 U/L 14   CBC Latest  Ref Rng & Units 10/13/2021  WBC 4.0 -  10.5 K/uL 4.1  Hemoglobin 12.0 - 15.0 g/dL 11.6(L)  Hematocrit 36.0 - 46.0 % 35.6(L)  Platelets 150 - 400 K/uL 161    RADIOGRAPHIC STUDIES: I have personally reviewed the radiological images as listed and agreed with the findings in the report. No results found.    Assessment and plan Patient is a 66 y.o. female presents for follow-up for FIGO IIIC ovarian cancer, ( somatic BRCA1 positive, HRD positive). 1. 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 Encounter for antineoplastic chemotherapy   3. Neuropathy   4. Normocytic anemia    #FIGO IVA Fallopian tube serous carcinoma- + pleural effusion cytology S/p 4 cycles of neoadjuvant chemotherapy  Carboplatin /Taxol  S/p total hysterectomy with salpingo-oophorectomy and omentectomy. Optimal (R<1) interval tumor debulking, -followed by 3 cycles of adjuvant carboplatin/Taxol. Labs reviewed and discussed with patient.  CA-125 5.2.  Normal, gradually increasing. Continue.  Olaparib 300 mg twice daily. Obtain CT chest abdomen pelvis with contrast in April 2022.   #chemotherapy induced neuropathy, previously on gabapentin 600 mg twice daily plus another 300 mg midday as needed.  Due to miscommunication, instead of adding nortriptyline, patient stopped gabapentin and switch to nortriptyline, currently on 44m QHS.  She has been referred previously to acupuncture clinic.   Refer to neurology for further evaluation and management. .Marland Kitchen#Anemia,   Hemoglobin is 11.6.  Macrocytosis likely due to olaparib.  Monitor, previous work-up showed normal B12 and folate.  Likely sick due to olaparib.  We spent sufficient time to discuss many aspect of care, questions were answered to patient's satisfaction.  Follow-up in 8 weeks   ZEarlie Server MD, PhD  10/15/2021

## 2021-10-15 NOTE — Progress Notes (Signed)
Patient here for follow up. No new concerns voiced.  °

## 2021-10-20 ENCOUNTER — Other Ambulatory Visit: Payer: Self-pay | Admitting: Pharmacist

## 2021-10-20 DIAGNOSIS — C5700 Malignant neoplasm of unspecified fallopian tube: Secondary | ICD-10-CM

## 2021-10-20 MED ORDER — OLAPARIB 150 MG PO TABS: 300.0000 mg | ORAL_TABLET | Freq: Two times a day (BID) | ORAL | 2 refills | Status: DC

## 2021-10-23 ENCOUNTER — Encounter: Payer: Self-pay | Admitting: Internal Medicine

## 2021-10-23 ENCOUNTER — Inpatient Hospital Stay (HOSPITAL_BASED_OUTPATIENT_CLINIC_OR_DEPARTMENT_OTHER): Payer: Medicare Other | Admitting: Internal Medicine

## 2021-10-23 ENCOUNTER — Other Ambulatory Visit: Payer: Self-pay

## 2021-10-23 VITALS — BP 115/68 | HR 95 | Temp 97.7°F | Resp 18 | Ht 64.25 in | Wt 224.0 lb

## 2021-10-23 DIAGNOSIS — T451X5A Adverse effect of antineoplastic and immunosuppressive drugs, initial encounter: Secondary | ICD-10-CM

## 2021-10-23 DIAGNOSIS — G620 Drug-induced polyneuropathy: Secondary | ICD-10-CM

## 2021-10-23 DIAGNOSIS — Z79899 Other long term (current) drug therapy: Secondary | ICD-10-CM | POA: Diagnosis not present

## 2021-10-23 DIAGNOSIS — C569 Malignant neoplasm of unspecified ovary: Secondary | ICD-10-CM | POA: Diagnosis not present

## 2021-10-23 DIAGNOSIS — D649 Anemia, unspecified: Secondary | ICD-10-CM | POA: Diagnosis not present

## 2021-10-23 MED ORDER — PREGABALIN 50 MG PO CAPS: 50.0000 mg | ORAL_CAPSULE | Freq: Two times a day (BID) | ORAL | 3 refills | Status: DC

## 2021-10-23 NOTE — Progress Notes (Signed)
? ?[facility] [address]  ?[place], [place] [address] ?[phone] ? ? ?New Patient Evaluation ? ?Date of Service: 10/23/21 ?Patient Name: Linda Schmitt ?Patient MRN: [id number] ?Patient DOB: 1955-11-10 ?Provider: Ventura Sellers, MD ? ?Identifying Statement:  ?Linda Schmitt is a 66 y.o. female with Chemotherapy-induced neuropathy ([place]) who presents for initial consultation and evaluation regarding cancer associated neurologic deficits.   ? ?Referring Provider: ?Burnard Hawthorne, FNP ?[phone] ?[address] ?[place],  [place] [address] ? ?Primary Cancer: ? 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 Staging form: Ovary, Fallopian Tube, and Primary Peritoneal Carcinoma, AJCC 8th Edition ?- Pathologic stage from 02/18/2020: FIGO Stage IVA (pM1a) - Signed by Earlie Server, MD on 08/12/2020 ? ?  ?06/25/2020 Initial Diagnosis  ? 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  ? ? ?History of Present Illness: ?The patient's records from the referring physician were obtained and reviewed and the patient interviewed to confirm this HPI.  Linda Schmitt presents today to review neuropathic symptoms.  She describes >1 year history of "sand paper feeling", "tingling", "some burning" affecting her feet (bottoms and top) and a little in her fingertips.  Onset was following taxol chemotherapy for ovarian carcinoma.  Symptoms are continuous, they have not improved over the year interval.  She is concerned about the neuropathy interfering with her planned cruise this summer.  Previously she did not tolerate gabapentin (sedation) or pamelor (ineffective). ? ?Medications: ?Current Outpatient Medications on File Prior to Visit  ?Medication Sig Dispense Refill  ? cholecalciferol (VITAMIN D3) 10 MCG (400 UNIT) TABS tablet Take 800 Units by mouth daily.    ? cyanocobalamin 1000 MCG tablet Take 400 mcg by mouth daily.    ? nortriptyline (PAMELOR)  10 MG capsule TAKE 2 CAPSULES BY MOUTH AT BEDTIME. 180 capsule 0  ? olaparib (LYNPARZA) 150 MG tablet Take 2 tablets (300 mg total) by mouth 2 (two) times daily. May take with food to decrease nausea and vomiting. 120 tablet 2  ? pravastatin (PRAVACHOL) 40 MG tablet Take 1 tablet (40 mg total) by mouth every evening. 90 tablet 3  ? ?No current facility-administered medications on file prior to visit.  ? ? ?Allergies: No Known Allergies ?Past Medical History:  ?Past Medical History:  ?Diagnosis Date  ? Cancer [facility])   ? ovarian cancer  ? Family history of breast cancer   ? Genetic testing 04/09/2020  ? No pathogenic variants identified on the Myriad MyRisk (germline) panel. The report date is 04/07/2020.   The [facility] gene panel offered by [employer] includes sequencing and deletion/duplication testing of the following 35 genes: APC, ATM, AXIN2, BARD1, BMPR1A, BRCA1, BRCA2, BRIP1, CHD1, CDK4, CDKN2A, CHEK2, EPCAM (large rearrangement only), HOXB13, GALNT12, MLH1, MSH2, MSH3, MSH6  ? HLD (hyperlipidemia)   ? ?Past Surgical History:  ?Past Surgical History:  ?Procedure Laterality Date  ? CARPAL TUNNEL RELEASE Left   ? COLONOSCOPY WITH PROPOFOL N/A 12/10/2020  ? Procedure: COLONOSCOPY WITH PROPOFOL;  Surgeon: Virgel Manifold, MD;  Location: [facility] ENDOSCOPY;  Service: Endoscopy;  Laterality: N/A;  ? ROBOTIC ASSISTED TOTAL HYSTERECTOMY WITH BILATERAL SALPINGO OOPHERECTOMY  06/05/2020  ? with omentectomy tumor debulking via mini-laparotomy/hand assisted port  ? SHOULDER ARTHROSCOPY WITH SUBACROMIAL DECOMPRESSION AND OPEN ROTATOR C Right 02/01/2020  ? Procedure: RIGHT SHOULDER ARTHROSCOPIC SUBSCAPULARIS REPAIR, ROTATOR CUFF REPAIR, SUABCROMIAL DECOMPRESSION, AND BICEPS TENODESIS.;  Surgeon: Leim Fabry, MD;  Location[facility]  ORS;  Service: Orthopedics;  Laterality: Right;  ? TUBAL LIGATION    ? ?Social History:  ?Social History  ? ?Socioeconomic History  ? Marital status: Single  ?  Spouse name: Not on file  ?  Number of children: Not on file  ? Years of education: Not on file  ? Highest education level: Not on file  ?Occupational History  ? Not on file  ?Tobacco Use  ? Smoking status: Former  ?  Packs/day: 0.75  ?  Years: 40.00  ?  Pack years: 30.00  ?  Types: Cigarettes  ?  Quit date: 02/06/2018  ?  Years since quitting: 3.7  ? Smokeless tobacco: Never  ? Tobacco comments:  ?  quit 02/2018  ?Vaping Use  ? Vaping Use: Never used  ?Substance and Sexual Activity  ? Alcohol use: Yes  ?  Comment: 2-3 glasses of wine per week,none last 24hrs  ? Drug use: No  ? Sexual activity: Not on file  ?Other Topics Concern  ? Not on file  ?Social History Narrative  ? Lives in [place] alone. Work - [employer]  ? Diet: Regular  ? Exercise: walking  ? ?Social Determinants of Health  ? ?Financial Resource Strain: Not on file  ?Food Insecurity: Not on file  ?Transportation Needs: Not on file  ?Physical Activity: Not on file  ?Stress: Not on file  ?Social Connections: Not on file  ?Intimate Partner Violence: Not on file  ? ?Family History:  ?Family History  ?Problem Relation Age of Onset  ? Cancer Mother   ?     lymphoma  ? Stroke Sister   ? Cancer Brother   ?     lung  ? Cancer Sister   ?     lung  ? Lung cancer Brother   ? Bone cancer Sister   ? Breast cancer Cousin 61  ?     maternal  ? Cancer Maternal Aunt   ?     unk types  ? ? ?Review of Systems: ?Constitutional: Doesn't report fevers, chills or abnormal weight loss ?Eyes: Doesn't report blurriness of vision ?Ears, nose, mouth, throat, and face: Doesn't report sore throat ?Respiratory: Doesn't report cough, dyspnea or wheezes ?Cardiovascular: Doesn't report palpitation, chest discomfort  ?Gastrointestinal:  Doesn't report nausea, constipation, diarrhea ?GU: Doesn't report incontinence ?Skin: Doesn't report skin rashes ?Neurological: Per HPI ?Musculoskeletal: Doesn't report joint pain ?Behavioral/Psych: Doesn't report anxiety ? ?Physical Exam: ?Vitals:  ? 10/23/21 0921  ?BP: 115/68  ?Pulse:  95  ?Resp: 18  ?Temp: 97.7 ?F (36.5 ?C)  ?SpO2: 100%  ? ?KPS: 90. ?General: Alert, cooperative, pleasant, in no acute distress ?Head: Normal ?EENT: No conjunctival injection or scleral icterus.  ?Lungs: Resp effort normal ?Cardiac: Regular rate ?Abdomen: Non-distended abdomen ?Skin: No rashes cyanosis or petechiae. ?Extremities: No clubbing or edema ? ?Neurologic Exam: ?Mental Status: Awake, alert, attentive to examiner. Oriented to self and environment. Language is fluent with intact comprehension.  ?Cranial Nerves: Visual acuity is grossly normal. Visual fields are full. Extra-ocular movements intact. No ptosis. Face is symmetric ?Motor: Tone and bulk are normal. Power is full in both arms and legs. Reflexes are symmetric, no pathologic reflexes present.  ?Sensory: Stocking neuropathic changes ?Gait: Normal. ? ? ?Labs: ?I have reviewed the data as listed ?   ?Component Value Date/Time  ? NA 139 10/13/2021 0913  ? K 3.7 10/13/2021 0913  ? CL 104 10/13/2021 0913  ? CO2 28 10/13/2021 0913  ? GLUCOSE 108 (H) 10/13/2021  0913  ? BUN 15 10/13/2021 0913  ? CREATININE 1.03 (H) 10/13/2021 0913  ? CALCIUM 9.3 10/13/2021 0913  ? PROT 7.0 10/13/2021 0913  ? ALBUMIN 4.2 10/13/2021 0913  ? AST 19 10/13/2021 0913  ? ALT 14 10/13/2021 0913  ? ALKPHOS 55 10/13/2021 0913  ? BILITOT 0.5 10/13/2021 0913  ? GFRNONAA 60 (L) 10/13/2021 0913  ? GFRAA >60 05/06/2020 0801  ? ?Lab Results  ?Component Value Date  ? WBC 4.1 10/13/2021  ? NEUTROABS 2.3 10/13/2021  ? HGB 11.6 (L) 10/13/2021  ? HCT 35.6 (L) 10/13/2021  ? MCV 106.0 (H) 10/13/2021  ? PLT 161 10/13/2021  ? ? ?Assessment/Plan ?Chemotherapy-induced neuropathy ([place]) ? ?Linda Schmitt presents with clinical syndrome consistent with symmetric, length dependent, small and large fiber peripheral neuropathy.  Etiology is exposure to taxol chemotherapy, primarily in 2021. ? ?We reviewed pathophysiology of chemotherapy induced neuropathy, available treatments, and goals of care. ? ?Her  syndrome has been refractory to both gabapentin and a TCA.  We discussed and recommended a trial of Lyrica, starting with 79m BID.  She was agreeable with this plan. ? ?We spent twenty additional minut

## 2021-11-09 ENCOUNTER — Ambulatory Visit
Admission: RE | Admit: 2021-11-09 | Discharge: 2021-11-09 | Disposition: A | Discharge status: Home / Self Care | Payer: Medicare Other | Source: Ambulatory Visit | Attending: Family | Admitting: Family

## 2021-11-09 DIAGNOSIS — Z1231 Encounter for screening mammogram for malignant neoplasm of breast: Secondary | ICD-10-CM | POA: Insufficient documentation

## 2021-11-09 DIAGNOSIS — Z13820 Encounter for screening for osteoporosis: Secondary | ICD-10-CM | POA: Diagnosis not present

## 2021-11-09 DIAGNOSIS — M85851 Other specified disorders of bone density and structure, right thigh: Secondary | ICD-10-CM | POA: Diagnosis not present

## 2021-11-09 DIAGNOSIS — Z780 Asymptomatic menopausal state: Secondary | ICD-10-CM | POA: Insufficient documentation

## 2021-11-11 ENCOUNTER — Other Ambulatory Visit: Payer: Self-pay | Admitting: Family

## 2021-11-11 DIAGNOSIS — G629 Polyneuropathy, unspecified: Secondary | ICD-10-CM

## 2021-11-18 ENCOUNTER — Encounter: Payer: Self-pay | Admitting: Family

## 2021-11-18 ENCOUNTER — Inpatient Hospital Stay (HOSPITAL_BASED_OUTPATIENT_CLINIC_OR_DEPARTMENT_OTHER): Payer: Medicare Other | Admitting: Obstetrics and Gynecology

## 2021-11-18 VITALS — BP 135/82 | HR 79 | Resp 20 | Wt 228.2 lb

## 2021-11-18 DIAGNOSIS — R102 Pelvic and perineal pain: Secondary | ICD-10-CM | POA: Insufficient documentation

## 2021-11-18 DIAGNOSIS — M8580 Other specified disorders of bone density and structure, unspecified site: Secondary | ICD-10-CM | POA: Insufficient documentation

## 2021-11-18 DIAGNOSIS — N898 Other specified noninflammatory disorders of vagina: Secondary | ICD-10-CM | POA: Diagnosis not present

## 2021-11-18 DIAGNOSIS — N760 Acute vaginitis: Secondary | ICD-10-CM | POA: Insufficient documentation

## 2021-11-18 DIAGNOSIS — Z79899 Other long term (current) drug therapy: Secondary | ICD-10-CM | POA: Insufficient documentation

## 2021-11-18 DIAGNOSIS — Z1502 Genetic susceptibility to malignant neoplasm of ovary: Secondary | ICD-10-CM | POA: Insufficient documentation

## 2021-11-18 DIAGNOSIS — Z6838 Body mass index (BMI) 38.0-38.9, adult: Secondary | ICD-10-CM | POA: Insufficient documentation

## 2021-11-18 DIAGNOSIS — Z9221 Personal history of antineoplastic chemotherapy: Secondary | ICD-10-CM | POA: Insufficient documentation

## 2021-11-18 DIAGNOSIS — Z1509 Genetic susceptibility to other malignant neoplasm: Secondary | ICD-10-CM | POA: Insufficient documentation

## 2021-11-18 DIAGNOSIS — C5700 Malignant neoplasm of unspecified fallopian tube: Secondary | ICD-10-CM | POA: Insufficient documentation

## 2021-11-18 DIAGNOSIS — Z1501 Genetic susceptibility to malignant neoplasm of breast: Secondary | ICD-10-CM | POA: Insufficient documentation

## 2021-11-18 DIAGNOSIS — R1030 Lower abdominal pain, unspecified: Secondary | ICD-10-CM | POA: Insufficient documentation

## 2021-11-18 NOTE — Progress Notes (Signed)
? ?Gynecologic Oncology Interval Visit  ?[place]  ?[phone] [phone] ? ?Patient Care Team: ?Burnard Hawthorne, FNP as PCP - General (Family Medicine) ?Clent Jacks, RN as Oncology Nurse Navigator ?Earlie Server, MD as Consulting Physician (Oncology) ?Mellody Drown, MD as Referring Physician (Gynecologic Oncology) ?Gillis Ends, MD as Referring Physician (Gynecologic Oncology) ?Virgel Manifold, MD (Inactive) as Consulting Physician (Gastroenterology)  ? ?Name of the patient: Linda Schmitt  ?[id number]  ?[birth year]  ? ?Date of visit: 11/18/2021 ? 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 ?Referring Provider: Dr. Tasia Catchings ? ?Subjective:  ?Linda Schmitt is a 66 y.o. G1P1 female, initially seen in consultation from Dr. 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  ? ?She saw Dr. Tasia Catchings on 10/15/2021. CT planned for 11/29/2021 ? ?She continues to tolerate olaparib well without significant side effects. She presents for a pelvic exam.  ? ?Component Ref Range & Units 1 mo ago 2 mo ago 4 mo ago 5 mo ago 6 mo ago 8 mo ago 9 mo ago  ?Cancer Antigen (CA) 125 0.0 - 38.1 U/mL 5.2  4.7 CM  4.1 CM  3.8 CM  3.9 CM  4.9 CM  4.3   ? ? ?Gynecologic Oncology History:  ?Linda Schmitt is a 66 y.o. female, initially seen in consultation from Dr. 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 Her history is as follows:  ?She presented with malignant pleural effusion two weeks after elective right shoulder surgery. She underwent thoracentesis on 02/18/2020 and 02/19/2020 with 1.8L and 1.2L removed respectively. Cytology was positive for PAX 8. PET scan showed evidence of peritoneal disease  within the abdomen, soft tissue infiltrating into the omentum, large loculated left pleural effusion with thin FDG avid rind of soft tissue overlying the left lung. Small right pleural effusion with mild FDG uptake is equivocal for malignant effusion of the right hemithorax. Fluid density structure within right side of pelvis abutting the right sided uterus noted though indeterminate. Pelvic u/s obtained showed heterogeneous myometrium with poor definition of endometrial complex. Normal left ovary.  No cystic right adnexal lesion is identified.  No a probable small normal-appearing right ovary is identified.  Suspect that the low-attenuation presumed cystic structure seen in the right adnexa on the prior image corresponds to a small amount of nonspecific free fluid in the right adnexa. ? ?Biopsy of omental mass on 03/05/20  ?DIAGNOSIS:  ?A. OMENTAL MASS; CT-GUIDED BIOPSY:  ?- HIGH-GRADE SEROUS CARCINOMA.  ? ?Given that cytology from left pleural effusion is positive for metastatic carcinoma, tumor cells are positive for PAX 8 which indicates GYN origin.  Given that patient has moderate to large amount of pleural effusion, heavy tumor burden, recommend to start chemotherapy as soon as possible with carboplatin AUC 5-6 and paclitaxel 175 mg/m? every 3 weeks for 3 cycles.  Patient will establish care with gynecology oncology for evaluation of participating in clinical trials after neoadjuvant chemo/surgery. CA 125 was 53.6 at time of diagnosis.  ? ?Treatment Summary: ?03/04/20 Cycle 1 carboplatin-paclitaxel CA 125 53.6 ?03/25/20 Cycle 2 carboplatin-paclitaxel CA 125 39.9 ?04/15/20  Cycle 3 carboplatin-paclitaxel CA 125 10.5 ? ?04/25/20 CT C/A/P revealed moderate left malignant pleural effusion with some pleural thickening and enhancement which was decreased in size  compared to prior exam. Omental caking apparent concerning for intraperitoneal metastatic disease. No definite primary malignancy identified. Stable left adrenal  nodule. Aortic atherosclerosis.  ? ?05/06/2020 Cycle 4 carboplatin-paclitaxel CA125 5.8 ? ?On 06/05/2020 she underwent exam under anesthesia, Diagnostic laparoscopy, Robot-assisted total laparoscopic hysterectomy, Bilateral salpingo-oophorectomy, Omentectomy via mini-laparotomy/hand assisted port, and Optimal (R<1) interval tumor debulking. Pathology c/w tubal origin.  ? ?Pathology: High grade serous adenocarcinoma involving bilateral ovaries and fallopian tubes; peritoneal nodules, cul-de-sac and omentum (primary site left tube). p53, p16 (strong, patchy), Ki67 (elevated) ? ?11/22/2021Cycle #5 paclitaxel and carboplatin CA125 4.6 ?07/22/2020 Cycle #6 paclitaxel and carboplatin  CA125 4.8 ? ?January 2022 olaparib maintenance initiated ? ?10/29/2020 ?Patient complaining of pelvic pain/pressure as well as constipation at [facility] visit today. CTA ordered and completed on 11/14/2020. Included below.  ?IMPRESSION: ?1. Status post interval hysterectomy and bilateral salpingectomy. ?2. Status post interval omentectomy. There is no residual suspicious peritoneal nodularity identified. Unchanged small nodules adjacent to the spleen which are stable compared to examinations dating back to 2017 and likely small splenules and/or lymph nodes. Attention on ?follow-up  ?3. Interval resolution of a previously seen moderate left pleural effusion. There is minimal residual pleural thickening.  ?4. Findings are consistent with treatment response. ?5. No evidence of new metastatic disease in the chest, abdomen, or pelvis. ? ?03/18/2021, CT showed no obvious cancer metastasis or recurrence. ?There are borderline enlarged mediastinal lymph node and a right hilar node.  Attention on follow-up ?  ?07/14/2021 CT chest abdomen pelvis with contrast showed no evidence of recurrent or metastatic disease.  Borderline enlarged mediastinal lymph node and hilar lymphadenopathy, attention on follow-up.  Stable ? ?CA 125 has been followed since  diagnosis ?02/26/2020 53.6 (high)  ?03/25/2020 39.9  ?04/15/2020 10.5 (first normal) ?05/06/2020 5.8  ?06/30/2020 4.6 ?07/22/2020 4.8 ?08/12/2020 4.3 ?09/02/2020 4.3 ?09/23/2020 4.7  ?10/21/2020 6.1 ?11/18/2020 6.9 ?12/16/2020 4.7 ?01/20/2021 3.4 ?02/16/2021 4.3 ?03/16/2021 4.9 ?05/04/2021 3.9 ?06/01/2021 3.8  ?07/13/2021 4.1 ?08/31/2021 4.7 ?10/13/2021 5.2 ? ?GENETIC TEST RESULTS:   ?Germline Testing: 04/07/20- negative. No clinically significant mutation identified. Breast Cancer Riskscore remaining life time risk- 7%.  ? ?Somatic Testing: Genetic testing reported out on 04/08/2020 through the [facility] HRD. No pathogenic variants identified on the [facility] (germline) panel. Pathogenic variant identified through [facility] (HRD testing) in BRCA1 called [id number], HRD positive.  ?  ?The MyRisk gene panel offered by [employer] includes sequencing and deletion/duplication testing of the following 35 genes: APC, ATM, AXIN2, BARD1, BMPR1A, BRCA1, BRCA2, BRIP1, CHD1, CDK4, CDKN2A, CHEK2, EPCAM (large rearrangement only), HOXB13, GALNT12, MLH1, MSH2, MSH3, MSH6, MUTYH, NBN, NTHL1, PALB2, PMS2, PTEN, RAD51C, RAD51D, RNF43, RPS20, SMAD4, STK11, and TP53. Sequencing was performed for select regions of POLE and POLD1, and large rearrangement analysis was performed for select regions of GREM1.  ? ? ?Health Maintenance ?Last Screening Mammogram 11/09/2020. BI-RADS CATEGORY  1: Negative. ?Repeat screening mammogram in 1 year.  ? ?Last Colonoscopy completed 12/10/2020.  ? ?Problem List: ?Patient Active Problem List  ? Diagnosis Date Noted  ? Screen for colon cancer   ? Polyp of colon   ? Normocytic anemia 06/30/2020  ? Carcinoma of fallopian tube ([place]) 06/25/2020  ? BRCA1 gene mutation positive 04/11/2020  ? Genetic testing 04/09/2020  ? Serous carcinoma of female pelvis ([place]) 03/20/2020  ? Family history of breast cancer   ? Encounter for antineoplastic chemotherapy 03/04/2020  ?  Chemotherapy-induced neuropathy ([place]) 03/04/2020  ? Mass of omentum 02/28/2020  ? Goals  of care, counseling/discussion 02/26/2020  ? Metastatic carcinoma ([place]) 02/26/2020  ? Status post thoracentesis   ? Malignant pleural effusio

## 2021-11-20 ENCOUNTER — Inpatient Hospital Stay: Payer: Medicare Other | Admitting: Internal Medicine

## 2021-11-20 DIAGNOSIS — G620 Drug-induced polyneuropathy: Secondary | ICD-10-CM

## 2021-11-20 NOTE — Progress Notes (Unsigned)
I connected with Linda Schmitt on 11/20/21 at 11:50 AM EDT by {Blank single:19197::"video enabled telemedicine visit","telephone visit"} and verified that I am speaking with the correct person using two identifiers.  ?I discussed the limitations, risks, security and privacy concerns of performing an evaluation and management service by telemedicine and the availability of in-person appointments. I also discussed with the patient that there may be a patient responsible charge related to this service. The patient expressed understanding and agreed to proceed.  ?Other persons participating in the visit and their role in the encounter:  ***  ?Patient's location:  ***  ?Provider's location:  ***  ?Chief Complaint:  *** ? ?History of Present Ilness: ?Observations: ?Assessment and Plan: ?Follow Up Instructions: ? ?I discussed the assessment and treatment plan with the patient.  The patient was provided an opportunity to ask questions and all were answered.  The patient agreed with the plan and demonstrated understanding of the instructions.   ? ?The patient was advised to call back or seek an in-person evaluation if the symptoms worsen or if the condition fails to improve as anticipated.  I provided 5-10 minutes of non-face-to-face time during this enocunter. ? ?Ventura Sellers, MD ? ? ?I provided *** minutes of {Blank single:19197::"face-to-face video visit time","non face-to-face telephone visit time"} during this encounter, and > 50% was spent counseling as documented under my assessment & plan.  ? ?

## 2021-11-23 ENCOUNTER — Ambulatory Visit
Admission: RE | Admit: 2021-11-23 | Discharge: 2021-11-23 | Disposition: A | Discharge status: Home / Self Care | Payer: Medicare Other | Source: Ambulatory Visit | Attending: Oncology | Admitting: Oncology

## 2021-11-23 DIAGNOSIS — D7389 Other diseases of spleen: Secondary | ICD-10-CM | POA: Diagnosis not present

## 2021-11-23 DIAGNOSIS — C763 Malignant neoplasm of pelvis: Secondary | ICD-10-CM | POA: Insufficient documentation

## 2021-11-23 DIAGNOSIS — I700 Atherosclerosis of aorta: Secondary | ICD-10-CM | POA: Diagnosis not present

## 2021-11-23 MED ORDER — IOHEXOL 300 MG/ML  SOLN
100.0000 mL | Freq: Once | INTRAMUSCULAR | Status: AC | PRN
  Administered 2021-11-23: 100 mL via INTRAVENOUS

## 2021-11-27 ENCOUNTER — Ambulatory Visit (INDEPENDENT_AMBULATORY_CARE_PROVIDER_SITE_OTHER): Payer: Medicare Other

## 2021-11-27 VITALS — Ht 64.25 in | Wt 228.0 lb

## 2021-11-27 DIAGNOSIS — Z0000 Encounter for general adult medical examination without abnormal findings: Secondary | ICD-10-CM | POA: Diagnosis not present

## 2021-11-27 NOTE — Patient Instructions (Addendum)
?  [name] , ?Thank you for taking time to come for your Medicare Wellness Visit. I appreciate your ongoing commitment to your health goals. Please review the following plan we discussed and let me know if I can assist you in the future.  ? ?These are the goals we discussed: ? Goals   ? ?  ? Patient Stated  ?   Reduce sugar intake (pt-stated)   ?   Healthy diet ?  ? ?  ?  ?This is a list of the screening recommended for you and due dates:  ?Health Maintenance  ?Topic Date Due  ? COVID-19 Vaccine (4 - Booster) 12/13/2021*  ? Tetanus Vaccine  12/23/2021  ? Flu Shot  03/09/2022  ? Mammogram  11/10/2023  ? Colon Cancer Screening  12/11/2030  ? DEXA scan (bone density measurement)  Completed  ? Hepatitis C Screening: USPSTF Recommendation to screen - Ages 20-79 yo.  Completed  ? HPV Vaccine  Aged Out  ? Pneumonia Vaccine  Discontinued  ? Zoster (Shingles) Vaccine  Discontinued  ?*Topic was postponed. The date shown is not the original due date.  ?  ?

## 2021-11-27 NOTE — Progress Notes (Signed)
Subjective:   Linda Schmitt is a 66 y.o. female who presents for Medicare Annual (Subsequent) preventive examination.  Review of Systems    No ROS.  Medicare Wellness Virtual Visit.  Visual/audio telehealth visit, UTA vital signs.   See social history for additional risk factors.   Cardiac Risk Factors include: advanced age (>56men, >62 women)     Objective:    Today's Vitals   11/27/21 0835  Weight: 228 lb (103.4 kg)  Height: 5' 4.25" (1.632 m)   Body mass index is 38.83 kg/m.     11/27/2021    8:39 AM 10/23/2021    9:18 AM 10/15/2021   10:20 AM 07/16/2021   10:36 AM 05/06/2021   10:00 AM 03/25/2021    9:51 AM 02/18/2021    9:35 AM  Advanced Directives  Does Patient Have a Medical Advance Directive? No No No No No No No  Would patient like information on creating a medical advance directive? No - Patient declined No - Patient declined         Current Medications (verified) Outpatient Encounter Medications as of 11/27/2021  Medication Sig   cholecalciferol (VITAMIN D3) 10 MCG (400 UNIT) TABS tablet Take 800 Units by mouth daily.   cyanocobalamin 1000 MCG tablet Take 400 mcg by mouth daily.   nortriptyline (PAMELOR) 10 MG capsule TAKE 2 CAPSULES BY MOUTH AT BEDTIME   olaparib (LYNPARZA) 150 MG tablet Take 2 tablets (300 mg total) by mouth 2 (two) times daily. May take with food to decrease nausea and vomiting.   pravastatin (PRAVACHOL) 40 MG tablet Take 1 tablet (40 mg total) by mouth every evening.   pregabalin (LYRICA) 50 MG capsule Take 1 capsule (50 mg total) by mouth 2 (two) times daily.   No facility-administered encounter medications on file as of 11/27/2021.    Allergies (verified) Patient has no known allergies.   History: Past Medical History:  Diagnosis Date   Cancer (HCC)    ovarian cancer   Family history of breast cancer    Genetic testing 04/09/2020   No pathogenic variants identified on the [employer] My[facility] (germline) panel. The report date is  04/07/2020.   The [facility] gene panel offered by [employer] includes sequencing and deletion/duplication testing of the following 35 genes: APC, ATM, AXIN2, BARD1, BMPR1A, BRCA1, BRCA2, BRIP1, CHD1, CDK4, CDKN2A, CHEK2, EPCAM (large rearrangement only), HOXB13, GALNT12, MLH1, MSH2, MSH3, MSH6   HLD (hyperlipidemia)    Past Surgical History:  Procedure Laterality Date   CARPAL TUNNEL RELEASE Left    COLONOSCOPY WITH PROPOFOL N/A 12/10/2020   Procedure: COLONOSCOPY WITH PROPOFOL;  Surgeon: [name], MD;  Location: [facility] ENDOSCOPY;  Service: Endoscopy;  Laterality: N/A;   ROBOTIC ASSISTED TOTAL HYSTERECTOMY WITH BILATERAL SALPINGO OOPHERECTOMY  06/05/2020   with omentectomy tumor debulking via mini-laparotomy/hand assisted port   SHOULDER ARTHROSCOPY WITH SUBACROMIAL DECOMPRESSION AND OPEN ROTATOR C Right 02/01/2020   Procedure: RIGHT SHOULDER ARTHROSCOPIC SUBSCAPULARIS REPAIR, ROTATOR CUFF REPAIR, SUABCROMIAL DECOMPRESSION, AND BICEPS TENODESIS.;  Surgeon: Signa Kell, MD;  Location: [facility] ORS;  Service: Orthopedics;  Laterality: Right;   TUBAL LIGATION     Family History  Problem Relation Age of Onset   Cancer Mother        lymphoma   Stroke Sister    Cancer Brother        lung   Cancer Sister        lung   Lung cancer Brother    Bone cancer Sister  Breast cancer Cousin 45       maternal   Cancer Maternal Aunt        unk types   Social History   Socioeconomic History   Marital status: Single    Spouse name: Not on file   Number of children: Not on file   Years of education: Not on file   Highest education level: Not on file  Occupational History   Not on file  Tobacco Use   Smoking status: Former    Packs/day: 0.75    Years: 40.00    Pack years: 30.00    Types: Cigarettes    Quit date: 02/06/2018    Years since quitting: 3.8   Smokeless tobacco: Never   Tobacco comments:    quit 02/2018  Vaping Use   Vaping Use: Never used  Substance and  Sexual Activity   Alcohol use: Yes    Comment: 2-3 glasses of wine per week,none last 24hrs   Drug use: No   Sexual activity: Not on file  Other Topics Concern   Not on file  Social History Narrative   Lives in [place] alone. Work - [employer]   Diet: Regular   Exercise: walking   Social Determinants of Corporate investment banker Strain: Low Risk    Difficulty of Paying Living Expenses: Not hard at all  Food Insecurity: No Food Insecurity   Worried About Programme researcher, broadcasting/film/video in the Last Year: Never true   Barista in the Last Year: Never true  Transportation Needs: No Transportation Needs   Lack of Transportation (Medical): No   Lack of Transportation (Non-Medical): No  Physical Activity: Not on file  Stress: No Stress Concern Present   Feeling of Stress : Not at all  Social Connections: Unknown   Frequency of Communication with Friends and Family: More than three times a week   Frequency of Social Gatherings with Friends and Family: More than three times a week   Attends Religious Services: Not on file   Active Member of Clubs or Organizations: Not on file   Attends Banker Meetings: Not on file   Marital Status: Not on file    Tobacco Counseling Counseling given: Not Answered Tobacco comments: quit 02/2018   Clinical Intake:  Pre-visit preparation completed: Yes        Diabetes: No  How often do you need to have someone help you when you read instructions, pamphlets, or other written materials from your doctor or pharmacy?: 1 - Never  Interpreter Needed?: No      Activities of Daily Living    11/27/2021    8:41 AM  In your present state of health, do you have any difficulty performing the following activities:  Hearing? 0  Vision? 0  Difficulty concentrating or making decisions? 0  Walking or climbing stairs? 0  Dressing or bathing? 0  Doing errands, shopping? 0  Preparing Food and eating ? N  Using the Toilet? N  In the past  six months, have you accidently leaked urine? N  Do you have problems with loss of bowel control? N  Managing your Medications? N  Managing your Finances? N  Housekeeping or managing your Housekeeping? N    Patient Care Team: [name], FNP as PCP - General (Family Medicine) [name], RN as Oncology Nurse Navigator [name], MD as Consulting Physician (Oncology) [name], MD as Referring Physician (Gynecologic Oncology) [name], MD as  Referring Physician (Gynecologic Oncology) [name], MD (Inactive) as Consulting Physician (Gastroenterology)  Indicate any recent Medical Services you may have received from other than [facility] providers in the past year (date may be approximate).     Assessment:   This is a routine wellness examination for [place].  Virtual Visit via Telephone Note  I connected with  Linda Schmitt on 11/27/21 at  8:30 AM EDT by telephone and verified that I am speaking with the correct person using two identifiers.  Persons participating in the virtual visit: patient/Nurse Health Advisor   I discussed the limitations of performing an evaluation and management service by telehealth. The patient expressed understanding and agreed to proceed. We continued and completed visit with audio only.Some vital signs may be absent or patient reported.   Hearing/Vision screen Hearing Screening - Comments:: Patient is able to hear conversational tones without difficulty. No issues reported. Vision Screening - Comments:: Wears readers only. Plans to schedule visit with ophthalmologist.   Dietary issues and exercise activities discussed: Current Exercise Habits: Home exercise routine, Type of exercise: walking, Intensity: Mild  Regular diet Good water intake   Goals Addressed               This Visit's Progress     Patient Stated     Reduce sugar intake (pt-stated)        Healthy diet       Depression  Screen    11/27/2021    8:38 AM 02/22/2020   11:21 AM 11/21/2019    9:49 AM 10/11/2016    8:32 AM  PHQ 2/9 Scores  PHQ - 2 Score 0 0 0 0    Fall Risk    11/27/2021    8:41 AM 02/22/2020   11:21 AM 11/21/2019    9:50 AM 05/30/2019    8:49 AM 10/11/2016    8:32 AM  Fall Risk   Falls in the past year? 0 1 1 1  No  Number falls in past yr: 0 0 0 0   Comment   Missed a step when walking down the stair.    Injury with Fall?  1 1    Comment   R shoulder injury; sling currently worn. Sought medical advice with Urgent Care out of town. Followed by [facility].    Follow up Falls evaluation completed Falls evaluation completed Falls evaluation completed Falls evaluation completed     FALL RISK PREVENTION PERTAINING TO THE HOME:  Home free of loose throw rugs in walkways, pet beds, electrical cords, etc? Yes  Adequate lighting in your home to reduce risk of falls? Yes   ASSISTIVE DEVICES UTILIZED TO PREVENT FALLS: Life alert? No  Use of a cane, walker or w/c? No   TIMED UP AND GO: Was the test performed? No .   Cognitive Function:  Patient is alert and oriented x3. Enjoys playing brain health stimulating games.  Manages her own medications and finances.       11/21/2019    9:55 AM  6CIT Screen  What Year? 0 points  What month? 0 points  What time? 0 points  Count back from 20 0 points  Months in reverse 0 points  Repeat phrase 4 points  Total Score 4 points    Immunizations Immunization History  Administered Date(s) Administered   PFIZER Comirnaty(Gray Top)Covid-19 Tri-Sucrose Vaccine 05/08/2020   PFIZER(Purple Top)SARS-COV-2 Vaccination 05/08/2020, 05/29/2020   Tdap 12/24/2011   Screening Tests Health Maintenance  Topic Date Due  COVID-19 Vaccine (4 - Booster) 12/13/2021 (Originally 07/24/2020)   TETANUS/TDAP  12/23/2021   INFLUENZA VACCINE  03/09/2022   MAMMOGRAM  11/10/2023   COLONOSCOPY (Pts 45-35yrs Insurance coverage will need to be confirmed)  12/11/2030    DEXA SCAN  Completed   Hepatitis C Screening  Completed   HPV VACCINES  Aged Out   Pneumonia Vaccine 57+ Years old  Discontinued   Zoster Vaccines- Shingrix  Discontinued   Health Maintenance There are no preventive care reminders to display for this patient.  CT Chest Abdomen W Contrast completed 11/23/21.   Vision Screening: Recommended annual ophthalmology exams for early detection of glaucoma and other disorders of the eye.  Dental Screening: Recommended annual dental exams for proper oral hygiene. Partial denture worn.   Community Resource Referral / Chronic Care Management: CRR required this visit?  No   CCM required this visit?  No      Plan:   Keep all routine maintenance appointments.   I have personally reviewed and noted the following in the patient's chart:   Medical and social history Use of alcohol, tobacco or illicit drugs  Current medications and supplements including opioid prescriptions.  Functional ability and status Nutritional status Physical activity Advanced directives List of other physicians Hospitalizations, surgeries, and ER visits in previous 12 months Vitals Screenings to include cognitive, depression, and falls Referrals and appointments  In addition, I have reviewed and discussed with patient certain preventive protocols, quality metrics, and best practice recommendations. A written personalized care plan for preventive services as well as general preventive health recommendations were provided to patient.     Ashok Pall, LPN   1/61/0960

## 2021-12-01 ENCOUNTER — Encounter: Payer: Self-pay | Admitting: Oncology

## 2021-12-01 ENCOUNTER — Inpatient Hospital Stay (HOSPITAL_BASED_OUTPATIENT_CLINIC_OR_DEPARTMENT_OTHER): Payer: Medicare Other | Admitting: Oncology

## 2021-12-01 ENCOUNTER — Inpatient Hospital Stay: Payer: Medicare Other | Attending: Oncology

## 2021-12-01 VITALS — BP 150/72 | HR 63 | Temp 96.6°F | Resp 18 | Wt 227.0 lb

## 2021-12-01 DIAGNOSIS — C763 Malignant neoplasm of pelvis: Secondary | ICD-10-CM | POA: Diagnosis not present

## 2021-12-01 DIAGNOSIS — Z5111 Encounter for antineoplastic chemotherapy: Secondary | ICD-10-CM

## 2021-12-01 DIAGNOSIS — G629 Polyneuropathy, unspecified: Secondary | ICD-10-CM

## 2021-12-01 DIAGNOSIS — D539 Nutritional anemia, unspecified: Secondary | ICD-10-CM | POA: Diagnosis not present

## 2021-12-01 DIAGNOSIS — C5700 Malignant neoplasm of unspecified fallopian tube: Secondary | ICD-10-CM | POA: Insufficient documentation

## 2021-12-01 LAB — COMPREHENSIVE METABOLIC PANEL
ALT: 17 U/L (ref 0–44)
AST: 21 U/L (ref 15–41)
Albumin: 4.1 g/dL (ref 3.5–5.0)
Alkaline Phosphatase: 48 U/L (ref 38–126)
Anion gap: 6 (ref 5–15)
BUN: 9 mg/dL (ref 8–23)
CO2: 27 mmol/L (ref 22–32)
Calcium: 9 mg/dL (ref 8.9–10.3)
Chloride: 105 mmol/L (ref 98–111)
Creatinine, Ser: 1.1 mg/dL — ABNORMAL HIGH (ref 0.44–1.00)
GFR, Estimated: 55 mL/min — ABNORMAL LOW (ref >60)
Glucose, Bld: 99 mg/dL (ref 70–99)
Potassium: 3.4 mmol/L — ABNORMAL LOW (ref 3.5–5.1)
Sodium: 138 mmol/L (ref 135–145)
Total Bilirubin: 0.7 mg/dL (ref 0.3–1.2)
Total Protein: 7.2 g/dL (ref 6.5–8.1)

## 2021-12-01 LAB — CBC WITH DIFFERENTIAL/PLATELET
Abs Immature Granulocytes: 0.01 10*3/uL (ref 0.00–0.07)
Basophils Absolute: 0 10*3/uL (ref 0.0–0.1)
Basophils Relative: 0 %
Eosinophils Absolute: 0.1 10*3/uL (ref 0.0–0.5)
Eosinophils Relative: 2 %
HCT: 34.6 % — ABNORMAL LOW (ref 36.0–46.0)
Hemoglobin: 11.4 g/dL — ABNORMAL LOW (ref 12.0–15.0)
Immature Granulocytes: 0 %
Lymphocytes Relative: 39 %
Lymphs Abs: 1.4 10*3/uL (ref 0.7–4.0)
MCH: 34.4 pg — ABNORMAL HIGH (ref 26.0–34.0)
MCHC: 32.9 g/dL (ref 30.0–36.0)
MCV: 104.5 fL — ABNORMAL HIGH (ref 80.0–100.0)
Monocytes Absolute: 0.4 10*3/uL (ref 0.1–1.0)
Monocytes Relative: 10 %
Neutro Abs: 1.8 10*3/uL (ref 1.7–7.7)
Neutrophils Relative %: 49 %
Platelets: 164 10*3/uL (ref 150–400)
RBC: 3.31 MIL/uL — ABNORMAL LOW (ref 3.87–5.11)
RDW: 15.6 % — ABNORMAL HIGH (ref 11.5–15.5)
WBC: 3.7 10*3/uL — ABNORMAL LOW (ref 4.0–10.5)
nRBC: 0 % (ref 0.0–0.2)

## 2021-12-01 NOTE — Progress Notes (Signed)
? ? ? ?Hematology/Oncology Progress note ?[phone] [phone] ?  ? ? ?Patient Care Team: ?Burnard Hawthorne, FNP as PCP - General (Family Medicine) ?Clent Jacks, RN as Oncology Nurse Navigator ?Earlie Server, MD as Consulting Physician (Oncology) ?Mellody Drown, MD as Referring Physician (Gynecologic Oncology) ?Gillis Ends, MD as Referring Physician (Gynecologic Oncology) ?Virgel Manifold, MD (Inactive) as Consulting Physician (Gastroenterology)  ? ?Name of the patient: Linda Schmitt  ?[id number]  ?09/22/1955  ? ?REASON FOR VISIT ? follow-up for stage IV ovarian cancer, ? ?PERTINENT ONCOLOGY HISTORY ?Linda Schmitt is a 66 y.o.afemale who has above oncology history reviewed by me today presented for follow up visit for management of malignant pleural effusion ?status post thoracentesis x2 during her recent admission. ?Cytology was positive for metastatic carcinoma.  TTF-1 Napsin A, GATA3, CDX2 were negative. ?Positive for PAX 8, which is most often expressing tumors of gynecological and renal origin. ?PET scan was independently reviewed by me and discussed with patient.  I also discussed with radiology. ?Exam showed evidence of peritoneal disease within the abdomen, soft tissue infiltrating into the omentum.  Large loculated left pleural effusion with thin FDG avid rind of soft tissue overlying the left lung.  Small right pleural effusion with mild FDG uptake is equivocal for malignant effusion of the right hemithorax. ?Fluid density structure within the right side of the pelvis abutting the right-sided of uterus is noted.  This is indeterminate.  Further investigation with pelvic sonogram may be helpful. ? ?Pelvic ultrasound was obtained.-Heterogeneous myometrium with poor definition of endometrial complex. ?Normal left ovary.  No cystic right adnexal lesion is identified.  No a  probable small normal-appearing right ovary is identified.  Suspect that the low-attenuation presumed cystic structure seen in the right adnexa on the prior image corresponds to a small amount of nonspecific free fluid in the right adnexa. ?I discussed with gynecology oncology Dr.Secord.  Given that cytology from left pleural effusion is positive for metastatic carcinoma, tumor cells are positive for PAX 8 which indicates GYN origin.  Given that patient has moderate to large amount of pleural effusion, heavy tumor burden, recommend to start chemotherapy as soon as possible with carboplatin AUC 5-6 and Taxol 175 mg/m? every 3 weeks for 3 cycles.  ? ?#Pre-existing neuropathy of left hand, she takes gabapentin. ?# seen by Dr. Theora Gianotti on 04/30/2020 and she recommends to proceed with the fourth cycle of chemotherapy and patient will be seen by Dr. Theora Gianotti again on 05/27/2020 for debulking surgery plan on 06/05/2020 with cardiothoracic surgery for thoracoscopy possible thoracic tumor debulking.  Patient was recommended to have to repeat CT of the chest after her chemotherapy. ? ?Oncologic chemotherapy treatments ?03/04/2020- 9/28/201 4 cycles of neoadjuvant carbo and Taxol ? ?06/05/2020, status post Total hysterectomy and bilateral salpingo-oophorectomy, Omentectomy, and Optimal (R<1) interval tumor debulking. ?A. Peritoneal nodule, excisional biopsy: ?Fibroadipose tissue with calcifications, negative for tumor. ?B. Uterus, bilateral ovaries and fallopian tubes, hysterectomy and bilateral salpingo-oophorectomy: ?Cervix: ?Squamous metaplasia. ?Endometrium: ?Inactive endometrium. ?Myometrium: ?Adenomyosis. ?Serosa: ?High grade serous adenocarcinoma. ?Right ovary: ?High grade serous adenocarcinoma, involving the ovarian surface and parenchyma. ?Right fallopian tube: ?No specific pathologic change. ?Left ovary: ?High grade serous adenocarcinoma, involving the ovarian surface and parenchyma. ?Left fallopian tube: ?High grade  serous adenocarcinoma, involving tubal fimbria. ?Serous tubal intraepithelial carcinoma. ?Immunohistochemistry was performed on block B14 at [facility] to characterize the pathologic process and demonstrates the following immunophenotype in the cells of interest:POSITIVE:  p53, p16 (strong, patchy), Ki67 (elevated) ?This  staining profile supports the above diagnosis. ?C. Peritoneal nodule, left pericolic gutter, excisional biopsy: ?High grade serous adenocarcinoma. ?D. Cul-de-sac, excisional biopsy: ?High grade serous adenocarcinoma. ?E. Omentum, excision: ?High grade serous adenocarcinoma (up to 3.0 cm). ? ?TUMOR  ? Tumor Site:    Left fallopian tube  ? Histologic Type:    Serous carcinoma  ? Histologic Grade:    High grade  ? Tumor Size:    Greatest Dimension (Centimeters): 0.2 cm  ? Ovarian Surface Involvement:    Present  ?   Laterality:    Bilateral  ? Fallopian Tube Surface Involvement:    Present  ?   Laterality:    Left  ? Implants:    Present (sites): left peritoneum, cul-de-sac, omentum  ? Other Tissue / Organ Involvement:    Right ovary  ? Other Tissue / Organ Involvement:    Left ovary  ? Other Tissue / Organ Involvement:    Left fallopian tube  ? Other Tissue / Organ Involvement:    Pelvic peritoneum  ? Other Tissue / Organ Involvement:    Omentum  ? Largest Extrapelvic Peritoneal Focus:    Macroscopic (greater than 2 cm)  ? Peritoneal / Ascitic Fluid:    Not submitted / unknown  ? Pleural Fluid:    Not submitted / unknown  ? Treatment Effect:    Moderate response identified (CRS 2)  ? ?LYMPH NODES  ? Regional Lymph Nodes:    No lymph nodes submitted or found  ? ?PATHOLOGIC STAGE CLASSIFICATION (pTNM, AJCC 8th Edition)  ? TNM Descriptors:    y (post-treatment)  ? Primary Tumor (pT):    pT3c  ? Regional Lymph Nodes (pN):    pNX ? FIGO Stage:    IIIC  ? + pleural effusion so STAGE IV ?No germline BRCA mutation, -Homologous recombination deficiency positive.  ?Myriad Genomic instability score:  positive, somatic  BRCA1 positive ? ?07/01/20- 08/12/2020 3 cycles of adjuvant chemotherapy with carboplatin AUC 5, Taxol 175 mg/m?. ?09/02/2020, started on olaparib 300 mg twice daily. ? March 2022 followed up with gyn and  At that time patient had some suprapubic pressure and pain of unclear etiology.  A CT chest abdomen pelvis was obtained to evaluate further. ?11/14/2020 CT chest abdomen pelvis showed no residual suspicious peritoneal nodularity identified.  Unchanged small nodules adjacent to the spleen which are stable since 2017.  Interval resolution of previously seen moderate left pleural effusion.  Minimal residual pleural thickening. ? ?03/18/2021, CT showed no obvious cancer metastasis or recurrence. ?There are borderline enlarged mediastinal lymph node and a right hilar node.  Attention on follow-up ? ?07/14/2021 CT chest abdomen pelvis with contrast showed no evidence of recurrent or metastatic disease.  Borderline enlarged mediastinal lymph node and hilar lymphadenopathy, attention on follow-up.  Stable. ? ?Patient was previously on gabapentin 600 mg twice daily plus another 300 mg midday as needed.  And I added nortriptyline 10 mg at bedtime.  Due to miscommunication, patient discontinued gabapentin and took nortriptyline 10 mg.  She feels the neuropathy is not well controlled and per primary care providers recommendation, she has increased to 20 mg daily.  She still does not feel that her symptoms are well controlled. ? ?INTERVAL HISTORY ?66 year old female presents for follow-up of stage IV ovarian cancer,  somatic BRCA1 positive and HRD positive.  ?Patient has been on olaparib since January 2022.   ?Patient reports doing well today.  She tolerates olaparib.Marland Kitchen ?Chronic neuropathy, she was seen by neurology recently and  nortriptyline was switched to Lyrica.  She feels some improvement of her neuropathy symptoms.. ? ? ?Review of Systems  ?Constitutional:  Negative for appetite change, chills, fatigue and fever.  ?HENT:    Negative for hearing loss and voice change.   ?Eyes:  Negative for eye problems.  ?Respiratory:  Negative for chest tightness and cough.   ?Cardiovascular:  Negative for chest pain.  ?Gastrointestinal:  Negative for

## 2021-12-01 NOTE — Progress Notes (Signed)
Pt here for follow up. No new concerns voiced.   

## 2021-12-02 LAB — CA 125: Cancer Antigen (CA) 125: 4.5 U/mL (ref 0.0–38.1)

## 2021-12-14 ENCOUNTER — Other Ambulatory Visit: Payer: Self-pay | Admitting: Family

## 2021-12-14 DIAGNOSIS — G629 Polyneuropathy, unspecified: Secondary | ICD-10-CM

## 2021-12-31 ENCOUNTER — Encounter: Payer: Self-pay | Admitting: Oncology

## 2021-12-31 ENCOUNTER — Inpatient Hospital Stay: Payer: Medicare Other | Attending: Oncology

## 2021-12-31 ENCOUNTER — Inpatient Hospital Stay (HOSPITAL_BASED_OUTPATIENT_CLINIC_OR_DEPARTMENT_OTHER): Payer: Medicare Other | Admitting: Oncology

## 2021-12-31 VITALS — BP 160/78 | HR 63 | Temp 97.8°F | Ht 64.25 in | Wt 218.0 lb

## 2021-12-31 DIAGNOSIS — E876 Hypokalemia: Secondary | ICD-10-CM | POA: Insufficient documentation

## 2021-12-31 DIAGNOSIS — D539 Nutritional anemia, unspecified: Secondary | ICD-10-CM | POA: Insufficient documentation

## 2021-12-31 DIAGNOSIS — G620 Drug-induced polyneuropathy: Secondary | ICD-10-CM | POA: Insufficient documentation

## 2021-12-31 DIAGNOSIS — C763 Malignant neoplasm of pelvis: Secondary | ICD-10-CM

## 2021-12-31 DIAGNOSIS — Z5111 Encounter for antineoplastic chemotherapy: Secondary | ICD-10-CM

## 2021-12-31 DIAGNOSIS — C563 Malignant neoplasm of bilateral ovaries: Secondary | ICD-10-CM | POA: Diagnosis not present

## 2021-12-31 DIAGNOSIS — Z87891 Personal history of nicotine dependence: Secondary | ICD-10-CM | POA: Insufficient documentation

## 2021-12-31 DIAGNOSIS — Z79899 Other long term (current) drug therapy: Secondary | ICD-10-CM | POA: Insufficient documentation

## 2021-12-31 DIAGNOSIS — T451X5A Adverse effect of antineoplastic and immunosuppressive drugs, initial encounter: Secondary | ICD-10-CM | POA: Diagnosis not present

## 2021-12-31 DIAGNOSIS — J910 Malignant pleural effusion: Secondary | ICD-10-CM | POA: Insufficient documentation

## 2021-12-31 LAB — COMPREHENSIVE METABOLIC PANEL
ALT: 14 U/L (ref 0–44)
AST: 21 U/L (ref 15–41)
Albumin: 4.2 g/dL (ref 3.5–5.0)
Alkaline Phosphatase: 48 U/L (ref 38–126)
Anion gap: 9 (ref 5–15)
BUN: 9 mg/dL (ref 8–23)
CO2: 25 mmol/L (ref 22–32)
Calcium: 8.9 mg/dL (ref 8.9–10.3)
Chloride: 106 mmol/L (ref 98–111)
Creatinine, Ser: 1 mg/dL (ref 0.44–1.00)
GFR, Estimated: >60 mL/min (ref >60)
Glucose, Bld: 113 mg/dL — ABNORMAL HIGH (ref 70–99)
Potassium: 3.4 mmol/L — ABNORMAL LOW (ref 3.5–5.1)
Sodium: 140 mmol/L (ref 135–145)
Total Bilirubin: 0.5 mg/dL (ref 0.3–1.2)
Total Protein: 7.4 g/dL (ref 6.5–8.1)

## 2021-12-31 LAB — CBC WITH DIFFERENTIAL/PLATELET
Abs Immature Granulocytes: 0.02 10*3/uL (ref 0.00–0.07)
Basophils Absolute: 0 10*3/uL (ref 0.0–0.1)
Basophils Relative: 1 %
Eosinophils Absolute: 0.1 10*3/uL (ref 0.0–0.5)
Eosinophils Relative: 1 %
HCT: 36.1 % (ref 36.0–46.0)
Hemoglobin: 11.8 g/dL — ABNORMAL LOW (ref 12.0–15.0)
Immature Granulocytes: 1 %
Lymphocytes Relative: 28 %
Lymphs Abs: 1.1 10*3/uL (ref 0.7–4.0)
MCH: 34.3 pg — ABNORMAL HIGH (ref 26.0–34.0)
MCHC: 32.7 g/dL (ref 30.0–36.0)
MCV: 104.9 fL — ABNORMAL HIGH (ref 80.0–100.0)
Monocytes Absolute: 0.3 10*3/uL (ref 0.1–1.0)
Monocytes Relative: 8 %
Neutro Abs: 2.5 10*3/uL (ref 1.7–7.7)
Neutrophils Relative %: 61 %
Platelets: 161 10*3/uL (ref 150–400)
RBC: 3.44 MIL/uL — ABNORMAL LOW (ref 3.87–5.11)
RDW: 15.3 % (ref 11.5–15.5)
WBC: 4 10*3/uL (ref 4.0–10.5)
nRBC: 0 % (ref 0.0–0.2)

## 2021-12-31 NOTE — Progress Notes (Signed)
Hematology/Oncology Progress note [phone] [phone]     Patient Care Team: Burnard Hawthorne, FNP as PCP - General (Family Medicine) Clent Jacks, RN as Oncology Nurse Navigator Earlie Server, MD as Consulting Physician (Oncology) Mellody Drown, MD as Referring Physician (Gynecologic Oncology) Gillis Ends, MD as Referring Physician (Gynecologic Oncology) Virgel Manifold, MD (Inactive) as Consulting Physician (Gastroenterology)   Name of the patient: Linda Schmitt  [id number]  May 15, 1956   REASON FOR VISIT  follow-up for stage IV ovarian cancer,  PERTINENT ONCOLOGY HISTORY PIER LAUX is a 66 y.o.afemale who has above oncology history reviewed by me today presented for follow up visit for management of malignant pleural effusion status post thoracentesis x2 during her recent admission. Cytology was positive for metastatic carcinoma.  TTF-1 Napsin A, GATA3, CDX2 were negative. Positive for PAX 8, which is most often expressing tumors of gynecological and renal origin. PET scan was independently reviewed by me and discussed with patient.  I also discussed with radiology. Exam showed evidence of peritoneal disease within the abdomen, soft tissue infiltrating into the omentum.  Large loculated left pleural effusion with thin FDG avid rind of soft tissue overlying the left lung.  Small right pleural effusion with mild FDG uptake is equivocal for malignant effusion of the right hemithorax. Fluid density structure within the right side of the pelvis abutting the right-sided of uterus is noted.  This is indeterminate.  Further investigation with pelvic sonogram may be helpful.  Pelvic ultrasound was obtained.-Heterogeneous myometrium with poor definition of endometrial complex. Normal left ovary.  No cystic right adnexal lesion is identified.  No a  probable small normal-appearing right ovary is identified.  Suspect that the low-attenuation presumed cystic structure seen in the right adnexa on the prior image corresponds to a small amount of nonspecific free fluid in the right adnexa. I discussed with gynecology oncology Dr.Secord.  Given that cytology from left pleural effusion is positive for metastatic carcinoma, tumor cells are positive for PAX 8 which indicates GYN origin.  Given that patient has moderate to large amount of pleural effusion, heavy tumor burden, recommend to start chemotherapy as soon as possible with carboplatin AUC 5-6 and Taxol 175 mg/m every 3 weeks for 3 cycles.   #Pre-existing neuropathy of left hand, she takes gabapentin. # seen by Dr. Theora Gianotti on 04/30/2020 and she recommends to proceed with the fourth cycle of chemotherapy and patient will be seen by Dr. Theora Gianotti again on 05/27/2020 for debulking surgery plan on 06/05/2020 with cardiothoracic surgery for thoracoscopy possible thoracic tumor debulking.  Patient was recommended to have to repeat CT of the chest after her chemotherapy.  Oncologic chemotherapy treatments 03/04/2020- 9/28/201 4 cycles of neoadjuvant carbo and Taxol  06/05/2020, status post Total hysterectomy and bilateral salpingo-oophorectomy, Omentectomy, and Optimal (R<1) interval tumor debulking. A. Peritoneal nodule, excisional biopsy: Fibroadipose tissue with calcifications, negative for tumor. B. Uterus, bilateral ovaries and fallopian tubes, hysterectomy and bilateral salpingo-oophorectomy: Cervix: Squamous metaplasia. Endometrium: Inactive endometrium. Myometrium: Adenomyosis. Serosa: High grade serous adenocarcinoma. Right ovary: High grade serous adenocarcinoma, involving the ovarian surface and parenchyma. Right fallopian tube: No specific pathologic change. Left ovary: High grade serous adenocarcinoma, involving the ovarian surface and parenchyma. Left fallopian tube: High grade  serous adenocarcinoma, involving tubal fimbria. Serous tubal intraepithelial carcinoma. Immunohistochemistry was performed on block B14 at [facility] to characterize the pathologic process and demonstrates the following immunophenotype in the cells of interest:POSITIVE:  p53, p16 (strong, patchy), Ki67 (elevated) This  staining profile supports the above diagnosis. C. Peritoneal nodule, left pericolic gutter, excisional biopsy: High grade serous adenocarcinoma. D. Cul-de-sac, excisional biopsy: High grade serous adenocarcinoma. E. Omentum, excision: High grade serous adenocarcinoma (up to 3.0 cm).  TUMOR   Tumor Site:    Left fallopian tube   Histologic Type:    Serous carcinoma   Histologic Grade:    High grade   Tumor Size:    Greatest Dimension (Centimeters): 0.2 cm   Ovarian Surface Involvement:    Present     Laterality:    Bilateral   Fallopian Tube Surface Involvement:    Present     Laterality:    Left   Implants:    Present (sites): left peritoneum, cul-de-sac, omentum   Other Tissue / Organ Involvement:    Right ovary   Other Tissue / Organ Involvement:    Left ovary   Other Tissue / Organ Involvement:    Left fallopian tube   Other Tissue / Organ Involvement:    Pelvic peritoneum   Other Tissue / Organ Involvement:    Omentum   Largest Extrapelvic Peritoneal Focus:    Macroscopic (greater than 2 cm)   Peritoneal / Ascitic Fluid:    Not submitted / unknown   Pleural Fluid:    Not submitted / unknown   Treatment Effect:    Moderate response identified (CRS 2)   LYMPH NODES   Regional Lymph Nodes:    No lymph nodes submitted or found   PATHOLOGIC STAGE CLASSIFICATION (pTNM, AJCC 8th Edition)   TNM Descriptors:    y (post-treatment)   Primary Tumor (pT):    pT3c   Regional Lymph Nodes (pN):    pNX  FIGO Stage:    IIIC   + pleural effusion so STAGE IV No germline BRCA mutation, -Homologous recombination deficiency positive.  Myriad Genomic instability score:  positive, somatic  BRCA1 positive  07/01/20- 08/12/2020 3 cycles of adjuvant chemotherapy with carboplatin AUC 5, Taxol 175 mg/m. 09/02/2020, started on olaparib 300 mg twice daily.  March 2022 followed up with gyn and  At that time patient had some suprapubic pressure and pain of unclear etiology.  A CT chest abdomen pelvis was obtained to evaluate further. 11/14/2020 CT chest abdomen pelvis showed no residual suspicious peritoneal nodularity identified.  Unchanged small nodules adjacent to the spleen which are stable since 2017.  Interval resolution of previously seen moderate left pleural effusion.  Minimal residual pleural thickening.  03/18/2021, CT showed no obvious cancer metastasis or recurrence. There are borderline enlarged mediastinal lymph node and a right hilar node.  Attention on follow-up  07/14/2021 CT chest abdomen pelvis with contrast showed no evidence of recurrent or metastatic disease.  Borderline enlarged mediastinal lymph node and hilar lymphadenopathy, attention on follow-up.  Stable.  Patient was previously on gabapentin 600 mg twice daily plus another 300 mg midday as needed.  And I added nortriptyline 10 mg at bedtime.  Due to miscommunication, patient discontinued gabapentin and took nortriptyline 10 mg.  She feels the neuropathy is not well controlled and per primary care providers recommendation, she has increased to 20 mg daily.  She still does not feel that her symptoms are well controlled.  11/24/2021 CT chest abdomen pelvis with contrast showed no signs of disease recurrence.  INTERVAL HISTORY 66 year old female presents for follow-up of stage IV ovarian cancer,  somatic BRCA1 positive and HRD positive.  Patient has been on olaparib since January 2022.   Patient reports feeling well  today.  Denies any nausea vomiting diarrhea, abdominal pain.  Hepatitis good.   Review of Systems  Constitutional:  Negative for appetite change, chills, fatigue and fever.  HENT:   Negative for hearing  loss and voice change.   Eyes:  Negative for eye problems.  Respiratory:  Negative for chest tightness and cough.   Cardiovascular:  Negative for chest pain.  Gastrointestinal:  Negative for abdominal distention, abdominal pain and blood in stool.  Endocrine: Negative for hot flashes.  Genitourinary:  Negative for difficulty urinating and frequency.   Musculoskeletal:  Negative for arthralgias.  Skin:  Negative for itching and rash.  Neurological:  Positive for numbness. Negative for extremity weakness.  Hematological:  Negative for adenopathy.  Psychiatric/Behavioral:  Positive for sleep disturbance. Negative for confusion.        No Known Allergies   Past Medical History:  Diagnosis Date   Cancer ([place])    ovarian cancer   Family history of breast cancer    Genetic testing 04/09/2020   No pathogenic variants identified on the Myriad MyRisk (germline) panel. The report date is 04/07/2020.   The [facility] gene panel offered by [employer] includes sequencing and deletion/duplication testing of the following 35 genes: APC, ATM, AXIN2, BARD1, BMPR1A, BRCA1, BRCA2, BRIP1, CHD1, CDK4, CDKN2A, CHEK2, EPCAM (large rearrangement only), HOXB13, GALNT12, MLH1, MSH2, MSH3, MSH6   HLD (hyperlipidemia)      Past Surgical History:  Procedure Laterality Date   CARPAL TUNNEL RELEASE Left    COLONOSCOPY WITH PROPOFOL N/A 12/10/2020   Procedure: COLONOSCOPY WITH PROPOFOL;  Surgeon: Virgel Manifold, MD;  Location: [facility] ENDOSCOPY;  Service: Endoscopy;  Laterality: N/A;   ROBOTIC ASSISTED TOTAL HYSTERECTOMY WITH BILATERAL SALPINGO OOPHERECTOMY  06/05/2020   with omentectomy tumor debulking via mini-laparotomy/hand assisted port   SHOULDER ARTHROSCOPY WITH SUBACROMIAL DECOMPRESSION AND OPEN ROTATOR C Right 02/01/2020   Procedure: RIGHT SHOULDER ARTHROSCOPIC SUBSCAPULARIS REPAIR, ROTATOR CUFF REPAIR, SUABCROMIAL DECOMPRESSION, AND BICEPS TENODESIS.;  Surgeon: Leim Fabry, MD;  Location:  [facility] ORS;  Service: Orthopedics;  Laterality: Right;   TUBAL LIGATION      Social History   Socioeconomic History   Marital status: Single    Spouse name: Not on file   Number of children: Not on file   Years of education: Not on file   Highest education level: Not on file  Occupational History   Not on file  Tobacco Use   Smoking status: Former    Packs/day: 0.75    Years: 40.00    Pack years: 30.00    Types: Cigarettes    Quit date: 02/06/2018    Years since quitting: 3.9   Smokeless tobacco: Never   Tobacco comments:    quit 02/2018  Vaping Use   Vaping Use: Never used  Substance and Sexual Activity   Alcohol use: Yes    Comment: 2-3 glasses of wine per week,none last 24hrs   Drug use: No   Sexual activity: Not on file  Other Topics Concern   Not on file  Social History Narrative   Lives in [place] alone. Work - [employer]   Diet: Regular   Exercise: walking   Social Determinants of Radio broadcast assistant Strain: Low Risk    Difficulty of Paying Living Expenses: Not hard at all  Food Insecurity: No Food Insecurity   Worried About Charity fundraiser in the Last Year: Never true   Arboriculturist in the Last Year: Never true  Transportation Needs: No Data processing manager (Medical): No   Lack of Transportation (Non-Medical): No  Physical Activity: Not on file  Stress: No Stress Concern Present   Feeling of Stress : Not at all  Social Connections: Unknown   Frequency of Communication with Friends and Family: More than three times a week   Frequency of Social Gatherings with Friends and Family: More than three times a week   Attends Religious Services: Not on Electrical engineer or Organizations: Not on file   Attends Archivist Meetings: Not on file   Marital Status: Not on file  Intimate Partner Violence: Not At Risk   Fear of Current or Ex-Partner: No   Emotionally Abused: No   Physically Abused: No    Sexually Abused: No    Family History  Problem Relation Age of Onset   Cancer Mother        lymphoma   Stroke Sister    Cancer Brother        lung   Cancer Sister        lung   Lung cancer Brother    Bone cancer Sister    Breast cancer Cousin 31       maternal   Cancer Maternal Aunt        unk types     Current Outpatient Medications:    cholecalciferol (VITAMIN D3) 10 MCG (400 UNIT) TABS tablet, Take 800 Units by mouth daily., Disp: , Rfl:    cyanocobalamin 1000 MCG tablet, Take 400 mcg by mouth daily., Disp: , Rfl:    olaparib (LYNPARZA) 150 MG tablet, Take 2 tablets (300 mg total) by mouth 2 (two) times daily. May take with food to decrease nausea and vomiting., Disp: 120 tablet, Rfl: 2   pravastatin (PRAVACHOL) 40 MG tablet, Take 1 tablet (40 mg total) by mouth every evening., Disp: 90 tablet, Rfl: 3   pregabalin (LYRICA) 50 MG capsule, Take 1 capsule (50 mg total) by mouth 2 (two) times daily., Disp: 60 capsule, Rfl: 3  Physical exam:  Vitals:   12/31/21 0956  BP: (!) 160/78  Pulse: 63  Temp: 97.8 F (36.6 C)  TempSrc: Tympanic  Weight: 218 lb (98.9 kg)  Height: 5' 4.25" (1.632 m)   Physical Exam Constitutional:      General: She is not in acute distress.    Appearance: She is not diaphoretic.  HENT:     Head: Normocephalic and atraumatic.     Nose: Nose normal.     Mouth/Throat:     Pharynx: No oropharyngeal exudate.  Eyes:     General: No scleral icterus.    Pupils: Pupils are equal, round, and reactive to light.  Cardiovascular:     Rate and Rhythm: Normal rate and regular rhythm.     Heart sounds: No murmur heard. Pulmonary:     Effort: Pulmonary effort is normal. No respiratory distress.     Breath sounds: No rales.  Chest:     Chest wall: No tenderness.  Abdominal:     General: There is no distension.     Palpations: Abdomen is soft.     Tenderness: There is no abdominal tenderness.  Musculoskeletal:        General: Normal range of motion.      Cervical back: Normal range of motion and neck supple.  Skin:    General: Skin is warm and dry.     Findings: No erythema.  Neurological:     Mental Status: She is alert and oriented to person, place, and time.     Cranial Nerves: No cranial nerve deficit.     Motor: No abnormal muscle tone.     Coordination: Coordination normal.  Psychiatric:        Mood and Affect: Affect normal.            Latest Ref Rng & Units 12/31/2021    9:41 AM  CMP  Glucose 70 - 99 mg/dL 113    BUN 8 - 23 mg/dL 9    Creatinine 0.44 - 1.00 mg/dL 1.00    Sodium 135 - 145 mmol/L 140    Potassium 3.5 - 5.1 mmol/L 3.4    Chloride 98 - 111 mmol/L 106    CO2 22 - 32 mmol/L 25    Calcium 8.9 - 10.3 mg/dL 8.9    Total Protein 6.5 - 8.1 g/dL 7.4    Total Bilirubin 0.3 - 1.2 mg/dL 0.5    Alkaline Phos 38 - 126 U/L 48    AST 15 - 41 U/L 21    ALT 0 - 44 U/L 14        Latest Ref Rng & Units 12/31/2021    9:41 AM  CBC  WBC 4.0 - 10.5 K/uL 4.0    Hemoglobin 12.0 - 15.0 g/dL 11.8    Hematocrit 36.0 - 46.0 % 36.1    Platelets 150 - 400 K/uL 161      RADIOGRAPHIC STUDIES: I have personally reviewed the radiological images as listed and agreed with the findings in the report. CT CHEST ABDOMEN PELVIS W CONTRAST  Result Date: 11/24/2021 CLINICAL DATA:  A 66 year old female presents with history of ovarian cancer for follow-up. * Tracking Code: BO * EXAM: CT CHEST, ABDOMEN, AND PELVIS WITH CONTRAST TECHNIQUE: Multidetector CT imaging of the chest, abdomen and pelvis was performed following the standard protocol during bolus administration of intravenous contrast. RADIATION DOSE REDUCTION: This exam was performed according to the departmental dose-optimization program which includes automated exposure control, adjustment of the mA and/or kV according to patient size and/or use of iterative reconstruction technique. CONTRAST:  180m OMNIPAQUE IOHEXOL 300 MG/ML  SOLN COMPARISON:  July 14, 2021. FINDINGS: CT  CHEST FINDINGS Cardiovascular: Minimal atherosclerotic changes of the thoracic aorta. No aneurysmal dilation. Normal heart size without substantial pericardial effusion. Central pulmonary vasculature is unremarkable. Mediastinum/Nodes: No thoracic inlet, axillary, mediastinal or hilar adenopathy. Esophagus grossly normal. Small lymph nodes in the chest largest in the anterior mediastinum at 6 mm without change (image 19/2) Lungs/Pleura: Lungs are clear.  Airways are patent.  No effusion. Musculoskeletal: No acute musculoskeletal process related to the bony thorax. Visualized clavicles and scapulae without acute process. Sternum is intact. CT ABDOMEN PELVIS FINDINGS Hepatobiliary: No focal, suspicious hepatic lesion. No pericholecystic stranding. No biliary duct dilation. Portal vein is patent. Pancreas: Normal, without mass, inflammation or ductal dilatation. Spleen: Normal. Adrenals/Urinary Tract: Well-circumscribed LEFT adrenal lesion measures 1.6 cm and is unchanged dating back to October of 2017. RIGHT adrenal gland is normal. Symmetric renal enhancement without signs of hydronephrosis, suspicious renal lesion or perinephric stranding. Urinary bladder with smooth contours. Stomach/Bowel: No acute gastrointestinal process. The appendix is normal. Vascular/Lymphatic: Aortic atherosclerosis. No sign of aneurysm. Smooth contour of the IVC. There is no gastrohepatic or hepatoduodenal ligament lymphadenopathy. No retroperitoneal or mesenteric lymphadenopathy. No pelvic sidewall lymphadenopathy. Atherosclerotic changes are mild. Reproductive: Post hysterectomy.  No ascites or adnexal mass. Other: Small nodules in  the LEFT upper quadrant adjacent to the spleen display no interval change, also adjacent to splenic flexure of the colon, dominant nodule 7 mm greatest axial dimension (image 61/2) Postoperative changes in the midline. Small fat containing umbilical hernia. Musculoskeletal: No acute bone finding. No  destructive bone process. Spinal degenerative changes. IMPRESSION: 1. No new or progressive findings in the chest, abdomen or pelvis. 2. Scattered small perisplenic nodules are unchanged potentially small splenules. 3. Stable LEFT adrenal lesion, unchanged dating back to October of 2017. This likely represents an adenoma. 4. Post hysterectomy. 5. Aortic atherosclerosis. Aortic Atherosclerosis (ICD10-I70.0). Electronically Signed   By: Zetta Bills M.D.   On: 11/24/2021 08:56   DG Bone Density  Result Date: 11/09/2021 EXAM: DUAL X-RAY ABSORPTIOMETRY (DXA) FOR BONE MINERAL DENSITY IMPRESSION: Your patient Kassia Demarinis completed a BMD test on 11/09/2021 using the [place] (software version: 14.10) manufactured by [employer]. The following summarizes the results of our evaluation. Technologist: [facility] PATIENT BIOGRAPHICAL: Name: Dayna, Alia Patient ID: [id number] Birth Date: 1955/09/29 Height: 64.3 in. Gender: Female Exam Date: 11/09/2021 Weight: 224.0 lbs. Indications: History of Chemo, History of Ovarian Cancer, Hysterectomy, Oophorectomy Bilateral, Ovarian Cancer, Postmenopausal Fractures: Treatments: Vitamin D DENSITOMETRY RESULTS: Site      Region     Measured Date Measured Age WHO Classification Young Adult T-score BMD         %Change vs. Previous Significant Change (*) AP Spine L1-L3 11/09/[age] Normal 2.0 1.428 g/cm2 - - DualFemur Neck Right 11[age]09/[age] 67.7 Osteopenia -1.3 0.851 g/cm2 - - ASSESSMENT: The BMD measured at Femur Neck Right is 0.851 g/cm2 with a T-score of -1.3. This patient is considered osteopenic according to [facility]) criteria. The scan quality is good. L4 was excluded due to degenerative changes. World Pharmacologist [facility]) criteria for post-menopausal, Caucasian Women: Normal:                   T-score at or above -1 SD Osteopenia/low bone mass: T-score between -1 and -2.5 SD Osteoporosis:             T-score at or below -2.5  SD RECOMMENDATIONS: 1. All patients should optimize calcium and vitamin D intake. 2. Consider FDA-approved medical therapies in postmenopausal women and men aged 19 years and older, based on the following: a. A hip or vertebral(clinical or morphometric) fracture b. T-score < -2.5 at the femoral neck or spine after appropriate evaluation to exclude secondary causes c. Low bone mass (T-score between -1.0 and -2.5 at the femoral neck or spine) and a 10-year probability of a hip fracture > 3% or a 10-year probability of a major osteoporosis-related fracture > 20% based on the [place]-adapted WHO algorithm 3. Clinician judgment and/or patient preferences may indicate treatment for people with 10-year fracture probabilities above or below these levels FOLLOW-UP: People with diagnosed cases of osteoporosis or at high risk for fracture should have regular bone mineral density tests. For patients eligible for Medicare, routine testing is allowed once every 2 years. The testing frequency can be increased to one year for patients who have rapidly progressing disease, those who are receiving or discontinuing medical therapy to restore bone mass, or have additional risk factors. I have reviewed this report, and agree with the above findings. [facility] Dear Yvetta Coder ARNETT, Your patient ALANTIS BETHUNE completed a FRAX assessment on 11/09/2021 using the [place] (analysis version: 14.10) manufactured by [employer]. The following summarizes  the results of our evaluation. PATIENT BIOGRAPHICAL: Name: Jennavecia, Schwier Patient ID: [id number] Birth Date: [birth year] Height:    64.3 in. Gender:     [age]    [age]       Weight:    224.0 lbs. Ethnicity:  Black                            Exam Date: 11/09/2021 FRAX* RESULTS:  (version: 3.5) 10-year Probability of Fracture1 Major Osteoporotic Fracture2 Hip Fracture 3.7% 0.4% Population: [place] (Black) Risk Factors: None Based on Femur (Right) Neck BMD  1 -The 10-year probability of fracture may be lower than reported if the patient has received treatment. 2 -Major Osteoporotic Fracture: Clinical Spine, Forearm, Hip or Shoulder *FRAX is a Materials engineer of the [employer] of [employer] for Metabolic Bone Disease, a [place] (WHO) [facility]. ASSESSMENT: The probability of a major osteoporotic fracture is 3.7% within the next ten years. The probability of a hip fracture is 0.4% within the next ten years. . I have reviewed this report and agree with the above findings. Mark A. Thornton Papas, M.D. [facility] Electronically Signed   By: Lavonia Dana M.D.   On: 11/09/2021 11:42   MM 3D SCREEN BREAST BILATERAL  Result Date: 11/09/2021 CLINICAL DATA:  Screening. EXAM: DIGITAL SCREENING BILATERAL MAMMOGRAM WITH TOMOSYNTHESIS AND CAD TECHNIQUE: Bilateral screening digital craniocaudal and mediolateral oblique mammograms were obtained. Bilateral screening digital breast tomosynthesis was performed. The images were evaluated with computer-aided detection. COMPARISON:  Previous exam(s). ACR Breast Density Category a: The breast tissue is almost entirely fatty. FINDINGS: There are no findings suspicious for malignancy. IMPRESSION: No mammographic evidence of malignancy. A result letter of this screening mammogram will be mailed directly to the patient. RECOMMENDATION: Screening mammogram in one year. (Code:SM-B-01Y) BI-RADS CATEGORY  1: Negative. Electronically Signed   By: Marin Olp M.D.   On: 11/09/2021 17:05      Assessment and plan Patient is a 66 y.o. female presents for follow-up for FIGO IIIC ovarian cancer, ( somatic BRCA1 positive, HRD positive). 1. 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 Encounter for antineoplastic chemotherapy   3. Macrocytic anemia   4. Chemotherapy-induced neuropathy ([place])   5. Hypokalemia    #FIGO IVA Fallopian tube serous carcinoma- + pleural effusion cytology S/p 4 cycles of  neoadjuvant chemotherapy  Carboplatin /Taxol  S/p total hysterectomy with salpingo-oophorectomy and omentectomy. Optimal (R<1) interval tumor debulking, -followed by 3 cycles of adjuvant carboplatin/Taxol. Labs reviewed and discussed with patient.  CA125 is stable. Continue current regimen olaparib 300 mg twice daily.  #Hypokalemia, patient has potassium tablets at home.  She takes potassium rich food. #chemotherapy induced neuropathy,  Currently on Lyrica, recommended by neurology. Stable symptoms.  #Anemia,   Hemoglobin is stable.  Macrocytosis likely due to olaparib.  Monitor, previous work-up showed normal B12 and folate.   We spent sufficient time to discuss many aspect of care, questions were answered to patient's satisfaction.  Follow-up in 4 weeks   Earlie Server, MD, PhD  12/31/2021

## 2022-01-01 LAB — CA 125: Cancer Antigen (CA) 125: 4.2 U/mL (ref 0.0–38.1)

## 2022-01-23 ENCOUNTER — Other Ambulatory Visit: Payer: Self-pay | Admitting: Nurse Practitioner

## 2022-01-28 ENCOUNTER — Inpatient Hospital Stay (HOSPITAL_BASED_OUTPATIENT_CLINIC_OR_DEPARTMENT_OTHER): Payer: Medicare Other | Admitting: Oncology

## 2022-01-28 ENCOUNTER — Encounter: Payer: Self-pay | Admitting: Oncology

## 2022-01-28 ENCOUNTER — Inpatient Hospital Stay: Payer: Medicare Other | Attending: Oncology

## 2022-01-28 VITALS — BP 114/72 | HR 66 | Temp 96.8°F | Wt 215.0 lb

## 2022-01-28 DIAGNOSIS — G620 Drug-induced polyneuropathy: Secondary | ICD-10-CM | POA: Diagnosis not present

## 2022-01-28 DIAGNOSIS — C763 Malignant neoplasm of pelvis: Secondary | ICD-10-CM

## 2022-01-28 DIAGNOSIS — T451X5A Adverse effect of antineoplastic and immunosuppressive drugs, initial encounter: Secondary | ICD-10-CM | POA: Insufficient documentation

## 2022-01-28 DIAGNOSIS — C5702 Malignant neoplasm of left fallopian tube: Secondary | ICD-10-CM | POA: Insufficient documentation

## 2022-01-28 DIAGNOSIS — E876 Hypokalemia: Secondary | ICD-10-CM | POA: Insufficient documentation

## 2022-01-28 DIAGNOSIS — D649 Anemia, unspecified: Secondary | ICD-10-CM | POA: Diagnosis not present

## 2022-01-28 DIAGNOSIS — Z79899 Other long term (current) drug therapy: Secondary | ICD-10-CM | POA: Diagnosis not present

## 2022-01-28 DIAGNOSIS — J910 Malignant pleural effusion: Secondary | ICD-10-CM | POA: Diagnosis not present

## 2022-01-28 DIAGNOSIS — C5700 Malignant neoplasm of unspecified fallopian tube: Secondary | ICD-10-CM | POA: Diagnosis not present

## 2022-01-28 DIAGNOSIS — Z87891 Personal history of nicotine dependence: Secondary | ICD-10-CM | POA: Insufficient documentation

## 2022-01-28 LAB — COMPREHENSIVE METABOLIC PANEL
ALT: 14 U/L (ref 0–44)
AST: 20 U/L (ref 15–41)
Albumin: 4.3 g/dL (ref 3.5–5.0)
Alkaline Phosphatase: 46 U/L (ref 38–126)
Anion gap: 10 (ref 5–15)
BUN: 10 mg/dL (ref 8–23)
CO2: 24 mmol/L (ref 22–32)
Calcium: 9.1 mg/dL (ref 8.9–10.3)
Chloride: 105 mmol/L (ref 98–111)
Creatinine, Ser: 1.1 mg/dL — ABNORMAL HIGH (ref 0.44–1.00)
GFR, Estimated: 55 mL/min — ABNORMAL LOW (ref >60)
Glucose, Bld: 125 mg/dL — ABNORMAL HIGH (ref 70–99)
Potassium: 3.4 mmol/L — ABNORMAL LOW (ref 3.5–5.1)
Sodium: 139 mmol/L (ref 135–145)
Total Bilirubin: 1.1 mg/dL (ref 0.3–1.2)
Total Protein: 7.4 g/dL (ref 6.5–8.1)

## 2022-01-28 LAB — CBC WITH DIFFERENTIAL/PLATELET
Abs Immature Granulocytes: 0.02 10*3/uL (ref 0.00–0.07)
Basophils Absolute: 0 10*3/uL (ref 0.0–0.1)
Basophils Relative: 1 %
Eosinophils Absolute: 0.1 10*3/uL (ref 0.0–0.5)
Eosinophils Relative: 1 %
HCT: 36.2 % (ref 36.0–46.0)
Hemoglobin: 12.1 g/dL (ref 12.0–15.0)
Immature Granulocytes: 1 %
Lymphocytes Relative: 32 %
Lymphs Abs: 1.2 10*3/uL (ref 0.7–4.0)
MCH: 35 pg — ABNORMAL HIGH (ref 26.0–34.0)
MCHC: 33.4 g/dL (ref 30.0–36.0)
MCV: 104.6 fL — ABNORMAL HIGH (ref 80.0–100.0)
Monocytes Absolute: 0.2 10*3/uL (ref 0.1–1.0)
Monocytes Relative: 5 %
Neutro Abs: 2.2 10*3/uL (ref 1.7–7.7)
Neutrophils Relative %: 60 %
Platelets: 163 10*3/uL (ref 150–400)
RBC: 3.46 MIL/uL — ABNORMAL LOW (ref 3.87–5.11)
RDW: 15.1 % (ref 11.5–15.5)
WBC: 3.7 10*3/uL — ABNORMAL LOW (ref 4.0–10.5)
nRBC: 0 % (ref 0.0–0.2)

## 2022-01-28 NOTE — Assessment & Plan Note (Addendum)
+  pleural effusion cytology  -( somatic BRCA1 positive, HRD positive). S/p 4 cycles of neoadjuvant chemotherapy  Carboplatin /Taxol  S/p total hysterectomy with salpingo-oophorectomy and omentectomy. Optimal (R<1) interval tumor debulking, -followed by 3 cycles of adjuvant carboplatin/Taxol. Labs reviewed and discussed with patient.  CA125 is stable. Continue current regimen olaparib 300 mg twice daily.

## 2022-01-28 NOTE — Assessment & Plan Note (Signed)
K 3.4, stable. Encourage potassium rich food intake.

## 2022-01-29 LAB — CA 125: Cancer Antigen (CA) 125: 4.8 U/mL (ref 0.0–38.1)

## 2022-02-12 ENCOUNTER — Other Ambulatory Visit: Payer: Self-pay | Admitting: Family

## 2022-02-12 DIAGNOSIS — E785 Hyperlipidemia, unspecified: Secondary | ICD-10-CM

## 2022-02-24 ENCOUNTER — Ambulatory Visit: Payer: Medicare Other

## 2022-02-24 ENCOUNTER — Other Ambulatory Visit: Payer: Medicare Other

## 2022-02-24 ENCOUNTER — Ambulatory Visit: Payer: Medicare Other | Admitting: Oncology

## 2022-03-08 ENCOUNTER — Inpatient Hospital Stay (HOSPITAL_BASED_OUTPATIENT_CLINIC_OR_DEPARTMENT_OTHER): Payer: Medicare Other | Admitting: Oncology

## 2022-03-08 ENCOUNTER — Encounter: Payer: Self-pay | Admitting: Oncology

## 2022-03-08 ENCOUNTER — Inpatient Hospital Stay: Payer: Medicare Other | Attending: Oncology

## 2022-03-08 VITALS — BP 132/91 | HR 56 | Temp 96.7°F | Ht 64.25 in | Wt 221.0 lb

## 2022-03-08 DIAGNOSIS — C763 Malignant neoplasm of pelvis: Secondary | ICD-10-CM | POA: Diagnosis not present

## 2022-03-08 DIAGNOSIS — N183 Chronic kidney disease, stage 3 unspecified: Secondary | ICD-10-CM | POA: Insufficient documentation

## 2022-03-08 DIAGNOSIS — N1831 Chronic kidney disease, stage 3a: Secondary | ICD-10-CM

## 2022-03-08 DIAGNOSIS — C563 Malignant neoplasm of bilateral ovaries: Secondary | ICD-10-CM | POA: Insufficient documentation

## 2022-03-08 DIAGNOSIS — J90 Pleural effusion, not elsewhere classified: Secondary | ICD-10-CM | POA: Insufficient documentation

## 2022-03-08 DIAGNOSIS — Z5111 Encounter for antineoplastic chemotherapy: Secondary | ICD-10-CM

## 2022-03-08 DIAGNOSIS — Z79899 Other long term (current) drug therapy: Secondary | ICD-10-CM | POA: Insufficient documentation

## 2022-03-08 DIAGNOSIS — C5700 Malignant neoplasm of unspecified fallopian tube: Secondary | ICD-10-CM

## 2022-03-08 DIAGNOSIS — E876 Hypokalemia: Secondary | ICD-10-CM | POA: Diagnosis not present

## 2022-03-08 LAB — COMPREHENSIVE METABOLIC PANEL
ALT: 11 U/L (ref 0–44)
AST: 18 U/L (ref 15–41)
Albumin: 4 g/dL (ref 3.5–5.0)
Alkaline Phosphatase: 42 U/L (ref 38–126)
Anion gap: 7 (ref 5–15)
BUN: 14 mg/dL (ref 8–23)
CO2: 25 mmol/L (ref 22–32)
Calcium: 8.9 mg/dL (ref 8.9–10.3)
Chloride: 108 mmol/L (ref 98–111)
Creatinine, Ser: 1.1 mg/dL — ABNORMAL HIGH (ref 0.44–1.00)
GFR, Estimated: 55 mL/min — ABNORMAL LOW (ref >60)
Glucose, Bld: 114 mg/dL — ABNORMAL HIGH (ref 70–99)
Potassium: 3.7 mmol/L (ref 3.5–5.1)
Sodium: 140 mmol/L (ref 135–145)
Total Bilirubin: 0.4 mg/dL (ref 0.3–1.2)
Total Protein: 6.7 g/dL (ref 6.5–8.1)

## 2022-03-08 LAB — CBC WITH DIFFERENTIAL/PLATELET
Abs Immature Granulocytes: 0.01 10*3/uL (ref 0.00–0.07)
Basophils Absolute: 0 10*3/uL (ref 0.0–0.1)
Basophils Relative: 0 %
Eosinophils Absolute: 0.1 10*3/uL (ref 0.0–0.5)
Eosinophils Relative: 2 %
HCT: 34.1 % — ABNORMAL LOW (ref 36.0–46.0)
Hemoglobin: 11.3 g/dL — ABNORMAL LOW (ref 12.0–15.0)
Immature Granulocytes: 0 %
Lymphocytes Relative: 30 %
Lymphs Abs: 1.2 10*3/uL (ref 0.7–4.0)
MCH: 35.1 pg — ABNORMAL HIGH (ref 26.0–34.0)
MCHC: 33.1 g/dL (ref 30.0–36.0)
MCV: 105.9 fL — ABNORMAL HIGH (ref 80.0–100.0)
Monocytes Absolute: 0.3 10*3/uL (ref 0.1–1.0)
Monocytes Relative: 9 %
Neutro Abs: 2.4 10*3/uL (ref 1.7–7.7)
Neutrophils Relative %: 59 %
Platelets: 128 10*3/uL — ABNORMAL LOW (ref 150–400)
RBC: 3.22 MIL/uL — ABNORMAL LOW (ref 3.87–5.11)
RDW: 15.5 % (ref 11.5–15.5)
WBC: 4 10*3/uL (ref 4.0–10.5)
nRBC: 0 % (ref 0.0–0.2)

## 2022-03-08 MED ORDER — OLAPARIB 150 MG PO TABS: 300.0000 mg | ORAL_TABLET | Freq: Two times a day (BID) | ORAL | 2 refills | Status: DC

## 2022-03-08 NOTE — Assessment & Plan Note (Signed)
Patient takes potassium rich food.  Potassium has improved.

## 2022-03-08 NOTE — Assessment & Plan Note (Signed)
Avoid nephrotoxins. Neck CT ordered without contrast

## 2022-03-08 NOTE — Progress Notes (Addendum)
Hematology/Oncology Progress note [phone] [phone]     Patient Care Team: Burnard Hawthorne, FNP as PCP - General (Family Medicine) Clent Jacks, RN as Oncology Nurse Navigator Earlie Server, MD as Consulting Physician (Oncology) Mellody Drown, MD as Referring Physician (Gynecologic Oncology) Gillis Ends, MD as Referring Physician (Gynecologic Oncology) Virgel Manifold, MD (Inactive) as Consulting Physician (Gastroenterology)   Name of the patient: Linda Schmitt  [id number]  10/23/1955   ASSESSMENT & PLAN:   Cancer Staging  Carcinoma of fallopian tube [facility]) Staging form: Ovary, Fallopian Tube, and Primary Peritoneal Carcinoma, AJCC 8th Edition - Pathologic stage from 02/18/2020: FIGO Stage IVA (pM1a) - Signed by Earlie Server, MD on 08/12/2020   Carcinoma of fallopian tube ([place]) + pleural effusion cytology  -( somatic BRCA1 positive, HRD positive). S/p 4 cycles of neoadjuvant chemotherapy  Carboplatin /Taxol  S/p total hysterectomy with salpingo-oophorectomy and omentectomy. Optimal (R<1) interval tumor debulking, -followed by 3 cycles of adjuvant carboplatin/Taxol. Labs reviewed and discussed with patient.  CA125 is stable. Continue current regimen olaparib 300 mg twice daily. Repeat CT   Encounter for antineoplastic chemotherapy Continue current chemotherapy treatments.  Hypokalemia Patient takes potassium rich food.  Potassium has improved.  CKD (chronic kidney disease) stage 3, GFR 30-59 ml/min (HCC) Avoid nephrotoxins. Neck CT ordered without contrast   Orders Placed This Encounter  Procedures   CT CHEST ABDOMEN PELVIS WO CONTRAST    Standing Status:   Future    Standing Expiration Date:   03/09/2023    Order Specific Question:   If indicated for the ordered procedure, I authorize the administration of contrast media per Radiology  protocol    Answer:   Yes    Order Specific Question:   Preferred imaging location?    Answer:    [facility]    Order Specific Question:   Is Oral Contrast requested for this exam?    Answer:   Yes, Per Radiology protocol   CBC with Differential/Platelet    Standing Status:   Future    Standing Expiration Date:   03/09/2023   Comprehensive metabolic panel    Standing Status:   Future    Standing Expiration Date:   03/08/2023   CA 125    Standing Status:   Future    Standing Expiration Date:   03/08/2023   Follow up in 4-5 weeks  All questions were answered. The patient knows to call the clinic with any problems, questions or concerns.  Earlie Server, MD, PhD [facility] Hematology Oncology 03/08/2022    PERTINENT ONCOLOGY HISTORY Linda Schmitt is a 66 y.o.afemale who has above oncology history reviewed by me today presented for follow up visit for management of malignant pleural effusion status post thoracentesis x2 during her recent admission. Cytology was positive for metastatic carcinoma.  TTF-1 Napsin A, GATA3, CDX2 were negative. Positive for PAX 8, which is most often expressing tumors of gynecological and renal origin. PET scan was independently reviewed by me and discussed with patient.  I also discussed with radiology. Exam showed evidence of peritoneal disease within the abdomen, soft tissue infiltrating into the omentum.  Large loculated left pleural effusion with thin FDG avid rind of soft tissue overlying the left lung.  Small right pleural effusion with mild FDG uptake is equivocal for malignant effusion of the right hemithorax. Fluid density structure within the right side of the pelvis abutting the right-sided of uterus is noted.  This is indeterminate.  Further  investigation with pelvic sonogram may be helpful.  Pelvic ultrasound was obtained.-Heterogeneous myometrium with poor definition of endometrial complex. Normal left ovary.  No cystic right adnexal lesion is  identified.  No a probable small normal-appearing right ovary is identified.  Suspect that the low-attenuation presumed cystic structure seen in the right adnexa on the prior image corresponds to a small amount of nonspecific free fluid in the right adnexa. I discussed with gynecology oncology Dr.Secord.  Given that cytology from left pleural effusion is positive for metastatic carcinoma, tumor cells are positive for PAX 8 which indicates GYN origin.  Given that patient has moderate to large amount of pleural effusion, heavy tumor burden, recommend to start chemotherapy as soon as possible with carboplatin AUC 5-6 and Taxol 175 mg/m every 3 weeks for 3 cycles.   #Pre-existing neuropathy of left hand, she takes gabapentin. # seen by Dr. Theora Gianotti on 04/30/2020 and she recommends to proceed with the fourth cycle of chemotherapy and patient will be seen by Dr. Theora Gianotti again on 05/27/2020 for debulking surgery plan on 06/05/2020 with cardiothoracic surgery for thoracoscopy possible thoracic tumor debulking.  Patient was recommended to have to repeat CT of the chest after her chemotherapy.  Oncologic chemotherapy treatments 03/04/2020- 9/28/201 4 cycles of neoadjuvant carbo and Taxol  06/05/2020, status post Total hysterectomy and bilateral salpingo-oophorectomy, Omentectomy, and Optimal (R<1) interval tumor debulking. A. Peritoneal nodule, excisional biopsy: Fibroadipose tissue with calcifications, negative for tumor. B. Uterus, bilateral ovaries and fallopian tubes, hysterectomy and bilateral salpingo-oophorectomy: Cervix: Squamous metaplasia. Endometrium: Inactive endometrium. Myometrium: Adenomyosis. Serosa: High grade serous adenocarcinoma. Right ovary: High grade serous adenocarcinoma, involving the ovarian surface and parenchyma. Right fallopian tube: No specific pathologic change. Left ovary: High grade serous adenocarcinoma, involving the ovarian surface and parenchyma. Left fallopian  tube: High grade serous adenocarcinoma, involving tubal fimbria. Serous tubal intraepithelial carcinoma. Immunohistochemistry was performed on block B14 at [facility] to characterize the pathologic process and demonstrates the following immunophenotype in the cells of interest:POSITIVE:  p53, p16 (strong, patchy), Ki67 (elevated) This staining profile supports the above diagnosis. C. Peritoneal nodule, left pericolic gutter, excisional biopsy: High grade serous adenocarcinoma. D. Cul-de-sac, excisional biopsy: High grade serous adenocarcinoma. E. Omentum, excision: High grade serous adenocarcinoma (up to 3.0 cm).  TUMOR   Tumor Site:    Left fallopian tube   Histologic Type:    Serous carcinoma   Histologic Grade:    High grade   Tumor Size:    Greatest Dimension (Centimeters): 0.2 cm   Ovarian Surface Involvement:    Present     Laterality:    Bilateral   Fallopian Tube Surface Involvement:    Present     Laterality:    Left   Implants:    Present (sites): left peritoneum, cul-de-sac, omentum   Other Tissue / Organ Involvement:    Right ovary   Other Tissue / Organ Involvement:    Left ovary   Other Tissue / Organ Involvement:    Left fallopian tube   Other Tissue / Organ Involvement:    Pelvic peritoneum   Other Tissue / Organ Involvement:    Omentum   Largest Extrapelvic Peritoneal Focus:    Macroscopic (greater than 2 cm)   Peritoneal / Ascitic Fluid:    Not submitted / unknown   Pleural Fluid:    Not submitted / unknown   Treatment Effect:    Moderate response identified (CRS 2)   LYMPH NODES   Regional Lymph Nodes:    No  lymph nodes submitted or found   PATHOLOGIC STAGE CLASSIFICATION (pTNM, AJCC 8th Edition)   TNM Descriptors:    y (post-treatment)   Primary Tumor (pT):    pT3c   Regional Lymph Nodes (pN):    pNX  FIGO Stage:    IIIC   + pleural effusion so STAGE IV No germline BRCA mutation, -Homologous recombination deficiency positive.  Myriad Genomic instability score:   positive, somatic BRCA1 positive  07/01/20- 08/12/2020 3 cycles of adjuvant chemotherapy with carboplatin AUC 5, Taxol 175 mg/m. 09/02/2020, started on olaparib 300 mg twice daily.  March 2022 followed up with gyn and  At that time patient had some suprapubic pressure and pain of unclear etiology.  A CT chest abdomen pelvis was obtained to evaluate further. 11/14/2020 CT chest abdomen pelvis showed no residual suspicious peritoneal nodularity identified.  Unchanged small nodules adjacent to the spleen which are stable since 2017.  Interval resolution of previously seen moderate left pleural effusion.  Minimal residual pleural thickening.  03/18/2021, CT showed no obvious cancer metastasis or recurrence. There are borderline enlarged mediastinal lymph node and a right hilar node.  Attention on follow-up  07/14/2021 CT chest abdomen pelvis with contrast showed no evidence of recurrent or metastatic disease.  Borderline enlarged mediastinal lymph node and hilar lymphadenopathy, attention on follow-up.  Stable.  Patient was previously on gabapentin 600 mg twice daily plus another 300 mg midday as needed.  And I added nortriptyline 10 mg at bedtime.  Due to miscommunication, patient discontinued gabapentin and took nortriptyline 10 mg.  She feels the neuropathy is not well controlled and per primary care providers recommendation, she has increased to 20 mg daily.  She still does not feel that her symptoms are well controlled.  11/24/2021 CT chest abdomen pelvis with contrast showed no signs of disease recurrence.  INTERVAL HISTORY 66 year old female presents for follow-up of stage IV ovarian cancer,  somatic BRCA1 positive and HRD positive.  Patient has been on olaparib since January 2022.   Patient tolerates well.  No new complaints.  Denies any nausea vomiting diarrhea, abdominal pain.  Appetite is good. She has an upcoming cruise trip with her family members.  She looks forward to the trip.   Review of  Systems  Constitutional:  Negative for appetite change, chills, fatigue and fever.  HENT:   Negative for hearing loss and voice change.   Eyes:  Negative for eye problems.  Respiratory:  Negative for chest tightness and cough.   Cardiovascular:  Negative for chest pain.  Gastrointestinal:  Negative for abdominal distention, abdominal pain and blood in stool.  Endocrine: Negative for hot flashes.  Genitourinary:  Negative for difficulty urinating and frequency.   Musculoskeletal:  Negative for arthralgias.  Skin:  Negative for itching and rash.  Neurological:  Positive for numbness. Negative for extremity weakness.  Hematological:  Negative for adenopathy.  Psychiatric/Behavioral:  Positive for sleep disturbance. Negative for confusion.         No Known Allergies   Past Medical History:  Diagnosis Date   Cancer ([place])    ovarian cancer   Family history of breast cancer    Genetic testing 04/09/2020   No pathogenic variants identified on the Myriad MyRisk (germline) panel. The report date is 04/07/2020.   The [facility] gene panel offered by [employer] includes sequencing and deletion/duplication testing of the following 35 genes: APC, ATM, AXIN2, BARD1, BMPR1A, BRCA1, BRCA2, BRIP1, CHD1, CDK4, CDKN2A, CHEK2, EPCAM (large rearrangement only), HOXB13, GALNT12,  MLH1, MSH2, MSH3, MSH6   HLD (hyperlipidemia)      Past Surgical History:  Procedure Laterality Date   CARPAL TUNNEL RELEASE Left    COLONOSCOPY WITH PROPOFOL N/A 12/10/2020   Procedure: COLONOSCOPY WITH PROPOFOL;  Surgeon: Virgel Manifold, MD;  Location: [facility] ENDOSCOPY;  Service: Endoscopy;  Laterality: N/A;   ROBOTIC ASSISTED TOTAL HYSTERECTOMY WITH BILATERAL SALPINGO OOPHERECTOMY  06/05/2020   with omentectomy tumor debulking via mini-laparotomy/hand assisted port   SHOULDER ARTHROSCOPY WITH SUBACROMIAL DECOMPRESSION AND OPEN ROTATOR C Right 02/01/2020   Procedure: RIGHT SHOULDER ARTHROSCOPIC SUBSCAPULARIS  REPAIR, ROTATOR CUFF REPAIR, SUABCROMIAL DECOMPRESSION, AND BICEPS TENODESIS.;  Surgeon: Leim Fabry, MD;  Location: [facility] ORS;  Service: Orthopedics;  Laterality: Right;   TUBAL LIGATION      Social History   Socioeconomic History   Marital status: Single    Spouse name: Not on file   Number of children: Not on file   Years of education: Not on file   Highest education level: Not on file  Occupational History   Not on file  Tobacco Use   Smoking status: Former    Packs/day: 0.75    Years: 40.00    Total pack years: 30.00    Types: Cigarettes    Quit date: 02/06/2018    Years since quitting: 4.0   Smokeless tobacco: Never   Tobacco comments:    quit 02/2018  Vaping Use   Vaping Use: Never used  Substance and Sexual Activity   Alcohol use: Yes    Comment: 2-3 glasses of wine per week,none last 24hrs   Drug use: No   Sexual activity: Not on file  Other Topics Concern   Not on file  Social History Narrative   Lives in [place] alone. Work - [employer]   Diet: Regular   Exercise: walking   Social Determinants of Radio broadcast assistant Strain: Low Risk  (11/27/2021)   Overall Financial Resource Strain (CARDIA)    Difficulty of Paying Living Expenses: Not hard at all  Food Insecurity: No Food Insecurity (11/27/2021)   Hunger Vital Sign    Worried About Running Out of Food in the Last Year: Never true    Ran Out of Food in the Last Year: Never true  Transportation Needs: No Transportation Needs (11/27/2021)   PRAPARE - Hydrologist (Medical): No    Lack of Transportation (Non-Medical): No  Physical Activity: Not on file  Stress: No Stress Concern Present (11/27/2021)   [place]    Feeling of Stress : Not at all  Social Connections: Unknown (11/27/2021)   Social Connection and Isolation Panel [NHANES]    Frequency of Communication with Friends and Family: More than three  times a week    Frequency of Social Gatherings with Friends and Family: More than three times a week    Attends Religious Services: Not on file    Active Member of Clubs or Organizations: Not on file    Attends Archivist Meetings: Not on file    Marital Status: Not on file  Intimate Partner Violence: Not At Risk (11/27/2021)   Humiliation, Afraid, Rape, and Kick questionnaire    Fear of Current or Ex-Partner: No    Emotionally Abused: No    Physically Abused: No    Sexually Abused: No    Family History  Problem Relation Age of Onset   Cancer Mother  lymphoma   Stroke Sister    Cancer Brother        lung   Cancer Sister        lung   Lung cancer Brother    Bone cancer Sister    Breast cancer Cousin 73       maternal   Cancer Maternal Aunt        unk types     Current Outpatient Medications:    cholecalciferol (VITAMIN D3) 10 MCG (400 UNIT) TABS tablet, Take 800 Units by mouth daily., Disp: , Rfl:    cyanocobalamin 1000 MCG tablet, Take 400 mcg by mouth daily., Disp: , Rfl:    pravastatin (PRAVACHOL) 40 MG tablet, TAKE 1 TABLET BY MOUTH EVERY DAY IN THE EVENING, Disp: 90 tablet, Rfl: 3   pregabalin (LYRICA) 50 MG capsule, Take 1 capsule (50 mg total) by mouth 2 (two) times daily., Disp: 60 capsule, Rfl: 3   olaparib (LYNPARZA) 150 MG tablet, Take 2 tablets (300 mg total) by mouth 2 (two) times daily. May take with food to decrease nausea and vomiting., Disp: 120 tablet, Rfl: 2  Physical exam:  Vitals:   03/08/22 1001  BP: (!) 132/91  Pulse: (!) 56  Temp: (!) 96.7 F (35.9 C)  TempSrc: Tympanic  Weight: 221 lb (100.2 kg)  Height: 5' 4.25" (1.632 m)   Physical Exam Constitutional:      General: She is not in acute distress.    Appearance: She is not diaphoretic.  HENT:     Head: Normocephalic and atraumatic.     Nose: Nose normal.     Mouth/Throat:     Pharynx: No oropharyngeal exudate.  Eyes:     General: No scleral icterus.    Pupils: Pupils  are equal, round, and reactive to light.  Cardiovascular:     Rate and Rhythm: Normal rate and regular rhythm.     Heart sounds: No murmur heard. Pulmonary:     Effort: Pulmonary effort is normal. No respiratory distress.     Breath sounds: No rales.  Chest:     Chest wall: No tenderness.  Abdominal:     General: There is no distension.     Palpations: Abdomen is soft.     Tenderness: There is no abdominal tenderness.  Musculoskeletal:        General: Normal range of motion.     Cervical back: Normal range of motion and neck supple.  Skin:    General: Skin is warm and dry.     Findings: No erythema.  Neurological:     Mental Status: She is alert and oriented to person, place, and time.     Cranial Nerves: No cranial nerve deficit.     Motor: No abnormal muscle tone.     Coordination: Coordination normal.  Psychiatric:        Mood and Affect: Affect normal.     Labs are reviewed and discussed with patient.    Latest Ref Rng & Units 03/08/2022    9:41 AM 01/28/2022   10:55 AM 12/31/2021    9:41 AM  CBC  WBC 4.0 - 10.5 K/uL 4.0  3.7  4.0   Hemoglobin 12.0 - 15.0 g/dL 11.3  12.1  11.8   Hematocrit 36.0 - 46.0 % 34.1  36.2  36.1   Platelets 150 - 400 K/uL 128  163  161       Latest Ref Rng & Units 03/08/2022    9:41 AM 01/28/2022  10:55 AM 12/31/2021    9:41 AM  CMP  Glucose 70 - 99 mg/dL 114  125  113   BUN 8 - 23 mg/dL 14  10  9    Creatinine 0.44 - 1.00 mg/dL 1.10  1.10  1.00   Sodium 135 - 145 mmol/L 140  139  140   Potassium 3.5 - 5.1 mmol/L 3.7  3.4  3.4   Chloride 98 - 111 mmol/L 108  105  106   CO2 22 - 32 mmol/L 25  24  25    Calcium 8.9 - 10.3 mg/dL 8.9  9.1  8.9   Total Protein 6.5 - 8.1 g/dL 6.7  7.4  7.4   Total Bilirubin 0.3 - 1.2 mg/dL 0.4  1.1  0.5   Alkaline Phos 38 - 126 U/L 42  46  48   AST 15 - 41 U/L 18  20  21    ALT 0 - 44 U/L 11  14  14     RADIOGRAPHIC STUDIES: I have personally reviewed the radiological images as listed and agreed with the  findings in the report. No results found.

## 2022-03-08 NOTE — Assessment & Plan Note (Signed)
Continue current chemotherapy treatments. 

## 2022-03-08 NOTE — Assessment & Plan Note (Addendum)
+  pleural effusion cytology  -( somatic BRCA1 positive, HRD positive). S/p 4 cycles of neoadjuvant chemotherapy  Carboplatin /Taxol  S/p total hysterectomy with salpingo-oophorectomy and omentectomy. Optimal (R<1) interval tumor debulking, -followed by 3 cycles of adjuvant carboplatin/Taxol. Labs reviewed and discussed with patient.  CA125 is stable. Continue current regimen olaparib 300 mg twice daily. Repeat CT  

## 2022-03-09 LAB — CA 125: Cancer Antigen (CA) 125: 4.1 U/mL (ref 0.0–38.1)

## 2022-03-10 ENCOUNTER — Inpatient Hospital Stay: Payer: Medicare Other | Attending: Obstetrics and Gynecology | Admitting: Obstetrics and Gynecology

## 2022-03-10 VITALS — BP 127/65 | HR 62 | Resp 18 | Wt 219.0 lb

## 2022-03-10 DIAGNOSIS — C763 Malignant neoplasm of pelvis: Secondary | ICD-10-CM

## 2022-03-10 DIAGNOSIS — R102 Pelvic and perineal pain: Secondary | ICD-10-CM | POA: Diagnosis not present

## 2022-03-10 DIAGNOSIS — Z148 Genetic carrier of other disease: Secondary | ICD-10-CM | POA: Diagnosis not present

## 2022-03-10 DIAGNOSIS — Z90722 Acquired absence of ovaries, bilateral: Secondary | ICD-10-CM | POA: Diagnosis not present

## 2022-03-10 DIAGNOSIS — C5700 Malignant neoplasm of unspecified fallopian tube: Secondary | ICD-10-CM | POA: Insufficient documentation

## 2022-03-10 DIAGNOSIS — Z90710 Acquired absence of both cervix and uterus: Secondary | ICD-10-CM | POA: Insufficient documentation

## 2022-03-10 DIAGNOSIS — Z9221 Personal history of antineoplastic chemotherapy: Secondary | ICD-10-CM | POA: Insufficient documentation

## 2022-03-10 DIAGNOSIS — Z79899 Other long term (current) drug therapy: Secondary | ICD-10-CM | POA: Diagnosis not present

## 2022-03-10 NOTE — Progress Notes (Signed)
Gynecologic Oncology Interval Visit  [facility]  [phone] [phone]  Patient Care Team: Burnard Hawthorne, FNP as PCP - General (Family Medicine) Clent Jacks, RN as Oncology Nurse Navigator Earlie Server, MD as Consulting Physician (Oncology) Mellody Drown, MD as Referring Physician (Gynecologic Oncology) Gillis Ends, MD as Referring Physician (Gynecologic Oncology) Virgel Manifold, MD (Inactive) as Consulting Physician (Gastroenterology)   Name of the patient: Linda Schmitt  [id number]  08/07/1956   Date of visit: 03/10/2022  Chief Complaint: 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 Tasia Catchings  Subjective:  Linda Schmitt is a 66 y.o. G1P1 female, initially seen in consultation from Dr. 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   She saw Dr. Tasia Catchings on 7/31. CT is planned for later this month.   She continues to tolerate olaparib well without significant side effects.   CA 125 has been followed and remains low (4.1 on 03/08/22). She feels well and denies complaints.     Gynecologic Oncology History:  Linda Schmitt is a 66 y.o. female, initially seen in consultation from Dr. 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 Her history is as follows:  She presented with malignant pleural effusion two weeks after elective right shoulder surgery. She underwent thoracentesis on 02/18/2020 and 02/19/2020 with 1.8L and 1.2L removed respectively. Cytology was positive for PAX 8. PET scan showed evidence of peritoneal disease within the abdomen, soft tissue infiltrating into the omentum, large loculated left pleural effusion with  thin FDG avid rind of soft tissue overlying the left lung. Small right pleural effusion with mild FDG uptake is equivocal for malignant effusion of the right hemithorax. Fluid density structure within right side of pelvis abutting the right sided uterus noted though indeterminate. Pelvic u/s obtained showed heterogeneous myometrium with poor definition of endometrial complex. Normal left ovary.  No cystic right adnexal lesion is identified.  No a probable small normal-appearing right ovary is identified.  Suspect that the low-attenuation presumed cystic structure seen in the right adnexa on the prior image corresponds to a small amount of nonspecific free fluid in the right adnexa.  Biopsy of omental mass on 03/05/20  DIAGNOSIS:  A. OMENTAL MASS; CT-GUIDED BIOPSY:  - HIGH-GRADE SEROUS CARCINOMA.   Given that cytology from left pleural effusion is positive for metastatic carcinoma, tumor cells are positive for PAX 8 which indicates GYN origin.  Given that patient has moderate to large amount of pleural effusion, heavy tumor burden, recommend to start chemotherapy as soon as possible with carboplatin AUC 5-6 and paclitaxel 175 mg/m every 3 weeks for 3 cycles.  Patient will establish care with gynecology oncology for evaluation of participating in clinical trials after neoadjuvant chemo/surgery. CA 125 was 53.6 at time of diagnosis.   Treatment Summary: 03/04/20 Cycle 1 carboplatin-paclitaxel CA 125 53.6 03/25/20 Cycle 2 carboplatin-paclitaxel CA 125 39.9 04/15/20  Cycle 3 carboplatin-paclitaxel CA 125 10.5  04/25/20 CT C/A/P revealed moderate left malignant pleural effusion with some pleural thickening and enhancement which was decreased in size compared to prior exam. Omental caking apparent concerning for intraperitoneal metastatic disease. No definite primary malignancy identified. Stable left adrenal nodule. Aortic atherosclerosis.   05/06/2020 Cycle 4 carboplatin-paclitaxel CA125 5.8  On 06/05/2020  she underwent  exam under anesthesia, Diagnostic laparoscopy, Robot-assisted total laparoscopic hysterectomy, Bilateral salpingo-oophorectomy, Omentectomy via mini-laparotomy/hand assisted port, and Optimal (R<1) interval tumor debulking. Pathology c/w tubal origin.   Pathology: High grade serous adenocarcinoma involving bilateral ovaries and fallopian tubes; peritoneal nodules, cul-de-sac and omentum (primary site left tube). p53, p16 (strong, patchy), Ki67 (elevated)  11/22/2021Cycle #5 paclitaxel and carboplatin CA125 4.6 07/22/2020 Cycle #6 paclitaxel and carboplatin  CA125 4.16 August 2020 olaparib maintenance initiated  10/29/2020 Patient complaining of pelvic pain/pressure as well as constipation at [place] visit today. CTA ordered and completed on 11/14/2020. Included below.  IMPRESSION: 1. Status post interval hysterectomy and bilateral salpingectomy. 2. Status post interval omentectomy. There is no residual suspicious peritoneal nodularity identified. Unchanged small nodules adjacent to the spleen which are stable compared to examinations dating back to 2017 and likely small splenules and/or lymph nodes. Attention on follow-up  3. Interval resolution of a previously seen moderate left pleural effusion. There is minimal residual pleural thickening.  4. Findings are consistent with treatment response. 5. No evidence of new metastatic disease in the chest, abdomen, or pelvis.  03/18/2021, CT showed no obvious cancer metastasis or recurrence. There are borderline enlarged mediastinal lymph node and a right hilar node.  Attention on follow-up   07/14/2021 CT chest abdomen pelvis with contrast showed no evidence of recurrent or metastatic disease.  Borderline enlarged mediastinal lymph node and hilar lymphadenopathy, attention on follow-up.  Stable  CA 125 has been followed since diagnosis 02/26/2020 53.6 (high)  03/25/2020 39.9  04/15/2020 10.5 (first normal) 05/06/2020 5.8   06/30/2020 4.6 07/22/2020 4.8 08/12/2020 4.3 09/02/2020 4.3 09/23/2020 4.7  10/21/2020 6.1 11/18/2020 6.9 12/16/2020 4.7 01/20/2021 3.4 02/16/2021 4.3 03/16/2021 4.9 05/04/2021 3.9 06/01/2021 3.8  07/13/2021 4.1 08/31/2021 4.7 10/13/2021 5.2  GENETIC TEST RESULTS:   Germline Testing: 04/07/20- negative. No clinically significant mutation identified. Breast Cancer Riskscore remaining life time risk- 7%.   Somatic Testing: Genetic testing reported out on 04/08/2020 through the [facility] HRD. No pathogenic variants identified on the [facility] (germline) panel. Pathogenic variant identified through [employer] (HRD testing) in BRCA1 called [id number], HRD positive.    The [facility] gene panel offered by [employer] includes sequencing and deletion/duplication testing of the following 35 genes: APC, ATM, AXIN2, BARD1, BMPR1A, BRCA1, BRCA2, BRIP1, CHD1, CDK4, CDKN2A, CHEK2, EPCAM (large rearrangement only), HOXB13, GALNT12, MLH1, MSH2, MSH3, MSH6, MUTYH, NBN, NTHL1, PALB2, PMS2, PTEN, RAD51C, RAD51D, RNF43, RPS20, SMAD4, STK11, and TP53. Sequencing was performed for select regions of POLE and POLD1, and large rearrangement analysis was performed for select regions of GREM1.    Health Maintenance Last Screening Mammogram 11/09/2020. BI-RADS CATEGORY  1: Negative. Repeat screening mammogram in 1 year.   Last Colonoscopy completed 12/10/2020.   Problem List: Patient Active Problem List   Diagnosis Date Noted   CKD (chronic kidney disease) stage 3, GFR 30-59 ml/min (HCC) 03/08/2022   Hypokalemia 01/28/2022   Osteopenia 11/18/2021   Screen for colon cancer    Polyp of colon    Normocytic anemia 06/30/2020   Carcinoma of fallopian tube ([place]) 06/25/2020   BRCA1 gene mutation positive 04/11/2020   Genetic testing 04/09/2020   Serous carcinoma of female pelvis ([place]) 03/20/2020   Family history of breast cancer    Encounter for antineoplastic chemotherapy  03/04/2020   Chemotherapy-induced neuropathy ([place]) 03/04/2020   Mass of omentum 02/28/2020   Goals of care, counseling/discussion 02/26/2020   Metastatic carcinoma ([place]) 02/26/2020   Status post thoracentesis  Malignant pleural effusion    Pleural effusion 02/17/2020   Mass of arm, right 01/22/2020   Elevated blood pressure reading 12/28/2019   Hand tingling 12/28/2019   Acute pain of right shoulder 12/01/2018   Adrenal adenoma, left 11/11/2017   Aortic atherosclerosis ([place]) 09/23/2017   Prediabetes 07/09/2017   Ganglion of right wrist 07/06/2017   Hyperlipidemia 10/11/2016   Former smoker 09/21/2016   Depression, recurrent ([place]) 09/14/2016   Headache 09/14/2016   Routine general medical examination at a health care facility 01/14/2015   Screening for breast cancer 11/12/2013    Past Medical History: Past Medical History:  Diagnosis Date   Cancer [facility])    ovarian cancer   Family history of breast cancer    Genetic testing 04/09/2020   No pathogenic variants identified on the [employer] [facility] (germline) panel. The report date is 04/07/2020.   The [facility] gene panel offered by [employer] includes sequencing and deletion/duplication testing of the following 35 genes: APC, ATM, AXIN2, BARD1, BMPR1A, BRCA1, BRCA2, BRIP1, CHD1, CDK4, CDKN2A, CHEK2, EPCAM (large rearrangement only), HOXB13, GALNT12, MLH1, MSH2, MSH3, MSH6   HLD (hyperlipidemia)     Past Surgical History: Past Surgical History:  Procedure Laterality Date   CARPAL TUNNEL RELEASE Left    COLONOSCOPY WITH PROPOFOL N/A 12/10/2020   Procedure: COLONOSCOPY WITH PROPOFOL;  Surgeon: Virgel Manifold, MD;  Location: [facility] ENDOSCOPY;  Service: Endoscopy;  Laterality: N/A;   ROBOTIC ASSISTED TOTAL HYSTERECTOMY WITH BILATERAL SALPINGO OOPHERECTOMY  06/05/2020   with omentectomy tumor debulking via mini-laparotomy/hand assisted port   SHOULDER ARTHROSCOPY WITH SUBACROMIAL DECOMPRESSION AND OPEN ROTATOR C Right  02/01/2020   Procedure: RIGHT SHOULDER ARTHROSCOPIC SUBSCAPULARIS REPAIR, ROTATOR CUFF REPAIR, SUABCROMIAL DECOMPRESSION, AND BICEPS TENODESIS.;  Surgeon: Leim Fabry, MD;  Location: [facility] ORS;  Service: Orthopedics;  Laterality: Right;   TUBAL LIGATION      Past Gynecologic History: as per HPI   OB History:  OB History  Gravida Para Term Preterm AB Living  1 1       1   SAB IAB Ectopic Multiple Live Births               # Outcome Date GA Lbr Len/2nd Weight Sex Delivery Anes PTL Lv  1 Para             Family History: Family History  Problem Relation Age of Onset   Cancer Mother        lymphoma   Stroke Sister    Cancer Brother        lung   Cancer Sister        lung   Lung cancer Brother    Bone cancer Sister    Breast cancer Cousin 38       maternal   Cancer Maternal Aunt        unk types  Family history significant for lung cancer, lymphoma, and breast cancer.   Social History: Social History   Socioeconomic History   Marital status: Single    Spouse name: Not on file   Number of children: Not on file   Years of education: Not on file   Highest education level: Not on file  Occupational History   Not on file  Tobacco Use   Smoking status: Former    Packs/day: 0.75    Years: 40.00    Total pack years: 30.00    Types: Cigarettes    Quit date: 02/06/2018    Years since quitting: 4.0  Smokeless tobacco: Never   Tobacco comments:    quit 02/2018  Vaping Use   Vaping Use: Never used  Substance and Sexual Activity   Alcohol use: Yes    Comment: 2-3 glasses of wine per week,none last 24hrs   Drug use: No   Sexual activity: Not on file  Other Topics Concern   Not on file  Social History Narrative   Lives in [place] alone. Work - [employer]   Diet: Regular   Exercise: walking   Social Determinants of Radio broadcast assistant Strain: Low Risk  (11/27/2021)   Overall Financial Resource Strain (CARDIA)    Difficulty of Paying Living Expenses: Not hard  at all  Food Insecurity: No Food Insecurity (11/27/2021)   Hunger Vital Sign    Worried About Running Out of Food in the Last Year: Never true    Ran Out of Food in the Last Year: Never true  Transportation Needs: No Transportation Needs (11/27/2021)   PRAPARE - Hydrologist (Medical): No    Lack of Transportation (Non-Medical): No  Physical Activity: Not on file  Stress: No Stress Concern Present (11/27/2021)   [place]    Feeling of Stress : Not at all  Social Connections: Unknown (11/27/2021)   Social Connection and Isolation Panel [NHANES]    Frequency of Communication with Friends and Family: More than three times a week    Frequency of Social Gatherings with Friends and Family: More than three times a week    Attends Religious Services: Not on file    Active Member of [place] or Organizations: Not on file    Attends Archivist Meetings: Not on file    Marital Status: Not on file  Intimate Partner Violence: Not At Risk (11/27/2021)   Humiliation, Afraid, Rape, and Kick questionnaire    Fear of Current or Ex-Partner: No    Emotionally Abused: No    Physically Abused: No    Sexually Abused: No   Immunization History  Administered Date(s) Administered   PFIZER Comirnaty(Gray Top)Covid-19 Tri-Sucrose Vaccine 05/08/2020   PFIZER(Purple Top)SARS-COV-2 Vaccination 05/08/2020, 05/29/2020   Tdap 12/24/2011   Allergies: No Known Allergies  Current Medications: Current Outpatient Medications  Medication Sig Dispense Refill   cholecalciferol (VITAMIN D3) 10 MCG (400 UNIT) TABS tablet Take 800 Units by mouth daily.     cyanocobalamin 1000 MCG tablet Take 400 mcg by mouth daily.     olaparib (LYNPARZA) 150 MG tablet Take 2 tablets (300 mg total) by mouth 2 (two) times daily. May take with food to decrease nausea and vomiting. 120 tablet 2   pravastatin (PRAVACHOL) 40 MG tablet TAKE 1  TABLET BY MOUTH EVERY DAY IN THE EVENING 90 tablet 3   pregabalin (LYRICA) 50 MG capsule Take 1 capsule (50 mg total) by mouth 2 (two) times daily. 60 capsule 3   No current facility-administered medications for this visit.   Immunization History  Administered Date(s) Administered   PFIZER Comirnaty(Gray Top)Covid-19 Tri-Sucrose Vaccine 05/08/2020   PFIZER(Purple Top)SARS-COV-2 Vaccination 05/08/2020, 05/29/2020   Tdap 12/24/2011   Review of Systems General:  no complaints Skin: no complaints Eyes: no complaints HEENT: no complaints Breasts: no complaints Pulmonary: no complaints Cardiac: no complaints Gastrointestinal: no complaints Genitourinary/Sexual: no complaints Ob/Gyn: no complaints Musculoskeletal: no complaints Hematology: no complaints Neurologic/Psych: numbness & tingling   Objective:  Physical Examination:  BP 127/65 (Patient Position: Sitting)   Pulse 62  Resp 18   Wt 219 lb (99.3 kg)   BMI 37.30 kg/m     ECOG Performance Status: 1 - Symptomatic but completely ambulatory   GENERAL: Patient is a well appearing female in no acute distress HEENT:  Atraumatic and normocephalic. Neck supple. NODES:  No cervical, supraclavicular, axillary, or inguinal lymphadenopathy palpated.  ABDOMEN:  Soft, nontender. Nondistended. No masses/ascites/hernia/or hepatomegaly.  EXTREMITIES:  No peripheral edema.   SKIN:  incisions well healed NEURO:  Nonfocal. Well oriented.  Appropriate affect.  Pelvic: Exam chaperoned by RN  EGBUS: no lesions Cervix: surgically absent Vagina: no lesions, no bleeding Uterus: surgically absent BME: no palpable masses Rectovaginal: deferred   Lab Review Per HPI  Radiologic Imaging: No imaging on site today    Assessment:  DECIE VERNE is a 66 y.o. female diagnosed with stage IV high grade serous fallopian tube cancer based on malignant pleural effusion.  Underwent NACT with paclitaxel/carboplatin for 4 cycles and MIS optimal  interval debulking on 06/05/2020.  Completed 3 cycles of adjuvant carboplatin-taxol chemotherapy on 08/12/20, currently on maintenance olaparib since January 2022 with Dr Tasia Catchings. NED on exam today.    Pelvic pain/lower abdominal pain, uncertain etiology. Exam in reassuring. She has a CT scheduled already.    BRCA1 somatic mutation; HRD+  Bacterial vaginosis.- status/post flagyl. Currently asymptomatic. Small amount of discharge on exam.   Medical co-morbidities complicating care: Body mass index is 37.3 kg/m. Plan:   Problem List Items Addressed This Visit       Other   Serous carcinoma of female pelvis ([place]) - Primary   Tolerating maintenance PARP inhibitor well without significant side effects. Continue per Dr. Tasia Catchings until disease progression or unacceptable toxicity.   Pelvic pain/lower abdominal pain, uncertain etiology. Exam in reassuring. She has a CT scheduled already and follow up with Dr. Tasia Catchings.   Continue follow up with Dr. Tasia Catchings as scheduled for monitoring of use of parp inhibitor and CT results.    RTC in 3 months for follow up in [place] clinic with MD (patient prefers female providers if possible).   History of BV with small amount of asymptomatic vaginal discharge on exam, continue to monitor.   I personally had a face to face interaction and evaluated the patient jointly with the NP, Ms. Beckey Rutter.  I have reviewed her history and available records and have performed the key portions of the physical exam including HEENT, general, abdominal exam, pelvic exam with my findings confirming those documented above by the APP.  I have discussed the case with the APP and the patient.  I agree with the above documentation, assessment and plan which was fully formulated by me.  Counseling was completed by me.   A total of 25 minutes were spent with the patient/family today; >50 % was spent in education, counseling and coordination of care for h/o stage IV high grade serous fallopian tube  cancer based on malignant pleural effusion currently on PARPi.    Gautham Hewins Gaetana Michaelis, MD

## 2022-04-07 ENCOUNTER — Ambulatory Visit
Admission: RE | Admit: 2022-04-07 | Discharge: 2022-04-07 | Disposition: A | Discharge status: Home / Self Care | Payer: Medicare Other | Source: Ambulatory Visit | Attending: Oncology | Admitting: Oncology

## 2022-04-07 DIAGNOSIS — C5700 Malignant neoplasm of unspecified fallopian tube: Secondary | ICD-10-CM | POA: Insufficient documentation

## 2022-04-07 DIAGNOSIS — C763 Malignant neoplasm of pelvis: Secondary | ICD-10-CM | POA: Diagnosis not present

## 2022-04-07 DIAGNOSIS — D3500 Benign neoplasm of unspecified adrenal gland: Secondary | ICD-10-CM | POA: Diagnosis not present

## 2022-04-07 DIAGNOSIS — D3502 Benign neoplasm of left adrenal gland: Secondary | ICD-10-CM | POA: Diagnosis not present

## 2022-04-07 DIAGNOSIS — J929 Pleural plaque without asbestos: Secondary | ICD-10-CM | POA: Diagnosis not present

## 2022-04-07 DIAGNOSIS — J432 Centrilobular emphysema: Secondary | ICD-10-CM | POA: Diagnosis not present

## 2022-04-09 ENCOUNTER — Encounter: Payer: Self-pay | Admitting: Oncology

## 2022-04-09 ENCOUNTER — Inpatient Hospital Stay (HOSPITAL_BASED_OUTPATIENT_CLINIC_OR_DEPARTMENT_OTHER): Payer: Medicare Other | Admitting: Oncology

## 2022-04-09 ENCOUNTER — Telehealth: Payer: Self-pay | Admitting: Internal Medicine

## 2022-04-09 ENCOUNTER — Inpatient Hospital Stay: Payer: Medicare Other | Attending: Oncology

## 2022-04-09 VITALS — HR 69 | Temp 95.0°F | Resp 18 | Wt 216.0 lb

## 2022-04-09 DIAGNOSIS — Z5111 Encounter for antineoplastic chemotherapy: Secondary | ICD-10-CM

## 2022-04-09 DIAGNOSIS — N1831 Chronic kidney disease, stage 3a: Secondary | ICD-10-CM

## 2022-04-09 DIAGNOSIS — G629 Polyneuropathy, unspecified: Secondary | ICD-10-CM | POA: Insufficient documentation

## 2022-04-09 DIAGNOSIS — C763 Malignant neoplasm of pelvis: Secondary | ICD-10-CM

## 2022-04-09 DIAGNOSIS — N183 Chronic kidney disease, stage 3 unspecified: Secondary | ICD-10-CM | POA: Diagnosis not present

## 2022-04-09 DIAGNOSIS — Z87891 Personal history of nicotine dependence: Secondary | ICD-10-CM | POA: Diagnosis not present

## 2022-04-09 DIAGNOSIS — Z79899 Other long term (current) drug therapy: Secondary | ICD-10-CM | POA: Diagnosis not present

## 2022-04-09 DIAGNOSIS — C5700 Malignant neoplasm of unspecified fallopian tube: Secondary | ICD-10-CM | POA: Insufficient documentation

## 2022-04-09 LAB — CBC WITH DIFFERENTIAL/PLATELET
Abs Immature Granulocytes: 0.01 10*3/uL (ref 0.00–0.07)
Basophils Absolute: 0 10*3/uL (ref 0.0–0.1)
Basophils Relative: 1 %
Eosinophils Absolute: 0.1 10*3/uL (ref 0.0–0.5)
Eosinophils Relative: 1 %
HCT: 35.4 % — ABNORMAL LOW (ref 36.0–46.0)
Hemoglobin: 11.8 g/dL — ABNORMAL LOW (ref 12.0–15.0)
Immature Granulocytes: 0 %
Lymphocytes Relative: 33 %
Lymphs Abs: 1.4 10*3/uL (ref 0.7–4.0)
MCH: 34.7 pg — ABNORMAL HIGH (ref 26.0–34.0)
MCHC: 33.3 g/dL (ref 30.0–36.0)
MCV: 104.1 fL — ABNORMAL HIGH (ref 80.0–100.0)
Monocytes Absolute: 0.5 10*3/uL (ref 0.1–1.0)
Monocytes Relative: 11 %
Neutro Abs: 2.4 10*3/uL (ref 1.7–7.7)
Neutrophils Relative %: 54 %
Platelets: 175 10*3/uL (ref 150–400)
RBC: 3.4 MIL/uL — ABNORMAL LOW (ref 3.87–5.11)
RDW: 15.7 % — ABNORMAL HIGH (ref 11.5–15.5)
WBC: 4.3 10*3/uL (ref 4.0–10.5)
nRBC: 0 % (ref 0.0–0.2)

## 2022-04-09 LAB — COMPREHENSIVE METABOLIC PANEL
ALT: 15 U/L (ref 0–44)
AST: 19 U/L (ref 15–41)
Albumin: 4.4 g/dL (ref 3.5–5.0)
Alkaline Phosphatase: 46 U/L (ref 38–126)
Anion gap: 9 (ref 5–15)
BUN: 12 mg/dL (ref 8–23)
CO2: 24 mmol/L (ref 22–32)
Calcium: 9.1 mg/dL (ref 8.9–10.3)
Chloride: 104 mmol/L (ref 98–111)
Creatinine, Ser: 0.93 mg/dL (ref 0.44–1.00)
GFR, Estimated: >60 mL/min (ref >60)
Glucose, Bld: 108 mg/dL — ABNORMAL HIGH (ref 70–99)
Potassium: 3.5 mmol/L (ref 3.5–5.1)
Sodium: 137 mmol/L (ref 135–145)
Total Bilirubin: 0.7 mg/dL (ref 0.3–1.2)
Total Protein: 7.5 g/dL (ref 6.5–8.1)

## 2022-04-09 NOTE — Progress Notes (Signed)
Hematology/Oncology Progress note [phone] [phone]     Patient Care Team: Burnard Hawthorne, FNP as PCP - General (Family Medicine) Clent Jacks, RN as Oncology Nurse Navigator Earlie Server, MD as Consulting Physician (Oncology) Mellody Drown, MD as Referring Physician (Gynecologic Oncology) Gillis Ends, MD as Referring Physician (Gynecologic Oncology) Virgel Manifold, MD (Inactive) as Consulting Physician (Gastroenterology)   Name of the patient: Linda Schmitt  [id number]  1955/11/13   ASSESSMENT & PLAN:   Cancer Staging  Carcinoma of fallopian tube [facility]) Staging form: Ovary, Fallopian Tube, and Primary Peritoneal Carcinoma, AJCC 8th Edition - Pathologic stage from 02/18/2020: FIGO Stage IVA (pM1a) - Signed by Earlie Server, MD on 08/12/2020   Carcinoma of fallopian tube ([place]) + pleural effusion cytology  -( somatic BRCA1 positive, HRD positive). S/p 4 cycles of neoadjuvant chemotherapy  Carboplatin /Taxol  S/p total hysterectomy with salpingo-oophorectomy and omentectomy. Optimal (R<1) interval tumor debulking, -followed by 3 cycles of adjuvant carboplatin/Taxol. Labs reviewed and discussed with patient.  CA125 is stable. Continue current regimen olaparib 300 mg twice daily. CT shows NED   Encounter for antineoplastic chemotherapy Continue current chemotherapy treatments.  CKD (chronic kidney disease) stage 3, GFR 30-59 ml/min (HCC) Avoid nephrotoxins.   Neuropathy Lyrica does not help, she stopped.  I discussed with Dr.Vaslow, she will have a phone visit with neurology next week.     Orders Placed This Encounter  Procedures   CBC with Differential/Platelet    Standing Status:   Future    Standing Expiration Date:   04/10/2023   Comprehensive metabolic panel    Standing Status:   Future    Standing Expiration Date:   04/09/2023   CA  125    Standing Status:   Future    Standing Expiration Date:   04/09/2023   Follow up in 4-5 weeks  All questions were answered. The patient knows to call the clinic with any problems, questions or concerns.  Earlie Server, MD, PhD [facility] Hematology Oncology 04/09/2022    PERTINENT ONCOLOGY HISTORY Linda Schmitt is a 66 y.o.afemale who has above oncology history reviewed by me presents for follow up of carcinoma of fallopian tube   Oncology History  Carcinoma of fallopian tube ([place])  02/18/2020 Cancer Staging    -status post thoracentesis x2 during her recent admission. Cytology was positive for metastatic carcinoma.  TTF-1 Napsin A, GATA3, CDX2 were negative. Positive for PAX 8, which is most often expressing tumors of gynecological and renal origin Staging form: Ovary, Fallopian Tube, and Primary Peritoneal Carcinoma, AJCC 8th Edition - Pathologic stage from 02/18/2020: FIGO Stage IVA (pM1a) - Signed by Earlie Server, MD on 08/12/2020   02/25/2020 Imaging   PET scan initial staging 1. Exam shows evidence of peritoneal disease within the abdomen including FDG avid soft tissue infiltration into the omentum.  2. Large, loculated left pleural effusion with thin, FDG avid rind of soft tissue overlying the left lung. This corresponds to known, pathology proven malignant pleural effusion. 3. Small right pleural effusion with mild FDG uptake is equivocal for malignant effusion of the right hemithorax. 4. Fluid density structure within right side of pelvis abutting the right-side of the uterus is noted. This is indeterminate. Further investigation with pelvic sonogram may be helpful.   03/04/2020 - 05/06/2020 Chemotherapy   4 cycles of neoadjuvant carbo and Taxol   04/24/2020 Imaging   CT chest abdomen pelvis with contrast showed 1. Moderate left malignant pleural effusion,  decreased in size compared to the prior examination. 2. Omental caking highly concerning for intraperitoneal metastatic  disease. 3. No definite primary malignancy confidently identified on today's examination. 4. Stable nodule in the lateral limb of the left adrenal gland dating back to at least 2017, presumably a benign lesions such as an adrenal adenoma.5. Aortic atherosclerosis.6. Additional incidental findings, as above.     05/21/2020 Imaging   CT chest with contrast 1. Small left pleural effusion similar to prior CT. Overall no significant interval change compared to the prior CT. 2. Stable left adrenal nodule. 3. Aortic Atherosclerosis (ICD10-I70.0).   06/05/2020 Procedure    status post Total hysterectomy and bilateral salpingo-oophorectomy, Omentectomy, and Optimal (R<1) interval tumor debulking. A. Peritoneal nodule, excisional biopsy: Fibroadipose tissue with calcifications, negative for tumor. B. Uterus, bilateral ovaries and fallopian tubes, hysterectomy and bilateral salpingo-oophorectomy: Cervix: Squamous metaplasia. Endometrium: Inactive endometrium. Myometrium: Adenomyosis. Serosa: High grade serous adenocarcinoma. Right ovary: High grade serous adenocarcinoma, involving the ovarian surface and parenchyma. Right fallopian tube: No specific pathologic change. Left ovary: High grade serous adenocarcinoma, involving the ovarian surface and parenchyma. Left fallopian tube: High grade serous adenocarcinoma, involving tubal fimbria. Serous tubal intraepithelial carcinoma. Immunohistochemistry was performed on block B14 at [facility] to characterize the pathologic process and demonstrates the following immunophenotype in the cells of interest:POSITIVE:  p53, p16 (strong, patchy), Ki67 (elevated) This staining profile supports the above diagnosis. C. Peritoneal nodule, left pericolic gutter, excisional biopsy: High grade serous adenocarcinoma. D. Cul-de-sac, excisional biopsy: High grade serous adenocarcinoma. E. Omentum, excision: High grade serous adenocarcinoma (up to 3.0 cm).   TUMOR    Tumor Site:    Left fallopian tube   Histologic Type:    Serous carcinoma   Histologic Grade:    High grade   Tumor Size:    Greatest Dimension (Centimeters): 0.2 cm   Ovarian Surface Involvement:    Present     Laterality:    Bilateral   Fallopian Tube Surface Involvement:    Present     Laterality:    Left   Implants:    Present (sites): left peritoneum, cul-de-sac, omentum   Other Tissue / Organ Involvement:    Right ovary   Other Tissue / Organ Involvement:    Left ovary   Other Tissue / Organ Involvement:    Left fallopian tube   Other Tissue / Organ Involvement:    Pelvic peritoneum   Other Tissue / Organ Involvement:    Omentum   Largest Extrapelvic Peritoneal Focus:    Macroscopic (greater than 2 cm)   Peritoneal / Ascitic Fluid:    Not submitted / unknown   Pleural Fluid:    Not submitted / unknown   Treatment Effect:    Moderate response identified (CRS 2)   LYMPH NODES   Regional Lymph Nodes:    No lymph nodes submitted or found   PATHOLOGIC STAGE CLASSIFICATION (pTNM, AJCC 8th Edition)   TNM Descriptors:    y (post-treatment)   Primary Tumor (pT):    pT3c   Regional Lymph Nodes (pN):    pNX  FIGO Stage:    IIIC   + pleural effusion so STAGE IV No germline BRCA mutation, -Homologous recombination deficiency positive.  Myriad Genomic instability score:  positive, somatic BRCA1 positive   06/25/2020 Initial Diagnosis   Carcinoma of fallopian tube ([place])   07/01/2020 - 08/12/2020 Chemotherapy   3 cycles of adjuvant chemotherapy with carboplatin AUC 5, Taxol 175 mg/m.     09/02/2020 -  Chemotherapy   started on olaparib 300 mg twice daily.   11/16/2020 Imaging   CT chest abdomen pelvis with contrast showed 1. Status post interval hysterectomy and bilateral salpingectomy. 2. Status post interval omentectomy. There is no residual suspicious peritoneal nodularity identified. Unchanged small nodules adjacent to the spleen which are stable compared to examinations dating  back to 2017 and likely small splenules and/or lymph nodes. Attention on follow-up 3. Interval resolution of a previously seen moderate left pleural effusion. There is minimal residual pleural thickening. 4. Findings are consistent with treatment response.5. No evidence of new metastatic disease in the chest, abdomen, or pelvis.   Aortic Atherosclerosis (ICD10-I70.0).   03/18/2021 Imaging   CT chest abdomen pelvis with contrast showed Chest Impression: 1. No evidence metastatic disease in the thorax. 2. Stable borderline enlarged mediastinal lymph nodes and RIGHT hilar node. Recommend attention on follow-up. Abdomen / Pelvis Impression: 1. No evidence of peritoneal or omental nodal metastatic recurrence. 2. No evidence of local recurrence in the pelvis. 3. No free fluid in the abdomen pelvis.    07/14/2021 Imaging   CT chest abdomen pelvis with contrast showed 1. No evidence of new or progressive disease within the chest,abdomen, or pelvis. 2. Stable borderline enlarged mediastinal and right hilar lymph nodes, nonspecific and possibly reactive. Recommend attention on follow-up imaging. 3. Moderate volume of formed stool throughout the colon suggestive of constipation. 4. Stable 1.4 cm left adrenal nodule previously characterized as an adenoma on PET-CT February 25, 2020. 5.  Aortic Atherosclerosis (ICD10-I70.0).   11/24/2021 Imaging   CT chest abdomen pelvis with contrast 1. No new or progressive findings in the chest, abdomen or pelvis.2. Scattered small perisplenic nodules are unchanged potentially small splenules. 3. Stable LEFT adrenal lesion, unchanged dating back to October of 2017. This likely represents an adenoma. 4. Post hysterectomy.5. Aortic atherosclerosis.   04/07/2022 Imaging   CT chest abdomen pelvis without contrast 1. Status post hysterectomy, oophorectomy, and omentectomy. No noncontrast evidence of recurrent or metastatic disease in the chest, abdomen, or pelvis. 2.  Multiple small nodules adjacent to the spleen in the left upper quadrant, measuring up to 0.9 x 0.7 cm. These are again most likely small accessory splenules, not previously FDG avid. Attention on follow-up. 3. Minimal emphysema, diffuse bilateral bronchial wall thickening, and background of very fine centrilobular nodules, most concentrated in the lung apices, consistent with smoking-related respiratory bronchiolitis.  Aortic Atherosclerosis (ICD10-I70.0) and Emphysema (ICD10-J43.9).      INTERVAL HISTORY 66 year old female presents for follow-up of stage IV ovarian cancer,  somatic BRCA1 positive and HRD positive.  Patient has been on olaparib since January 2022.   Patient tolerates well.  No new complaints.  Denies any nausea vomiting diarrhea, abdominal pain.  Appetite is good. She has an upcoming cruise trip with her family members.  She looks forward to the trip.   Review of Systems  Constitutional:  Negative for appetite change, chills, fatigue and fever.  HENT:   Negative for hearing loss and voice change.   Eyes:  Negative for eye problems.  Respiratory:  Negative for chest tightness and cough.   Cardiovascular:  Negative for chest pain.  Gastrointestinal:  Negative for abdominal distention, abdominal pain and blood in stool.  Endocrine: Negative for hot flashes.  Genitourinary:  Negative for difficulty urinating and frequency.   Musculoskeletal:  Negative for arthralgias.  Skin:  Negative for itching and rash.  Neurological:  Positive for numbness. Negative for extremity weakness.  Hematological:  Negative for  adenopathy.  Psychiatric/Behavioral:  Positive for sleep disturbance. Negative for confusion.         No Known Allergies   Past Medical History:  Diagnosis Date   Cancer ([place])    ovarian cancer   Family history of breast cancer    Genetic testing 04/09/2020   No pathogenic variants identified on the Myriad MyRisk (germline) panel. The report date is 04/07/2020.    [facility] gene panel offered by [employer] includes sequencing and deletion/duplication testing of the following 35 genes: APC, ATM, AXIN2, BARD1, BMPR1A, BRCA1, BRCA2, BRIP1, CHD1, CDK4, CDKN2A, CHEK2, EPCAM (large rearrangement only), HOXB13, GALNT12, MLH1, MSH2, MSH3, MSH6   HLD (hyperlipidemia)      Past Surgical History:  Procedure Laterality Date   CARPAL TUNNEL RELEASE Left    COLONOSCOPY WITH PROPOFOL N/A 12/10/2020   Procedure: COLONOSCOPY WITH PROPOFOL;  Surgeon: Virgel Manifold, MD;  Location: [facility] ENDOSCOPY;  Service: Endoscopy;  Laterality: N/A;   ROBOTIC ASSISTED TOTAL HYSTERECTOMY WITH BILATERAL SALPINGO OOPHERECTOMY  06/05/2020   with omentectomy tumor debulking via mini-laparotomy/hand assisted port   SHOULDER ARTHROSCOPY WITH SUBACROMIAL DECOMPRESSION AND OPEN ROTATOR C Right 02/01/2020   Procedure: RIGHT SHOULDER ARTHROSCOPIC SUBSCAPULARIS REPAIR, ROTATOR CUFF REPAIR, SUABCROMIAL DECOMPRESSION, AND BICEPS TENODESIS.;  Surgeon: Leim Fabry, MD;  Location: [facility] ORS;  Service: Orthopedics;  Laterality: Right;   TUBAL LIGATION      Social History   Socioeconomic History   Marital status: Single    Spouse name: Not on file   Number of children: Not on file   Years of education: Not on file   Highest education level: Not on file  Occupational History   Not on file  Tobacco Use   Smoking status: Former    Packs/day: 0.75    Years: 40.00    Total pack years: 30.00    Types: Cigarettes    Quit date: 02/06/2018    Years since quitting: 4.1   Smokeless tobacco: Never   Tobacco comments:    quit 02/2018  Vaping Use   Vaping Use: Never used  Substance and Sexual Activity   Alcohol use: Yes    Comment: 2-3 glasses of wine per week,none last 24hrs   Drug use: No   Sexual activity: Not on file  Other Topics Concern   Not on file  Social History Narrative   Lives in [place] alone. Work - [employer]   Diet: Regular   Exercise: walking   Social  Determinants of Radio broadcast assistant Strain: Low Risk  (11/27/2021)   Overall Financial Resource Strain (CARDIA)    Difficulty of Paying Living Expenses: Not hard at all  Food Insecurity: No Food Insecurity (11/27/2021)   Hunger Vital Sign    Worried About Running Out of Food in the Last Year: Never true    Ran Out of Food in the Last Year: Never true  Transportation Needs: No Transportation Needs (11/27/2021)   PRAPARE - Hydrologist (Medical): No    Lack of Transportation (Non-Medical): No  Physical Activity: Not on file  Stress: No Stress Concern Present (11/27/2021)   [place]    Feeling of Stress : Not at all  Social Connections: Unknown (11/27/2021)   Social Connection and Isolation Panel [NHANES]    Frequency of Communication with Friends and Family: More than three times a week    Frequency of Social Gatherings with Friends and Family: More than  three times a week    Attends Religious Services: Not on file    Active Member of Clubs or Organizations: Not on file    Attends Club or Organization Meetings: Not on file    Marital Status: Not on file  Intimate Partner Violence: Not At Risk (11/27/2021)   Humiliation, Afraid, Rape, and Kick questionnaire    Fear of Current or Ex-Partner: No    Emotionally Abused: No    Physically Abused: No    Sexually Abused: No    Family History  Problem Relation Age of Onset   Cancer Mother        lymphoma   Stroke Sister    Cancer Brother        lung   Cancer Sister        lung   Lung cancer Brother    Bone cancer Sister    Breast cancer Cousin 105       maternal   Cancer Maternal Aunt        unk types     Current Outpatient Medications:    cholecalciferol (VITAMIN D3) 10 MCG (400 UNIT) TABS tablet, Take 800 Units by mouth daily., Disp: , Rfl:    cyanocobalamin 1000 MCG tablet, Take 400 mcg by mouth daily., Disp: , Rfl:     olaparib (LYNPARZA) 150 MG tablet, Take 2 tablets (300 mg total) by mouth 2 (two) times daily. May take with food to decrease nausea and vomiting., Disp: 120 tablet, Rfl: 2   pravastatin (PRAVACHOL) 40 MG tablet, TAKE 1 TABLET BY MOUTH EVERY DAY IN THE EVENING, Disp: 90 tablet, Rfl: 3   pregabalin (LYRICA) 50 MG capsule, Take 1 capsule (50 mg total) by mouth 2 (two) times daily., Disp: 60 capsule, Rfl: 3  Physical exam:  Vitals:   04/09/22 1147  Pulse: 69  Resp: 18  Temp: (!) 95 F (35 C)  SpO2: 100%  Weight: 216 lb (98 kg)   Physical Exam Constitutional:      General: She is not in acute distress.    Appearance: She is not diaphoretic.  HENT:     Head: Normocephalic and atraumatic.     Nose: Nose normal.     Mouth/Throat:     Pharynx: No oropharyngeal exudate.  Eyes:     General: No scleral icterus.    Pupils: Pupils are equal, round, and reactive to light.  Cardiovascular:     Rate and Rhythm: Normal rate and regular rhythm.     Heart sounds: No murmur heard. Pulmonary:     Effort: Pulmonary effort is normal. No respiratory distress.     Breath sounds: No rales.  Chest:     Chest wall: No tenderness.  Abdominal:     General: There is no distension.     Palpations: Abdomen is soft.     Tenderness: There is no abdominal tenderness.  Musculoskeletal:        General: Normal range of motion.     Cervical back: Normal range of motion and neck supple.  Skin:    General: Skin is warm and dry.     Findings: No erythema.  Neurological:     Mental Status: She is alert and oriented to person, place, and time.     Cranial Nerves: No cranial nerve deficit.     Motor: No abnormal muscle tone.     Coordination: Coordination normal.  Psychiatric:        Mood and Affect: Affect normal.  Labs are reviewed and discussed with patient.    Latest Ref Rng & Units 04/09/2022   11:28 AM 03/08/2022    9:41 AM 01/28/2022   10:55 AM  CBC  WBC 4.0 - 10.5 K/uL 4.3  4.0  3.7    Hemoglobin 12.0 - 15.0 g/dL 11.8  11.3  12.1   Hematocrit 36.0 - 46.0 % 35.4  34.1  36.2   Platelets 150 - 400 K/uL 175  128  163       Latest Ref Rng & Units 04/09/2022   11:28 AM 03/08/2022    9:41 AM 01/28/2022   10:55 AM  CMP  Glucose 70 - 99 mg/dL 108  114  125   BUN 8 - 23 mg/dL 12  14  10    Creatinine 0.44 - 1.00 mg/dL 0.93  1.10  1.10   Sodium 135 - 145 mmol/L 137  140  139   Potassium 3.5 - 5.1 mmol/L 3.5  3.7  3.4   Chloride 98 - 111 mmol/L 104  108  105   CO2 22 - 32 mmol/L 24  25  24    Calcium 8.9 - 10.3 mg/dL 9.1  8.9  9.1   Total Protein 6.5 - 8.1 g/dL 7.5  6.7  7.4   Total Bilirubin 0.3 - 1.2 mg/dL 0.7  0.4  1.1   Alkaline Phos 38 - 126 U/L 46  42  46   AST 15 - 41 U/L 19  18  20    ALT 0 - 44 U/L 15  11  14     RADIOGRAPHIC STUDIES: I have personally reviewed the radiological images as listed and agreed with the findings in the report. CT CHEST ABDOMEN PELVIS WO CONTRAST  Result Date: 04/07/2022 CLINICAL DATA:  Restaging fallopian tube cancer, status post hysterectomy, oophorectomy, salpingectomy, omentectomy, and debulking * Tracking Code: BO * EXAM: CT CHEST, ABDOMEN AND PELVIS WITHOUT CONTRAST TECHNIQUE: Multidetector CT imaging of the chest, abdomen and pelvis was performed following the standard protocol without IV contrast. Oral enteric contrast was administered RADIATION DOSE REDUCTION: This exam was performed according to the departmental dose-optimization program which includes automated exposure control, adjustment of the mA and/or kV according to patient size and/or use of iterative reconstruction technique. COMPARISON:  11/23/2021 FINDINGS: CT CHEST FINDINGS Cardiovascular: Aortic atherosclerosis. Normal heart size. No pericardial effusion. Mediastinum/Nodes: No enlarged mediastinal, hilar, or axillary lymph nodes. Unchanged small anterior mediastinal lymph node or thymic nodule measuring 0.6 cm (series 2, image 18). Thyroid gland, trachea, and esophagus  demonstrate no significant findings. Lungs/Pleura: Minimal centrilobular and paraseptal emphysema. Background of very fine centrilobular nodules, most concentrated in the lung apices. Diffuse bilateral bronchial wall thickening. No pleural effusion or pneumothorax. Musculoskeletal: No chest wall abnormality. No acute osseous findings. CT ABDOMEN PELVIS FINDINGS Hepatobiliary: No solid liver abnormality is seen. No gallstones, gallbladder wall thickening, or biliary dilatation. Pancreas: Unremarkable. No pancreatic ductal dilatation or surrounding inflammatory changes. Spleen: Normal in size without significant abnormality. Multiple small nodules adjacent to the spleen in the left upper quadrant, measuring up to 0.9 x 0.7 cm (series 2, image 57). Adrenals/Urinary Tract: Unchanged, benign adenoma of the lateral limb of the left adrenal gland measuring 1.6 x 1.5 cm (series 2, image 54). Normal right adrenal gland. Kidneys are normal, without renal calculi, solid lesion, or hydronephrosis. Bladder is unremarkable. Stomach/Bowel: Stomach is within normal limits. Appendix appears normal. No evidence of bowel wall thickening, distention, or inflammatory changes. Status post omentectomy. Vascular/Lymphatic: Aortic atherosclerosis. No enlarged  abdominal or pelvic lymph nodes. Reproductive: Status post hysterectomy and oophorectomy. Other: No abdominal wall hernia or abnormality. No ascites. Musculoskeletal: No acute osseous findings. IMPRESSION: 1. Status post hysterectomy, oophorectomy, and omentectomy. No noncontrast evidence of recurrent or metastatic disease in the chest, abdomen, or pelvis. 2. Multiple small nodules adjacent to the spleen in the left upper quadrant, measuring up to 0.9 x 0.7 cm. These are again most likely small accessory splenules, not previously FDG avid. Attention on follow-up. 3. Minimal emphysema, diffuse bilateral bronchial wall thickening, and background of very fine centrilobular nodules, most  concentrated in the lung apices, consistent with smoking-related respiratory bronchiolitis. Aortic Atherosclerosis (ICD10-I70.0) and Emphysema (ICD10-J43.9). Electronically Signed   By: Delanna Ahmadi M.D.   On: 04/07/2022 14:44

## 2022-04-09 NOTE — Assessment & Plan Note (Signed)
Continue current chemotherapy treatments. 

## 2022-04-09 NOTE — Telephone Encounter (Signed)
Scheduled per 09/01 scheduled message, patient has been called and notified. 

## 2022-04-09 NOTE — Progress Notes (Signed)
Patient here for oncology follow-up appointment, expresses no new concerns at this time.    

## 2022-04-09 NOTE — Assessment & Plan Note (Signed)
Lyrica does not help, she stopped.  I discussed with Dr.Vaslow, she will have a phone visit with neurology next week.

## 2022-04-09 NOTE — Assessment & Plan Note (Signed)
Avoid nephrotoxins  

## 2022-04-09 NOTE — Assessment & Plan Note (Signed)
+  pleural effusion cytology  -( somatic BRCA1 positive, HRD positive). S/p 4 cycles of neoadjuvant chemotherapy  Carboplatin /Taxol  S/p total hysterectomy with salpingo-oophorectomy and omentectomy. Optimal (R<1) interval tumor debulking, -followed by 3 cycles of adjuvant carboplatin/Taxol. Labs reviewed and discussed with patient.  CA125 is stable. Continue current regimen olaparib 300 mg twice daily. CT shows NED

## 2022-04-10 LAB — CA 125: Cancer Antigen (CA) 125: 3.6 U/mL (ref 0.0–38.1)

## 2022-04-16 ENCOUNTER — Inpatient Hospital Stay (HOSPITAL_BASED_OUTPATIENT_CLINIC_OR_DEPARTMENT_OTHER): Payer: Medicare Other | Admitting: Internal Medicine

## 2022-04-16 DIAGNOSIS — G620 Drug-induced polyneuropathy: Secondary | ICD-10-CM

## 2022-04-16 DIAGNOSIS — T451X5A Adverse effect of antineoplastic and immunosuppressive drugs, initial encounter: Secondary | ICD-10-CM | POA: Diagnosis not present

## 2022-04-16 MED ORDER — PREGABALIN 100 MG PO CAPS: 100.0000 mg | ORAL_CAPSULE | Freq: Two times a day (BID) | ORAL | 2 refills | Status: DC

## 2022-04-16 NOTE — Progress Notes (Signed)
I connected with Linda Schmitt on 04/16/22 at  2:00 PM EDT by telephone visit and verified that I am speaking with the correct person using two identifiers.  I discussed the limitations, risks, security and privacy concerns of performing an evaluation and management service by telemedicine and the availability of in-person appointments. I also discussed with the patient that there may be a patient responsible charge related to this service. The patient expressed understanding and agreed to proceed.  Other persons participating in the visit and their role in the encounter:  n/a  Patient's location:  Home Provider's location:  Office Chief Complaint:  Chemotherapy-induced neuropathy ([place])  History of Present Ilness: Linda Schmitt reports no improvement in neuropathy symptoms with Lyrica '50mg'$  twice per day.  Continues to have the same tingling in her feet which sometimes interrupts her sleep.  Continues on olaparib with Dr. Tasia Catchings.  Observations: Language and cognition at baseline  Assessment and Plan: Chemotherapy-induced neuropathy ([place])  Follow Up Instructions: Recommended increasing Lyrica to '100mg'$  BID if tolerated.  Already refractory to gabapentin and pamelor, can try elavil or cymbalta if needed at next visit.  I discussed the assessment and treatment plan with the patient.  The patient was provided an opportunity to ask questions and all were answered.  The patient agreed with the plan and demonstrated understanding of the instructions.    The patient was advised to call back or seek an in-person evaluation if the symptoms worsen or if the condition fails to improve as anticipated.    Ventura Sellers, MD   I provided 15 minutes of non face-to-face telephone visit time during this encounter, and > 50% was spent counseling as documented under my assessment & plan.

## 2022-05-07 DIAGNOSIS — K219 Gastro-esophageal reflux disease without esophagitis: Secondary | ICD-10-CM | POA: Diagnosis not present

## 2022-05-11 ENCOUNTER — Inpatient Hospital Stay: Payer: Medicare Other | Attending: Oncology

## 2022-05-11 ENCOUNTER — Encounter: Payer: Self-pay | Admitting: Oncology

## 2022-05-11 ENCOUNTER — Inpatient Hospital Stay (HOSPITAL_BASED_OUTPATIENT_CLINIC_OR_DEPARTMENT_OTHER): Payer: Medicare Other | Admitting: Oncology

## 2022-05-11 DIAGNOSIS — Z87891 Personal history of nicotine dependence: Secondary | ICD-10-CM | POA: Diagnosis not present

## 2022-05-11 DIAGNOSIS — G629 Polyneuropathy, unspecified: Secondary | ICD-10-CM | POA: Insufficient documentation

## 2022-05-11 DIAGNOSIS — N183 Chronic kidney disease, stage 3 unspecified: Secondary | ICD-10-CM | POA: Insufficient documentation

## 2022-05-11 DIAGNOSIS — Z79899 Other long term (current) drug therapy: Secondary | ICD-10-CM | POA: Diagnosis not present

## 2022-05-11 DIAGNOSIS — C763 Malignant neoplasm of pelvis: Secondary | ICD-10-CM

## 2022-05-11 DIAGNOSIS — C5700 Malignant neoplasm of unspecified fallopian tube: Secondary | ICD-10-CM

## 2022-05-11 DIAGNOSIS — J910 Malignant pleural effusion: Secondary | ICD-10-CM | POA: Diagnosis not present

## 2022-05-11 DIAGNOSIS — Z5111 Encounter for antineoplastic chemotherapy: Secondary | ICD-10-CM | POA: Diagnosis not present

## 2022-05-11 DIAGNOSIS — N1831 Chronic kidney disease, stage 3a: Secondary | ICD-10-CM | POA: Diagnosis not present

## 2022-05-11 LAB — COMPREHENSIVE METABOLIC PANEL
ALT: 14 U/L (ref 0–44)
AST: 19 U/L (ref 15–41)
Albumin: 3.9 g/dL (ref 3.5–5.0)
Alkaline Phosphatase: 45 U/L (ref 38–126)
Anion gap: 6 (ref 5–15)
BUN: 11 mg/dL (ref 8–23)
CO2: 27 mmol/L (ref 22–32)
Calcium: 9.2 mg/dL (ref 8.9–10.3)
Chloride: 105 mmol/L (ref 98–111)
Creatinine, Ser: 1.07 mg/dL — ABNORMAL HIGH (ref 0.44–1.00)
GFR, Estimated: 57 mL/min — ABNORMAL LOW (ref >60)
Glucose, Bld: 116 mg/dL — ABNORMAL HIGH (ref 70–99)
Potassium: 3.5 mmol/L (ref 3.5–5.1)
Sodium: 138 mmol/L (ref 135–145)
Total Bilirubin: 0.7 mg/dL (ref 0.3–1.2)
Total Protein: 6.9 g/dL (ref 6.5–8.1)

## 2022-05-11 LAB — CBC WITH DIFFERENTIAL/PLATELET
Abs Immature Granulocytes: 0.02 10*3/uL (ref 0.00–0.07)
Basophils Absolute: 0 10*3/uL (ref 0.0–0.1)
Basophils Relative: 1 %
Eosinophils Absolute: 0.1 10*3/uL (ref 0.0–0.5)
Eosinophils Relative: 1 %
HCT: 34.4 % — ABNORMAL LOW (ref 36.0–46.0)
Hemoglobin: 11.6 g/dL — ABNORMAL LOW (ref 12.0–15.0)
Immature Granulocytes: 1 %
Lymphocytes Relative: 25 %
Lymphs Abs: 1.1 10*3/uL (ref 0.7–4.0)
MCH: 35 pg — ABNORMAL HIGH (ref 26.0–34.0)
MCHC: 33.7 g/dL (ref 30.0–36.0)
MCV: 103.9 fL — ABNORMAL HIGH (ref 80.0–100.0)
Monocytes Absolute: 0.4 10*3/uL (ref 0.1–1.0)
Monocytes Relative: 9 %
Neutro Abs: 2.8 10*3/uL (ref 1.7–7.7)
Neutrophils Relative %: 63 %
Platelets: 169 10*3/uL (ref 150–400)
RBC: 3.31 MIL/uL — ABNORMAL LOW (ref 3.87–5.11)
RDW: 15.2 % (ref 11.5–15.5)
WBC: 4.4 10*3/uL (ref 4.0–10.5)
nRBC: 0 % (ref 0.0–0.2)

## 2022-05-11 MED ORDER — OLAPARIB 150 MG PO TABS: 300.0000 mg | ORAL_TABLET | Freq: Two times a day (BID) | ORAL | 2 refills | Status: DC

## 2022-05-11 NOTE — Progress Notes (Signed)
Hematology/Oncology Progress note [phone] [phone]     Patient Care Team: Burnard Hawthorne, FNP as PCP - General (Family Medicine) Clent Jacks, RN as Oncology Nurse Navigator Earlie Server, MD as Consulting Physician (Oncology) Mellody Drown, MD as Referring Physician (Gynecologic Oncology) Gillis Ends, MD as Referring Physician (Gynecologic Oncology) Virgel Manifold, MD (Inactive) as Consulting Physician (Gastroenterology)   Name of the patient: Linda Schmitt  [id number]  [birth year]   ASSESSMENT & PLAN:   Cancer Staging  Carcinoma of fallopian tube [facility]) Staging form: Ovary, Fallopian Tube, and Primary Peritoneal Carcinoma, AJCC 8th Edition - Pathologic stage from 02/18/2020: FIGO Stage IVA (pM1a) - Signed by Earlie Server, MD on 08/12/2020   Carcinoma of fallopian tube ([place]) + pleural effusion cytology  -( somatic BRCA1 positive, HRD positive). S/p 4 cycles of neoadjuvant chemotherapy  Carboplatin /Taxol  S/p total hysterectomy with salpingo-oophorectomy and omentectomy. Optimal (R<1) interval tumor debulking, -followed by 3 cycles of adjuvant carboplatin/Taxol. Labs reviewed and discussed with patient.  CA125 is stable. Continue current regimen olaparib 300 mg twice daily. CT shows NED continue surveillance   Encounter for antineoplastic chemotherapy Continue current chemotherapy treatments.  CKD (chronic kidney disease) stage 3, GFR 30-59 ml/min (HCC) Avoid nephrotoxins.   Neuropathy Lyrica does not help, she stopped.  Patient was seen by Dr.Vaslow who recommended patient to increase Lyrica to 100 mg twice daily.  Patient tried once and not able to tolerate.  I recommend patient to follow-up with neurology to see if there is any other additional device.      Orders Placed This Encounter  Procedures   CBC with Differential/Platelet     Standing Status:   Future    Standing Expiration Date:   05/12/2023   Comprehensive metabolic panel    Standing Status:   Future    Standing Expiration Date:   05/11/2023   CA 125    Standing Status:   Future    Standing Expiration Date:   05/11/2023   Follow up in 4-5 weeks  All questions were answered. The patient knows to call the clinic with any problems, questions or concerns.  Earlie Server, MD, PhD [facility] Hematology Oncology 05/11/2022    PERTINENT ONCOLOGY HISTORY Linda Schmitt is a 66 y.o.afemale who has above oncology history reviewed by me presents for follow up of carcinoma of fallopian tube   Oncology History  Carcinoma of fallopian tube ([place])  02/18/2020 Cancer Staging    -status post thoracentesis x2 during her recent admission. Cytology was positive for metastatic carcinoma.  TTF-1 Napsin A, GATA3, CDX2 were negative. Positive for PAX 8, which is most often expressing tumors of gynecological and renal origin Staging form: Ovary, Fallopian Tube, and Primary Peritoneal Carcinoma, AJCC 8th Edition - Pathologic stage from 02/18/2020: FIGO Stage IVA (pM1a) - Signed by Earlie Server, MD on 08/12/2020   02/25/2020 Imaging   PET scan initial staging 1. Exam shows evidence of peritoneal disease within the abdomen including FDG avid soft tissue infiltration into the omentum.  2. Large, loculated left pleural effusion with thin, FDG avid rind of soft tissue overlying the left lung. This corresponds to known, pathology proven malignant pleural effusion. 3. Small right pleural effusion with mild FDG uptake is equivocal for malignant effusion of the right hemithorax. 4. Fluid density structure within right side of pelvis abutting the right-side of the uterus is noted. This is indeterminate. Further investigation with pelvic sonogram may be helpful.   03/04/2020 -  05/06/2020 Chemotherapy   4 cycles of neoadjuvant carbo and Taxol   04/24/2020 Imaging   CT chest abdomen pelvis with  contrast showed 1. Moderate left malignant pleural effusion, decreased in size compared to the prior examination. 2. Omental caking highly concerning for intraperitoneal metastatic disease. 3. No definite primary malignancy confidently identified on today's examination. 4. Stable nodule in the lateral limb of the left adrenal gland dating back to at least 2017, presumably a benign lesions such as an adrenal adenoma.5. Aortic atherosclerosis.6. Additional incidental findings, as above.     05/21/2020 Imaging   CT chest with contrast 1. Small left pleural effusion similar to prior CT. Overall no significant interval change compared to the prior CT. 2. Stable left adrenal nodule. 3. Aortic Atherosclerosis (ICD10-I70.0).   06/05/2020 Procedure    status post Total hysterectomy and bilateral salpingo-oophorectomy, Omentectomy, and Optimal (R<1) interval tumor debulking. A. Peritoneal nodule, excisional biopsy: Fibroadipose tissue with calcifications, negative for tumor. B. Uterus, bilateral ovaries and fallopian tubes, hysterectomy and bilateral salpingo-oophorectomy: Cervix: Squamous metaplasia. Endometrium: Inactive endometrium. Myometrium: Adenomyosis. Serosa: High grade serous adenocarcinoma. Right ovary: High grade serous adenocarcinoma, involving the ovarian surface and parenchyma. Right fallopian tube: No specific pathologic change. Left ovary: High grade serous adenocarcinoma, involving the ovarian surface and parenchyma. Left fallopian tube: High grade serous adenocarcinoma, involving tubal fimbria. Serous tubal intraepithelial carcinoma. Immunohistochemistry was performed on block B14 at [facility] to characterize the pathologic process and demonstrates the following immunophenotype in the cells of interest:POSITIVE:  p53, p16 (strong, patchy), Ki67 (elevated) This staining profile supports the above diagnosis. C. Peritoneal nodule, left pericolic gutter, excisional biopsy: High  grade serous adenocarcinoma. D. Cul-de-sac, excisional biopsy: High grade serous adenocarcinoma. E. Omentum, excision: High grade serous adenocarcinoma (up to 3.0 cm).   TUMOR   Tumor Site:    Left fallopian tube   Histologic Type:    Serous carcinoma   Histologic Grade:    High grade   Tumor Size:    Greatest Dimension (Centimeters): 0.2 cm   Ovarian Surface Involvement:    Present     Laterality:    Bilateral   Fallopian Tube Surface Involvement:    Present     Laterality:    Left   Implants:    Present (sites): left peritoneum, cul-de-sac, omentum   Other Tissue / Organ Involvement:    Right ovary   Other Tissue / Organ Involvement:    Left ovary   Other Tissue / Organ Involvement:    Left fallopian tube   Other Tissue / Organ Involvement:    Pelvic peritoneum   Other Tissue / Organ Involvement:    Omentum   Largest Extrapelvic Peritoneal Focus:    Macroscopic (greater than 2 cm)   Peritoneal / Ascitic Fluid:    Not submitted / unknown   Pleural Fluid:    Not submitted / unknown   Treatment Effect:    Moderate response identified (CRS 2)   LYMPH NODES   Regional Lymph Nodes:    No lymph nodes submitted or found   PATHOLOGIC STAGE CLASSIFICATION (pTNM, AJCC 8th Edition)   TNM Descriptors:    y (post-treatment)   Primary Tumor (pT):    pT3c   Regional Lymph Nodes (pN):    pNX  FIGO Stage:    IIIC   + pleural effusion so STAGE IV No germline BRCA mutation, -Homologous recombination deficiency positive.  Myriad Genomic instability score:  positive, somatic BRCA1 positive   06/25/2020 Initial Diagnosis  Carcinoma of fallopian tube ([place])   07/01/2020 - 08/12/2020 Chemotherapy   3 cycles of adjuvant chemotherapy with carboplatin AUC 5, Taxol 175 mg/m.     09/02/2020 -  Chemotherapy   started on olaparib 300 mg twice daily.   11/16/2020 Imaging   CT chest abdomen pelvis with contrast showed 1. Status post interval hysterectomy and bilateral salpingectomy. 2. Status post  interval omentectomy. There is no residual suspicious peritoneal nodularity identified. Unchanged small nodules adjacent to the spleen which are stable compared to examinations dating back to 2017 and likely small splenules and/or lymph nodes. Attention on follow-up 3. Interval resolution of a previously seen moderate left pleural effusion. There is minimal residual pleural thickening. 4. Findings are consistent with treatment response.5. No evidence of new metastatic disease in the chest, abdomen, or pelvis.   Aortic Atherosclerosis (ICD10-I70.0).   03/18/2021 Imaging   CT chest abdomen pelvis with contrast showed Chest Impression: 1. No evidence metastatic disease in the thorax. 2. Stable borderline enlarged mediastinal lymph nodes and RIGHT hilar node. Recommend attention on follow-up. Abdomen / Pelvis Impression: 1. No evidence of peritoneal or omental nodal metastatic recurrence. 2. No evidence of local recurrence in the pelvis. 3. No free fluid in the abdomen pelvis.    07/14/2021 Imaging   CT chest abdomen pelvis with contrast showed 1. No evidence of new or progressive disease within the chest,abdomen, or pelvis. 2. Stable borderline enlarged mediastinal and right hilar lymph nodes, nonspecific and possibly reactive. Recommend attention on follow-up imaging. 3. Moderate volume of formed stool throughout the colon suggestive of constipation. 4. Stable 1.4 cm left adrenal nodule previously characterized as an adenoma on PET-CT February 25, 2020. 5.  Aortic Atherosclerosis (ICD10-I70.0).   11/24/2021 Imaging   CT chest abdomen pelvis with contrast 1. No new or progressive findings in the chest, abdomen or pelvis.2. Scattered small perisplenic nodules are unchanged potentially small splenules. 3. Stable LEFT adrenal lesion, unchanged dating back to October of 2017. This likely represents an adenoma. 4. Post hysterectomy.5. Aortic atherosclerosis.   04/07/2022 Imaging   CT chest abdomen  pelvis without contrast 1. Status post hysterectomy, oophorectomy, and omentectomy. No noncontrast evidence of recurrent or metastatic disease in the chest, abdomen, or pelvis. 2. Multiple small nodules adjacent to the spleen in the left upper quadrant, measuring up to 0.9 x 0.7 cm. These are again most likely small accessory splenules, not previously FDG avid. Attention on follow-up. 3. Minimal emphysema, diffuse bilateral bronchial wall thickening, and background of very fine centrilobular nodules, most concentrated in the lung apices, consistent with smoking-related respiratory bronchiolitis.  Aortic Atherosclerosis (ICD10-I70.0) and Emphysema (ICD10-J43.9).      INTERVAL HISTORY 66 year old female presents for follow-up of stage IV ovarian cancer,  somatic BRCA1 positive and HRD positive.  Patient has been on olaparib since January 2022.   Overall patient tolerates well denies any nausea vomiting diarrhea.  She feels well.  No new complaints.   Review of Systems  Constitutional:  Negative for appetite change, chills, fatigue and fever.  HENT:   Negative for hearing loss and voice change.   Eyes:  Negative for eye problems.  Respiratory:  Negative for chest tightness and cough.   Cardiovascular:  Negative for chest pain.  Gastrointestinal:  Negative for abdominal distention, abdominal pain and blood in stool.  Endocrine: Negative for hot flashes.  Genitourinary:  Negative for difficulty urinating and frequency.   Musculoskeletal:  Negative for arthralgias.  Skin:  Negative for itching and rash.  Neurological:  Positive for numbness. Negative for extremity weakness.  Hematological:  Negative for adenopathy.  Psychiatric/Behavioral:  Positive for sleep disturbance. Negative for confusion.         No Known Allergies   Past Medical History:  Diagnosis Date   Cancer ([place])    ovarian cancer   Family history of breast cancer    Genetic testing 04/09/2020   No pathogenic variants  identified on the Myriad MyRisk (germline) panel. The report date is 04/07/2020.   The [facility] gene panel offered by [employer] includes sequencing and deletion/duplication testing of the following 35 genes: APC, ATM, AXIN2, BARD1, BMPR1A, BRCA1, BRCA2, BRIP1, CHD1, CDK4, CDKN2A, CHEK2, EPCAM (large rearrangement only), HOXB13, GALNT12, MLH1, MSH2, MSH3, MSH6   HLD (hyperlipidemia)      Past Surgical History:  Procedure Laterality Date   CARPAL TUNNEL RELEASE Left    COLONOSCOPY WITH PROPOFOL N/A 12/10/2020   Procedure: COLONOSCOPY WITH PROPOFOL;  Surgeon: Virgel Manifold, MD;  Location: [facility] ENDOSCOPY;  Service: Endoscopy;  Laterality: N/A;   ROBOTIC ASSISTED TOTAL HYSTERECTOMY WITH BILATERAL SALPINGO OOPHERECTOMY  06/05/2020   with omentectomy tumor debulking via mini-laparotomy/hand assisted port   SHOULDER ARTHROSCOPY WITH SUBACROMIAL DECOMPRESSION AND OPEN ROTATOR C Right 02/01/2020   Procedure: RIGHT SHOULDER ARTHROSCOPIC SUBSCAPULARIS REPAIR, ROTATOR CUFF REPAIR, SUABCROMIAL DECOMPRESSION, AND BICEPS TENODESIS.;  Surgeon: Leim Fabry, MD;  Location: [facility] ORS;  Service: Orthopedics;  Laterality: Right;   TUBAL LIGATION      Social History   Socioeconomic History   Marital status: Single    Spouse name: Not on file   Number of children: Not on file   Years of education: Not on file   Highest education level: Not on file  Occupational History   Not on file  Tobacco Use   Smoking status: Former    Packs/day: 0.75    Years: 40.00    Total pack years: 30.00    Types: Cigarettes    Quit date: 02/06/2018    Years since quitting: 4.2   Smokeless tobacco: Never   Tobacco comments:    quit 02/2018  Vaping Use   Vaping Use: Never used  Substance and Sexual Activity   Alcohol use: Yes    Comment: 2-3 glasses of wine per week,none last 24hrs   Drug use: No   Sexual activity: Not on file  Other Topics Concern   Not on file  Social History Narrative   Lives in  [place] alone. Work - [employer]   Diet: Regular   Exercise: walking   Social Determinants of Radio broadcast assistant Strain: Low Risk  (11/27/2021)   Overall Financial Resource Strain (CARDIA)    Difficulty of Paying Living Expenses: Not hard at all  Food Insecurity: No Food Insecurity (11/27/2021)   Hunger Vital Sign    Worried About Running Out of Food in the Last Year: Never true    Ran Out of Food in the Last Year: Never true  Transportation Needs: No Transportation Needs (11/27/2021)   PRAPARE - Hydrologist (Medical): No    Lack of Transportation (Non-Medical): No  Physical Activity: Not on file  Stress: No Stress Concern Present (11/27/2021)   [place]    Feeling of Stress : Not at all  Social Connections: Unknown (11/27/2021)   Social Connection and Isolation Panel [NHANES]    Frequency of Communication with Friends and Family: More than three times a week  Frequency of Social Gatherings with Friends and Family: More than three times a week    Attends Religious Services: Not on file    Active Member of Clubs or Organizations: Not on file    Attends Archivist Meetings: Not on file    Marital Status: Not on file  Intimate Partner Violence: Not At Risk (11/27/2021)   Humiliation, Afraid, Rape, and Kick questionnaire    Fear of Current or Ex-Partner: No    Emotionally Abused: No    Physically Abused: No    Sexually Abused: No    Family History  Problem Relation Age of Onset   Cancer Mother        lymphoma   Stroke Sister    Cancer Brother        lung   Cancer Sister        lung   Lung cancer Brother    Bone cancer Sister    Breast cancer Cousin 35       maternal   Cancer Maternal Aunt        unk types     Current Outpatient Medications:    cholecalciferol (VITAMIN D3) 10 MCG (400 UNIT) TABS tablet, Take 800 Units by mouth daily., Disp: , Rfl:     cyanocobalamin 1000 MCG tablet, Take 400 mcg by mouth daily., Disp: , Rfl:    pravastatin (PRAVACHOL) 40 MG tablet, TAKE 1 TABLET BY MOUTH EVERY DAY IN THE EVENING, Disp: 90 tablet, Rfl: 3   pregabalin (LYRICA) 100 MG capsule, Take 1 capsule (100 mg total) by mouth 2 (two) times daily., Disp: 60 capsule, Rfl: 2   olaparib (LYNPARZA) 150 MG tablet, Take 2 tablets (300 mg total) by mouth 2 (two) times daily. May take with food to decrease nausea and vomiting., Disp: 120 tablet, Rfl: 2  Physical exam:  Vitals:   05/11/22 1038  BP: 130/77  Pulse: 70  Resp: 18  SpO2: 100%  Weight: 217 lb (98.4 kg)  Height: 5' 4.25" (1.632 m)   Physical Exam Constitutional:      General: She is not in acute distress.    Appearance: She is not diaphoretic.  HENT:     Head: Normocephalic and atraumatic.     Nose: Nose normal.     Mouth/Throat:     Pharynx: No oropharyngeal exudate.  Eyes:     General: No scleral icterus.    Pupils: Pupils are equal, round, and reactive to light.  Cardiovascular:     Rate and Rhythm: Normal rate and regular rhythm.     Heart sounds: No murmur heard. Pulmonary:     Effort: Pulmonary effort is normal. No respiratory distress.     Breath sounds: No rales.  Chest:     Chest wall: No tenderness.  Abdominal:     General: There is no distension.     Palpations: Abdomen is soft.     Tenderness: There is no abdominal tenderness.  Musculoskeletal:        General: Normal range of motion.     Cervical back: Normal range of motion and neck supple.  Skin:    General: Skin is warm and dry.     Findings: No erythema.  Neurological:     Mental Status: She is alert and oriented to person, place, and time.     Cranial Nerves: No cranial nerve deficit.     Motor: No abnormal muscle tone.     Coordination: Coordination normal.  Psychiatric:  Mood and Affect: Affect normal.     Labs are reviewed and discussed with patient.    Latest Ref Rng & Units 05/11/2022   10:13  AM 04/09/2022   11:28 AM 03/08/2022    9:41 AM  CBC  WBC 4.0 - 10.5 K/uL 4.4  4.3  4.0   Hemoglobin 12.0 - 15.0 g/dL 11.6  11.8  11.3   Hematocrit 36.0 - 46.0 % 34.4  35.4  34.1   Platelets 150 - 400 K/uL 169  175  128       Latest Ref Rng & Units 05/11/2022   10:13 AM 04/09/2022   11:28 AM 03/08/2022    9:41 AM  CMP  Glucose 70 - 99 mg/dL 116  108  114   BUN 8 - 23 mg/dL 11  12  14    Creatinine 0.44 - 1.00 mg/dL 1.07  0.93  1.10   Sodium 135 - 145 mmol/L 138  137  140   Potassium 3.5 - 5.1 mmol/L 3.5  3.5  3.7   Chloride 98 - 111 mmol/L 105  104  108   CO2 22 - 32 mmol/L 27  24  25    Calcium 8.9 - 10.3 mg/dL 9.2  9.1  8.9   Total Protein 6.5 - 8.1 g/dL 6.9  7.5  6.7   Total Bilirubin 0.3 - 1.2 mg/dL 0.7  0.7  0.4   Alkaline Phos 38 - 126 U/L 45  46  42   AST 15 - 41 U/L 19  19  18    ALT 0 - 44 U/L 14  15  11     RADIOGRAPHIC STUDIES: I have personally reviewed the radiological images as listed and agreed with the findings in the report. CT CHEST ABDOMEN PELVIS WO CONTRAST  Result Date: 04/07/2022 CLINICAL DATA:  Restaging fallopian tube cancer, status post hysterectomy, oophorectomy, salpingectomy, omentectomy, and debulking * Tracking Code: BO * EXAM: CT CHEST, ABDOMEN AND PELVIS WITHOUT CONTRAST TECHNIQUE: Multidetector CT imaging of the chest, abdomen and pelvis was performed following the standard protocol without IV contrast. Oral enteric contrast was administered RADIATION DOSE REDUCTION: This exam was performed according to the departmental dose-optimization program which includes automated exposure control, adjustment of the mA and/or kV according to patient size and/or use of iterative reconstruction technique. COMPARISON:  11/23/2021 FINDINGS: CT CHEST FINDINGS Cardiovascular: Aortic atherosclerosis. Normal heart size. No pericardial effusion. Mediastinum/Nodes: No enlarged mediastinal, hilar, or axillary lymph nodes. Unchanged small anterior mediastinal lymph node or thymic  nodule measuring 0.6 cm (series 2, image 18). Thyroid gland, trachea, and esophagus demonstrate no significant findings. Lungs/Pleura: Minimal centrilobular and paraseptal emphysema. Background of very fine centrilobular nodules, most concentrated in the lung apices. Diffuse bilateral bronchial wall thickening. No pleural effusion or pneumothorax. Musculoskeletal: No chest wall abnormality. No acute osseous findings. CT ABDOMEN PELVIS FINDINGS Hepatobiliary: No solid liver abnormality is seen. No gallstones, gallbladder wall thickening, or biliary dilatation. Pancreas: Unremarkable. No pancreatic ductal dilatation or surrounding inflammatory changes. Spleen: Normal in size without significant abnormality. Multiple small nodules adjacent to the spleen in the left upper quadrant, measuring up to 0.9 x 0.7 cm (series 2, image 57). Adrenals/Urinary Tract: Unchanged, benign adenoma of the lateral limb of the left adrenal gland measuring 1.6 x 1.5 cm (series 2, image 54). Normal right adrenal gland. Kidneys are normal, without renal calculi, solid lesion, or hydronephrosis. Bladder is unremarkable. Stomach/Bowel: Stomach is within normal limits. Appendix appears normal. No evidence of bowel wall thickening, distention, or inflammatory  changes. Status post omentectomy. Vascular/Lymphatic: Aortic atherosclerosis. No enlarged abdominal or pelvic lymph nodes. Reproductive: Status post hysterectomy and oophorectomy. Other: No abdominal wall hernia or abnormality. No ascites. Musculoskeletal: No acute osseous findings. IMPRESSION: 1. Status post hysterectomy, oophorectomy, and omentectomy. No noncontrast evidence of recurrent or metastatic disease in the chest, abdomen, or pelvis. 2. Multiple small nodules adjacent to the spleen in the left upper quadrant, measuring up to 0.9 x 0.7 cm. These are again most likely small accessory splenules, not previously FDG avid. Attention on follow-up. 3. Minimal emphysema, diffuse bilateral  bronchial wall thickening, and background of very fine centrilobular nodules, most concentrated in the lung apices, consistent with smoking-related respiratory bronchiolitis. Aortic Atherosclerosis (ICD10-I70.0) and Emphysema (ICD10-J43.9). Electronically Signed   By: Delanna Ahmadi M.D.   On: 04/07/2022 14:44

## 2022-05-11 NOTE — Assessment & Plan Note (Signed)
Continue current chemotherapy treatments. 

## 2022-05-11 NOTE — Assessment & Plan Note (Signed)
+  pleural effusion cytology  -( somatic BRCA1 positive, HRD positive). S/p 4 cycles of neoadjuvant chemotherapy  Carboplatin /Taxol  S/p total hysterectomy with salpingo-oophorectomy and omentectomy. Optimal (R<1) interval tumor debulking, -followed by 3 cycles of adjuvant carboplatin/Taxol. Labs reviewed and discussed with patient.  CA125 is stable. Continue current regimen olaparib 300 mg twice daily. CT shows NED continue surveillance

## 2022-05-11 NOTE — Assessment & Plan Note (Signed)
Avoid nephrotoxins  

## 2022-05-11 NOTE — Assessment & Plan Note (Addendum)
Lyrica does not help, she stopped.  Patient was seen by Dr.Vaslow who recommended patient to increase Lyrica to 100 mg twice daily.  Patient tried once and not able to tolerate.  I recommend patient to follow-up with neurology to see if there is any other additional device.

## 2022-05-13 LAB — CA 125: Cancer Antigen (CA) 125: 3.6 U/mL (ref 0.0–38.1)

## 2022-05-14 ENCOUNTER — Ambulatory Visit: Payer: Medicare Other | Admitting: Oncology

## 2022-05-14 ENCOUNTER — Other Ambulatory Visit: Payer: Medicare Other

## 2022-06-09 ENCOUNTER — Encounter: Payer: Self-pay | Admitting: Obstetrics and Gynecology

## 2022-06-09 ENCOUNTER — Inpatient Hospital Stay: Payer: Medicare Other | Attending: Oncology | Admitting: Obstetrics and Gynecology

## 2022-06-09 VITALS — BP 119/72 | HR 70 | Temp 97.8°F | Resp 19 | Wt 221.2 lb

## 2022-06-09 DIAGNOSIS — C5700 Malignant neoplasm of unspecified fallopian tube: Secondary | ICD-10-CM

## 2022-06-09 DIAGNOSIS — Z8544 Personal history of malignant neoplasm of other female genital organs: Secondary | ICD-10-CM | POA: Diagnosis not present

## 2022-06-09 DIAGNOSIS — Z79899 Other long term (current) drug therapy: Secondary | ICD-10-CM | POA: Diagnosis not present

## 2022-06-09 DIAGNOSIS — Z90722 Acquired absence of ovaries, bilateral: Secondary | ICD-10-CM | POA: Insufficient documentation

## 2022-06-09 DIAGNOSIS — Z1509 Genetic susceptibility to other malignant neoplasm: Secondary | ICD-10-CM | POA: Diagnosis not present

## 2022-06-09 DIAGNOSIS — Z148 Genetic carrier of other disease: Secondary | ICD-10-CM | POA: Diagnosis not present

## 2022-06-09 DIAGNOSIS — Z1501 Genetic susceptibility to malignant neoplasm of breast: Secondary | ICD-10-CM | POA: Insufficient documentation

## 2022-06-09 DIAGNOSIS — Z1502 Genetic susceptibility to malignant neoplasm of ovary: Secondary | ICD-10-CM | POA: Diagnosis not present

## 2022-06-09 DIAGNOSIS — Z9221 Personal history of antineoplastic chemotherapy: Secondary | ICD-10-CM | POA: Insufficient documentation

## 2022-06-09 DIAGNOSIS — Z90710 Acquired absence of both cervix and uterus: Secondary | ICD-10-CM | POA: Insufficient documentation

## 2022-06-09 DIAGNOSIS — C763 Malignant neoplasm of pelvis: Secondary | ICD-10-CM

## 2022-06-09 NOTE — Progress Notes (Signed)
Gynecologic Oncology Interval Visit  [facility]  [phone] [phone]  Patient Care Team: Burnard Hawthorne, FNP as PCP - General (Family Medicine) Clent Jacks, RN as Oncology Nurse Navigator Earlie Server, MD as Consulting Physician (Oncology) Mellody Drown, MD as Referring Physician (Gynecologic Oncology) Gillis Ends, MD as Referring Physician (Gynecologic Oncology) Virgel Manifold, MD (Inactive) as Consulting Physician (Gastroenterology)   Name of the patient: Linda Schmitt  [id number]  03/06/1956   Date of visit: 06/09/2022  Chief Complaint: 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 Tasia Catchings  Subjective:  Linda Schmitt is a 66 y.o. G1P1 female, initially seen in consultation from Dr. 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   Follow up CT 04/07/22 for restaging with Dr Tasia Catchings. No evidence of recurrent or metastatic disease. Multiple adjacent nodules to spleen, thought to be small accessory splenules, not previously FDG avid. Minimal emphysema. Aortic atherosclerosis and emphysema.   Continues to tolerate olaparib well without significant side effects. CA 125 has been followed and remains low. She feels well and denies complaints.   0 Result Notes           Component Ref Range & Units 4 wk ago 2 mo ago 3 mo ago 4 mo ago 5 mo ago 6 mo ago 7 mo ago  Cancer Antigen (CA) 125 0.0 - 38.1 U/mL 3.6  3.6 CM  4.1 CM  4.8 CM  4.2 CM  4.5 CM  5.2 CM        Gynecologic Oncology History:  Linda Schmitt is a 66 y.o. female, initially seen in consultation from Dr. 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 Her history is  as follows:  She presented with malignant pleural effusion two weeks after elective right shoulder surgery. She underwent thoracentesis on 02/18/2020 and 02/19/2020 with 1.8L and 1.2L removed respectively. Cytology was positive for PAX 8. PET scan showed evidence of peritoneal disease within the abdomen, soft tissue infiltrating into the omentum, large loculated left pleural effusion with thin FDG avid rind of soft tissue overlying the left lung. Small right pleural effusion with mild FDG uptake is equivocal for malignant effusion of the right hemithorax. Fluid density structure within right side of pelvis abutting the right sided uterus noted though indeterminate. Pelvic u/s obtained showed heterogeneous myometrium with poor definition of endometrial complex. Normal left ovary.  No cystic right adnexal lesion is identified.  No a probable small normal-appearing right ovary is identified.  Suspect that the low-attenuation presumed cystic structure seen in the right adnexa on the prior image corresponds to a small amount of nonspecific free fluid in the right adnexa.  Biopsy of omental mass on 03/05/20  DIAGNOSIS:  A. OMENTAL MASS; CT-GUIDED BIOPSY:  - HIGH-GRADE SEROUS CARCINOMA.   Given that cytology from left pleural effusion is positive for metastatic carcinoma, tumor cells are positive for PAX 8 which indicates GYN origin.  Given that patient has moderate to large amount of pleural effusion, heavy tumor burden, recommend to start chemotherapy as soon as possible with carboplatin AUC 5-6 and paclitaxel 175 mg/m every 3 weeks for 3 cycles.  Patient will establish care with gynecology oncology for evaluation of participating in clinical trials after neoadjuvant chemo/surgery. CA 125  was 53.6 at time of diagnosis.   Treatment Summary: 03/04/20 Cycle 1 carboplatin-paclitaxel CA 125 53.6 03/25/20 Cycle 2 carboplatin-paclitaxel CA 125 39.9 04/15/20  Cycle 3 carboplatin-paclitaxel CA 125 10.5  04/25/20 CT C/A/P  revealed moderate left malignant pleural effusion with some pleural thickening and enhancement which was decreased in size compared to prior exam. Omental caking apparent concerning for intraperitoneal metastatic disease. No definite primary malignancy identified. Stable left adrenal nodule. Aortic atherosclerosis.   05/06/2020 Cycle 4 carboplatin-paclitaxel CA125 5.8  On 06/05/2020 she underwent exam under anesthesia, Diagnostic laparoscopy, Robot-assisted total laparoscopic hysterectomy, Bilateral salpingo-oophorectomy, Omentectomy via mini-laparotomy/hand assisted port, and Optimal (R<1) interval tumor debulking. Pathology c/w tubal origin.   Pathology: High grade serous adenocarcinoma involving bilateral ovaries and fallopian tubes; peritoneal nodules, cul-de-sac and omentum (primary site left tube). p53, p16 (strong, patchy), Ki67 (elevated)  11/22/2021Cycle #5 paclitaxel and carboplatin CA125 4.6 07/22/2020 Cycle #6 paclitaxel and carboplatin  CA125 4.16 August 2020 olaparib maintenance initiated  10/29/2020 Patient complaining of pelvic pain/pressure as well as constipation at [facility] visit today. CTA ordered and completed on 11/14/2020. Included below.  IMPRESSION: 1. Status post interval hysterectomy and bilateral salpingectomy. 2. Status post interval omentectomy. There is no residual suspicious peritoneal nodularity identified. Unchanged small nodules adjacent to the spleen which are stable compared to examinations dating back to 2017 and likely small splenules and/or lymph nodes. Attention on follow-up  3. Interval resolution of a previously seen moderate left pleural effusion. There is minimal residual pleural thickening.  4. Findings are consistent with treatment response. 5. No evidence of new metastatic disease in the chest, abdomen, or pelvis.  03/18/2021, CT showed no obvious cancer metastasis or recurrence. There are borderline enlarged mediastinal lymph node and a right  hilar node.  Attention on follow-up   07/14/2021 CT chest abdomen pelvis with contrast showed no evidence of recurrent or metastatic disease.  Borderline enlarged mediastinal lymph node and hilar lymphadenopathy, attention on follow-up.  Stable  CA 125 has been followed since diagnosis 02/26/2020  53.6 (high)  03/25/2020  39.9  04/15/2020  10.5 (first normal) 05/06/2020  5.8  06/30/2020  4.6 07/22/2020  4.8 08/12/2020  4.3 09/02/2020  4.3 09/23/2020  4.7  10/21/2020  6.1 11/18/2020  6.9 12/16/2020  4.7 01/20/2021  3.4 02/16/2021  4.3 03/16/2021  4.9 05/04/2021  3.9 06/01/2021  3.8  07/13/2021  4.1 08/31/2021  4.7 10/13/2021  5.2 05/11/22 3.6  GENETIC TEST RESULTS:   Germline Testing: 04/07/20- negative. No clinically significant mutation identified. Breast Cancer Riskscore remaining life time risk- 7%.   Somatic Testing: Genetic testing reported out on 04/08/2020 through the [facility] HRD. No pathogenic variants identified on the [facility] (germline) panel. Pathogenic variant identified through [employer] (HRD testing) in BRCA1 called [id number], HRD positive.    The [facility] gene panel offered by [employer] includes sequencing and deletion/duplication testing of the following 35 genes: APC, ATM, AXIN2, BARD1, BMPR1A, BRCA1, BRCA2, BRIP1, CHD1, CDK4, CDKN2A, CHEK2, EPCAM (large rearrangement only), HOXB13, GALNT12, MLH1, MSH2, MSH3, MSH6, MUTYH, NBN, NTHL1, PALB2, PMS2, PTEN, RAD51C, RAD51D, RNF43, RPS20, SMAD4, STK11, and TP53. Sequencing was performed for select regions of POLE and POLD1, and large rearrangement analysis was performed for select regions of GREM1.   Health Maintenance Last Screening Mammogram 11/09/2020. BI-RADS CATEGORY  1: Negative. Repeat screening mammogram in 1 year.   Last Colonoscopy completed 12/10/2020.   Problem List: Patient Active Problem List   Diagnosis Date Noted   Neuropathy 04/09/2022  CKD (chronic kidney  disease) stage 3, GFR 30-59 ml/min (HCC) 03/08/2022   Hypokalemia 01/28/2022   Osteopenia 11/18/2021   Screen for colon cancer    Polyp of colon    Normocytic anemia 06/30/2020   Carcinoma of fallopian tube ([place]) 06/25/2020   BRCA1 gene mutation positive 04/11/2020   Genetic testing 04/09/2020   Serous carcinoma of female pelvis ([place]) 03/20/2020   Family history of breast cancer    Encounter for antineoplastic chemotherapy 03/04/2020   Chemotherapy-induced neuropathy ([place]) 03/04/2020   Mass of omentum 02/28/2020   Goals of care, counseling/discussion 02/26/2020   Metastatic carcinoma ([place]) 02/26/2020   Status post thoracentesis    Malignant pleural effusion    Pleural effusion 02/17/2020   Mass of arm, right 01/22/2020   Elevated blood pressure reading 12/28/2019   Hand tingling 12/28/2019   Acute pain of right shoulder 12/01/2018   Adrenal adenoma, left 11/11/2017   Aortic atherosclerosis ([place]) 09/23/2017   Prediabetes 07/09/2017   Ganglion of right wrist 07/06/2017   Hyperlipidemia 10/11/2016   Former smoker 09/21/2016   Depression, recurrent ([place]) 09/14/2016   Headache 09/14/2016   Routine general medical examination at a health care facility 01/14/2015   Screening for breast cancer 11/12/2013    Past Medical History: Past Medical History:  Diagnosis Date   Cancer ([place])    ovarian cancer   Family history of breast cancer    Genetic testing 04/09/2020   No pathogenic variants identified on the [employer] [facility] (germline) panel. The report date is 04/07/2020.   The [facility] gene panel offered by [employer] includes sequencing and deletion/duplication testing of the following 35 genes: APC, ATM, AXIN2, BARD1, BMPR1A, BRCA1, BRCA2, BRIP1, CHD1, CDK4, CDKN2A, CHEK2, EPCAM (large rearrangement only), HOXB13, GALNT12, MLH1, MSH2, MSH3, MSH6   HLD (hyperlipidemia)     Past Surgical History: Past Surgical History:  Procedure Laterality Date   CARPAL TUNNEL RELEASE  Left    COLONOSCOPY WITH PROPOFOL N/A 12/10/2020   Procedure: COLONOSCOPY WITH PROPOFOL;  Surgeon: Virgel Manifold, MD;  Location: [facility] ENDOSCOPY;  Service: Endoscopy;  Laterality: N/A;   ROBOTIC ASSISTED TOTAL HYSTERECTOMY WITH BILATERAL SALPINGO OOPHERECTOMY  06/05/2020   with omentectomy tumor debulking via mini-laparotomy/hand assisted port   SHOULDER ARTHROSCOPY WITH SUBACROMIAL DECOMPRESSION AND OPEN ROTATOR C Right 02/01/2020   Procedure: RIGHT SHOULDER ARTHROSCOPIC SUBSCAPULARIS REPAIR, ROTATOR CUFF REPAIR, SUABCROMIAL DECOMPRESSION, AND BICEPS TENODESIS.;  Surgeon: Leim Fabry, MD;  Location: [facility] ORS;  Service: Orthopedics;  Laterality: Right;   TUBAL LIGATION      Past Gynecologic History: as per HPI   OB History:  OB History  Gravida Para Term Preterm AB Living  _0 SAB IAB Ectopic Multiple Live Births               # Outcome Date GA Lbr Len/2nd Weight Sex Delivery Anes PTL Lv  1 Para             Family History: Family History  Problem Relation Age of Onset   Cancer Mother        lymphoma   Stroke Sister    Cancer Brother        lung   Cancer Sister        lung   Lung cancer Brother    Bone cancer Sister    Breast cancer Cousin 78       maternal   Cancer Maternal Aunt  unk types  Family history significant for lung cancer, lymphoma, and breast cancer.   Social History: Social History   Socioeconomic History   Marital status: Single    Spouse name: Not on file   Number of children: Not on file   Years of education: Not on file   Highest education level: Not on file  Occupational History   Not on file  Tobacco Use   Smoking status: Former    Packs/day: 0.75    Years: 40.00    Total pack years: 30.00    Types: Cigarettes    Quit date: 02/06/2018    Years since quitting: 4.3   Smokeless tobacco: Never   Tobacco comments:    quit 02/2018  Vaping Use   Vaping Use: Never used  Substance and Sexual Activity   Alcohol use: Yes     Comment: 2-3 glasses of wine per week,none last 24hrs   Drug use: No   Sexual activity: Not on file  Other Topics Concern   Not on file  Social History Narrative   Lives in [place] alone. Work - [employer]   Diet: Regular   Exercise: walking   Social Determinants of Radio broadcast assistant Strain: Low Risk  (11/27/2021)   Overall Financial Resource Strain (CARDIA)    Difficulty of Paying Living Expenses: Not hard at all  Food Insecurity: No Food Insecurity (11/27/2021)   Hunger Vital Sign    Worried About Running Out of Food in the Last Year: Never true    Ran Out of Food in the Last Year: Never true  Transportation Needs: No Transportation Needs (11/27/2021)   PRAPARE - Hydrologist (Medical): No    Lack of Transportation (Non-Medical): No  Physical Activity: Not on file  Stress: No Stress Concern Present (11/27/2021)   [place]    Feeling of Stress : Not at all  Social Connections: Unknown (11/27/2021)   Social Connection and Isolation Panel [NHANES]    Frequency of Communication with Friends and Family: More than three times a week    Frequency of Social Gatherings with Friends and Family: More than three times a week    Attends Religious Services: Not on file    Active Member of [place] or Organizations: Not on file    Attends Archivist Meetings: Not on file    Marital Status: Not on file  Intimate Partner Violence: Not At Risk (11/27/2021)   Humiliation, Afraid, Rape, and Kick questionnaire    Fear of Current or Ex-Partner: No    Emotionally Abused: No    Physically Abused: No    Sexually Abused: No   Immunization History  Administered Date(s) Administered   PFIZER Comirnaty(Gray Top)Covid-19 Tri-Sucrose Vaccine 05/08/2020   PFIZER(Purple Top)SARS-COV-2 Vaccination 05/08/2020, 05/29/2020   Tdap 12/24/2011   Allergies: No Known Allergies  Current  Medications: Current Outpatient Medications  Medication Sig Dispense Refill   cholecalciferol (VITAMIN D3) 10 MCG (400 UNIT) TABS tablet Take 800 Units by mouth daily.     cyanocobalamin 1000 MCG tablet Take 400 mcg by mouth daily.     olaparib (LYNPARZA) 150 MG tablet Take 2 tablets (300 mg total) by mouth 2 (two) times daily. May take with food to decrease nausea and vomiting. 120 tablet 2   pravastatin (PRAVACHOL) 40 MG tablet TAKE 1 TABLET BY MOUTH EVERY DAY IN THE EVENING 90 tablet 3   pregabalin (LYRICA) 100 MG capsule  Take 1 capsule (100 mg total) by mouth 2 (two) times daily. 60 capsule 2   No current facility-administered medications for this visit.   Immunization History  Administered Date(s) Administered   PFIZER Comirnaty(Gray Top)Covid-19 Tri-Sucrose Vaccine 05/08/2020   PFIZER(Purple Top)SARS-COV-2 Vaccination 05/08/2020, 05/29/2020   Tdap 12/24/2011   Review of Systems General:  no complaints Skin: no complaints Eyes: no complaints HEENT: no complaints Breasts: no complaints Pulmonary: no complaints Cardiac: no complaints Gastrointestinal: no complaints Linda Schmitt: no complaints Ob/Gyn: no complaints Musculoskeletal: no complaints Hematology: no complaints Neurologic/Psych: no complaints    Objective:  Physical Examination:  BP 119/72   Pulse 70   Temp 97.8 F (36.6 C)   Resp 19   Wt 221 lb 3.2 oz (100.3 kg)   SpO2 98%   BMI 37.67 kg/m     ECOG Performance Status: 1 - Symptomatic but completely ambulatory  GENERAL: Patient is a well appearing female in no acute distress HEENT:  PERRL, neck supple with midline trachea. Thyroid without masses.  NODES:  No cervical, supraclavicular, axillary, or inguinal lymphadenopathy palpated.  LUNGS:  Clear to auscultation bilaterally.  No wheezes or rhonchi. HEART:  Regular rate and rhythm.  ABDOMEN:  Soft, nontender, nondistended.   Well healed surgical incisions. No hernias or masses.  MSK:  No focal  spinal tenderness to palpation.  EXTREMITIES:  No peripheral edema.   SKIN:  Clear with no obvious rashes or skin changes. NEURO:  Nonfocal. Well oriented.  Appropriate affect.  Pelvic: Exam chaperoned by RN  EGBUS: no lesions Cervix: surgically absent Vagina: no lesions, no bleeding Uterus: surgically absent BME: no palpable masses Rectovaginal: deferred   Lab Review As per HPI  Radiologic Imaging: No imaging on site today    Assessment:  Linda Schmitt is a 66 y.o. female diagnosed with stage IV high grade serous fallopian tube cancer based on malignant pleural effusion.  Underwent NACT with paclitaxel/carboplatin for 4 cycles and MIS optimal interval debulking on 06/05/2020.  Completed 3 cycles of adjuvant carboplatin-taxol chemotherapy on 08/12/20, currently on maintenance olaparib since January 2022 with Dr Tasia Catchings. NED on exam today.    BRCA1 somatic mutation; HRD+  Medical co-morbidities complicating care: Body mass index is 37.67 kg/m. Plan:   Problem List Items Addressed This Visit       Genitourinary   Carcinoma of fallopian tube ([place]) - Primary (Chronic)     Other   Serous carcinoma of female pelvis ([place])   Tolerating maintenance PARP inhibitor well without significant side effects. Continue per Dr. Tasia Catchings until disease progression or unacceptable toxicity. Continue follow up with Dr. Tasia Catchings as scheduled for monitoring of use of parp inhibitor.   RTC in 3 months for follow up in [place] clinic with MD (patient prefers female providers if possible).    Beckey Rutter, DNP, AGNP-C [place] at [facility] [phone] (clinic)  I personally had a face to face interaction and evaluated the patient jointly with the NP, Ms. Beckey Rutter.  I have reviewed her history and available records and have performed the key portions of the physical exam including HEENT, general, abdominal exam, pelvic exam with my findings confirming those documented above by the APP.  I have  discussed the case with the APP and the patient.  I agree with the above documentation, assessment and plan which was fully formulated by me.  Counseling was completed by me.   A total of 20 minutes were spent with the patient/family today; >50 % was  spent in education, counseling and coordination of care for h/o stage IV high grade serous fallopian tube cancer based on malignant pleural effusion currently on PARPi.    Camrynn Mcclintic Gaetana Michaelis, MD

## 2022-06-14 ENCOUNTER — Telehealth: Payer: Self-pay | Admitting: Oncology

## 2022-06-14 NOTE — Telephone Encounter (Signed)
LVM for pt with updated appt information

## 2022-06-17 ENCOUNTER — Inpatient Hospital Stay: Payer: Medicare Other | Admitting: Internal Medicine

## 2022-06-18 ENCOUNTER — Other Ambulatory Visit: Payer: Medicare Other

## 2022-06-18 ENCOUNTER — Ambulatory Visit: Payer: Medicare Other | Admitting: Oncology

## 2022-06-23 ENCOUNTER — Encounter: Payer: Self-pay | Admitting: Oncology

## 2022-06-23 ENCOUNTER — Inpatient Hospital Stay (HOSPITAL_BASED_OUTPATIENT_CLINIC_OR_DEPARTMENT_OTHER): Payer: Medicare Other | Admitting: Oncology

## 2022-06-23 ENCOUNTER — Inpatient Hospital Stay: Payer: Medicare Other

## 2022-06-23 VITALS — BP 127/72 | HR 70 | Temp 98.9°F

## 2022-06-23 DIAGNOSIS — G629 Polyneuropathy, unspecified: Secondary | ICD-10-CM

## 2022-06-23 DIAGNOSIS — E876 Hypokalemia: Secondary | ICD-10-CM | POA: Diagnosis not present

## 2022-06-23 DIAGNOSIS — Z5111 Encounter for antineoplastic chemotherapy: Secondary | ICD-10-CM

## 2022-06-23 DIAGNOSIS — C5700 Malignant neoplasm of unspecified fallopian tube: Secondary | ICD-10-CM

## 2022-06-23 DIAGNOSIS — Z8544 Personal history of malignant neoplasm of other female genital organs: Secondary | ICD-10-CM | POA: Diagnosis not present

## 2022-06-23 DIAGNOSIS — Z79899 Other long term (current) drug therapy: Secondary | ICD-10-CM | POA: Diagnosis not present

## 2022-06-23 DIAGNOSIS — Z9221 Personal history of antineoplastic chemotherapy: Secondary | ICD-10-CM | POA: Diagnosis not present

## 2022-06-23 LAB — COMPREHENSIVE METABOLIC PANEL
ALT: 14 U/L (ref 0–44)
AST: 21 U/L (ref 15–41)
Albumin: 4.1 g/dL (ref 3.5–5.0)
Alkaline Phosphatase: 47 U/L (ref 38–126)
Anion gap: 8 (ref 5–15)
BUN: 12 mg/dL (ref 8–23)
CO2: 23 mmol/L (ref 22–32)
Calcium: 9 mg/dL (ref 8.9–10.3)
Chloride: 105 mmol/L (ref 98–111)
Creatinine, Ser: 1.09 mg/dL — ABNORMAL HIGH (ref 0.44–1.00)
GFR, Estimated: 55 mL/min — ABNORMAL LOW (ref >60)
Glucose, Bld: 129 mg/dL — ABNORMAL HIGH (ref 70–99)
Potassium: 3.2 mmol/L — ABNORMAL LOW (ref 3.5–5.1)
Sodium: 136 mmol/L (ref 135–145)
Total Bilirubin: 0.5 mg/dL (ref 0.3–1.2)
Total Protein: 7.1 g/dL (ref 6.5–8.1)

## 2022-06-23 LAB — CBC WITH DIFFERENTIAL/PLATELET
Abs Immature Granulocytes: 0.01 10*3/uL (ref 0.00–0.07)
Basophils Absolute: 0 10*3/uL (ref 0.0–0.1)
Basophils Relative: 0 %
Eosinophils Absolute: 0.1 10*3/uL (ref 0.0–0.5)
Eosinophils Relative: 1 %
HCT: 35 % — ABNORMAL LOW (ref 36.0–46.0)
Hemoglobin: 11.6 g/dL — ABNORMAL LOW (ref 12.0–15.0)
Immature Granulocytes: 0 %
Lymphocytes Relative: 30 %
Lymphs Abs: 1.3 10*3/uL (ref 0.7–4.0)
MCH: 35.4 pg — ABNORMAL HIGH (ref 26.0–34.0)
MCHC: 33.1 g/dL (ref 30.0–36.0)
MCV: 106.7 fL — ABNORMAL HIGH (ref 80.0–100.0)
Monocytes Absolute: 0.4 10*3/uL (ref 0.1–1.0)
Monocytes Relative: 8 %
Neutro Abs: 2.7 10*3/uL (ref 1.7–7.7)
Neutrophils Relative %: 61 %
Platelets: 121 10*3/uL — ABNORMAL LOW (ref 150–400)
RBC: 3.28 MIL/uL — ABNORMAL LOW (ref 3.87–5.11)
RDW: 15.1 % (ref 11.5–15.5)
WBC: 4.4 10*3/uL (ref 4.0–10.5)
nRBC: 0 % (ref 0.0–0.2)

## 2022-06-23 NOTE — Progress Notes (Signed)
Hematology/Oncology Progress note [phone] [phone]     Patient Care Team: Burnard Hawthorne, FNP as PCP - General (Family Medicine) Clent Jacks, RN as Oncology Nurse Navigator Earlie Server, MD as Consulting Physician (Oncology) Mellody Drown, MD as Referring Physician (Gynecologic Oncology) Gillis Ends, MD as Referring Physician (Gynecologic Oncology) Virgel Manifold, MD (Inactive) as Consulting Physician (Gastroenterology)   Name of the patient: Linda Schmitt  [id number]  1956-01-29   ASSESSMENT & PLAN:   Cancer Staging  Carcinoma of fallopian tube [facility]) Staging form: Ovary, Fallopian Tube, and Primary Peritoneal Carcinoma, AJCC 8th Edition - Pathologic stage from 02/18/2020: FIGO Stage IVA (pM1a) - Signed by Earlie Server, MD on 08/12/2020   Carcinoma of fallopian tube ([place]) + pleural effusion cytology  -( somatic BRCA1 positive, HRD positive). S/p 4 cycles of neoadjuvant chemotherapy  Carboplatin /Taxol  S/p total hysterectomy with salpingo-oophorectomy and omentectomy. Optimal (R<1) interval tumor debulking, -followed by 3 cycles of adjuvant carboplatin/Taxol. Labs reviewed and discussed with patient.  CA125 is stable. Continue current regimen olaparib 300 mg twice daily. Check CT in Dec 2023   Encounter for antineoplastic chemotherapy Continue current chemotherapy treatments.  Hypokalemia Patient takes potassium rich food.  K 3.2 Recommend potassium 66mq daily. She reports having some supply at home and no need for Rx.   CKD (chronic kidney disease) stage 3, GFR 30-59 ml/min (HCC) Avoid nephrotoxins.   Neuropathy She can not tolerate Lyrica to 100 mg twice daily.  Patient missed her Telemedicine with neurology. Encourage patient to reschedule. She may try lower dose of Lyrica 574mBID  Patient declined influenza vaccination  Orders  Placed This Encounter  Procedures   CT CHEST ABDOMEN PELVIS WO CONTRAST    To be done in Dec. 2023    Standing Status:   Future    Standing Expiration Date:   06/24/2023    Order Specific Question:   Preferred imaging location?    Answer:   [facility]    Order Specific Question:   Is Oral Contrast requested for this exam?    Answer:   Yes, Per Radiology protocol    Order Specific Question:   Does the patient have a contrast media/X-ray dye allergy?    Answer:   No   CBC with Differential/Platelet    Standing Status:   Future    Standing Expiration Date:   06/23/2023   Comprehensive metabolic panel    Standing Status:   Future    Standing Expiration Date:   06/23/2023   CA 125    Standing Status:   Future    Standing Expiration Date:   06/23/2023   Follow up in 6 weeks  All questions were answered. The patient knows to call the clinic with any problems, questions or concerns.  ZhEarlie ServerMD, PhD [facility] 06/23/2022    PERTINENT ONCOLOGY HISTORY CaEBONE ALCIVARs a [id number].o.afemale who has above oncology history reviewed by me presents for follow up of carcinoma of fallopian tube   Oncology History  Carcinoma of fallopian tube ([facility] 02/18/2020 Cancer Staging    -status post thoracentesis x2 during her recent admission. Cytology was positive for metastatic carcinoma.  TTF-1 Napsin A, GATA3, CDX2 were negative. Positive for PAX 8, which is most often expressing tumors of gynecological and renal origin Staging form: Ovary, Fallopian Tube, and Primary Peritoneal Carcinoma, AJCC 8th Edition - Pathologic stage from 02/18/2020: FIGO Stage IVA (pM1a) - Signed by  Earlie Server, MD on 08/12/2020   02/25/2020 Imaging   PET scan initial staging 1. Exam shows evidence of peritoneal disease within the abdomen including FDG avid soft tissue infiltration into the omentum.  2. Large, loculated left pleural effusion with thin, FDG avid rind of soft tissue overlying the  left lung. This corresponds to known, pathology proven malignant pleural effusion. 3. Small right pleural effusion with mild FDG uptake is equivocal for malignant effusion of the right hemithorax. 4. Fluid density structure within right side of pelvis abutting the right-side of the uterus is noted. This is indeterminate. Further investigation with pelvic sonogram may be helpful.   03/04/2020 - 05/06/2020 Chemotherapy   4 cycles of neoadjuvant carbo and Taxol   04/24/2020 Imaging   CT chest abdomen pelvis with contrast showed 1. Moderate left malignant pleural effusion, decreased in size compared to the prior examination. 2. Omental caking highly concerning for intraperitoneal metastatic disease. 3. No definite primary malignancy confidently identified on today's examination. 4. Stable nodule in the lateral limb of the left adrenal gland dating back to at least 2017, presumably a benign lesions such as an adrenal adenoma.5. Aortic atherosclerosis.6. Additional incidental findings, as above.     05/21/2020 Imaging   CT chest with contrast 1. Small left pleural effusion similar to prior CT. Overall no significant interval change compared to the prior CT. 2. Stable left adrenal nodule. 3. Aortic Atherosclerosis (ICD10-I70.0).   06/05/2020 Procedure    status post Total hysterectomy and bilateral salpingo-oophorectomy, Omentectomy, and Optimal (R<1) interval tumor debulking. A. Peritoneal nodule, excisional biopsy: Fibroadipose tissue with calcifications, negative for tumor. B. Uterus, bilateral ovaries and fallopian tubes, hysterectomy and bilateral salpingo-oophorectomy: Cervix: Squamous metaplasia. Endometrium: Inactive endometrium. Myometrium: Adenomyosis. Serosa: High grade serous adenocarcinoma. Right ovary: High grade serous adenocarcinoma, involving the ovarian surface and parenchyma. Right fallopian tube: No specific pathologic change. Left ovary: High grade serous  adenocarcinoma, involving the ovarian surface and parenchyma. Left fallopian tube: High grade serous adenocarcinoma, involving tubal fimbria. Serous tubal intraepithelial carcinoma. Immunohistochemistry was performed on block B14 at [facility] to characterize the pathologic process and demonstrates the following immunophenotype in the cells of interest:POSITIVE:  p53, p16 (strong, patchy), Ki67 (elevated) This staining profile supports the above diagnosis. C. Peritoneal nodule, left pericolic gutter, excisional biopsy: High grade serous adenocarcinoma. D. Cul-de-sac, excisional biopsy: High grade serous adenocarcinoma. E. Omentum, excision: High grade serous adenocarcinoma (up to 3.0 cm).   TUMOR   Tumor Site:    Left fallopian tube   Histologic Type:    Serous carcinoma   Histologic Grade:    High grade   Tumor Size:    Greatest Dimension (Centimeters): 0.2 cm   Ovarian Surface Involvement:    Present     Laterality:    Bilateral   Fallopian Tube Surface Involvement:    Present     Laterality:    Left   Implants:    Present (sites): left peritoneum, cul-de-sac, omentum   Other Tissue / Organ Involvement:    Right ovary   Other Tissue / Organ Involvement:    Left ovary   Other Tissue / Organ Involvement:    Left fallopian tube   Other Tissue / Organ Involvement:    Pelvic peritoneum   Other Tissue / Organ Involvement:    Omentum   Largest Extrapelvic Peritoneal Focus:    Macroscopic (greater than 2 cm)   Peritoneal / Ascitic Fluid:    Not submitted / unknown   Pleural Fluid:  Not submitted / unknown   Treatment Effect:    Moderate response identified (CRS 2)   LYMPH NODES   Regional Lymph Nodes:    No lymph nodes submitted or found   PATHOLOGIC STAGE CLASSIFICATION (pTNM, AJCC 8th Edition)   TNM Descriptors:    y (post-treatment)   Primary Tumor (pT):    pT3c   Regional Lymph Nodes (pN):    pNX  FIGO Stage:    IIIC   + pleural effusion so STAGE IV No germline BRCA mutation,  -Homologous recombination deficiency positive.  Myriad Genomic instability score:  positive, somatic BRCA1 positive   06/25/2020 Initial Diagnosis   Carcinoma of fallopian tube ([place])   07/01/2020 - 08/12/2020 Chemotherapy   3 cycles of adjuvant chemotherapy with carboplatin AUC 5, Taxol 175 mg/m.     09/02/2020 -  Chemotherapy   started on olaparib 300 mg twice daily.   11/16/2020 Imaging   CT chest abdomen pelvis with contrast showed 1. Status post interval hysterectomy and bilateral salpingectomy. 2. Status post interval omentectomy. There is no residual suspicious peritoneal nodularity identified. Unchanged small nodules adjacent to the spleen which are stable compared to examinations dating back to 2017 and likely small splenules and/or lymph nodes. Attention on follow-up 3. Interval resolution of a previously seen moderate left pleural effusion. There is minimal residual pleural thickening. 4. Findings are consistent with treatment response.5. No evidence of new metastatic disease in the chest, abdomen, or pelvis.   Aortic Atherosclerosis (ICD10-I70.0).   03/18/2021 Imaging   CT chest abdomen pelvis with contrast showed Chest Impression: 1. No evidence metastatic disease in the thorax. 2. Stable borderline enlarged mediastinal lymph nodes and RIGHT hilar node. Recommend attention on follow-up. Abdomen / Pelvis Impression: 1. No evidence of peritoneal or omental nodal metastatic recurrence. 2. No evidence of local recurrence in the pelvis. 3. No free fluid in the abdomen pelvis.    07/14/2021 Imaging   CT chest abdomen pelvis with contrast showed 1. No evidence of new or progressive disease within the chest,abdomen, or pelvis. 2. Stable borderline enlarged mediastinal and right hilar lymph nodes, nonspecific and possibly reactive. Recommend attention on follow-up imaging. 3. Moderate volume of formed stool throughout the colon suggestive of constipation. 4. Stable 1.4 cm left  adrenal nodule previously characterized as an adenoma on PET-CT February 25, 2020. 5.  Aortic Atherosclerosis (ICD10-I70.0).   11/24/2021 Imaging   CT chest abdomen pelvis with contrast 1. No new or progressive findings in the chest, abdomen or pelvis.2. Scattered small perisplenic nodules are unchanged potentially small splenules. 3. Stable LEFT adrenal lesion, unchanged dating back to October of 2017. This likely represents an adenoma. 4. Post hysterectomy.5. Aortic atherosclerosis.   04/07/2022 Imaging   CT chest abdomen pelvis without contrast 1. Status post hysterectomy, oophorectomy, and omentectomy. No noncontrast evidence of recurrent or metastatic disease in the chest, abdomen, or pelvis. 2. Multiple small nodules adjacent to the spleen in the left upper quadrant, measuring up to 0.9 x 0.7 cm. These are again most likely small accessory splenules, not previously FDG avid. Attention on follow-up. 3. Minimal emphysema, diffuse bilateral bronchial wall thickening, and background of very fine centrilobular nodules, most concentrated in the lung apices, consistent with smoking-related respiratory bronchiolitis.  Aortic Atherosclerosis (ICD10-I70.0) and Emphysema (ICD10-J43.9).      INTERVAL HISTORY 66 year old female presents for follow-up of stage IV ovarian cancer,  somatic BRCA1 positive and HRD positive.  Patient has been on olaparib since January 2022.   Overall patient tolerates  well denies any nausea vomiting diarrhea.  She feels well.   + Neuropathy symptoms, off lyrica. Not able to tolerate Lyrica 18m BID   Review of Systems  Constitutional:  Negative for appetite change, chills, fatigue and fever.  HENT:   Negative for hearing loss and voice change.   Eyes:  Negative for eye problems.  Respiratory:  Negative for chest tightness and cough.   Cardiovascular:  Negative for chest pain.  Gastrointestinal:  Negative for abdominal distention, abdominal pain and blood in stool.   Endocrine: Negative for hot flashes.  Genitourinary:  Negative for difficulty urinating and frequency.   Musculoskeletal:  Negative for arthralgias.  Skin:  Negative for itching and rash.  Neurological:  Positive for numbness. Negative for extremity weakness.  Hematological:  Negative for adenopathy.  Psychiatric/Behavioral:  Positive for sleep disturbance. Negative for confusion.         No Known Allergies   Past Medical History:  Diagnosis Date   Cancer ([place]    ovarian cancer   Family history of breast cancer    Genetic testing 04/09/2020   No pathogenic variants identified on the Myriad MyRisk (germline) panel. The report date is 04/07/2020.   The [facility]gene panel offered by [employer] sequencing and deletion/duplication testing of the following 35 genes: APC, ATM, AXIN2, BARD1, BMPR1A, BRCA1, BRCA2, BRIP1, CHD1, CDK4, CDKN2A, CHEK2, EPCAM (large rearrangement only), HOXB13, GALNT12, MLH1, MSH2, MSH3, MSH6   HLD (hyperlipidemia)      Past Surgical History:  Procedure Laterality Date   CARPAL TUNNEL RELEASE Left    COLONOSCOPY WITH PROPOFOL N/A 12/10/2020   Procedure: COLONOSCOPY WITH PROPOFOL;  Surgeon: TVirgel Manifold MD;  Location: [facility] ENDOSCOPY;  Service: Endoscopy;  Laterality: N/A;   ROBOTIC ASSISTED TOTAL HYSTERECTOMY WITH BILATERAL SALPINGO OOPHERECTOMY  06/05/2020   with omentectomy tumor debulking via mini-laparotomy/hand assisted port   SHOULDER ARTHROSCOPY WITH SUBACROMIAL DECOMPRESSION AND OPEN ROTATOR C Right 02/01/2020   Procedure: RIGHT SHOULDER ARTHROSCOPIC SUBSCAPULARIS REPAIR, ROTATOR CUFF REPAIR, SUABCROMIAL DECOMPRESSION, AND BICEPS TENODESIS.;  Surgeon: PLeim Fabry MD;  Location: [facility] ORS;  Service: Orthopedics;  Laterality: Right;   TUBAL LIGATION      Social History   Socioeconomic History   Marital status: Single    Spouse name: Not on file   Number of children: Not on file   Years of education: Not on file    Highest education level: Not on file  Occupational History   Not on file  Tobacco Use   Smoking status: Former    Packs/day: 0.75    Years: 40.00    Total pack years: 30.00    Types: Cigarettes    Quit date: 02/06/2018    Years since quitting: 4.3   Smokeless tobacco: Never   Tobacco comments:    quit 02/2018  Vaping Use   Vaping Use: Never used  Substance and Sexual Activity   Alcohol use: Yes    Comment: 2-3 glasses of wine per week,none last 24hrs   Drug use: No   Sexual activity: Not on file  Other Topics Concern   Not on file  Social History Narrative   [place] Work - [employer]   Diet: Regular   Exercise: walking   Social Determinants of HRadio broadcast assistantStrain: Low Risk  (11/27/2021)   Overall Financial Resource Strain (CARDIA)    Difficulty of Paying Living Expenses: Not hard at all  Food Insecurity: No Food Insecurity (11/27/2021)   Hunger  Vital Sign    Worried About Charity fundraiser in the Last Year: Never true    Ran Out of Food in the Last Year: Never true  Transportation Needs: No Transportation Needs (11/27/2021)   PRAPARE - Hydrologist (Medical): No    Lack of Transportation (Non-Medical): No  Physical Activity: Not on file  Stress: No Stress Concern Present (11/27/2021)   [place]    Feeling of Stress : Not at all  Social Connections: Unknown (11/27/2021)   Social Connection and Isolation Panel [NHANES]    Frequency of Communication with Friends and Family: More than three times a week    Frequency of Social Gatherings with Friends and Family: More than three times a week    Attends Religious Services: Not on file    Active Member of [place] or Organizations: Not on file    Attends Archivist Meetings: Not on file    Marital Status: Not on file  Intimate Partner Violence: Not At Risk (11/27/2021)   Humiliation, Afraid,  Rape, and Kick questionnaire    Fear of Current or Ex-Partner: No    Emotionally Abused: No    Physically Abused: No    Sexually Abused: No    Family History  Problem Relation Age of Onset   Cancer Mother        lymphoma   Stroke Sister    Cancer Brother        lung   Cancer Sister        lung   Lung cancer Brother    Bone cancer Sister    Breast cancer Cousin 52       maternal   Cancer Maternal Aunt        unk types     Current Outpatient Medications:    cholecalciferol (VITAMIN D3) 10 MCG (400 UNIT) TABS tablet, Take 800 Units by mouth daily., Disp: , Rfl:    cyanocobalamin 1000 MCG tablet, Take 400 mcg by mouth daily., Disp: , Rfl:    olaparib (LYNPARZA) 150 MG tablet, Take 2 tablets (300 mg total) by mouth 2 (two) times daily. May take with food to decrease nausea and vomiting., Disp: 120 tablet, Rfl: 2   pravastatin (PRAVACHOL) 40 MG tablet, TAKE 1 TABLET BY MOUTH EVERY DAY IN THE EVENING, Disp: 90 tablet, Rfl: 3   pregabalin (LYRICA) 100 MG capsule, Take 1 capsule (100 mg total) by mouth 2 (two) times daily. (Patient not taking: Reported on 06/23/2022), Disp: 60 capsule, Rfl: 2  Physical exam:  Vitals:   06/23/22 1021  BP: 127/72  Pulse: 70  Temp: 98.9 F (37.2 C)   Physical Exam Constitutional:      General: She is not in acute distress.    Appearance: She is not diaphoretic.  HENT:     Head: Normocephalic and atraumatic.     Nose: Nose normal.     Mouth/Throat:     Pharynx: No oropharyngeal exudate.  Eyes:     General: No scleral icterus.    Pupils: Pupils are equal, round, and reactive to light.  Cardiovascular:     Rate and Rhythm: Normal rate and regular rhythm.     Heart sounds: No murmur heard. Pulmonary:     Effort: Pulmonary effort is normal. No respiratory distress.     Breath sounds: No rales.  Chest:     Chest wall: No tenderness.  Abdominal:  General: There is no distension.     Palpations: Abdomen is soft.     Tenderness: There is  no abdominal tenderness.  Musculoskeletal:        General: Normal range of motion.     Cervical back: Normal range of motion and neck supple.  Skin:    General: Skin is warm and dry.     Findings: No erythema.  Neurological:     Mental Status: She is alert and oriented to person, place, and time.     Cranial Nerves: No cranial nerve deficit.     Motor: No abnormal muscle tone.     Coordination: Coordination normal.  Psychiatric:        Mood and Affect: Affect normal.     Labs are reviewed and discussed with patient.    Latest Ref Rng & Units 06/23/2022    9:56 AM 05/11/2022   10:13 AM 04/09/2022   11:28 AM  CBC  WBC 4.0 - 10.5 K/uL 4.4  4.4  4.3   Hemoglobin 12.0 - 15.0 g/dL 11.6  11.6  11.8   Hematocrit 36.0 - 46.0 % 35.0  34.4  35.4   Platelets 150 - 400 K/uL 121  169  175       Latest Ref Rng & Units 06/23/2022    9:56 AM 05/11/2022   10:13 AM 04/09/2022   11:28 AM  CMP  Glucose 70 - 99 mg/dL 129  116  108   BUN 8 - 23 mg/dL _0 Creatinine 0.44 - 1.00 mg/dL 1.09  1.07  0.93   Sodium 135 - 145 mmol/L 136  138  137   Potassium 3.5 - 5.1 mmol/L 3.2  3.5  3.5   Chloride 98 - 111 mmol/L 105  105  104   CO2 22 - 32 mmol/L _1 Calcium 8.9 - 10.3 mg/dL 9.0  9.2  9.1   Total Protein 6.5 - 8.1 g/dL 7.1  6.9  7.5   Total Bilirubin 0.3 - 1.2 mg/dL 0.5  0.7  0.7   Alkaline Phos 38 - 126 U/L 47  45  46   AST 15 - 41 U/L _2 ALT 0 - 44 U/L _3 RADIOGRAPHIC STUDIES: I have personally reviewed the radiological images as listed and agreed with the findings in the report. CT CHEST ABDOMEN PELVIS WO CONTRAST  Result Date: 04/07/2022 CLINICAL DATA:  Restaging fallopian tube cancer, status post hysterectomy, oophorectomy, salpingectomy, omentectomy, and debulking * Tracking Code: BO * EXAM: CT CHEST, ABDOMEN AND PELVIS WITHOUT CONTRAST TECHNIQUE: Multidetector CT imaging of the chest, abdomen and pelvis was performed following the standard protocol  without IV contrast. Oral enteric contrast was administered RADIATION DOSE REDUCTION: This exam was performed according to the departmental dose-optimization program which includes automated exposure control, adjustment of the mA and/or kV according to patient size and/or use of iterative reconstruction technique. COMPARISON:  11/23/2021 FINDINGS: CT CHEST FINDINGS Cardiovascular: Aortic atherosclerosis. Normal heart size. No pericardial effusion. Mediastinum/Nodes: No enlarged mediastinal, hilar, or axillary lymph nodes. Unchanged small anterior mediastinal lymph node or thymic nodule measuring 0.6 cm (series 2, image 18). Thyroid gland, trachea, and esophagus demonstrate no significant findings. Lungs/Pleura: Minimal centrilobular and paraseptal emphysema. Background of very fine centrilobular nodules, most concentrated in the lung apices. Diffuse bilateral bronchial wall thickening. No pleural effusion or pneumothorax. Musculoskeletal: No chest wall abnormality.  No acute osseous findings. CT ABDOMEN PELVIS FINDINGS Hepatobiliary: No solid liver abnormality is seen. No gallstones, gallbladder wall thickening, or biliary dilatation. Pancreas: Unremarkable. No pancreatic ductal dilatation or surrounding inflammatory changes. Spleen: Normal in size without significant abnormality. Multiple small nodules adjacent to the spleen in the left upper quadrant, measuring up to 0.9 x 0.7 cm (series 2, image 57). Adrenals/Urinary Tract: Unchanged, benign adenoma of the lateral limb of the left adrenal gland measuring 1.6 x 1.5 cm (series 2, image 54). Normal right adrenal gland. Kidneys are normal, without renal calculi, solid lesion, or hydronephrosis. Bladder is unremarkable. Stomach/Bowel: Stomach is within normal limits. Appendix appears normal. No evidence of bowel wall thickening, distention, or inflammatory changes. Status post omentectomy. Vascular/Lymphatic: Aortic atherosclerosis. No enlarged abdominal or pelvic  lymph nodes. Reproductive: Status post hysterectomy and oophorectomy. Other: No abdominal wall hernia or abnormality. No ascites. Musculoskeletal: No acute osseous findings. IMPRESSION: 1. Status post hysterectomy, oophorectomy, and omentectomy. No noncontrast evidence of recurrent or metastatic disease in the chest, abdomen, or pelvis. 2. Multiple small nodules adjacent to the spleen in the left upper quadrant, measuring up to 0.9 x 0.7 cm. These are again most likely small accessory splenules, not previously FDG avid. Attention on follow-up. 3. Minimal emphysema, diffuse bilateral bronchial wall thickening, and background of very fine centrilobular nodules, most concentrated in the lung apices, consistent with smoking-related respiratory bronchiolitis. Aortic Atherosclerosis (ICD10-I70.0) and Emphysema (ICD10-J43.9). Electronically Signed   By: Delanna Ahmadi M.D.   On: 04/07/2022 14:44

## 2022-06-23 NOTE — Assessment & Plan Note (Signed)
Patient takes potassium rich food.  K 3.2 Recommend potassium 37mq daily. She reports having some supply at home and no need for Rx.

## 2022-06-23 NOTE — Assessment & Plan Note (Addendum)
+  pleural effusion cytology  -( somatic BRCA1 positive, HRD positive). S/p 4 cycles of neoadjuvant chemotherapy  Carboplatin /Taxol  S/p total hysterectomy with salpingo-oophorectomy and omentectomy. Optimal (R<1) interval tumor debulking, -followed by 3 cycles of adjuvant carboplatin/Taxol. Labs reviewed and discussed with patient.  CA125 is stable. Continue current regimen olaparib 300 mg twice daily. Check CT in Dec 2023

## 2022-06-23 NOTE — Assessment & Plan Note (Signed)
She can not tolerate Lyrica to 100 mg twice daily.  Patient missed her Telemedicine with neurology. Encourage patient to reschedule. She may try lower dose of Lyrica '50mg'$  BID

## 2022-06-23 NOTE — Progress Notes (Signed)
Pt here for follow up. Pt reports she had a virtual visit with Dr. Mickeal Skinner and he prescribed lyrica '100mg'$  BID. Pt states she only took it 1 day because it made her feel very dizzy.

## 2022-06-23 NOTE — Assessment & Plan Note (Signed)
Continue current chemotherapy treatments. 

## 2022-06-23 NOTE — Assessment & Plan Note (Signed)
Avoid nephrotoxins.

## 2022-06-24 LAB — CA 125: Cancer Antigen (CA) 125: 3.7 U/mL (ref 0.0–38.1)

## 2022-08-11 ENCOUNTER — Ambulatory Visit
Admission: RE | Admit: 2022-08-11 | Discharge: 2022-08-11 | Disposition: A | Discharge status: Home / Self Care | Payer: Medicare HMO | Source: Ambulatory Visit | Attending: Oncology | Admitting: Oncology

## 2022-08-11 DIAGNOSIS — K668 Other specified disorders of peritoneum: Secondary | ICD-10-CM | POA: Diagnosis not present

## 2022-08-11 DIAGNOSIS — K661 Hemoperitoneum: Secondary | ICD-10-CM | POA: Diagnosis not present

## 2022-08-11 DIAGNOSIS — D3502 Benign neoplasm of left adrenal gland: Secondary | ICD-10-CM | POA: Diagnosis not present

## 2022-08-11 DIAGNOSIS — M4856XA Collapsed vertebra, not elsewhere classified, lumbar region, initial encounter for fracture: Secondary | ICD-10-CM | POA: Diagnosis not present

## 2022-08-11 DIAGNOSIS — C5700 Malignant neoplasm of unspecified fallopian tube: Secondary | ICD-10-CM | POA: Insufficient documentation

## 2022-08-11 DIAGNOSIS — J8410 Pulmonary fibrosis, unspecified: Secondary | ICD-10-CM | POA: Diagnosis not present

## 2022-08-11 DIAGNOSIS — J432 Centrilobular emphysema: Secondary | ICD-10-CM | POA: Diagnosis not present

## 2022-08-11 DIAGNOSIS — I700 Atherosclerosis of aorta: Secondary | ICD-10-CM | POA: Diagnosis not present

## 2022-08-11 DIAGNOSIS — C569 Malignant neoplasm of unspecified ovary: Secondary | ICD-10-CM | POA: Diagnosis not present

## 2022-08-16 ENCOUNTER — Inpatient Hospital Stay: Payer: Medicare HMO | Attending: Oncology

## 2022-08-16 ENCOUNTER — Inpatient Hospital Stay (HOSPITAL_BASED_OUTPATIENT_CLINIC_OR_DEPARTMENT_OTHER): Payer: Medicare HMO | Admitting: Oncology

## 2022-08-16 ENCOUNTER — Encounter: Payer: Self-pay | Admitting: Oncology

## 2022-08-16 VITALS — BP 139/76 | HR 80 | Resp 18 | Wt 217.0 lb

## 2022-08-16 DIAGNOSIS — E876 Hypokalemia: Secondary | ICD-10-CM

## 2022-08-16 DIAGNOSIS — G629 Polyneuropathy, unspecified: Secondary | ICD-10-CM

## 2022-08-16 DIAGNOSIS — N183 Chronic kidney disease, stage 3 unspecified: Secondary | ICD-10-CM | POA: Diagnosis not present

## 2022-08-16 DIAGNOSIS — Z87891 Personal history of nicotine dependence: Secondary | ICD-10-CM | POA: Diagnosis not present

## 2022-08-16 DIAGNOSIS — Z79899 Other long term (current) drug therapy: Secondary | ICD-10-CM | POA: Insufficient documentation

## 2022-08-16 DIAGNOSIS — C5700 Malignant neoplasm of unspecified fallopian tube: Secondary | ICD-10-CM

## 2022-08-16 DIAGNOSIS — N1831 Chronic kidney disease, stage 3a: Secondary | ICD-10-CM | POA: Diagnosis not present

## 2022-08-16 DIAGNOSIS — J910 Malignant pleural effusion: Secondary | ICD-10-CM | POA: Insufficient documentation

## 2022-08-16 DIAGNOSIS — Z5111 Encounter for antineoplastic chemotherapy: Secondary | ICD-10-CM

## 2022-08-16 DIAGNOSIS — G620 Drug-induced polyneuropathy: Secondary | ICD-10-CM | POA: Diagnosis not present

## 2022-08-16 DIAGNOSIS — T451X5A Adverse effect of antineoplastic and immunosuppressive drugs, initial encounter: Secondary | ICD-10-CM | POA: Insufficient documentation

## 2022-08-16 LAB — CBC WITH DIFFERENTIAL/PLATELET
Abs Immature Granulocytes: 0.02 10*3/uL (ref 0.00–0.07)
Basophils Absolute: 0 10*3/uL (ref 0.0–0.1)
Basophils Relative: 0 %
Eosinophils Absolute: 0.1 10*3/uL (ref 0.0–0.5)
Eosinophils Relative: 1 %
HCT: 36.7 % (ref 36.0–46.0)
Hemoglobin: 12.4 g/dL (ref 12.0–15.0)
Immature Granulocytes: 0 %
Lymphocytes Relative: 27 %
Lymphs Abs: 1.4 10*3/uL (ref 0.7–4.0)
MCH: 34.4 pg — ABNORMAL HIGH (ref 26.0–34.0)
MCHC: 33.8 g/dL (ref 30.0–36.0)
MCV: 101.9 fL — ABNORMAL HIGH (ref 80.0–100.0)
Monocytes Absolute: 0.5 10*3/uL (ref 0.1–1.0)
Monocytes Relative: 9 %
Neutro Abs: 3.2 10*3/uL (ref 1.7–7.7)
Neutrophils Relative %: 63 %
Platelets: 136 10*3/uL — ABNORMAL LOW (ref 150–400)
RBC: 3.6 MIL/uL — ABNORMAL LOW (ref 3.87–5.11)
RDW: 14.4 % (ref 11.5–15.5)
WBC: 5.1 10*3/uL (ref 4.0–10.5)
nRBC: 0 % (ref 0.0–0.2)

## 2022-08-16 LAB — COMPREHENSIVE METABOLIC PANEL
ALT: 15 U/L (ref 0–44)
AST: 21 U/L (ref 15–41)
Albumin: 4.3 g/dL (ref 3.5–5.0)
Alkaline Phosphatase: 54 U/L (ref 38–126)
Anion gap: 6 (ref 5–15)
BUN: 14 mg/dL (ref 8–23)
CO2: 25 mmol/L (ref 22–32)
Calcium: 9.1 mg/dL (ref 8.9–10.3)
Chloride: 106 mmol/L (ref 98–111)
Creatinine, Ser: 1.13 mg/dL — ABNORMAL HIGH (ref 0.44–1.00)
GFR, Estimated: 53 mL/min — ABNORMAL LOW (ref >60)
Glucose, Bld: 122 mg/dL — ABNORMAL HIGH (ref 70–99)
Potassium: 3.6 mmol/L (ref 3.5–5.1)
Sodium: 137 mmol/L (ref 135–145)
Total Bilirubin: 1 mg/dL (ref 0.3–1.2)
Total Protein: 7 g/dL (ref 6.5–8.1)

## 2022-08-16 MED ORDER — OLAPARIB 150 MG PO TABS: 300.0000 mg | ORAL_TABLET | Freq: Two times a day (BID) | ORAL | 2 refills | Status: DC

## 2022-08-16 NOTE — Assessment & Plan Note (Signed)
Continue current chemotherapy treatments. 

## 2022-08-16 NOTE — Progress Notes (Signed)
Right ear pain sharp shooting pain mostly at night

## 2022-08-16 NOTE — Assessment & Plan Note (Signed)
Patient takes potassium rich food.  K has improved.  Recommend potassium 66mq daily.

## 2022-08-16 NOTE — Assessment & Plan Note (Addendum)
She can not tolerate Lyrica to 100 mg twice daily.  Patient missed her Telemedicine with neurology. Encourage patient to reschedule. She may try lower dose of Lyrica '50mg'$  BID  She may also have symptoms indicating right trigeminal neuralgia, intermittent.

## 2022-08-16 NOTE — Assessment & Plan Note (Signed)
Encourage oral hydration and avoid nephrotoxins.   

## 2022-08-16 NOTE — Progress Notes (Signed)
Hematology/Oncology Progress note [phone] [phone]     Patient Care Team: Burnard Hawthorne, FNP as PCP - General (Family Medicine) Clent Jacks, RN as Oncology Nurse Navigator Earlie Server, MD as Consulting Physician (Oncology) Mellody Drown, MD as Referring Physician (Gynecologic Oncology) Gillis Ends, MD as Referring Physician (Gynecologic Oncology) Virgel Manifold, MD (Inactive) as Consulting Physician (Gastroenterology)   Name of the patient: Linda Schmitt  [id number]  02/12/1956   ASSESSMENT & PLAN:   Cancer Staging  Carcinoma of fallopian tube [facility]) Staging form: Ovary, Fallopian Tube, and Primary Peritoneal Carcinoma, AJCC 8th Edition - Pathologic stage from 02/18/2020: FIGO Stage IVA (pM1a) - Signed by Earlie Server, MD on 08/12/2020   Carcinoma of fallopian tube ([place]) + pleural effusion cytology  -( somatic BRCA1 positive, HRD positive). S/p 4 cycles of neoadjuvant chemotherapy  Carboplatin /Taxol  S/p total hysterectomy with salpingo-oophorectomy and omentectomy. Optimal (R<1) interval tumor debulking, -followed by 3 cycles of adjuvant carboplatin/Taxol. Labs reviewed and discussed with patient.  CA125 is stable. Continue current regimen olaparib 300 mg twice daily. CT in Dec 2023 showed no recurrence.    Neuropathy She can not tolerate Lyrica to 100 mg twice daily.  Patient missed her Telemedicine with neurology. Encourage patient to reschedule. She may try lower dose of Lyrica '50mg'$  BID  She may also have symptoms indicating right trigeminal neuralgia, intermittent.   Hypokalemia Patient takes potassium rich food.  K has improved.  Recommend potassium 27mq daily.   Encounter for antineoplastic chemotherapy Continue current chemotherapy treatments.  CKD (chronic kidney disease) stage 3, GFR 30-59 ml/min (HCC) Encourage oral  hydration and avoid nephrotoxins.     Patient declined influenza vaccination  Orders Placed This Encounter  Procedures   CBC with Differential/Platelet    Standing Status:   Future    Standing Expiration Date:   08/16/2023   Comprehensive metabolic panel    Standing Status:   Future    Standing Expiration Date:   08/16/2023   CA 125    Standing Status:   Future    Standing Expiration Date:   08/16/2023   Amb Referral to Neuro Oncology    Referral Priority:   Routine    Referral Type:   Consultation    Referral Reason:   Specialty Services Required    Number of Visits Requested:   1   Follow up in 6 weeks  All questions were answered. The patient knows to call the clinic with any problems, questions or concerns.  ZEarlie Server MD, PhD [facility] H[facility] Oncology 08/16/2022    PERTINENT ONCOLOGY HISTORY Linda GLATTis a 67y.o.afemale who has above oncology history reviewed by me presents for follow up of carcinoma of fallopian tube   Oncology History  [place]  02/18/2020 Cancer Staging    -status post thoracentesis x2 during her recent admission. Cytology was positive for metastatic carcinoma.  TTF-1 Napsin A, GATA3, CDX2 were negative. Positive for PAX 8, which is most often expressing tumors of gynecological and renal origin Staging form: Ovary, Fallopian Tube, and Primary Peritoneal Carcinoma, AJCC 8th Edition - Pathologic stage from 02/18/2020: FIGO Stage IVA (pM1a) - Signed by YEarlie Server MD on 08/12/2020   02/25/2020 Imaging   PET scan initial staging 1. Exam shows evidence of peritoneal disease within the abdomen including FDG avid soft tissue infiltration into the omentum.  2. Large, loculated left pleural effusion with thin, FDG avid rind of soft  tissue overlying the left lung. This corresponds to known, pathology proven malignant pleural effusion. 3. Small right pleural effusion with mild FDG uptake is equivocal for malignant effusion of the  right hemithorax. 4. Fluid density structure within right side of pelvis abutting the right-side of the uterus is noted. This is indeterminate. Further investigation with pelvic sonogram may be helpful.   03/04/2020 - 05/06/2020 Chemotherapy   4 cycles of neoadjuvant carbo and Taxol   04/24/2020 Imaging   CT chest abdomen pelvis with contrast showed 1. Moderate left malignant pleural effusion, decreased in size compared to the prior examination. 2. Omental caking highly concerning for intraperitoneal metastatic disease. 3. No definite primary malignancy confidently identified on today's examination. 4. Stable nodule in the lateral limb of the left adrenal gland dating back to at least 2017, presumably a benign lesions such as an adrenal adenoma.5. Aortic atherosclerosis.6. Additional incidental findings, as above.     05/21/2020 Imaging   CT chest with contrast 1. Small left pleural effusion similar to prior CT. Overall no significant interval change compared to the prior CT. 2. Stable left adrenal nodule. 3. Aortic Atherosclerosis (ICD10-I70.0).   06/05/2020 Procedure    status post Total hysterectomy and bilateral salpingo-oophorectomy, Omentectomy, and Optimal (R<1) interval tumor debulking. A. Peritoneal nodule, excisional biopsy: Fibroadipose tissue with calcifications, negative for tumor. B. Uterus, bilateral ovaries and fallopian tubes, hysterectomy and bilateral salpingo-oophorectomy: Cervix: Squamous metaplasia. Endometrium: Inactive endometrium. Myometrium: Adenomyosis. Serosa: High grade serous adenocarcinoma. Right ovary: High grade serous adenocarcinoma, involving the ovarian surface and parenchyma. Right fallopian tube: No specific pathologic change. Left ovary: High grade serous adenocarcinoma, involving the ovarian surface and parenchyma. Left fallopian tube: High grade serous adenocarcinoma, involving tubal fimbria. Serous tubal intraepithelial  carcinoma. Immunohistochemistry was performed on block B14 at [facility] to characterize the pathologic process and demonstrates the following immunophenotype in the cells of interest:POSITIVE:  p53, p16 (strong, patchy), Ki67 (elevated) This staining profile supports the above diagnosis. C. Peritoneal nodule, left pericolic gutter, excisional biopsy: High grade serous adenocarcinoma. D. Cul-de-sac, excisional biopsy: High grade serous adenocarcinoma. E. Omentum, excision: High grade serous adenocarcinoma (up to 3.0 cm).   TUMOR   Tumor Site:    Left fallopian tube   Histologic Type:    Serous carcinoma   Histologic Grade:    High grade   Tumor Size:    Greatest Dimension (Centimeters): 0.2 cm   Ovarian Surface Involvement:    Present     Laterality:    Bilateral   Fallopian Tube Surface Involvement:    Present     Laterality:    Left   Implants:    Present (sites): left peritoneum, cul-de-sac, omentum   Other Tissue / Organ Involvement:    Right ovary   Other Tissue / Organ Involvement:    Left ovary   Other Tissue / Organ Involvement:    Left fallopian tube   Other Tissue / Organ Involvement:    Pelvic peritoneum   Other Tissue / Organ Involvement:    Omentum   Largest Extrapelvic Peritoneal Focus:    Macroscopic (greater than 2 cm)   Peritoneal / Ascitic Fluid:    Not submitted / unknown   Pleural Fluid:    Not submitted / unknown   Treatment Effect:    Moderate response identified (CRS 2)   LYMPH NODES   Regional Lymph Nodes:    No lymph nodes submitted or found   PATHOLOGIC STAGE CLASSIFICATION (pTNM, AJCC 8th Edition)   TNM Descriptors:  y (post-treatment)   Primary Tumor (pT):    pT3c   Regional Lymph Nodes (pN):    pNX  FIGO Stage:    IIIC   + pleural effusion so STAGE IV No germline BRCA mutation, -Homologous recombination deficiency positive.  Myriad Genomic instability score:  positive, somatic BRCA1 positive   06/25/2020 Initial Diagnosis   Carcinoma of fallopian  tube ([place])   07/01/2020 - 08/12/2020 Chemotherapy   3 cycles of adjuvant chemotherapy with carboplatin AUC 5, Taxol 175 mg/m.     09/02/2020 -  Chemotherapy   started on olaparib 300 mg twice daily.   11/16/2020 Imaging   CT chest abdomen pelvis with contrast showed 1. Status post interval hysterectomy and bilateral salpingectomy. 2. Status post interval omentectomy. There is no residual suspicious peritoneal nodularity identified. Unchanged small nodules adjacent to the spleen which are stable compared to examinations dating back to 2017 and likely small splenules and/or lymph nodes. Attention on follow-up 3. Interval resolution of a previously seen moderate left pleural effusion. There is minimal residual pleural thickening. 4. Findings are consistent with treatment response.5. No evidence of new metastatic disease in the chest, abdomen, or pelvis.   Aortic Atherosclerosis (ICD10-I70.0).   03/18/2021 Imaging   CT chest abdomen pelvis with contrast showed Chest Impression: 1. No evidence metastatic disease in the thorax. 2. Stable borderline enlarged mediastinal lymph nodes and RIGHT hilar node. Recommend attention on follow-up. Abdomen / Pelvis Impression: 1. No evidence of peritoneal or omental nodal metastatic recurrence. 2. No evidence of local recurrence in the pelvis. 3. No free fluid in the abdomen pelvis.    07/14/2021 Imaging   CT chest abdomen pelvis with contrast showed 1. No evidence of new or progressive disease within the chest,abdomen, or pelvis. 2. Stable borderline enlarged mediastinal and right hilar lymph nodes, nonspecific and possibly reactive. Recommend attention on follow-up imaging. 3. Moderate volume of formed stool throughout the colon suggestive of constipation. 4. Stable 1.4 cm left adrenal nodule previously characterized as an adenoma on PET-CT February 25, 2020. 5.  Aortic Atherosclerosis (ICD10-I70.0).   08/12/2021 Imaging   CT chest abdomen pelvis wo  contrast  1. No evidence of new metastatic disease. 2. Left adrenal adenoma. 3.  Aortic atherosclerosis (ICD10-I70.0). 4.  Emphysema (ICD10-J43.9).   11/24/2021 Imaging   CT chest abdomen pelvis with contrast 1. No new or progressive findings in the chest, abdomen or pelvis.2. Scattered small perisplenic nodules are unchanged potentially small splenules. 3. Stable LEFT adrenal lesion, unchanged dating back to October of 2017. This likely represents an adenoma. 4. Post hysterectomy.5. Aortic atherosclerosis.   04/07/2022 Imaging   CT chest abdomen pelvis without contrast 1. Status post hysterectomy, oophorectomy, and omentectomy. No noncontrast evidence of recurrent or metastatic disease in the chest, abdomen, or pelvis. 2. Multiple small nodules adjacent to the spleen in the left upper quadrant, measuring up to 0.9 x 0.7 cm. These are again most likely small accessory splenules, not previously FDG avid. Attention on follow-up. 3. Minimal emphysema, diffuse bilateral bronchial wall thickening, and background of very fine centrilobular nodules, most concentrated in the lung apices, consistent with smoking-related respiratory bronchiolitis.  Aortic Atherosclerosis (ICD10-I70.0) and Emphysema (ICD10-J43.9).      INTERVAL HISTORY 67 year old female presents for follow-up of stage IV ovarian cancer,  somatic BRCA1 positive and HRD positive.  Patient has been on olaparib since January 2022.   Overall patient tolerates well denies any nausea vomiting diarrhea.  She feels well.   + Neuropathy symptoms, off lyrica.  Not able to tolerate Lyrica '100mg'$  BID + intermittent right ear/jaw area, sharp pain, spontaneously resolves.    Review of Systems  Constitutional:  Negative for appetite change, chills, fatigue and fever.  HENT:   Negative for hearing loss and voice change.   Eyes:  Negative for eye problems.  Respiratory:  Negative for chest tightness and cough.   Cardiovascular:  Negative for  chest pain.  Gastrointestinal:  Negative for abdominal distention, abdominal pain and blood in stool.  Endocrine: Negative for hot flashes.  Genitourinary:  Negative for difficulty urinating and frequency.   Musculoskeletal:  Negative for arthralgias.  Skin:  Negative for itching and rash.  Neurological:  Positive for numbness. Negative for extremity weakness.  Hematological:  Negative for adenopathy.  Psychiatric/Behavioral:  Positive for sleep disturbance. Negative for confusion.         No Known Allergies   Past Medical History:  Diagnosis Date   Cancer ([place])    ovarian cancer   Family history of breast cancer    Genetic testing 04/09/2020   No pathogenic variants identified on the Myriad MyRisk (germline) panel. The report date is 04/07/2020.   The [facility] gene panel offered by [employer] includes sequencing and deletion/duplication testing of the following 35 genes: APC, ATM, AXIN2, BARD1, BMPR1A, BRCA1, BRCA2, BRIP1, CHD1, CDK4, CDKN2A, CHEK2, EPCAM (large rearrangement only), HOXB13, GALNT12, MLH1, MSH2, MSH3, MSH6   HLD (hyperlipidemia)      Past Surgical History:  Procedure Laterality Date   CARPAL TUNNEL RELEASE Left    COLONOSCOPY WITH PROPOFOL N/A 12/10/2020   Procedure: COLONOSCOPY WITH PROPOFOL;  Surgeon: Virgel Manifold, MD;  Location: [facility] ENDOSCOPY;  Service: Endoscopy;  Laterality: N/A;   ROBOTIC ASSISTED TOTAL HYSTERECTOMY WITH BILATERAL SALPINGO OOPHERECTOMY  06/05/2020   with omentectomy tumor debulking via mini-laparotomy/hand assisted port   SHOULDER ARTHROSCOPY WITH SUBACROMIAL DECOMPRESSION AND OPEN ROTATOR C Right 02/01/2020   Procedure: RIGHT SHOULDER ARTHROSCOPIC SUBSCAPULARIS REPAIR, ROTATOR CUFF REPAIR, SUABCROMIAL DECOMPRESSION, AND BICEPS TENODESIS.;  Surgeon: Leim Fabry, MD;  Location: [facility] ORS;  Service: Orthopedics;  Laterality: Right;   TUBAL LIGATION      Social History   Socioeconomic History   Marital status: Single     Spouse name: Not on file   Number of children: Not on file   Years of education: Not on file   Highest education level: Not on file  Occupational History   Not on file  Tobacco Use   Smoking status: Former    Packs/day: 0.75    Years: 40.00    Total pack years: 30.00    Types: Cigarettes    Quit date: 02/06/2018    Years since quitting: 4.5   Smokeless tobacco: Never   Tobacco comments:    quit 02/2018  Vaping Use   Vaping Use: Never used  Substance and Sexual Activity   Alcohol use: Yes    Comment: 2-3 glasses of wine per week,none last 24hrs   Drug use: No   Sexual activity: Not on file  Other Topics Concern   Not on file  Social History Narrative   Lives in [place] alone. Work - [employer]   Diet: Regular   Exercise: walking   Social Determinants of Radio broadcast assistant Strain: Low Risk  (11/27/2021)   Overall Financial Resource Strain (CARDIA)    Difficulty of Paying Living Expenses: Not hard at all  Food Insecurity: No Food Insecurity (11/27/2021)   Hunger Vital Sign    Worried About  Running Out of Food in the Last Year: Never true    [place] in the Last Year: Never true  Transportation Needs: No Transportation Needs (11/27/2021)   PRAPARE - Hydrologist (Medical): No    Lack of Transportation (Non-Medical): No  Physical Activity: Not on file  Stress: No Stress Concern Present (11/27/2021)   [place]    Feeling of Stress : Not at all  Social Connections: Unknown (11/27/2021)   Social Connection and Isolation Panel [NHANES]    Frequency of Communication with Friends and Family: More than three times a week    Frequency of Social Gatherings with Friends and Family: More than three times a week    Attends Religious Services: Not on file    Active Member of [place] or Organizations: Not on file    Attends Archivist Meetings: Not on file     Marital Status: Not on file  Intimate Partner Violence: Not At Risk (11/27/2021)   Humiliation, Afraid, Rape, and Kick questionnaire    Fear of Current or Ex-Partner: No    Emotionally Abused: No    Physically Abused: No    Sexually Abused: No    Family History  Problem Relation Age of Onset   Cancer Mother        lymphoma   Stroke Sister    Cancer Brother        lung   Cancer Sister        lung   Lung cancer Brother    Bone cancer Sister    Breast cancer Cousin 64       maternal   Cancer Maternal Aunt        unk types     Current Outpatient Medications:    cholecalciferol (VITAMIN D3) 10 MCG (400 UNIT) TABS tablet, Take 800 Units by mouth daily., Disp: , Rfl:    cyanocobalamin 1000 MCG tablet, Take 400 mcg by mouth daily., Disp: , Rfl:    pravastatin (PRAVACHOL) 40 MG tablet, TAKE 1 TABLET BY MOUTH EVERY DAY IN THE EVENING, Disp: 90 tablet, Rfl: 3   olaparib (LYNPARZA) 150 MG tablet, Take 2 tablets (300 mg total) by mouth 2 (two) times daily. May take with food to decrease nausea and vomiting., Disp: 120 tablet, Rfl: 2   pregabalin (LYRICA) 100 MG capsule, Take 1 capsule (100 mg total) by mouth 2 (two) times daily. (Patient not taking: Reported on 06/23/2022), Disp: 60 capsule, Rfl: 2  Physical exam:  Vitals:   08/16/22 1032 08/16/22 1034  BP:  139/76  Pulse:  80  Resp:  18  SpO2:  100%  Weight: 217 lb (98.4 kg)    Physical Exam Constitutional:      General: She is not in acute distress.    Appearance: She is not diaphoretic.  HENT:     Head: Normocephalic and atraumatic.     Nose: Nose normal.     Mouth/Throat:     Pharynx: No oropharyngeal exudate.  Eyes:     General: No scleral icterus.    Pupils: Pupils are equal, round, and reactive to light.  Cardiovascular:     Rate and Rhythm: Normal rate and regular rhythm.     Heart sounds: No murmur heard. Pulmonary:     Effort: Pulmonary effort is normal. No respiratory distress.     Breath sounds: No rales.   Chest:     Chest wall:  No tenderness.  Abdominal:     General: There is no distension.     Palpations: Abdomen is soft.     Tenderness: There is no abdominal tenderness.  Musculoskeletal:        General: Normal range of motion.     Cervical back: Normal range of motion and neck supple.  Skin:    General: Skin is warm and dry.     Findings: No erythema.  Neurological:     Mental Status: She is alert and oriented to person, place, and time.     Cranial Nerves: No cranial nerve deficit.     Motor: No abnormal muscle tone.     Coordination: Coordination normal.  Psychiatric:        Mood and Affect: Affect normal.     Labs are reviewed and discussed with patient.    Latest Ref Rng & Units 08/16/2022    9:54 AM 06/23/2022    9:56 AM 05/11/2022   10:13 AM  CBC  WBC 4.0 - 10.5 K/uL 5.1  4.4  4.4   Hemoglobin 12.0 - 15.0 g/dL 12.4  11.6  11.6   Hematocrit 36.0 - 46.0 % 36.7  35.0  34.4   Platelets 150 - 400 K/uL 136  121  169       Latest Ref Rng & Units 08/16/2022    9:54 AM 06/23/2022    9:56 AM 05/11/2022   10:13 AM  CMP  Glucose 70 - 99 mg/dL 122  129  116   BUN 8 - 23 mg/dL '14  12  11   '$ Creatinine 0.44 - 1.00 mg/dL 1.13  1.09  1.07   Sodium 135 - 145 mmol/L 137  136  138   Potassium 3.5 - 5.1 mmol/L 3.6  3.2  3.5   Chloride 98 - 111 mmol/L 106  105  105   CO2 22 - 32 mmol/L '25  23  27   '$ Calcium 8.9 - 10.3 mg/dL 9.1  9.0  9.2   Total Protein 6.5 - 8.1 g/dL 7.0  7.1  6.9   Total Bilirubin 0.3 - 1.2 mg/dL 1.0  0.5  0.7   Alkaline Phos 38 - 126 U/L 54  47  45   AST 15 - 41 U/L '21  21  19   '$ ALT 0 - 44 U/L '15  14  14    '$ RADIOGRAPHIC STUDIES: I have personally reviewed the radiological images as listed and agreed with the findings in the report. CT CHEST ABDOMEN PELVIS WO CONTRAST  Result Date: 08/12/2022 CLINICAL DATA:  Ovarian cancer.  * Tracking Code: BO * EXAM: CT CHEST, ABDOMEN AND PELVIS WITHOUT CONTRAST TECHNIQUE: Multidetector CT imaging of the chest, abdomen and  pelvis was performed following the standard protocol without IV contrast. RADIATION DOSE REDUCTION: This exam was performed according to the departmental dose-optimization program which includes automated exposure control, adjustment of the mA and/or kV according to patient size and/or use of iterative reconstruction technique. COMPARISON:  04/07/2022. FINDINGS: CT CHEST FINDINGS Cardiovascular: Heart is at the upper limits of normal in size to mildly enlarged. No pericardial effusion. Mediastinum/Nodes: No pathologically enlarged mediastinal or axillary lymph nodes. Hilar regions are difficult to definitively evaluate without IV contrast. Esophagus is grossly unremarkable. Lungs/Pleura: Centrilobular emphysema. Calcified granulomas. No pleural fluid. Airway is unremarkable. Musculoskeletal: Degenerative changes in the spine. No worrisome lytic or sclerotic lesions. CT ABDOMEN PELVIS FINDINGS Hepatobiliary: Liver and gallbladder are unremarkable. No biliary ductal dilatation. Pancreas: Negative. Spleen: Probable  small splenules in the left upper quadrant. Adrenals/Urinary Tract: 1.6 cm left adrenal nodule measures 28 Hounsfield units but is stable from PET 02/25/2020, at which time it was not hypermetabolic. No specific follow-up necessary. Kidneys are unremarkable. Ureters are decompressed. Bladder is grossly unremarkable. Stomach/Bowel: Stomach, small bowel, appendix and colon are unremarkable. Vascular/Lymphatic: Atherosclerotic calcification of the aorta. No pathologically enlarged lymph nodes. Reproductive: No adnexal mass. Other: 5 mm nodule in the left mid abdominal peritoneum (2/61), chronically stable. No new peritoneal nodules. Mesenteries and peritoneum are otherwise unremarkable. No free fluid. Musculoskeletal: Degenerative changes in the spine. Old L1 superior endplate compression fracture. IMPRESSION: 1. No evidence of new metastatic disease. 2. Left adrenal adenoma. 3.  Aortic atherosclerosis  (ICD10-I70.0). 4.  Emphysema (ICD10-J43.9). Electronically Signed   By: Lorin Picket M.D.   On: 08/12/2022 14:46

## 2022-08-16 NOTE — Assessment & Plan Note (Addendum)
+   pleural effusion cytology  -( somatic BRCA1 positive, HRD positive). S/p 4 cycles of neoadjuvant chemotherapy  Carboplatin /Taxol  S/p total hysterectomy with salpingo-oophorectomy and omentectomy. Optimal (R<1) interval tumor debulking, -followed by 3 cycles of adjuvant carboplatin/Taxol. Labs reviewed and discussed with patient.  CA125 is stable. Continue current regimen olaparib 300 mg twice daily. CT in Dec 2023 showed no recurrence.

## 2022-08-17 LAB — CA 125: Cancer Antigen (CA) 125: 4.1 U/mL (ref 0.0–38.1)

## 2022-08-20 ENCOUNTER — Other Ambulatory Visit: Payer: Medicare Other

## 2022-08-20 ENCOUNTER — Ambulatory Visit: Payer: Medicare Other | Admitting: Oncology

## 2022-08-27 ENCOUNTER — Inpatient Hospital Stay (HOSPITAL_BASED_OUTPATIENT_CLINIC_OR_DEPARTMENT_OTHER): Payer: Medicare HMO | Admitting: Internal Medicine

## 2022-08-27 ENCOUNTER — Encounter: Payer: Self-pay | Admitting: Internal Medicine

## 2022-08-27 VITALS — BP 126/74 | HR 82 | Temp 97.6°F | Resp 19 | Wt 219.1 lb

## 2022-08-27 DIAGNOSIS — Z79899 Other long term (current) drug therapy: Secondary | ICD-10-CM | POA: Diagnosis not present

## 2022-08-27 DIAGNOSIS — N183 Chronic kidney disease, stage 3 unspecified: Secondary | ICD-10-CM | POA: Diagnosis not present

## 2022-08-27 DIAGNOSIS — C5700 Malignant neoplasm of unspecified fallopian tube: Secondary | ICD-10-CM | POA: Diagnosis not present

## 2022-08-27 DIAGNOSIS — G620 Drug-induced polyneuropathy: Secondary | ICD-10-CM | POA: Diagnosis not present

## 2022-08-27 DIAGNOSIS — T451X5A Adverse effect of antineoplastic and immunosuppressive drugs, initial encounter: Secondary | ICD-10-CM

## 2022-08-27 DIAGNOSIS — J910 Malignant pleural effusion: Secondary | ICD-10-CM | POA: Diagnosis not present

## 2022-08-27 DIAGNOSIS — E876 Hypokalemia: Secondary | ICD-10-CM | POA: Diagnosis not present

## 2022-08-27 DIAGNOSIS — Z87891 Personal history of nicotine dependence: Secondary | ICD-10-CM | POA: Diagnosis not present

## 2022-08-27 MED ORDER — GABAPENTIN 300 MG PO CAPS: 300.0000 mg | ORAL_CAPSULE | Freq: Every day | ORAL | 2 refills | Status: AC

## 2022-08-27 MED ORDER — DULOXETINE HCL 30 MG PO CPEP: 30.0000 mg | ORAL_CAPSULE | Freq: Every day | ORAL | 1 refills | Status: DC

## 2022-08-27 NOTE — Progress Notes (Signed)
Patient has no concerns today. 

## 2022-08-27 NOTE — Progress Notes (Signed)
[facility] at [place], [place] [address] [phone]   Interval Evaluation  Date of Service: 08/27/22 Patient Name: Linda Schmitt Patient MRN: [id number] Patient DOB: 12-01-1955 Provider: Ventura Sellers, MD  Identifying Statement:  Linda Schmitt is a 67 y.o. 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 Cytology was positive for metastatic carcinoma.  TTF-1 Napsin A, GATA3, CDX2 were negative. Positive for PAX 8, which is most often expressing tumors of gynecological and renal origin Staging form: Ovary, Fallopian Tube, and Primary Peritoneal Carcinoma, AJCC 8th Edition - Pathologic stage from 02/18/2020: FIGO Stage IVA (pM1a) - Signed by Earlie Server, MD on 08/12/2020   02/25/2020 Imaging   PET scan initial staging 1. Exam shows evidence of peritoneal disease within the abdomen including FDG avid soft tissue infiltration into the omentum.  2. Large, loculated left pleural effusion with thin, FDG avid rind of soft tissue overlying the left lung. This corresponds to known, pathology proven malignant pleural effusion. 3. Small right pleural effusion with mild FDG uptake is equivocal for malignant effusion of the right hemithorax. 4. Fluid density structure within right side of pelvis abutting the right-side of the uterus is noted. This is indeterminate. Further investigation with pelvic sonogram may be helpful.   03/04/2020 - 05/06/2020 Chemotherapy   4 cycles of neoadjuvant carbo and Taxol   04/24/2020 Imaging   CT chest abdomen pelvis with contrast showed 1. Moderate left malignant pleural effusion, decreased in size compared to the prior examination. 2. Omental caking highly concerning for intraperitoneal metastatic disease. 3. No definite  primary malignancy confidently identified on today's examination. 4. Stable nodule in the lateral limb of the left adrenal gland dating back to at least 2017, presumably a benign lesions such as an adrenal adenoma.5. Aortic atherosclerosis.6. Additional incidental findings, as above.     05/21/2020 Imaging   CT chest with contrast 1. Small left pleural effusion similar to prior CT. Overall no significant interval change compared to the prior CT. 2. Stable left adrenal nodule. 3. Aortic Atherosclerosis (ICD10-I70.0).   06/05/2020 Procedure    status post Total hysterectomy and bilateral salpingo-oophorectomy, Omentectomy, and Optimal (R<1) interval tumor debulking. A. Peritoneal nodule, excisional biopsy: Fibroadipose tissue with calcifications, negative for tumor. B. Uterus, bilateral ovaries and fallopian tubes, hysterectomy and bilateral salpingo-oophorectomy: Cervix: Squamous metaplasia. Endometrium: Inactive endometrium. Myometrium: Adenomyosis. Serosa: High grade serous adenocarcinoma. Right ovary: High grade serous adenocarcinoma, involving the ovarian surface and parenchyma. Right fallopian tube: No specific pathologic change. Left ovary: High grade serous adenocarcinoma, involving the ovarian surface and parenchyma. Left fallopian tube: High grade serous adenocarcinoma, involving tubal fimbria. Serous tubal intraepithelial carcinoma. Immunohistochemistry was performed on block B14 at [facility] to characterize the pathologic process and demonstrates the following immunophenotype in the cells of interest:POSITIVE:  p53, p16 (strong, patchy), Ki67 (elevated) This staining profile supports the above diagnosis. C. Peritoneal nodule, left pericolic gutter, excisional biopsy: High grade serous adenocarcinoma. D. Cul-de-sac, excisional biopsy: High grade serous adenocarcinoma. E. Omentum, excision: High grade serous adenocarcinoma (up to 3.0 cm).   TUMOR   Tumor Site:    Left  fallopian tube   Histologic Type:    Serous carcinoma   Histologic Grade:    High grade   Tumor Size:    Greatest Dimension (Centimeters): 0.2 cm  Ovarian Surface Involvement:    Present     Laterality:    Bilateral   Fallopian Tube Surface Involvement:    Present     Laterality:    Left   Implants:    Present (sites): left peritoneum, cul-de-sac, omentum   Other Tissue / Organ Involvement:    Right ovary   Other Tissue / Organ Involvement:    Left ovary   Other Tissue / Organ Involvement:    Left fallopian tube   Other Tissue / Organ Involvement:    Pelvic peritoneum   Other Tissue / Organ Involvement:    Omentum   Largest Extrapelvic Peritoneal Focus:    Macroscopic (greater than 2 cm)   Peritoneal / Ascitic Fluid:    Not submitted / unknown   Pleural Fluid:    Not submitted / unknown   Treatment Effect:    Moderate response identified (CRS 2)   LYMPH NODES   Regional Lymph Nodes:    No lymph nodes submitted or found   PATHOLOGIC STAGE CLASSIFICATION (pTNM, AJCC 8th Edition)   TNM Descriptors:    y (post-treatment)   Primary Tumor (pT):    pT3c   Regional Lymph Nodes (pN):    pNX  FIGO Stage:    IIIC   + pleural effusion so STAGE IV No germline BRCA mutation, -Homologous recombination deficiency positive.  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     09/02/2020 -  Chemotherapy   started on olaparib 300 mg twice daily.   11/16/2020 Imaging   CT chest abdomen pelvis with contrast showed 1. Status post interval hysterectomy and bilateral salpingectomy. 2. Status post interval omentectomy. There is no residual suspicious peritoneal nodularity identified. Unchanged small nodules adjacent to the spleen which are stable compared to examinations dating back to 2017 and likely  small splenules and/or lymph nodes. Attention on follow-up 3. Interval resolution of a previously seen moderate left pleural effusion. There is minimal residual pleural thickening. 4. Findings are consistent with treatment response.5. No evidence of new metastatic disease in the chest, abdomen, or pelvis.   Aortic Atherosclerosis (ICD10-I70.0).   03/18/2021 Imaging   CT chest abdomen pelvis with contrast showed Chest Impression: 1. No evidence metastatic disease in the thorax. 2. Stable borderline enlarged mediastinal lymph nodes and RIGHT hilar node. Recommend attention on follow-up. Abdomen / Pelvis Impression: 1. No evidence of peritoneal or omental nodal metastatic recurrence. 2. No evidence of local recurrence in the pelvis. 3. No free fluid in the abdomen pelvis.    07/14/2021 Imaging   CT chest abdomen pelvis with contrast showed 1. No evidence of new or progressive disease within the chest,abdomen, or pelvis. 2. Stable borderline enlarged mediastinal and right hilar lymph nodes, nonspecific and possibly reactive. Recommend attention on follow-up imaging. 3. Moderate volume of formed stool throughout the colon suggestive of constipation. 4. Stable 1.4 cm left adrenal nodule previously characterized as an adenoma on PET-CT February 25, 2020. 5.  Aortic Atherosclerosis (ICD10-I70.0).   08/12/2021 Imaging   CT chest abdomen pelvis wo contrast  1. No evidence of new metastatic disease. 2. Left adrenal adenoma. 3.  Aortic atherosclerosis (ICD10-I70.0). 4.  Emphysema (ICD10-J43.9).   11/24/2021 Imaging   CT chest abdomen pelvis with contrast 1. No new or progressive findings in the chest,  abdomen or pelvis.2. Scattered small perisplenic nodules are unchanged potentially small splenules. 3. Stable LEFT adrenal lesion, unchanged dating back to October of 2017. This likely represents an adenoma. 4. Post hysterectomy.5. Aortic atherosclerosis.   04/07/2022 Imaging   CT chest abdomen pelvis  without contrast 1. Status post hysterectomy, oophorectomy, and omentectomy. No noncontrast evidence of recurrent or metastatic disease in the chest, abdomen, or pelvis. 2. Multiple small nodules adjacent to the spleen in the left upper quadrant, measuring up to 0.9 x 0.7 cm. These are again most likely small accessory splenules, not previously FDG avid. Attention on follow-up. 3. Minimal emphysema, diffuse bilateral bronchial wall thickening, and background of very fine centrilobular nodules, most concentrated in the lung apices, consistent with smoking-related respiratory bronchiolitis.  Aortic Atherosclerosis (ICD10-I70.0) and Emphysema (ICD10-J43.9).     Interval History: Linda Schmitt was poorly tolerant of the Lyrica and stopped the drug.  It made her very loopy and sleepy.  Even at the lower dose, it was ineffective for her.  She describes same symptom burden as prior, no improvement.  She uses a vicks rub at night which she feels helps somewhat.  H+P (11/20/21) Patient presents today to review neuropathic symptoms.  She describes >1 year history of "sand paper feeling", "tingling", "some burning" affecting her feet (bottoms and top) and a little in her fingertips.  Onset was following taxol chemotherapy for ovarian carcinoma.  Symptoms are continuous, they have not improved over the year interval.  She is concerned about the neuropathy interfering with her planned cruise this summer.  Previously she did not tolerate gabapentin (sedation) or pamelor (ineffective).  Medications: Current Outpatient Medications on File Prior to Visit  Medication Sig Dispense Refill   cholecalciferol (VITAMIN D3) 10 MCG (400 UNIT) TABS tablet Take 800 Units by mouth daily.     cyanocobalamin 1000 MCG tablet Take 400 mcg by mouth daily.     olaparib (LYNPARZA) 150 MG tablet Take 2 tablets (300 mg total) by mouth 2 (two) times daily. May take with food to decrease nausea and vomiting. 120 tablet 2    pravastatin (PRAVACHOL) 40 MG tablet TAKE 1 TABLET BY MOUTH EVERY DAY IN THE EVENING 90 tablet 3   pregabalin (LYRICA) 100 MG capsule Take 1 capsule (100 mg total) by mouth 2 (two) times daily. (Patient not taking: Reported on 06/23/2022) 60 capsule 2   No current facility-administered medications on file prior to visit.    Allergies: No Known Allergies Past Medical History:  Past Medical History:  Diagnosis Date   Cancer ([place])    ovarian cancer   Family history of breast cancer    Genetic testing 04/09/2020   No pathogenic variants identified on the Myriad MyRisk (germline) panel. The report date is 04/07/2020.   The [facility] gene panel offered by [employer] includes sequencing and deletion/duplication testing of the following 35 genes: APC, ATM, AXIN2, BARD1, BMPR1A, BRCA1, BRCA2, BRIP1, CHD1, CDK4, CDKN2A, CHEK2, EPCAM (large rearrangement only), HOXB13, GALNT12, MLH1, MSH2, MSH3, MSH6   HLD (hyperlipidemia)    Past Surgical History:  Past Surgical History:  Procedure Laterality Date   CARPAL TUNNEL RELEASE Left    COLONOSCOPY WITH PROPOFOL N/A 12/10/2020   Procedure: COLONOSCOPY WITH PROPOFOL;  Surgeon: Virgel Manifold, MD;  Location: [facility] ENDOSCOPY;  Service: Endoscopy;  Laterality: N/A;   ROBOTIC ASSISTED TOTAL HYSTERECTOMY WITH BILATERAL SALPINGO OOPHERECTOMY  06/05/2020   with omentectomy tumor debulking via mini-laparotomy/hand assisted port   SHOULDER ARTHROSCOPY WITH SUBACROMIAL DECOMPRESSION AND  OPEN ROTATOR C Right 02/01/2020   Procedure: RIGHT SHOULDER ARTHROSCOPIC SUBSCAPULARIS REPAIR, ROTATOR CUFF REPAIR, SUABCROMIAL DECOMPRESSION, AND BICEPS TENODESIS.;  Surgeon: Leim Fabry, MD;  Location: [facility] ORS;  Service: Orthopedics;  Laterality: Right;   TUBAL LIGATION     Social History:  Social History   Socioeconomic History   Marital status: Single    Spouse name: Not on file   Number of children: Not on file   Years of education: Not on file   Highest  education level: Not on file  Occupational History   Not on file  Tobacco Use   Smoking status: Former    Packs/day: 0.75    Years: 40.00    Total pack years: 30.00    Types: Cigarettes    Quit date: 02/06/2018    Years since quitting: 4.5   Smokeless tobacco: Never   Tobacco comments:    quit 02/2018  Vaping Use   Vaping Use: Never used  Substance and Sexual Activity   Alcohol use: Yes    Comment: 2-3 glasses of wine per week,none last 24hrs   Drug use: No   Sexual activity: Not on file  Other Topics Concern   Not on file  Social History Narrative   Lives in [place] alone. Work - [employer]   Diet: Regular   Exercise: walking   Social Determinants of Radio broadcast assistant Strain: Low Risk  (11/27/2021)   Overall Financial Resource Strain (CARDIA)    Difficulty of Paying Living Expenses: Not hard at all  Food Insecurity: No Food Insecurity (11/27/2021)   Hunger Vital Sign    Worried About Running Out of Food in the Last Year: Never true    Ran Out of Food in the Last Year: Never true  Transportation Needs: No Transportation Needs (11/27/2021)   PRAPARE - Hydrologist (Medical): No    Lack of Transportation (Non-Medical): No  Physical Activity: Not on file  Stress: No Stress Concern Present (11/27/2021)   [place]    Feeling of Stress : Not at all  Social Connections: Unknown (11/27/2021)   Social Connection and Isolation Panel [NHANES]    Frequency of Communication with Friends and Family: More than three times a week    Frequency of Social Gatherings with Friends and Family: More than three times a week    Attends Religious Services: Not on file    Active Member of Clubs or Organizations: Not on file    Attends Archivist Meetings: Not on file    Marital Status: Not on file  Intimate Partner Violence: Not At Risk (11/27/2021)   Humiliation, Afraid, Rape, and  Kick questionnaire    Fear of Current or Ex-Partner: No    Emotionally Abused: No    Physically Abused: No    Sexually Abused: No   Family History:  Family History  Problem Relation Age of Onset   Cancer Mother        lymphoma   Stroke Sister    Cancer Brother        lung   Cancer Sister        lung   Lung cancer Brother    Bone cancer Sister    Breast cancer Cousin 52       maternal   Cancer Maternal Aunt        unk types    Review of Systems: Constitutional: Doesn't report fevers, chills or  abnormal weight loss Eyes: Doesn't report blurriness of vision Ears, nose, mouth, throat, and face: Doesn't report sore throat Respiratory: Doesn't report cough, dyspnea or wheezes Cardiovascular: Doesn't report palpitation, chest discomfort  Gastrointestinal:  Doesn't report nausea, constipation, diarrhea GU: Doesn't report incontinence Skin: Doesn't report skin rashes Neurological: Per HPI Musculoskeletal: Doesn't report joint pain Behavioral/Psych: Doesn't report anxiety  Physical Exam: Vitals:   08/27/22 1031  BP: 126/74  Pulse: 82  Resp: 19  Temp: 97.6 F (36.4 C)  SpO2: 99%   KPS: 90. General: Alert, cooperative, pleasant, in no acute distress Head: Normal EENT: No conjunctival injection or scleral icterus.  Lungs: Resp effort normal Cardiac: Regular rate Abdomen: Non-distended abdomen Skin: No rashes cyanosis or petechiae. Extremities: No clubbing or edema  Neurologic Exam: Mental Status: Awake, alert, attentive to examiner. Oriented to self and environment. Language is fluent with intact comprehension.  Cranial Nerves: Visual acuity is grossly normal. Visual fields are full. Extra-ocular movements intact. No ptosis. Face is symmetric Motor: Tone and bulk are normal. Power is full in both arms and legs. Reflexes are symmetric, no pathologic reflexes present.  Sensory: Stocking neuropathic changes Gait: Normal.   Labs: I have reviewed the data as listed     Component Value Date/Time   NA 137 08/16/2022 0954   K 3.6 08/16/2022 0954   CL 106 08/16/2022 0954   CO2 25 08/16/2022 0954   GLUCOSE 122 (H) 08/16/2022 0954   BUN 14 08/16/2022 0954   CREATININE 1.13 (H) 08/16/2022 0954   CALCIUM 9.1 08/16/2022 0954   PROT 7.0 08/16/2022 0954   ALBUMIN 4.3 08/16/2022 0954   AST 21 08/16/2022 0954   ALT 15 08/16/2022 0954   ALKPHOS 54 08/16/2022 0954   BILITOT 1.0 08/16/2022 0954   GFRNONAA 53 (L) 08/16/2022 0954   GFRAA >60 05/06/2020 0801   Lab Results  Component Value Date   WBC 5.1 08/16/2022   NEUTROABS 3.2 08/16/2022   HGB 12.4 08/16/2022   HCT 36.7 08/16/2022   MCV 101.9 (H) 08/16/2022   PLT 136 (L) 08/16/2022    Assessment/Plan Chemotherapy-induced neuropathy ([place])  Linda Schmitt presents with clinical syndrome consistent with symmetric, length dependent, small and large fiber peripheral neuropathy.  Etiology is exposure to taxol chemotherapy, primarily in 2021.  We recommended trial of Cymbalta '30mg'$  daily.  We also discussed resuming gabapentin '300mg'$ , but dosing in the evening given predilection of nocturnal symptoms.   She is agreeable with this plan.  We appreciate the opportunity to participate in the care of Linda Schmitt.  We will touch base with her via phone in 2=3 months for further med titration.  All questions were answered. The patient knows to call the clinic with any problems, questions or concerns. No barriers to learning were detected.  The total time spent in the encounter was 30 minutes and more than 50% was on counseling and review of test results   Ventura Sellers, MD Medical Director of Neuro-Oncology [facility] [place] 08/27/22 10:48 AM

## 2022-09-15 ENCOUNTER — Inpatient Hospital Stay: Payer: Medicare HMO

## 2022-09-20 DIAGNOSIS — H524 Presbyopia: Secondary | ICD-10-CM | POA: Diagnosis not present

## 2022-09-20 DIAGNOSIS — Z0100 Encounter for examination of eyes and vision without abnormal findings: Secondary | ICD-10-CM | POA: Diagnosis not present

## 2022-09-29 ENCOUNTER — Inpatient Hospital Stay: Payer: Medicare HMO | Attending: Oncology

## 2022-09-29 ENCOUNTER — Encounter: Payer: Self-pay | Admitting: Oncology

## 2022-09-29 ENCOUNTER — Inpatient Hospital Stay (HOSPITAL_BASED_OUTPATIENT_CLINIC_OR_DEPARTMENT_OTHER): Payer: Medicare HMO | Admitting: Oncology

## 2022-09-29 ENCOUNTER — Telehealth: Payer: Self-pay | Admitting: Family

## 2022-09-29 VITALS — BP 118/80 | HR 60 | Temp 96.1°F | Wt 218.1 lb

## 2022-09-29 DIAGNOSIS — Z1501 Genetic susceptibility to malignant neoplasm of breast: Secondary | ICD-10-CM | POA: Diagnosis not present

## 2022-09-29 DIAGNOSIS — Z1502 Genetic susceptibility to malignant neoplasm of ovary: Secondary | ICD-10-CM | POA: Diagnosis not present

## 2022-09-29 DIAGNOSIS — Z90710 Acquired absence of both cervix and uterus: Secondary | ICD-10-CM | POA: Insufficient documentation

## 2022-09-29 DIAGNOSIS — Z90722 Acquired absence of ovaries, bilateral: Secondary | ICD-10-CM | POA: Insufficient documentation

## 2022-09-29 DIAGNOSIS — G629 Polyneuropathy, unspecified: Secondary | ICD-10-CM

## 2022-09-29 DIAGNOSIS — Z148 Genetic carrier of other disease: Secondary | ICD-10-CM | POA: Diagnosis not present

## 2022-09-29 DIAGNOSIS — Z5111 Encounter for antineoplastic chemotherapy: Secondary | ICD-10-CM | POA: Diagnosis not present

## 2022-09-29 DIAGNOSIS — N1831 Chronic kidney disease, stage 3a: Secondary | ICD-10-CM

## 2022-09-29 DIAGNOSIS — J910 Malignant pleural effusion: Secondary | ICD-10-CM | POA: Diagnosis not present

## 2022-09-29 DIAGNOSIS — N183 Chronic kidney disease, stage 3 unspecified: Secondary | ICD-10-CM | POA: Diagnosis not present

## 2022-09-29 DIAGNOSIS — C5700 Malignant neoplasm of unspecified fallopian tube: Secondary | ICD-10-CM

## 2022-09-29 DIAGNOSIS — Z79899 Other long term (current) drug therapy: Secondary | ICD-10-CM | POA: Insufficient documentation

## 2022-09-29 DIAGNOSIS — Z1509 Genetic susceptibility to other malignant neoplasm: Secondary | ICD-10-CM | POA: Diagnosis not present

## 2022-09-29 DIAGNOSIS — Z9221 Personal history of antineoplastic chemotherapy: Secondary | ICD-10-CM | POA: Diagnosis not present

## 2022-09-29 DIAGNOSIS — Z9079 Acquired absence of other genital organ(s): Secondary | ICD-10-CM | POA: Diagnosis not present

## 2022-09-29 LAB — CBC WITH DIFFERENTIAL/PLATELET
Abs Immature Granulocytes: 0.01 10*3/uL (ref 0.00–0.07)
Basophils Absolute: 0 10*3/uL (ref 0.0–0.1)
Basophils Relative: 1 %
Eosinophils Absolute: 0.1 10*3/uL (ref 0.0–0.5)
Eosinophils Relative: 2 %
HCT: 32.7 % — ABNORMAL LOW (ref 36.0–46.0)
Hemoglobin: 10.6 g/dL — ABNORMAL LOW (ref 12.0–15.0)
Immature Granulocytes: 0 %
Lymphocytes Relative: 35 %
Lymphs Abs: 1.4 10*3/uL (ref 0.7–4.0)
MCH: 33.7 pg (ref 26.0–34.0)
MCHC: 32.4 g/dL (ref 30.0–36.0)
MCV: 103.8 fL — ABNORMAL HIGH (ref 80.0–100.0)
Monocytes Absolute: 0.3 10*3/uL (ref 0.1–1.0)
Monocytes Relative: 8 %
Neutro Abs: 2.2 10*3/uL (ref 1.7–7.7)
Neutrophils Relative %: 54 %
Platelets: 171 10*3/uL (ref 150–400)
RBC: 3.15 MIL/uL — ABNORMAL LOW (ref 3.87–5.11)
RDW: 14.6 % (ref 11.5–15.5)
WBC: 4 10*3/uL (ref 4.0–10.5)
nRBC: 0 % (ref 0.0–0.2)

## 2022-09-29 LAB — COMPREHENSIVE METABOLIC PANEL
ALT: 12 U/L (ref 0–44)
AST: 16 U/L (ref 15–41)
Albumin: 4 g/dL (ref 3.5–5.0)
Alkaline Phosphatase: 44 U/L (ref 38–126)
Anion gap: 8 (ref 5–15)
BUN: 18 mg/dL (ref 8–23)
CO2: 25 mmol/L (ref 22–32)
Calcium: 8.8 mg/dL — ABNORMAL LOW (ref 8.9–10.3)
Chloride: 105 mmol/L (ref 98–111)
Creatinine, Ser: 1.15 mg/dL — ABNORMAL HIGH (ref 0.44–1.00)
GFR, Estimated: 52 mL/min — ABNORMAL LOW (ref >60)
Glucose, Bld: 105 mg/dL — ABNORMAL HIGH (ref 70–99)
Potassium: 3.8 mmol/L (ref 3.5–5.1)
Sodium: 138 mmol/L (ref 135–145)
Total Bilirubin: 0.5 mg/dL (ref 0.3–1.2)
Total Protein: 6.8 g/dL (ref 6.5–8.1)

## 2022-09-29 NOTE — Assessment & Plan Note (Signed)
She did not tolerate Lyrica to 100 mg twice daily.   Now on gabapentin 379m QHS and Cymbalta 374mdaily.  Continue follow up with neurology

## 2022-09-29 NOTE — Assessment & Plan Note (Signed)
Continue current chemotherapy treatments.

## 2022-09-29 NOTE — Progress Notes (Signed)
Hematology/Oncology Progress note [phone] [phone]   REASON FOR VISIT Follow up for High grade serous adenocarcinoma.    ASSESSMENT & PLAN:   Cancer Staging  Carcinoma of fallopian tube [facility]) Staging form: Ovary, Fallopian Tube, and Primary Peritoneal Carcinoma, AJCC 8th Edition - Pathologic stage from 02/18/2020: FIGO Stage IVA (pM1a) - Signed by Earlie Server, MD on 08/12/2020   Carcinoma of fallopian tube ([place]) + pleural effusion cytology  -( somatic BRCA1 positive, HRD positive). S/p 4 cycles of neoadjuvant chemotherapy  Carboplatin /Taxol  S/p total hysterectomy with salpingo-oophorectomy and omentectomy. Optimal (R<1) interval tumor debulking, -followed by 3 cycles of adjuvant carboplatin/Taxol. Labs reviewed and discussed with patient.  CA125 is stable. Continue current regimen olaparib 300 mg twice daily. CT in Dec 2023 showed no recurrence- repeat in March/April 2024  CKD (chronic kidney disease) stage 3, GFR 30-59 ml/min (HCC) Encourage oral hydration and avoid nephrotoxins.     Encounter for antineoplastic chemotherapy Continue current chemotherapy treatments.  Neuropathy She did not tolerate Lyrica to 100 mg twice daily.   Now on gabapentin 368m QHS and Cymbalta 324mdaily.  Continue follow up with neurology  Hypocalcemia Recommend calcium and vitamin D supplementation.    Orders Placed This Encounter  Procedures   CBC with Differential ([facility])    Standing Status:   Future    Standing Expiration Date:   09/30/2023   CA 125    Standing Status:   Future    Standing Expiration Date:   09/30/2023   CMP ([facility])    Standing Status:   Future    Standing Expiration Date:   09/30/2023   Follow up in 6 weeks  All questions were answered. The patient knows to call the clinic with any problems, questions or concerns.  ZhEarlie ServerMD,  PhD [facility] Oncology 09/29/2022    PERTINENT ONCOLOGY HISTORY CaMORGON BALLEZAs a [id number].o.afemale who has above oncology history reviewed by me presents for follow up of carcinoma of fallopian tube   Oncology History  Carcinoma of fallopian tube ([place] 02/18/2020 Cancer Staging    -status post thoracentesis x2 during her recent admission. Cytology was positive for metastatic carcinoma.  TTF-1 Napsin A, GATA3, CDX2 were negative. Positive for PAX 8, which is most often expressing tumors of gynecological and renal origin Staging form: Ovary, Fallopian Tube, and Primary Peritoneal Carcinoma, AJCC 8th Edition - Pathologic stage from 02/18/2020: FIGO Stage IVA (pM1a) - Signed by YuEarlie ServerMD on 08/12/2020   02/25/2020 Imaging   PET scan initial staging 1. Exam shows evidence of peritoneal disease within the abdomen including FDG avid soft tissue infiltration into the omentum.  2. Large, loculated left pleural effusion with thin, FDG avid rind of soft tissue overlying the left lung. This corresponds to known, pathology proven malignant pleural effusion. 3. Small right pleural effusion with mild FDG uptake is equivocal for malignant effusion of the right hemithorax. 4. Fluid density structure within right side of pelvis abutting the right-side of the uterus is noted. This is indeterminate. Further investigation with pelvic sonogram may be helpful.   03/04/2020 - 05/06/2020 Chemotherapy   4 cycles of neoadjuvant carbo and Taxol   04/24/2020 Imaging   CT chest abdomen pelvis with contrast showed 1. Moderate left malignant pleural effusion, decreased in size compared to the prior examination. 2. Omental caking highly concerning for intraperitoneal metastatic disease. 3. No definite primary malignancy confidently identified on today's examination.  4. Stable nodule in the lateral limb of the left adrenal gland dating back to at least 2017, presumably a benign lesions such as an adrenal  adenoma.5. Aortic atherosclerosis.6. Additional incidental findings, as above.     05/21/2020 Imaging   CT chest with contrast 1. Small left pleural effusion similar to prior CT. Overall no significant interval change compared to the prior CT. 2. Stable left adrenal nodule. 3. Aortic Atherosclerosis (ICD10-I70.0).   06/05/2020 Procedure    status post Total hysterectomy and bilateral salpingo-oophorectomy, Omentectomy, and Optimal (R<1) interval tumor debulking. A. Peritoneal nodule, excisional biopsy: Fibroadipose tissue with calcifications, negative for tumor. B. Uterus, bilateral ovaries and fallopian tubes, hysterectomy and bilateral salpingo-oophorectomy: Cervix: Squamous metaplasia. Endometrium: Inactive endometrium. Myometrium: Adenomyosis. Serosa: High grade serous adenocarcinoma. Right ovary: High grade serous adenocarcinoma, involving the ovarian surface and parenchyma. Right fallopian tube: No specific pathologic change. Left ovary: High grade serous adenocarcinoma, involving the ovarian surface and parenchyma. Left fallopian tube: High grade serous adenocarcinoma, involving tubal fimbria. Serous tubal intraepithelial carcinoma. Immunohistochemistry was performed on block B14 at [facility] to characterize the pathologic process and demonstrates the following immunophenotype in the cells of interest:POSITIVE:  p53, p16 (strong, patchy), Ki67 (elevated) This staining profile supports the above diagnosis. C. Peritoneal nodule, left pericolic gutter, excisional biopsy: High grade serous adenocarcinoma. D. Cul-de-sac, excisional biopsy: High grade serous adenocarcinoma. E. Omentum, excision: High grade serous adenocarcinoma (up to 3.0 cm).   TUMOR   Tumor Site:    Left fallopian tube   Histologic Type:    Serous carcinoma   Histologic Grade:    High grade   Tumor Size:    Greatest Dimension (Centimeters): 0.2 cm   Ovarian Surface Involvement:    Present     Laterality:     Bilateral   Fallopian Tube Surface Involvement:    Present     Laterality:    Left   Implants:    Present (sites): left peritoneum, cul-de-sac, omentum   Other Tissue / Organ Involvement:    Right ovary   Other Tissue / Organ Involvement:    Left ovary   Other Tissue / Organ Involvement:    Left fallopian tube   Other Tissue / Organ Involvement:    Pelvic peritoneum   Other Tissue / Organ Involvement:    Omentum   Largest Extrapelvic Peritoneal Focus:    Macroscopic (greater than 2 cm)   Peritoneal / Ascitic Fluid:    Not submitted / unknown   Pleural Fluid:    Not submitted / unknown   Treatment Effect:    Moderate response identified (CRS 2)   LYMPH NODES   Regional Lymph Nodes:    No lymph nodes submitted or found   PATHOLOGIC STAGE CLASSIFICATION (pTNM, AJCC 8th Edition)   TNM Descriptors:    y (post-treatment)   Primary Tumor (pT):    pT3c   Regional Lymph Nodes (pN):    pNX  FIGO Stage:    IIIC   + pleural effusion so STAGE IV No germline BRCA mutation, -Homologous recombination deficiency positive.  Myriad Genomic instability score:  positive, somatic BRCA1 positive   06/25/2020 Initial Diagnosis   Carcinoma of fallopian tube ([place])   07/01/2020 - 08/12/2020 Chemotherapy   3 cycles of adjuvant chemotherapy with carboplatin AUC 5, Taxol 175 mg/m.     09/02/2020 -  Chemotherapy   started on olaparib 300 mg twice daily.   11/16/2020 Imaging   CT chest abdomen pelvis with contrast showed 1. Status  post interval hysterectomy and bilateral salpingectomy. 2. Status post interval omentectomy. There is no residual suspicious peritoneal nodularity identified. Unchanged small nodules adjacent to the spleen which are stable compared to examinations dating back to 2017 and likely small splenules and/or lymph nodes. Attention on follow-up 3. Interval resolution of a previously seen moderate left pleural effusion. There is minimal residual pleural thickening. 4. Findings are consistent  with treatment response.5. No evidence of new metastatic disease in the chest, abdomen, or pelvis.   Aortic Atherosclerosis (ICD10-I70.0).   03/18/2021 Imaging   CT chest abdomen pelvis with contrast showed Chest Impression: 1. No evidence metastatic disease in the thorax. 2. Stable borderline enlarged mediastinal lymph nodes and RIGHT hilar node. Recommend attention on follow-up. Abdomen / Pelvis Impression: 1. No evidence of peritoneal or omental nodal metastatic recurrence. 2. No evidence of local recurrence in the pelvis. 3. No free fluid in the abdomen pelvis.    07/14/2021 Imaging   CT chest abdomen pelvis with contrast showed 1. No evidence of new or progressive disease within the chest,abdomen, or pelvis. 2. Stable borderline enlarged mediastinal and right hilar lymph nodes, nonspecific and possibly reactive. Recommend attention on follow-up imaging. 3. Moderate volume of formed stool throughout the colon suggestive of constipation. 4. Stable 1.4 cm left adrenal nodule previously characterized as an adenoma on PET-CT February 25, 2020. 5.  Aortic Atherosclerosis (ICD10-I70.0).   08/12/2021 Imaging   CT chest abdomen pelvis wo contrast  1. No evidence of new metastatic disease. 2. Left adrenal adenoma. 3.  Aortic atherosclerosis (ICD10-I70.0). 4.  Emphysema (ICD10-J43.9).   11/24/2021 Imaging   CT chest abdomen pelvis with contrast 1. No new or progressive findings in the chest, abdomen or pelvis.2. Scattered small perisplenic nodules are unchanged potentially small splenules. 3. Stable LEFT adrenal lesion, unchanged dating back to October of 2017. This likely represents an adenoma. 4. Post hysterectomy.5. Aortic atherosclerosis.   04/07/2022 Imaging   CT chest abdomen pelvis without contrast 1. Status post hysterectomy, oophorectomy, and omentectomy. No noncontrast evidence of recurrent or metastatic disease in the chest, abdomen, or pelvis. 2. Multiple small nodules adjacent to  the spleen in the left upper quadrant, measuring up to 0.9 x 0.7 cm. These are again most likely small accessory splenules, not previously FDG avid. Attention on follow-up. 3. Minimal emphysema, diffuse bilateral bronchial wall thickening, and background of very fine centrilobular nodules, most concentrated in the lung apices, consistent with smoking-related respiratory bronchiolitis.  Aortic Atherosclerosis (ICD10-I70.0) and Emphysema (ICD10-J43.9).      INTERVAL HISTORY 67 year old female presents for follow-up of stage IV ovarian cancer,  somatic BRCA1 positive and HRD positive.  Patient has been on olaparib since January 2022.   Overall patient tolerates well denies any nausea vomiting diarrhea.  She feels well.   + Neuropathy symptoms, follows up with neurology No new complaints.  Review of Systems  Constitutional:  Negative for appetite change, chills, fatigue and fever.  HENT:   Negative for hearing loss and voice change.   Eyes:  Negative for eye problems.  Respiratory:  Negative for chest tightness and cough.   Cardiovascular:  Negative for chest pain.  Gastrointestinal:  Negative for abdominal distention, abdominal pain and blood in stool.  Endocrine: Negative for hot flashes.  Genitourinary:  Negative for difficulty urinating and frequency.   Musculoskeletal:  Negative for arthralgias.  Skin:  Negative for itching and rash.  Neurological:  Positive for numbness. Negative for extremity weakness.  Hematological:  Negative for adenopathy.  Psychiatric/Behavioral:  Positive for sleep disturbance. Negative for confusion.         No Known Allergies   Past Medical History:  Diagnosis Date   Cancer ([place])    ovarian cancer   Family history of breast cancer    Genetic testing 04/09/2020   No pathogenic variants identified on the Myriad MyRisk (germline) panel. The report date is 04/07/2020.   The [facility] gene panel offered by [employer] includes sequencing and  deletion/duplication testing of the following 35 genes: APC, ATM, AXIN2, BARD1, BMPR1A, BRCA1, BRCA2, BRIP1, CHD1, CDK4, CDKN2A, CHEK2, EPCAM (large rearrangement only), HOXB13, GALNT12, MLH1, MSH2, MSH3, MSH6   HLD (hyperlipidemia)      Past Surgical History:  Procedure Laterality Date   CARPAL TUNNEL RELEASE Left    COLONOSCOPY WITH PROPOFOL N/A 12/10/2020   Procedure: COLONOSCOPY WITH PROPOFOL;  Surgeon: Virgel Manifold, MD;  Location: [facility] ENDOSCOPY;  Service: Endoscopy;  Laterality: N/A;   ROBOTIC ASSISTED TOTAL HYSTERECTOMY WITH BILATERAL SALPINGO OOPHERECTOMY  06/05/2020   with omentectomy tumor debulking via mini-laparotomy/hand assisted port   SHOULDER ARTHROSCOPY WITH SUBACROMIAL DECOMPRESSION AND OPEN ROTATOR C Right 02/01/2020   Procedure: RIGHT SHOULDER ARTHROSCOPIC SUBSCAPULARIS REPAIR, ROTATOR CUFF REPAIR, SUABCROMIAL DECOMPRESSION, AND BICEPS TENODESIS.;  Surgeon: Leim Fabry, MD;  Location: [facility] ORS;  Service: Orthopedics;  Laterality: Right;   TUBAL LIGATION      Social History   Socioeconomic History   Marital status: Single    Spouse name: Not on file   Number of children: Not on file   Years of education: Not on file   Highest education level: Not on file  Occupational History   Not on file  Tobacco Use   Smoking status: Former    Packs/day: 0.75    Years: 40.00    Total pack years: 30.00    Types: Cigarettes    Quit date: 02/06/2018    Years since quitting: 4.6   Smokeless tobacco: Never   Tobacco comments:    quit 02/2018  Vaping Use   Vaping Use: Never used  Substance and Sexual Activity   Alcohol use: Yes    Comment: 2-3 glasses of wine per week,none last 24hrs   Drug use: No   Sexual activity: Not on file  Other Topics Concern   Not on file  Social History Narrative   Lives in [place] alone. Work - [employer]   Diet: Regular   Exercise: walking   Social Determinants of Radio broadcast assistant Strain: Low Risk  (11/27/2021)   Overall  Financial Resource Strain (CARDIA)    Difficulty of Paying Living Expenses: Not hard at all  Food Insecurity: No Food Insecurity (11/27/2021)   Hunger Vital Sign    Worried About Running Out of Food in the Last Year: Never true    Ran Out of Food in the Last Year: Never true  Transportation Needs: No Transportation Needs (11/27/2021)   PRAPARE - Hydrologist (Medical): No    Lack of Transportation (Non-Medical): No  Physical Activity: Not on file  Stress: No Stress Concern Present (11/27/2021)   [place]    Feeling of Stress : Not at all  Social Connections: Unknown (11/27/2021)   Social Connection and Isolation Panel [NHANES]    Frequency of Communication with Friends and Family: More than three times a week    Frequency of Social Gatherings with Friends and Family: More than three times a week  Attends Religious Services: Not on file    Active Member of Clubs or Organizations: Not on file    Attends Club or Organization Meetings: Not on file    Marital Status: Not on file  Intimate Partner Violence: Not At Risk (11/27/2021)   Humiliation, Afraid, Rape, and Kick questionnaire    Fear of Current or Ex-Partner: No    Emotionally Abused: No    Physically Abused: No    Sexually Abused: No    Family History  Problem Relation Age of Onset   Cancer Mother        lymphoma   Stroke Sister    Cancer Brother        lung   Cancer Sister        lung   Lung cancer Brother    Bone cancer Sister    Breast cancer Cousin 30       maternal   Cancer Maternal Aunt        unk types     Current Outpatient Medications:    cholecalciferol (VITAMIN D3) 10 MCG (400 UNIT) TABS tablet, Take 800 Units by mouth daily., Disp: , Rfl:    cyanocobalamin 1000 MCG tablet, Take 400 mcg by mouth daily., Disp: , Rfl:    DULoxetine (CYMBALTA) 30 MG capsule, Take 1 capsule (30 mg total) by mouth daily., Disp: 60  capsule, Rfl: 1   gabapentin (NEURONTIN) 300 MG capsule, Take 1 capsule (300 mg total) by mouth at bedtime., Disp: 60 capsule, Rfl: 2   olaparib (LYNPARZA) 150 MG tablet, Take 2 tablets (300 mg total) by mouth 2 (two) times daily. May take with food to decrease nausea and vomiting., Disp: 120 tablet, Rfl: 2   pravastatin (PRAVACHOL) 40 MG tablet, TAKE 1 TABLET BY MOUTH EVERY DAY IN THE EVENING, Disp: 90 tablet, Rfl: 3   pregabalin (LYRICA) 100 MG capsule, Take 1 capsule (100 mg total) by mouth 2 (two) times daily. (Patient not taking: Reported on 06/23/2022), Disp: 60 capsule, Rfl: 2  Physical exam:  Vitals:   09/29/22 1020  BP: 118/80  Pulse: 60  Temp: (!) 96.1 F (35.6 C)  TempSrc: Tympanic  SpO2: 100%  Weight: 218 lb 1.6 oz (98.9 kg)   Physical Exam Constitutional:      General: She is not in acute distress.    Appearance: She is not diaphoretic.  HENT:     Head: Normocephalic and atraumatic.     Nose: Nose normal.     Mouth/Throat:     Pharynx: No oropharyngeal exudate.  Eyes:     General: No scleral icterus.    Pupils: Pupils are equal, round, and reactive to light.  Cardiovascular:     Rate and Rhythm: Normal rate and regular rhythm.     Heart sounds: No murmur heard. Pulmonary:     Effort: Pulmonary effort is normal. No respiratory distress.     Breath sounds: No rales.  Chest:     Chest wall: No tenderness.  Abdominal:     General: There is no distension.     Palpations: Abdomen is soft.     Tenderness: There is no abdominal tenderness.  Musculoskeletal:        General: Normal range of motion.     Cervical back: Normal range of motion and neck supple.  Skin:    General: Skin is warm and dry.     Findings: No erythema.  Neurological:     Mental Status: She is alert and oriented to  person, place, and time.     Cranial Nerves: No cranial nerve deficit.     Motor: No abnormal muscle tone.     Coordination: Coordination normal.  Psychiatric:        Mood and  Affect: Affect normal.     Labs are reviewed and discussed with patient.    Latest Ref Rng & Units 09/29/2022    9:56 AM 08/16/2022    9:54 AM 06/23/2022    9:56 AM  CBC  WBC 4.0 - 10.5 K/uL 4.0  5.1  4.4   Hemoglobin 12.0 - 15.0 g/dL 10.6  12.4  11.6   Hematocrit 36.0 - 46.0 % 32.7  36.7  35.0   Platelets 150 - 400 K/uL 171  136  121       Latest Ref Rng & Units 09/29/2022    9:56 AM 08/16/2022    9:54 AM 06/23/2022    9:56 AM  CMP  Glucose 70 - 99 mg/dL 105  122  129   BUN 8 - 23 mg/dL 18  14  12   $ Creatinine 0.44 - 1.00 mg/dL 1.15  1.13  1.09   Sodium 135 - 145 mmol/L 138  137  136   Potassium 3.5 - 5.1 mmol/L 3.8  3.6  3.2   Chloride 98 - 111 mmol/L 105  106  105   CO2 22 - 32 mmol/L 25  25  23   $ Calcium 8.9 - 10.3 mg/dL 8.8  9.1  9.0   Total Protein 6.5 - 8.1 g/dL 6.8  7.0  7.1   Total Bilirubin 0.3 - 1.2 mg/dL 0.5  1.0  0.5   Alkaline Phos 38 - 126 U/L 44  54  47   AST 15 - 41 U/L 16  21  21   $ ALT 0 - 44 U/L 12  15  14    $ RADIOGRAPHIC STUDIES: I have personally reviewed the radiological images as listed and agreed with the findings in the report. CT CHEST ABDOMEN PELVIS WO CONTRAST  Result Date: 08/12/2022 CLINICAL DATA:  Ovarian cancer.  * Tracking Code: BO * EXAM: CT CHEST, ABDOMEN AND PELVIS WITHOUT CONTRAST TECHNIQUE: Multidetector CT imaging of the chest, abdomen and pelvis was performed following the standard protocol without IV contrast. RADIATION DOSE REDUCTION: This exam was performed according to the departmental dose-optimization program which includes automated exposure control, adjustment of the mA and/or kV according to patient size and/or use of iterative reconstruction technique. COMPARISON:  04/07/2022. FINDINGS: CT CHEST FINDINGS Cardiovascular: Heart is at the upper limits of normal in size to mildly enlarged. No pericardial effusion. Mediastinum/Nodes: No pathologically enlarged mediastinal or axillary lymph nodes. Hilar regions are difficult to definitively  evaluate without IV contrast. Esophagus is grossly unremarkable. Lungs/Pleura: Centrilobular emphysema. Calcified granulomas. No pleural fluid. Airway is unremarkable. Musculoskeletal: Degenerative changes in the spine. No worrisome lytic or sclerotic lesions. CT ABDOMEN PELVIS FINDINGS Hepatobiliary: Liver and gallbladder are unremarkable. No biliary ductal dilatation. Pancreas: Negative. Spleen: Probable small splenules in the left upper quadrant. Adrenals/Urinary Tract: 1.6 cm left adrenal nodule measures 28 Hounsfield units but is stable from PET 02/25/2020, at which time it was not hypermetabolic. No specific follow-up necessary. Kidneys are unremarkable. Ureters are decompressed. Bladder is grossly unremarkable. Stomach/Bowel: Stomach, small bowel, appendix and colon are unremarkable. Vascular/Lymphatic: Atherosclerotic calcification of the aorta. No pathologically enlarged lymph nodes. Reproductive: No adnexal mass. Other: 5 mm nodule in the left mid abdominal peritoneum (2/61), chronically stable. No new peritoneal nodules. Mesenteries  and peritoneum are otherwise unremarkable. No free fluid. Musculoskeletal: Degenerative changes in the spine. Old L1 superior endplate compression fracture. IMPRESSION: 1. No evidence of new metastatic disease. 2. Left adrenal adenoma. 3.  Aortic atherosclerosis (ICD10-I70.0). 4.  Emphysema (ICD10-J43.9). Electronically Signed   By: Lorin Picket M.D.   On: 08/12/2022 14:46

## 2022-09-29 NOTE — Telephone Encounter (Signed)
Pt not due for mammo until 11/10/22 Can be set up with order / appt after visit on 3/1

## 2022-09-29 NOTE — Assessment & Plan Note (Signed)
Encourage oral hydration and avoid nephrotoxins.   

## 2022-09-29 NOTE — Assessment & Plan Note (Signed)
Recommend calcium and vitamin D supplementation.

## 2022-09-29 NOTE — Assessment & Plan Note (Addendum)
+   pleural effusion cytology  -( somatic BRCA1 positive, HRD positive). S/p 4 cycles of neoadjuvant chemotherapy  Carboplatin /Taxol  S/p total hysterectomy with salpingo-oophorectomy and omentectomy. Optimal (R<1) interval tumor debulking, -followed by 3 cycles of adjuvant carboplatin/Taxol. Labs reviewed and discussed with patient.  CA125 is stable. Continue current regimen olaparib 300 mg twice daily. CT in Dec 2023 showed no recurrence- repeat in March/April 2024

## 2022-09-29 NOTE — Telephone Encounter (Signed)
Pt called back and I read the message to her and she verbalized understanding 

## 2022-09-29 NOTE — Telephone Encounter (Signed)
Pt called asking for an appt with Arnett, I booked for 3/1, however, during the call, she mentioned that she also needs a mammogram appt that she is due for one.?

## 2022-10-01 LAB — CA 125: Cancer Antigen (CA) 125: 4.9 U/mL (ref 0.0–38.1)

## 2022-10-06 ENCOUNTER — Inpatient Hospital Stay (HOSPITAL_BASED_OUTPATIENT_CLINIC_OR_DEPARTMENT_OTHER): Payer: Medicare HMO | Admitting: Obstetrics and Gynecology

## 2022-10-06 VITALS — BP 129/82 | HR 86 | Temp 97.9°F | Resp 20 | Wt 213.6 lb

## 2022-10-06 DIAGNOSIS — Z90710 Acquired absence of both cervix and uterus: Secondary | ICD-10-CM | POA: Diagnosis not present

## 2022-10-06 DIAGNOSIS — Z1502 Genetic susceptibility to malignant neoplasm of ovary: Secondary | ICD-10-CM | POA: Diagnosis not present

## 2022-10-06 DIAGNOSIS — Z1509 Genetic susceptibility to other malignant neoplasm: Secondary | ICD-10-CM | POA: Diagnosis not present

## 2022-10-06 DIAGNOSIS — Z79899 Other long term (current) drug therapy: Secondary | ICD-10-CM | POA: Diagnosis not present

## 2022-10-06 DIAGNOSIS — Z9221 Personal history of antineoplastic chemotherapy: Secondary | ICD-10-CM | POA: Diagnosis not present

## 2022-10-06 DIAGNOSIS — Z9079 Acquired absence of other genital organ(s): Secondary | ICD-10-CM | POA: Diagnosis not present

## 2022-10-06 DIAGNOSIS — J910 Malignant pleural effusion: Secondary | ICD-10-CM | POA: Diagnosis not present

## 2022-10-06 DIAGNOSIS — C5700 Malignant neoplasm of unspecified fallopian tube: Secondary | ICD-10-CM

## 2022-10-06 DIAGNOSIS — Z148 Genetic carrier of other disease: Secondary | ICD-10-CM | POA: Diagnosis not present

## 2022-10-06 DIAGNOSIS — Z1501 Genetic susceptibility to malignant neoplasm of breast: Secondary | ICD-10-CM | POA: Diagnosis not present

## 2022-10-06 DIAGNOSIS — C763 Malignant neoplasm of pelvis: Secondary | ICD-10-CM

## 2022-10-06 DIAGNOSIS — Z90722 Acquired absence of ovaries, bilateral: Secondary | ICD-10-CM | POA: Diagnosis not present

## 2022-10-06 DIAGNOSIS — N183 Chronic kidney disease, stage 3 unspecified: Secondary | ICD-10-CM | POA: Diagnosis not present

## 2022-10-06 NOTE — Progress Notes (Signed)
Gynecologic Oncology Interval Visit  [facility]  [phone] [phone]  Patient Care Team: Burnard Hawthorne, FNP as PCP - General (Family Medicine) Clent Jacks, RN as Oncology Nurse Navigator Earlie Server, MD as Consulting Physician (Oncology) Mellody Drown, MD as Referring Physician (Gynecologic Oncology) Gillis Ends, MD as Referring Physician (Gynecologic Oncology) Virgel Manifold, MD (Inactive) as Consulting Physician (Gastroenterology)   Name of the patient: Linda Schmitt  [id number]  June 23, 1956   Date of visit: 10/06/2022  Chief Complaint: 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 Tasia Catchings  Subjective:  Linda Schmitt is a 67 y.o. G1P1 female, initially seen in consultation from Dr. 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   She had imaging on 08/12/22 with Dr Tasia Catchings- CT C/A/P wo contrast that was negative for recurrent or metastatic disease. She continues to tolerate olaparib well without significant side effects. CA 125 has been followed by DR Tasia Catchings and remains low. She continues to feel well and denies complaints.   Component Ref Range & Units 7 d ago 1 mo ago 3 mo ago 4 mo ago 6 mo ago 7 mo ago 8 mo ago  Cancer Antigen (CA) 125 0.0 - 38.1 U/mL 4.9 4.1 CM 3.7 CM 3.6 CM 3.6 CM 4.1 CM 4.8 CM     Gynecologic Oncology History:  Linda Schmitt is a 67 y.o. female, initially seen in consultation from Dr. 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 Her history is as follows:  She presented with malignant pleural effusion two weeks after elective right shoulder surgery. She underwent thoracentesis on 02/18/2020 and  02/19/2020 with 1.8L and 1.2L removed respectively. Cytology was positive for PAX 8. PET scan showed evidence of peritoneal disease within the abdomen, soft tissue infiltrating into the omentum, large loculated left pleural effusion with thin FDG avid rind of soft tissue overlying the left lung. Small right pleural effusion with mild FDG uptake is equivocal for malignant effusion of the right hemithorax. Fluid density structure within right side of pelvis abutting the right sided uterus noted though indeterminate. Pelvic u/s obtained showed heterogeneous myometrium with poor definition of endometrial complex. Normal left ovary.  No cystic right adnexal lesion is identified.  No a probable small normal-appearing right ovary is identified.  Suspect that the low-attenuation presumed cystic structure seen in the right adnexa on the prior image corresponds to a small amount of nonspecific free fluid in the right adnexa.  Biopsy of omental mass on 03/05/20  DIAGNOSIS:  A. OMENTAL MASS; CT-GUIDED BIOPSY:  - HIGH-GRADE SEROUS CARCINOMA.   Given that cytology from left pleural effusion is positive for metastatic carcinoma, tumor cells are positive for PAX 8 which indicates GYN origin.  Given that patient has moderate to large amount of pleural effusion, heavy tumor burden, recommend to start chemotherapy as soon as possible with carboplatin AUC 5-6 and paclitaxel 175 mg/m every 3 weeks for 3 cycles.  Patient will establish care with gynecology oncology for evaluation of participating in clinical trials after neoadjuvant chemo/surgery. CA 125 was 53.6 at time of diagnosis.   Treatment Summary: 03/04/20 Cycle 1 carboplatin-paclitaxel CA 125 53.6 03/25/20 Cycle 2 carboplatin-paclitaxel CA 125 39.9 04/15/20  Cycle 3 carboplatin-paclitaxel CA 125 10.5  04/25/20 CT  C/A/P revealed moderate left malignant pleural effusion with some pleural thickening and enhancement which was decreased in size compared to prior exam. Omental  caking apparent concerning for intraperitoneal metastatic disease. No definite primary malignancy identified. Stable left adrenal nodule. Aortic atherosclerosis.   05/06/2020 Cycle 4 carboplatin-paclitaxel CA125 5.8  On 06/05/2020 she underwent exam under anesthesia, Diagnostic laparoscopy, Robot-assisted total laparoscopic hysterectomy, Bilateral salpingo-oophorectomy, Omentectomy via mini-laparotomy/hand assisted port, and Optimal (R<1) interval tumor debulking. Pathology c/w tubal origin.   Pathology: High grade serous adenocarcinoma involving bilateral ovaries and fallopian tubes; peritoneal nodules, cul-de-sac and omentum (primary site left tube). p53, p16 (strong, patchy), Ki67 (elevated)  11/22/2021Cycle #5 paclitaxel and carboplatin CA125 4.6 07/22/2020 Cycle #6 paclitaxel and carboplatin  CA125 4.16 August 2020 olaparib maintenance initiated  10/29/2020 Patient complaining of pelvic pain/pressure as well as constipation at [facility] visit today. CTA ordered and completed on 11/14/2020. Included below.  IMPRESSION: 1. Status post interval hysterectomy and bilateral salpingectomy. 2. Status post interval omentectomy. There is no residual suspicious peritoneal nodularity identified. Unchanged small nodules adjacent to the spleen which are stable compared to examinations dating back to 2017 and likely small splenules and/or lymph nodes. Attention on follow-up  3. Interval resolution of a previously seen moderate left pleural effusion. There is minimal residual pleural thickening.  4. Findings are consistent with treatment response. 5. No evidence of new metastatic disease in the chest, abdomen, or pelvis.  03/18/2021, CT showed no obvious cancer metastasis or recurrence. There are borderline enlarged mediastinal lymph node and a right hilar node.  Attention on follow-up   07/14/2021 CT chest abdomen pelvis with contrast showed no evidence of recurrent or metastatic disease.  Borderline  enlarged mediastinal lymph node and hilar lymphadenopathy, attention on follow-up.  Stable  Follow up CT 04/07/22 for restaging with Dr Tasia Catchings. No evidence of recurrent or metastatic disease. Multiple adjacent nodules to spleen, thought to be small accessory splenules, not previously FDG avid. Minimal emphysema. Aortic atherosclerosis and emphysema.   CA 125 has been followed since diagnosis 02/26/2020  53.6 (high)  03/25/2020  39.9  04/15/2020  10.5 (first normal) 05/06/2020  5.8  06/30/2020  4.6 07/22/2020  4.8 08/12/2020  4.3 09/02/2020  4.3 09/23/2020  4.7  10/21/2020  6.1 11/18/2020  6.9 12/16/2020  4.7 01/20/2021  3.4 02/16/2021  4.3 03/16/2021  4.9 05/04/2021  3.9 06/01/2021  3.8  07/13/2021  4.1 08/31/2021  4.7 10/13/2021  5.2 05/11/22 3.6 09/29/22 4.9  GENETIC TEST RESULTS:   Germline Testing: 04/07/20- negative. No clinically significant mutation identified. Breast Cancer Riskscore remaining life time risk- 7%.   Somatic Testing: Genetic testing reported out on 04/08/2020 through the [facility] HRD. No pathogenic variants identified on the [facility] (germline) panel. Pathogenic variant identified through [employer] (HRD testing) in BRCA1 called [id number], HRD positive.    The [facility] gene panel offered by [employer] includes sequencing and deletion/duplication testing of the following 35 genes: APC, ATM, AXIN2, BARD1, BMPR1A, BRCA1, BRCA2, BRIP1, CHD1, CDK4, CDKN2A, CHEK2, EPCAM (large rearrangement only), HOXB13, GALNT12, MLH1, MSH2, MSH3, MSH6, MUTYH, NBN, NTHL1, PALB2, PMS2, PTEN, RAD51C, RAD51D, RNF43, RPS20, SMAD4, STK11, and TP53. Sequencing was performed for select regions of POLE and POLD1, and large rearrangement analysis was performed for select regions of GREM1.   Health Maintenance Last Screening Mammogram 11/09/2020. BI-RADS CATEGORY  1: Negative. Repeat screening mammogram in 1 year.   Last Colonoscopy completed 12/10/2020.    Problem List: Patient Active Problem List  Diagnosis Date Noted   Hypocalcemia 09/29/2022   Neuropathy 04/09/2022   CKD (chronic kidney disease) stage 3, GFR 30-59 ml/min (HCC) 03/08/2022   Hypokalemia 01/28/2022   Osteopenia 11/18/2021   Screen for colon cancer    Polyp of colon    Normocytic anemia 06/30/2020   Carcinoma of fallopian tube ([place]) 06/25/2020   BRCA1 gene mutation positive 04/11/2020   Genetic testing 04/09/2020   Serous carcinoma of female pelvis ([place]) 03/20/2020   Family history of breast cancer    Encounter for antineoplastic chemotherapy 03/04/2020   Chemotherapy-induced neuropathy ([place]) 03/04/2020   Mass of omentum 02/28/2020   Goals of care, counseling/discussion 02/26/2020   Metastatic carcinoma ([place]) 02/26/2020   Status post thoracentesis    Malignant pleural effusion    Pleural effusion 02/17/2020   Mass of arm, right 01/22/2020   Elevated blood pressure reading 12/28/2019   Hand tingling 12/28/2019   Acute pain of right shoulder 12/01/2018   Adrenal adenoma, left 11/11/2017   Aortic atherosclerosis ([place]) 09/23/2017   Prediabetes 07/09/2017   Ganglion of right wrist 07/06/2017   Hyperlipidemia 10/11/2016   Former smoker 09/21/2016   Depression, recurrent ([place]) 09/14/2016   Headache 09/14/2016   Routine general medical examination at a health care facility 01/14/2015   Screening for breast cancer 11/12/2013    Past Medical History: Past Medical History:  Diagnosis Date   Cancer [facility])    ovarian cancer   Family history of breast cancer    Genetic testing 04/09/2020   No pathogenic variants identified on the [employer] [facility] (germline) panel. The report date is 04/07/2020.   The [facility] gene panel offered by [employer] includes sequencing and deletion/duplication testing of the following 35 genes: APC, ATM, AXIN2, BARD1, BMPR1A, BRCA1, BRCA2, BRIP1, CHD1, CDK4, CDKN2A, CHEK2, EPCAM (large rearrangement only), HOXB13, GALNT12, MLH1,  MSH2, MSH3, MSH6   HLD (hyperlipidemia)     Past Surgical History: Past Surgical History:  Procedure Laterality Date   CARPAL TUNNEL RELEASE Left    COLONOSCOPY WITH PROPOFOL N/A 12/10/2020   Procedure: COLONOSCOPY WITH PROPOFOL;  Surgeon: Virgel Manifold, MD;  Location: [facility] ENDOSCOPY;  Service: Endoscopy;  Laterality: N/A;   ROBOTIC ASSISTED TOTAL HYSTERECTOMY WITH BILATERAL SALPINGO OOPHERECTOMY  06/05/2020   with omentectomy tumor debulking via mini-laparotomy/hand assisted port   SHOULDER ARTHROSCOPY WITH SUBACROMIAL DECOMPRESSION AND OPEN ROTATOR C Right 02/01/2020   Procedure: RIGHT SHOULDER ARTHROSCOPIC SUBSCAPULARIS REPAIR, ROTATOR CUFF REPAIR, SUABCROMIAL DECOMPRESSION, AND BICEPS TENODESIS.;  Surgeon: Leim Fabry, MD;  Location: [facility] ORS;  Service: Orthopedics;  Laterality: Right;   TUBAL LIGATION      Past Gynecologic History: as per HPI  OB History:  OB History  Gravida Para Term Preterm AB Living  '1 1       1  '$ SAB IAB Ectopic Multiple Live Births               # Outcome Date GA Lbr Len/2nd Weight Sex Delivery Anes PTL Lv  1 Para             Family History: Family History  Problem Relation Age of Onset   Cancer Mother        lymphoma   Stroke Sister    Cancer Brother        lung   Cancer Sister        lung   Lung cancer Brother    Bone cancer Sister    Breast cancer Cousin 36  maternal   Cancer Maternal Aunt        unk types  Family history significant for lung cancer, lymphoma, and breast cancer.   Social History: Social History   Socioeconomic History   Marital status: Single    Spouse name: Not on file   Number of children: Not on file   Years of education: Not on file   Highest education level: Not on file  Occupational History   Not on file  Tobacco Use   Smoking status: Former    Packs/day: 0.75    Years: 40.00    Total pack years: 30.00    Types: Cigarettes    Quit date: 02/06/2018    Years since quitting: 4.6   Smokeless  tobacco: Never   Tobacco comments:    quit 02/2018  Vaping Use   Vaping Use: Never used  Substance and Sexual Activity   Alcohol use: Yes    Comment: 2-3 glasses of wine per week,none last 24hrs   Drug use: No   Sexual activity: Not on file  Other Topics Concern   Not on file  Social History Narrative   Lives in [place] alone. Work - [employer]   Diet: Regular   Exercise: walking   Social Determinants of Radio broadcast assistant Strain: Low Risk  (11/27/2021)   Overall Financial Resource Strain (CARDIA)    Difficulty of Paying Living Expenses: Not hard at all  Food Insecurity: No Food Insecurity (11/27/2021)   Hunger Vital Sign    Worried About Running Out of Food in the Last Year: Never true    Ran Out of Food in the Last Year: Never true  Transportation Needs: No Transportation Needs (11/27/2021)   PRAPARE - Hydrologist (Medical): No    Lack of Transportation (Non-Medical): No  Physical Activity: Not on file  Stress: No Stress Concern Present (11/27/2021)   [place]    Feeling of Stress : Not at all  Social Connections: Unknown (11/27/2021)   Social Connection and Isolation Panel [NHANES]    Frequency of Communication with Friends and Family: More than three times a week    Frequency of Social Gatherings with Friends and Family: More than three times a week    Attends Religious Services: Not on file    Active Member of [place] or Organizations: Not on file    Attends Archivist Meetings: Not on file    Marital Status: Not on file  Intimate Partner Violence: Not At Risk (11/27/2021)   Humiliation, Afraid, Rape, and Kick questionnaire    Fear of Current or Ex-Partner: No    Emotionally Abused: No    Physically Abused: No    Sexually Abused: No   Immunization History  Administered Date(s) Administered   PFIZER Comirnaty(Gray Top)Covid-19 Tri-Sucrose Vaccine  05/08/2020   PFIZER(Purple Top)SARS-COV-2 Vaccination 05/08/2020, 05/29/2020   Tdap 12/24/2011   Allergies: No Known Allergies  Current Medications: Current Outpatient Medications  Medication Sig Dispense Refill   cholecalciferol (VITAMIN D3) 10 MCG (400 UNIT) TABS tablet Take 800 Units by mouth daily.     cyanocobalamin 1000 MCG tablet Take 400 mcg by mouth daily.     DULoxetine (CYMBALTA) 30 MG capsule Take 1 capsule (30 mg total) by mouth daily. 60 capsule 1   gabapentin (NEURONTIN) 300 MG capsule Take 1 capsule (300 mg total) by mouth at bedtime. 60 capsule 2   olaparib (LYNPARZA) 150 MG  tablet Take 2 tablets (300 mg total) by mouth 2 (two) times daily. May take with food to decrease nausea and vomiting. 120 tablet 2   pravastatin (PRAVACHOL) 40 MG tablet TAKE 1 TABLET BY MOUTH EVERY DAY IN THE EVENING 90 tablet 3   pregabalin (LYRICA) 100 MG capsule Take 1 capsule (100 mg total) by mouth 2 (two) times daily. (Patient not taking: Reported on 06/23/2022) 60 capsule 2   No current facility-administered medications for this visit.   Immunization History  Administered Date(s) Administered   PFIZER Comirnaty(Gray Top)Covid-19 Tri-Sucrose Vaccine 05/08/2020   PFIZER(Purple Top)SARS-COV-2 Vaccination 05/08/2020, 05/29/2020   Tdap 12/24/2011   Review of Systems General:  no complaints Skin: no complaints Eyes: no complaints HEENT: no complaints Breasts: no complaints Pulmonary: no complaints Cardiac: no complaints Gastrointestinal: no complaints Genitourinary/Sexual: no complaints Ob/Gyn: no complaints Musculoskeletal: no complaints Hematology: no complaints Neurologic/Psych: no complaints  Objective:  Physical Examination:  BP 129/82   Pulse 86   Temp 97.9 F (36.6 C)   Resp 20   Wt 213 lb 9.6 oz (96.9 kg)   SpO2 100%   BMI 36.38 kg/m     ECOG Performance Status: 0 - Asymptomatic  GENERAL: Patient is a well appearing female in no acute distress HEENT:  Sclera  clear. Anicteric NODES:  Negative axillary, supraclavicular, inguinal lymph node survery LUNGS:  Clear to auscultation bilaterally.   HEART:  Regular rate and rhythm.  ABDOMEN:  Soft, nontender.  No hernias, incisions well healed. No masses or ascites EXTREMITIES:  No peripheral edema. Atraumatic. No cyanosis SKIN:  Clear with no obvious rashes or skin changes.  NEURO:  Nonfocal. Well oriented.  Appropriate affect.  Pelvic: Exam chaperoned by NP  EGBUS: no lesions Cervix: surgically absent Vagina: no lesions, no bleeding Uterus: surgically absent BME: no palpable masses Rectovaginal: deferred   Lab Review As per HPI  Radiologic Imaging: Per HPI    Assessment:  Linda Schmitt is a 67 y.o. female diagnosed with stage IV high grade serous fallopian tube cancer based on malignant pleural effusion. CA 125 at diagnosis was 53. Underwent NACT with paclitaxel/carboplatin for 4 cycles and MIS optimal interval debulking on 06/05/2020.  Completed 3 cycles of adjuvant carboplatin-taxol chemotherapy on 08/12/20, currently on maintenance olaparib since January 2022 with Dr Tasia Catchings. NED on exam today.  CA 125 low and normal at 4.   BRCA1 somatic mutation; HRD+  Medical co-morbidities complicating care: There is no height or weight on file to calculate BMI. Plan:   Problem List Items Addressed This Visit       Genitourinary   Carcinoma of fallopian tube ([place]) - Primary (Chronic)     Other   Serous carcinoma of female pelvis ([place])   Tolerating maintenance PARP inhibitor well without significant side effects. Continue per Dr. Tasia Catchings until disease progression or unacceptable toxicity. Continue follow up with Dr. Tasia Catchings as scheduled for monitoring of use of parp inhibitor. Discussed that based on SOLO-1, she would take 2 years of adjuvant parp maintenance but based on PRIMA trial, could consider up to 3 years of parp maintenance. Would be hesitant to consider longer use based on increased risk of secondary  malignancies, including MDS/AML. Will discuss and defer to Dr Tasia Catchings.    RTC in 6 months for follow up in [place] clinic with MD (patient prefers female providers if possible).   Beckey Rutter, DNP, AGNP-C [place] [facility] [phone] (clinic)  I personally interviewed and examined the patient. Agreed  with the above/below plan of care. I have directly contributed to assessment and plan of care of this patient and educated and discussed with patient and family.  Mellody Drown, MD

## 2022-10-08 ENCOUNTER — Encounter: Payer: Self-pay | Admitting: Family

## 2022-10-08 ENCOUNTER — Ambulatory Visit (INDEPENDENT_AMBULATORY_CARE_PROVIDER_SITE_OTHER): Payer: Medicare HMO | Admitting: Family

## 2022-10-08 ENCOUNTER — Telehealth: Payer: Self-pay | Admitting: Family

## 2022-10-08 VITALS — BP 130/84 | HR 73 | Temp 97.8°F | Ht 63.0 in | Wt 217.2 lb

## 2022-10-08 DIAGNOSIS — M8580 Other specified disorders of bone density and structure, unspecified site: Secondary | ICD-10-CM | POA: Diagnosis not present

## 2022-10-08 DIAGNOSIS — T451X5A Adverse effect of antineoplastic and immunosuppressive drugs, initial encounter: Secondary | ICD-10-CM

## 2022-10-08 DIAGNOSIS — Z13220 Encounter for screening for lipoid disorders: Secondary | ICD-10-CM | POA: Diagnosis not present

## 2022-10-08 DIAGNOSIS — G620 Drug-induced polyneuropathy: Secondary | ICD-10-CM | POA: Diagnosis not present

## 2022-10-08 DIAGNOSIS — Z136 Encounter for screening for cardiovascular disorders: Secondary | ICD-10-CM | POA: Diagnosis not present

## 2022-10-08 DIAGNOSIS — R899 Unspecified abnormal finding in specimens from other organs, systems and tissues: Secondary | ICD-10-CM

## 2022-10-08 DIAGNOSIS — Z1231 Encounter for screening mammogram for malignant neoplasm of breast: Secondary | ICD-10-CM | POA: Diagnosis not present

## 2022-10-08 DIAGNOSIS — Z87898 Personal history of other specified conditions: Secondary | ICD-10-CM

## 2022-10-08 NOTE — Progress Notes (Signed)
Assessment & Plan:  Abnormal laboratory test -     Lipid panel; Future -     Hemoglobin A1c; Future -     VITAMIN D 25 Hydroxy (Vit-D Deficiency, Fractures); Future -     PTH, intact and calcium; Future  Encounter for screening mammogram for malignant neoplasm of breast -     3D Screening Mammogram, Left and Right; Future  Osteopenia, unspecified location Assessment & Plan: Discussed recommendation of calcium and advised to decrease '1200mg'$  BID to less than '600mg'$  to avoid calcification of arteries. She will continue vitamin d. Pending labs in a couple of weeks at the cancer center.   Orders: -     VITAMIN D 25 Hydroxy (Vit-D Deficiency, Fractures); Future -     PTH, intact and calcium; Future  Chemotherapy-induced neuropathy (HCC) Assessment & Plan: Chronic, stable. Continue  gabapentin 300 mg nightly and Cymbalta 30 mg daily as prescribed by Dr Mickeal Skinner.    Encounter for lipid screening for cardiovascular disease -     Lipid panel; Future  History of prediabetes -     Hemoglobin A1c; Future     Return precautions given.   Risks, benefits, and alternatives of the medications and treatment plan prescribed today were discussed, and patient expressed understanding.   Education regarding symptom management and diagnosis given to patient on AVS either electronically or printed.  Return for [employer] upcoming or due, schedule.  [name], FNP  Subjective:    Patient ID: Linda Schmitt, female    DOB: 1956-04-29, 67 y.o.   MRN: [id number]  CC: Linda Schmitt is a 67 y.o. female who presents today for follow up.   HPI: Overall feels well today  She had low calcium recently. She is taking calcium '1200mg'$  bid. She also takes vitamin D        Neuropathy from chemotherapy- She did not tolerate Lyrica.  She is compliant with gabapentin 300 mg nightly and Cymbalta 30 mg daily as prescribed by Dr Mickeal Skinner.    She continues to follow with Dr. Fransisca Connors, Dr.  Tasia Catchings for carcinoma of the fallopian tube.  She is currently on maintenance chemotherapy.   Mammogram, colonoscopy and CT lung cancer screening are up-to-date. Allergies: Patient has no known allergies. Current Outpatient Medications on File Prior to Visit  Medication Sig Dispense Refill   cholecalciferol (VITAMIN D3) 10 MCG (400 UNIT) TABS tablet Take 800 Units by mouth daily.     cyanocobalamin 1000 MCG tablet Take 400 mcg by mouth daily.     DULoxetine (CYMBALTA) 30 MG capsule Take 1 capsule (30 mg total) by mouth daily. 60 capsule 1   gabapentin (NEURONTIN) 300 MG capsule Take 1 capsule (300 mg total) by mouth at bedtime. 60 capsule 2   olaparib (LYNPARZA) 150 MG tablet Take 2 tablets (300 mg total) by mouth 2 (two) times daily. May take with food to decrease nausea and vomiting. 120 tablet 2   pravastatin (PRAVACHOL) 40 MG tablet TAKE 1 TABLET BY MOUTH EVERY DAY IN THE EVENING 90 tablet 3   No current facility-administered medications on file prior to visit.    Review of Systems  Constitutional:  Negative for chills and fever.  Respiratory:  Negative for cough.   Cardiovascular:  Negative for chest pain and palpitations.  Gastrointestinal:  Negative for nausea and vomiting.      Objective:    BP 130/84   Pulse 73   Temp 97.8 F (36.6 C) (Oral)   Ht 5'  3" (1.6 m)   Wt 217 lb 3.2 oz (98.5 kg)   SpO2 96%   BMI 38.48 kg/m  BP Readings from Last 3 Encounters:  10/08/22 130/84  10/06/22 129/82  09/29/22 118/80   Wt Readings from Last 3 Encounters:  10/08/22 217 lb 3.2 oz (98.5 kg)  10/06/22 213 lb 9.6 oz (96.9 kg)  09/29/22 218 lb 1.6 oz (98.9 kg)    Physical Exam Vitals reviewed.  Constitutional:      Appearance: She is well-developed.  Eyes:     Conjunctiva/sclera: Conjunctivae normal.  Cardiovascular:     Rate and Rhythm: Normal rate and regular rhythm.     Pulses: Normal pulses.     Heart sounds: Normal heart sounds.  Pulmonary:     Effort: Pulmonary effort is  normal.     Breath sounds: Normal breath sounds. No wheezing, rhonchi or rales.  Skin:    General: Skin is warm and dry.  Neurological:     Mental Status: She is alert.  Psychiatric:        Speech: Speech normal.        Behavior: Behavior normal.        Thought Content: Thought content normal.

## 2022-10-08 NOTE — Assessment & Plan Note (Signed)
Chronic, stable. Continue  gabapentin 300 mg nightly and Cymbalta 30 mg daily as prescribed by Dr Mickeal Skinner.

## 2022-10-08 NOTE — Patient Instructions (Signed)
  For post menopausal women, guidelines recommend a diet with 1200 mg of Calcium per day. If you are eating calcium rich foods, you do not need a calcium supplement. The body better absorbs the calcium that you eat over supplementation. If you do supplement, I recommend not supplementing the full 1200 mg/ day as this can lead to increased risk of cardiovascular disease. I recommend Calcium Citrate over the counter, and you may take a total of 600 to 800 mg per day in divided doses with meals for best absorption.   For bone health, you need adequate vitamin D, and I recommend you supplement as it is harder to do so with diet alone. I recommend cholecalciferol 800 units daily.  Also, please ensure you are following a diet high in calcium -- research shows better outcomes with dietary sources including kale, yogurt, broccolii, cheese, okra, almonds- to name a few.     Also remember that exercise is a great medicine for maintain and preserve bone health. Advise moderate exercise for 30 minutes , 3 times per week.

## 2022-10-08 NOTE — Assessment & Plan Note (Signed)
Discussed recommendation of calcium and advised to decrease '1200mg'$  BID to less than '600mg'$  to avoid calcification of arteries. She will continue vitamin d. Pending labs in a couple of weeks at the cancer center.

## 2022-10-08 NOTE — Telephone Encounter (Signed)
Please sch mammogram

## 2022-10-11 NOTE — Telephone Encounter (Signed)
I called and spoke with the patient and informed her that her mammogram referral was placed and she just needed to call and schedule and she understood.  Linda Schmitt,cma

## 2022-10-18 DIAGNOSIS — G620 Drug-induced polyneuropathy: Secondary | ICD-10-CM | POA: Diagnosis not present

## 2022-10-18 DIAGNOSIS — E785 Hyperlipidemia, unspecified: Secondary | ICD-10-CM | POA: Diagnosis not present

## 2022-10-18 DIAGNOSIS — K5900 Constipation, unspecified: Secondary | ICD-10-CM | POA: Diagnosis not present

## 2022-10-18 DIAGNOSIS — Z8589 Personal history of malignant neoplasm of other organs and systems: Secondary | ICD-10-CM | POA: Diagnosis not present

## 2022-10-18 DIAGNOSIS — E559 Vitamin D deficiency, unspecified: Secondary | ICD-10-CM | POA: Diagnosis not present

## 2022-10-18 DIAGNOSIS — Z6837 Body mass index (BMI) 37.0-37.9, adult: Secondary | ICD-10-CM | POA: Diagnosis not present

## 2022-10-18 DIAGNOSIS — R030 Elevated blood-pressure reading, without diagnosis of hypertension: Secondary | ICD-10-CM | POA: Diagnosis not present

## 2022-10-18 DIAGNOSIS — Z809 Family history of malignant neoplasm, unspecified: Secondary | ICD-10-CM | POA: Diagnosis not present

## 2022-10-18 DIAGNOSIS — I700 Atherosclerosis of aorta: Secondary | ICD-10-CM | POA: Diagnosis not present

## 2022-10-18 DIAGNOSIS — Z87891 Personal history of nicotine dependence: Secondary | ICD-10-CM | POA: Diagnosis not present

## 2022-10-18 DIAGNOSIS — M1990 Unspecified osteoarthritis, unspecified site: Secondary | ICD-10-CM | POA: Diagnosis not present

## 2022-10-21 ENCOUNTER — Other Ambulatory Visit: Payer: Self-pay | Admitting: Internal Medicine

## 2022-10-22 ENCOUNTER — Telehealth: Payer: Medicare HMO | Admitting: Internal Medicine

## 2022-11-05 ENCOUNTER — Inpatient Hospital Stay: Payer: Medicare HMO | Attending: Oncology | Admitting: Internal Medicine

## 2022-11-05 DIAGNOSIS — G620 Drug-induced polyneuropathy: Secondary | ICD-10-CM | POA: Diagnosis not present

## 2022-11-05 DIAGNOSIS — T451X5A Adverse effect of antineoplastic and immunosuppressive drugs, initial encounter: Secondary | ICD-10-CM

## 2022-11-05 NOTE — Progress Notes (Signed)
I connected with [name] on 11/05/22 at  9:45 AM EDT by telephone visit and verified that I am speaking with the correct person using two identifiers.  I discussed the limitations, risks, security and privacy concerns of performing an evaluation and management service by telemedicine and the availability of in-person appointments. I also discussed with the patient that there may be a patient responsible charge related to this service. The patient expressed understanding and agreed to proceed.  Other persons participating in the visit and their role in the encounter:  n/a   Patient's location:  Home Provider's location:  Office Chief Complaint:  Chemotherapy-induced neuropathy ([place])  History of Present Ilness: Linda Schmitt reports improvement in neuropathic pain symptoms since resuming the gabapentin 300mg  at night.  She is also on the cymbalta 30mg  daily.  Still has some tingling but overall improved compared to last visit 2 months ago.  Observations: Language and cognition at baseline  Assessment and Plan: Chemotherapy-induced neuropathy (HCC)  Clinically improved.  Patient prefers to continue current regimen, Gabapentin 300mg  HS and Cymbalta 30mg  daily.  Follow Up Instructions: RTC as needed  I discussed the assessment and treatment plan with the patient.  The patient was provided an opportunity to ask questions and all were answered.  The patient agreed with the plan and demonstrated understanding of the instructions.    The patient was advised to call back or seek an in-person evaluation if the symptoms worsen or if the condition fails to improve as anticipated.    Ventura Sellers, MD   I provided 15 minutes of non face-to-face telephone visit time during this encounter, and > 50% was spent counseling as documented under my assessment & plan.

## 2022-11-12 ENCOUNTER — Ambulatory Visit
Admission: RE | Admit: 2022-11-12 | Discharge: 2022-11-12 | Disposition: A | Discharge status: Home / Self Care | Payer: Medicare HMO | Source: Ambulatory Visit | Attending: Family | Admitting: Family

## 2022-11-12 DIAGNOSIS — Z1231 Encounter for screening mammogram for malignant neoplasm of breast: Secondary | ICD-10-CM | POA: Diagnosis not present

## 2022-11-15 ENCOUNTER — Other Ambulatory Visit: Payer: Self-pay | Admitting: Family

## 2022-11-15 DIAGNOSIS — N63 Unspecified lump in unspecified breast: Secondary | ICD-10-CM

## 2022-11-15 DIAGNOSIS — R928 Other abnormal and inconclusive findings on diagnostic imaging of breast: Secondary | ICD-10-CM

## 2022-11-16 ENCOUNTER — Inpatient Hospital Stay (HOSPITAL_BASED_OUTPATIENT_CLINIC_OR_DEPARTMENT_OTHER): Payer: Medicare HMO | Admitting: Oncology

## 2022-11-16 ENCOUNTER — Inpatient Hospital Stay: Payer: Medicare HMO | Attending: Oncology

## 2022-11-16 ENCOUNTER — Encounter: Payer: Self-pay | Admitting: Oncology

## 2022-11-16 VITALS — BP 141/83 | HR 70 | Temp 96.8°F | Resp 18 | Wt 224.0 lb

## 2022-11-16 DIAGNOSIS — Z801 Family history of malignant neoplasm of trachea, bronchus and lung: Secondary | ICD-10-CM | POA: Insufficient documentation

## 2022-11-16 DIAGNOSIS — C5700 Malignant neoplasm of unspecified fallopian tube: Secondary | ICD-10-CM

## 2022-11-16 DIAGNOSIS — Z808 Family history of malignant neoplasm of other organs or systems: Secondary | ICD-10-CM | POA: Diagnosis not present

## 2022-11-16 DIAGNOSIS — G629 Polyneuropathy, unspecified: Secondary | ICD-10-CM | POA: Diagnosis not present

## 2022-11-16 DIAGNOSIS — N1831 Chronic kidney disease, stage 3a: Secondary | ICD-10-CM | POA: Diagnosis not present

## 2022-11-16 DIAGNOSIS — N183 Chronic kidney disease, stage 3 unspecified: Secondary | ICD-10-CM | POA: Diagnosis not present

## 2022-11-16 DIAGNOSIS — Z5111 Encounter for antineoplastic chemotherapy: Secondary | ICD-10-CM

## 2022-11-16 DIAGNOSIS — J910 Malignant pleural effusion: Secondary | ICD-10-CM | POA: Diagnosis not present

## 2022-11-16 DIAGNOSIS — Z807 Family history of other malignant neoplasms of lymphoid, hematopoietic and related tissues: Secondary | ICD-10-CM | POA: Insufficient documentation

## 2022-11-16 DIAGNOSIS — Z79899 Other long term (current) drug therapy: Secondary | ICD-10-CM | POA: Diagnosis not present

## 2022-11-16 DIAGNOSIS — Z87891 Personal history of nicotine dependence: Secondary | ICD-10-CM | POA: Diagnosis not present

## 2022-11-16 DIAGNOSIS — Z803 Family history of malignant neoplasm of breast: Secondary | ICD-10-CM | POA: Diagnosis not present

## 2022-11-16 LAB — CMP (CANCER CENTER ONLY)
ALT: 12 U/L (ref 0–44)
AST: 17 U/L (ref 15–41)
Albumin: 4 g/dL (ref 3.5–5.0)
Alkaline Phosphatase: 46 U/L (ref 38–126)
Anion gap: 9 (ref 5–15)
BUN: 9 mg/dL (ref 8–23)
CO2: 24 mmol/L (ref 22–32)
Calcium: 8.9 mg/dL (ref 8.9–10.3)
Chloride: 106 mmol/L (ref 98–111)
Creatinine: 1.02 mg/dL — ABNORMAL HIGH (ref 0.44–1.00)
GFR, Estimated: 60 mL/min — ABNORMAL LOW (ref >60)
Glucose, Bld: 121 mg/dL — ABNORMAL HIGH (ref 70–99)
Potassium: 3.4 mmol/L — ABNORMAL LOW (ref 3.5–5.1)
Sodium: 139 mmol/L (ref 135–145)
Total Bilirubin: 0.8 mg/dL (ref 0.3–1.2)
Total Protein: 7 g/dL (ref 6.5–8.1)

## 2022-11-16 LAB — CBC WITH DIFFERENTIAL (CANCER CENTER ONLY)
Abs Immature Granulocytes: 0.02 10*3/uL (ref 0.00–0.07)
Basophils Absolute: 0 10*3/uL (ref 0.0–0.1)
Basophils Relative: 0 %
Eosinophils Absolute: 0.1 10*3/uL (ref 0.0–0.5)
Eosinophils Relative: 1 %
HCT: 34.4 % — ABNORMAL LOW (ref 36.0–46.0)
Hemoglobin: 11.5 g/dL — ABNORMAL LOW (ref 12.0–15.0)
Immature Granulocytes: 1 %
Lymphocytes Relative: 27 %
Lymphs Abs: 1.2 10*3/uL (ref 0.7–4.0)
MCH: 34.4 pg — ABNORMAL HIGH (ref 26.0–34.0)
MCHC: 33.4 g/dL (ref 30.0–36.0)
MCV: 103 fL — ABNORMAL HIGH (ref 80.0–100.0)
Monocytes Absolute: 0.4 10*3/uL (ref 0.1–1.0)
Monocytes Relative: 9 %
Neutro Abs: 2.6 10*3/uL (ref 1.7–7.7)
Neutrophils Relative %: 62 %
Platelet Count: 155 10*3/uL (ref 150–400)
RBC: 3.34 MIL/uL — ABNORMAL LOW (ref 3.87–5.11)
RDW: 15 % (ref 11.5–15.5)
WBC Count: 4.3 10*3/uL (ref 4.0–10.5)
nRBC: 0.5 % — ABNORMAL HIGH (ref 0.0–0.2)

## 2022-11-16 MED ORDER — OLAPARIB 150 MG PO TABS
300.0000 mg | ORAL_TABLET | Freq: Two times a day (BID) | ORAL | 2 refills | Status: DC
Start: 2022-11-16 — End: 2023-04-28

## 2022-11-16 NOTE — Progress Notes (Signed)
Hematology/Oncology Progress note [phone] [phone]   REASON FOR VISIT Follow up for High grade serous adenocarcinoma.    ASSESSMENT & PLAN:   Cancer Staging  Carcinoma of fallopian tube Staging form: Ovary, Fallopian Tube, and Primary Peritoneal Carcinoma, AJCC 8th Edition - Pathologic stage from 02/18/2020: FIGO Stage IVA (pM1a) - Signed by [name], MD on 08/12/2020   Carcinoma of fallopian tube (HCC) + pleural effusion cytology  -( somatic BRCA1 positive, HRD positive). S/p 4 cycles of neoadjuvant chemotherapy  Carboplatin /Taxol  S/p total hysterectomy with salpingo-oophorectomy and omentectomy. Optimal (R<1) interval tumor debulking, -followed by 3 cycles of adjuvant carboplatin/Taxol. Labs reviewed and discussed with patient.  CA125 is stable.  Continue current regimen olaparib 300 mg twice daily.-She has been on treatment for more than 2 years, consider stopping treatment at the next visit if CT result is good.   Encounter for antineoplastic chemotherapy Continue current chemotherapy treatments.  CKD (chronic kidney disease) stage 3, GFR 30-59 ml/min (HCC) Encourage oral hydration and avoid nephrotoxins.     Neuropathy She did not tolerate Lyrica to 100 mg twice daily.   Now on gabapentin 300mg  QHS and Cymbalta 30mg  daily.  Continue follow up with neurology  Hypocalcemia Recommend calcium and vitamin D supplementation.     Orders Placed This Encounter  Procedures   CT CHEST ABDOMEN PELVIS WO CONTRAST    Standing Status:   Future    Standing Expiration Date:   11/16/2023    Scheduling Instructions:     To be scheduled Mid May 2024    Order Specific Question:   If indicated for the ordered procedure, I authorize the administration of contrast media per Radiology protocol    Answer:   Yes    Order Specific Question:   Preferred imaging location?     Answer:   [facility]    Order Specific Question:   Is Oral Contrast requested for this exam?    Answer:   Yes, Per Radiology protocol   CA 125    Standing Status:   Future    Standing Expiration Date:   11/16/2023   CBC with Differential (Cancer Center Only)    Standing Status:   Future    Standing Expiration Date:   11/16/2023   CMP (Cancer Center only)    Standing Status:   Future    Standing Expiration Date:   11/16/2023   Follow up in 2 months  All questions were answered. The patient knows to call the clinic with any problems, questions or concerns.  [name], MD, PhD [facility] Hematology Oncology 11/16/2022    PERTINENT ONCOLOGY HISTORY Linda Schmitt is a 67 y.o.afemale who has above oncology history reviewed by me presents for follow up of carcinoma of fallopian tube   Oncology History  Carcinoma of fallopian tube  02/18/2020 Cancer Staging    -status post thoracentesis x2 during her recent admission. Cytology was positive for metastatic carcinoma.  TTF-1 Napsin A, GATA3, CDX2 were negative. Positive for PAX 8, which is most often expressing tumors of gynecological and renal origin Staging form: Ovary, Fallopian Tube, and Primary Peritoneal Carcinoma, AJCC 8th Edition - Pathologic stage from 02/18/2020: FIGO Stage IVA (pM1a) - Signed by [name], MD on 08/12/2020   02/25/2020 Imaging   PET scan initial staging 1. Exam shows evidence of peritoneal disease within the abdomen including FDG avid soft tissue infiltration into the omentum.  2. Large, loculated left pleural effusion with thin,  FDG avid rind of soft tissue overlying the left lung. This corresponds to known, pathology proven malignant pleural effusion. 3. Small right pleural effusion with mild FDG uptake is equivocal for malignant effusion of the right hemithorax. 4. Fluid density structure within right side of pelvis abutting the right-side of the uterus is noted. This is indeterminate. Further investigation  with pelvic sonogram may be helpful.   03/04/2020 - 05/06/2020 Chemotherapy   4 cycles of neoadjuvant carbo and Taxol   04/24/2020 Imaging   CT chest abdomen pelvis with contrast showed 1. Moderate left malignant pleural effusion, decreased in size compared to the prior examination. 2. Omental caking highly concerning for intraperitoneal metastatic disease. 3. No definite primary malignancy confidently identified on today's examination. 4. Stable nodule in the lateral limb of the left adrenal gland dating back to at least 2017, presumably a benign lesions such as an adrenal adenoma.5. Aortic atherosclerosis.6. Additional incidental findings, as above.     05/21/2020 Imaging   CT chest with contrast 1. Small left pleural effusion similar to prior CT. Overall no significant interval change compared to the prior CT. 2. Stable left adrenal nodule. 3. Aortic Atherosclerosis (ICD10-I70.0).   06/05/2020 Procedure    status post Total hysterectomy and bilateral salpingo-oophorectomy, Omentectomy, and Optimal (R<1) interval tumor debulking. A. Peritoneal nodule, excisional biopsy: Fibroadipose tissue with calcifications, negative for tumor. B. Uterus, bilateral ovaries and fallopian tubes, hysterectomy and bilateral salpingo-oophorectomy: Cervix: Squamous metaplasia. Endometrium: Inactive endometrium. Myometrium: Adenomyosis. Serosa: High grade serous adenocarcinoma. Right ovary: High grade serous adenocarcinoma, involving the ovarian surface and parenchyma. Right fallopian tube: No specific pathologic change. Left ovary: High grade serous adenocarcinoma, involving the ovarian surface and parenchyma. Left fallopian tube: High grade serous adenocarcinoma, involving tubal fimbria. Serous tubal intraepithelial carcinoma. Immunohistochemistry was performed on block B14 at [facility] to characterize the pathologic process and demonstrates the following immunophenotype in the cells of  interest:POSITIVE:  p53, p16 (strong, patchy), Ki67 (elevated) This staining profile supports the above diagnosis. C. Peritoneal nodule, left pericolic gutter, excisional biopsy: High grade serous adenocarcinoma. D. Cul-de-sac, excisional biopsy: High grade serous adenocarcinoma. E. Omentum, excision: High grade serous adenocarcinoma (up to 3.0 cm).   TUMOR   Tumor Site:    Left fallopian tube   Histologic Type:    Serous carcinoma   Histologic Grade:    High grade   Tumor Size:    Greatest Dimension (Centimeters): 0.2 cm   Ovarian Surface Involvement:    Present     Laterality:    Bilateral   Fallopian Tube Surface Involvement:    Present     Laterality:    Left   Implants:    Present (sites): left peritoneum, cul-de-sac, omentum   Other Tissue / Organ Involvement:    Right ovary   Other Tissue / Organ Involvement:    Left ovary   Other Tissue / Organ Involvement:    Left fallopian tube   Other Tissue / Organ Involvement:    Pelvic peritoneum   Other Tissue / Organ Involvement:    Omentum   Largest Extrapelvic Peritoneal Focus:    Macroscopic (greater than 2 cm)   Peritoneal / Ascitic Fluid:    Not submitted / unknown   Pleural Fluid:    Not submitted / unknown   Treatment Effect:    Moderate response identified (CRS 2)   LYMPH NODES   Regional Lymph Nodes:    No lymph nodes submitted or found   PATHOLOGIC STAGE CLASSIFICATION (pTNM, AJCC 8th  Edition)   TNM Descriptors:    y (post-treatment)   Primary Tumor (pT):    pT3c   Regional Lymph Nodes (pN):    pNX  FIGO Stage:    IIIC   + pleural effusion so STAGE IV No germline BRCA mutation, -Homologous recombination deficiency positive.  Myriad Genomic instability score:  positive, somatic BRCA1 positive   06/25/2020 Initial Diagnosis   Carcinoma of fallopian tube (HCC)   07/01/2020 - 08/12/2020 Chemotherapy   3 cycles of adjuvant chemotherapy with carboplatin AUC 5, Taxol 175 mg/m.     09/02/2020 -  Chemotherapy   started  on olaparib 300 mg twice daily.   11/16/2020 Imaging   CT chest abdomen pelvis with contrast showed 1. Status post interval hysterectomy and bilateral salpingectomy. 2. Status post interval omentectomy. There is no residual suspicious peritoneal nodularity identified. Unchanged small nodules adjacent to the spleen which are stable compared to examinations dating back to 2017 and likely small splenules and/or lymph nodes. Attention on follow-up 3. Interval resolution of a previously seen moderate left pleural effusion. There is minimal residual pleural thickening. 4. Findings are consistent with treatment response.5. No evidence of new metastatic disease in the chest, abdomen, or pelvis.   Aortic Atherosclerosis (ICD10-I70.0).   03/18/2021 Imaging   CT chest abdomen pelvis with contrast showed Chest Impression: 1. No evidence metastatic disease in the thorax. 2. Stable borderline enlarged mediastinal lymph nodes and RIGHT hilar node. Recommend attention on follow-up. Abdomen / Pelvis Impression: 1. No evidence of peritoneal or omental nodal metastatic recurrence. 2. No evidence of local recurrence in the pelvis. 3. No free fluid in the abdomen pelvis.    07/14/2021 Imaging   CT chest abdomen pelvis with contrast showed 1. No evidence of new or progressive disease within the chest,abdomen, or pelvis. 2. Stable borderline enlarged mediastinal and right hilar lymph nodes, nonspecific and possibly reactive. Recommend attention on follow-up imaging. 3. Moderate volume of formed stool throughout the colon suggestive of constipation. 4. Stable 1.4 cm left adrenal nodule previously characterized as an adenoma on PET-CT February 25, 2020. 5.  Aortic Atherosclerosis (ICD10-I70.0).   08/12/2021 Imaging   CT chest abdomen pelvis wo contrast  1. No evidence of new metastatic disease. 2. Left adrenal adenoma. 3.  Aortic atherosclerosis (ICD10-I70.0). 4.  Emphysema (ICD10-J43.9).   11/24/2021 Imaging    CT chest abdomen pelvis with contrast 1. No new or progressive findings in the chest, abdomen or pelvis.2. Scattered small perisplenic nodules are unchanged potentially small splenules. 3. Stable LEFT adrenal lesion, unchanged dating back to October of 2017. This likely represents an adenoma. 4. Post hysterectomy.5. Aortic atherosclerosis.   04/07/2022 Imaging   CT chest abdomen pelvis without contrast 1. Status post hysterectomy, oophorectomy, and omentectomy. No noncontrast evidence of recurrent or metastatic disease in the chest, abdomen, or pelvis. 2. Multiple small nodules adjacent to the spleen in the left upper quadrant, measuring up to 0.9 x 0.7 cm. These are again most likely small accessory splenules, not previously FDG avid. Attention on follow-up. 3. Minimal emphysema, diffuse bilateral bronchial wall thickening, and background of very fine centrilobular nodules, most concentrated in the lung apices, consistent with smoking-related respiratory bronchiolitis.  Aortic Atherosclerosis (ICD10-I70.0) and Emphysema (ICD10-J43.9).      INTERVAL HISTORY 67 year old female presents for follow-up of stage IV ovarian cancer,  somatic BRCA1 positive and HRD positive.  Patient has been on olaparib since January 2022.   Overall patient tolerates well denies any nausea vomiting diarrhea.  She feels  well.   + Neuropathy symptoms, follows up with neurology  Review of Systems  Constitutional:  Negative for appetite change, chills, fatigue and fever.  HENT:   Negative for hearing loss and voice change.   Eyes:  Negative for eye problems.  Respiratory:  Negative for chest tightness and cough.   Cardiovascular:  Negative for chest pain.  Gastrointestinal:  Negative for abdominal distention, abdominal pain and blood in stool.  Endocrine: Negative for hot flashes.  Genitourinary:  Negative for difficulty urinating and frequency.   Musculoskeletal:  Negative for arthralgias.  Skin:  Negative for  itching and rash.  Neurological:  Positive for numbness. Negative for extremity weakness.  Hematological:  Negative for adenopathy.  Psychiatric/Behavioral:  Positive for sleep disturbance. Negative for confusion.         No Known Allergies   Past Medical History:  Diagnosis Date   Cancer    ovarian cancer   Family history of breast cancer    Genetic testing 04/09/2020   No pathogenic variants identified on the Myriad MyRisk (germline) panel. The report date is 04/07/2020.   The [facility] gene panel offered by [employer] includes sequencing and deletion/duplication testing of the following 35 genes: APC, ATM, AXIN2, BARD1, BMPR1A, BRCA1, BRCA2, BRIP1, CHD1, CDK4, CDKN2A, CHEK2, EPCAM (large rearrangement only), HOXB13, GALNT12, MLH1, MSH2, MSH3, MSH6   HLD (hyperlipidemia)      Past Surgical History:  Procedure Laterality Date   CARPAL TUNNEL RELEASE Left    COLONOSCOPY WITH PROPOFOL N/A 12/10/2020   Procedure: COLONOSCOPY WITH PROPOFOL;  Surgeon: [name], MD;  Location: [facility] ENDOSCOPY;  Service: Endoscopy;  Laterality: N/A;   ROBOTIC ASSISTED TOTAL HYSTERECTOMY WITH BILATERAL SALPINGO OOPHERECTOMY  06/05/2020   with omentectomy tumor debulking via mini-laparotomy/hand assisted port   SHOULDER ARTHROSCOPY WITH SUBACROMIAL DECOMPRESSION AND OPEN ROTATOR C Right 02/01/2020   Procedure: RIGHT SHOULDER ARTHROSCOPIC SUBSCAPULARIS REPAIR, ROTATOR CUFF REPAIR, SUABCROMIAL DECOMPRESSION, AND BICEPS TENODESIS.;  Surgeon: Signa Kell, MD;  Location: [facility] ORS;  Service: Orthopedics;  Laterality: Right;   TUBAL LIGATION      Social History   Socioeconomic History   Marital status: Single    Spouse name: Not on file   Number of children: Not on file   Years of education: Not on file   Highest education level: Not on file  Occupational History   Not on file  Tobacco Use   Smoking status: Former    Packs/day: 0.75    Years: 40.00    Additional pack years: 0.00     Total pack years: 30.00    Types: Cigarettes    Quit date: 02/06/2018    Years since quitting: 4.7   Smokeless tobacco: Never   Tobacco comments:    quit 02/2018  Vaping Use   Vaping Use: Never used  Substance and Sexual Activity   Alcohol use: Yes    Comment: 2-3 glasses of wine per week,none last 24hrs   Drug use: No   Sexual activity: Not on file  Other Topics Concern   Not on file  Social History Narrative   Lives in [place] alone. Work - [employer]   Diet: Regular   Exercise: walking   Social Determinants of Corporate investment banker Strain: Low Risk  (11/27/2021)   Overall Financial Resource Strain (CARDIA)    Difficulty of Paying Living Expenses: Not hard at all  Food Insecurity: No Food Insecurity (11/27/2021)   Hunger Vital Sign    Worried About Running Out  of Food in the Last Year: Never true    Ran Out of Food in the Last Year: Never true  Transportation Needs: No Transportation Needs (11/27/2021)   PRAPARE - Administrator, Civil ServiceTransportation    Lack of Transportation (Medical): No    Lack of Transportation (Non-Medical): No  Physical Activity: Not on file  Stress: No Stress Concern Present (11/27/2021)   [employer] - Occupational Stress Questionnaire    Feeling of Stress : Not at all  Social Connections: Unknown (11/27/2021)   Social Connection and Isolation Panel [NHANES]    Frequency of Communication with Friends and Family: More than three times a week    Frequency of Social Gatherings with Friends and Family: More than three times a week    Attends Religious Services: Not on file    Active Member of Clubs or Organizations: Not on file    Attends BankerClub or Organization Meetings: Not on file    Marital Status: Not on file  Intimate Partner Violence: Not At Risk (11/27/2021)   Humiliation, Afraid, Rape, and Kick questionnaire    Fear of Current or Ex-Partner: No    Emotionally Abused: No    Physically Abused: No    Sexually Abused: No     Family History  Problem Relation Age of Onset   Cancer Mother        lymphoma   Stroke Sister    Cancer Brother        lung   Cancer Sister        lung   Lung cancer Brother    Bone cancer Sister    Breast cancer Cousin 45       maternal   Cancer Maternal Aunt        unk types     Current Outpatient Medications:    cholecalciferol (VITAMIN D3) 10 MCG (400 UNIT) TABS tablet, Take 800 Units by mouth daily., Disp: , Rfl:    cyanocobalamin 1000 MCG tablet, Take 400 mcg by mouth daily., Disp: , Rfl:    DULoxetine (CYMBALTA) 30 MG capsule, TAKE 1 CAPSULE BY MOUTH EVERY DAY, Disp: 90 capsule, Rfl: 2   gabapentin (NEURONTIN) 300 MG capsule, Take 1 capsule (300 mg total) by mouth at bedtime., Disp: 60 capsule, Rfl: 2   pravastatin (PRAVACHOL) 40 MG tablet, TAKE 1 TABLET BY MOUTH EVERY DAY IN THE EVENING, Disp: 90 tablet, Rfl: 3   olaparib (LYNPARZA) 150 MG tablet, Take 2 tablets (300 mg total) by mouth 2 (two) times daily. May take with food to decrease nausea and vomiting., Disp: 120 tablet, Rfl: 2  Physical exam:  Vitals:   11/16/22 1004  BP: (!) 141/83  Pulse: 70  Resp: 18  Temp: (!) 96.8 F (36 C)  TempSrc: Tympanic  Weight: 224 lb (101.6 kg)   Physical Exam Constitutional:      General: She is not in acute distress.    Appearance: She is not diaphoretic.  HENT:     Head: Normocephalic.  Eyes:     General: No scleral icterus. Cardiovascular:     Rate and Rhythm: Normal rate and regular rhythm.     Heart sounds: No murmur heard. Pulmonary:     Effort: Pulmonary effort is normal. No respiratory distress.     Breath sounds: No wheezing.  Chest:     Chest wall: No tenderness.  Abdominal:     General: Bowel sounds are normal. There is no distension.     Palpations: Abdomen is soft.  Musculoskeletal:        General: Normal range of motion.     Cervical back: Normal range of motion and neck supple.  Skin:    General: Skin is warm and dry.     Findings: No  erythema.  Neurological:     Mental Status: She is alert and oriented to person, place, and time. Mental status is at baseline.     Cranial Nerves: No cranial nerve deficit.     Motor: No abnormal muscle tone.     Coordination: Coordination normal.  Psychiatric:        Mood and Affect: Mood and affect normal.     Labs are reviewed and discussed with patient.    Latest Ref Rng & Units 11/16/2022    9:49 AM 09/29/2022    9:56 AM 08/16/2022    9:54 AM  CBC  WBC 4.0 - 10.5 K/uL 4.3  4.0  5.1   Hemoglobin 12.0 - 15.0 g/dL 16.1  09.6  04.5   Hematocrit 36.0 - 46.0 % 34.4  32.7  36.7   Platelets 150 - 400 K/uL 155  171  136       Latest Ref Rng & Units 11/16/2022    9:49 AM 09/29/2022    9:56 AM 08/16/2022    9:54 AM  CMP  Glucose 70 - 99 mg/dL 409  811  914   BUN 8 - 23 mg/dL 9  18  14    Creatinine 0.44 - 1.00 mg/dL 7.82  9.56  2.13   Sodium 135 - 145 mmol/L 139  138  137   Potassium 3.5 - 5.1 mmol/L 3.4  3.8  3.6   Chloride 98 - 111 mmol/L 106  105  106   CO2 22 - 32 mmol/L 24  25  25    Calcium 8.9 - 10.3 mg/dL 8.9  8.8  9.1   Total Protein 6.5 - 8.1 g/dL 7.0  6.8  7.0   Total Bilirubin 0.3 - 1.2 mg/dL 0.8  0.5  1.0   Alkaline Phos 38 - 126 U/L 46  44  54   AST 15 - 41 U/L 17  16  21    ALT 0 - 44 U/L 12  12  15     RADIOGRAPHIC STUDIES: I have personally reviewed the radiological images as listed and agreed with the findings in the report. MM 3D SCREEN BREAST BILATERAL  Result Date: 11/15/2022 CLINICAL DATA:  Screening. EXAM: DIGITAL SCREENING BILATERAL MAMMOGRAM WITH TOMOSYNTHESIS AND CAD TECHNIQUE: Bilateral screening digital craniocaudal and mediolateral oblique mammograms were obtained. Bilateral screening digital breast tomosynthesis was performed. The images were evaluated with computer-aided detection. COMPARISON:  Previous exam(s). ACR Breast Density Category b: There are scattered areas of fibroglandular density. FINDINGS: In the right breast, a possible mass warrants further  evaluation. In the left breast, no findings suspicious for malignancy. IMPRESSION: Further evaluation is suggested for a possible mass in the right breast. RECOMMENDATION: Diagnostic mammogram and possibly ultrasound of the right breast. (Code:[id number]) The patient will be contacted regarding the findings, and additional imaging will be scheduled. BI-RADS CATEGORY  0: Incomplete: Need additional imaging evaluation. Electronically Signed   By: Frederico Hamman M.D.   On: 11/15/2022 12:45

## 2022-11-16 NOTE — Assessment & Plan Note (Signed)
She did not tolerate Lyrica to 100 mg twice daily.   Now on gabapentin 300mg QHS and Cymbalta 30mg daily.  Continue follow up with neurology 

## 2022-11-16 NOTE — Assessment & Plan Note (Signed)
Continue current chemotherapy treatments. 

## 2022-11-16 NOTE — Assessment & Plan Note (Addendum)
+   pleural effusion cytology  -( somatic BRCA1 positive, HRD positive). S/p 4 cycles of neoadjuvant chemotherapy  Carboplatin /Taxol  S/p total hysterectomy with salpingo-oophorectomy and omentectomy. Optimal (R<1) interval tumor debulking, -followed by 3 cycles of adjuvant carboplatin/Taxol. Labs reviewed and discussed with patient.  CA125 is stable.  Continue current regimen olaparib 300 mg twice daily.-She has been on treatment for more than 2 years, consider stopping treatment at the next visit if CT result is good.

## 2022-11-16 NOTE — Assessment & Plan Note (Signed)
Encourage oral hydration and avoid nephrotoxins.   

## 2022-11-16 NOTE — Assessment & Plan Note (Signed)
Recommend calcium and vitamin D supplementation.  

## 2022-11-17 LAB — CA 125: Cancer Antigen (CA) 125: 4 U/mL (ref 0.0–38.1)

## 2022-11-29 ENCOUNTER — Telehealth: Payer: Self-pay | Admitting: Family

## 2022-11-29 NOTE — Telephone Encounter (Signed)
Contacted Lenox Ahr Hojnacki to schedule their annual wellness visit. Appointment made for 12/03/2022.  Thank you,  [facility] Support [facility] Direct dial  [phone]

## 2022-12-01 ENCOUNTER — Ambulatory Visit
Admission: RE | Admit: 2022-12-01 | Discharge: 2022-12-01 | Disposition: A | Discharge status: Home / Self Care | Payer: Medicare HMO | Source: Ambulatory Visit | Attending: Family | Admitting: Family

## 2022-12-01 DIAGNOSIS — N63 Unspecified lump in unspecified breast: Secondary | ICD-10-CM

## 2022-12-01 DIAGNOSIS — R92323 Mammographic fibroglandular density, bilateral breasts: Secondary | ICD-10-CM | POA: Diagnosis not present

## 2022-12-01 DIAGNOSIS — R928 Other abnormal and inconclusive findings on diagnostic imaging of breast: Secondary | ICD-10-CM

## 2022-12-03 ENCOUNTER — Ambulatory Visit (INDEPENDENT_AMBULATORY_CARE_PROVIDER_SITE_OTHER): Payer: Medicare HMO

## 2022-12-03 VITALS — Ht 63.0 in | Wt 224.0 lb

## 2022-12-03 DIAGNOSIS — Z0000 Encounter for general adult medical examination without abnormal findings: Secondary | ICD-10-CM

## 2022-12-03 NOTE — Patient Instructions (Addendum)
Linda Schmitt , Thank you for taking time to come for your Medicare Wellness Visit. I appreciate your ongoing commitment to your health goals. Please review the following plan we discussed and let me know if I can assist you in the future.   These are the goals we discussed:  Goals       Patient Stated     Reduce sugar intake (pt-stated)      Healthy diet.        This is a list of the screening recommended for you and due dates:  Health Maintenance  Topic Date Due   DTaP/Tdap/Td vaccine (2 - Td or Tdap) 12/23/2021   COVID-19 Vaccine (4 - 2023-24 season) 12/19/2022*   Flu Shot  03/10/2023   Screening for Lung Cancer  08/12/2023   Medicare Annual Wellness Visit  12/03/2023   Mammogram  11/11/2024   Colon Cancer Screening  12/11/2030   DEXA scan (bone density measurement)  Completed   Hepatitis C Screening: USPSTF Recommendation to screen - Ages 76-79 yo.  Completed   HPV Vaccine  Aged Out   Pneumonia Vaccine  Discontinued   Zoster (Shingles) Vaccine  Discontinued  *Topic was postponed. The date shown is not the original due date.    Conditions/risks identified: none new  Next appointment: Follow up in one year for your annual wellness visit    Preventive Care 65 Years and Older, Female Preventive care refers to lifestyle choices and visits with your health care provider that can promote health and wellness. What does preventive care include? A yearly physical exam. This is also called an annual well check. Dental exams once or twice a year. Routine eye exams. Ask your health care provider how often you should have your eyes checked. Personal lifestyle choices, including: Daily care of your teeth and gums. Regular physical activity. Eating a healthy diet. Avoiding tobacco and drug use. Limiting alcohol use. Practicing safe sex. Taking low-dose aspirin every day. Taking vitamin and mineral supplements as recommended by your health care provider. What happens during an  annual well check? The services and screenings done by your health care provider during your annual well check will depend on your age, overall health, lifestyle risk factors, and family history of disease. Counseling  Your health care provider may ask you questions about your: Alcohol use. Tobacco use. Drug use. Emotional well-being. Home and relationship well-being. Sexual activity. Eating habits. History of falls. Memory and ability to understand (cognition). Work and work Astronomer. Reproductive health. Screening  You may have the following tests or measurements: Height, weight, and BMI. Blood pressure. Lipid and cholesterol levels. These may be checked every 5 years, or more frequently if you are over 20 years old. Skin check. Lung cancer screening. You may have this screening every year starting at age 9 if you have a 30-pack-year history of smoking and currently smoke or have quit within the past 15 years. Fecal occult blood test (FOBT) of the stool. You may have this test every year starting at age 66. Flexible sigmoidoscopy or colonoscopy. You may have a sigmoidoscopy every 5 years or a colonoscopy every 10 years starting at age 63. Hepatitis C blood test. Hepatitis B blood test. Sexually transmitted disease (STD) testing. Diabetes screening. This is done by checking your blood sugar (glucose) after you have not eaten for a while (fasting). You may have this done every 1-3 years. Bone density scan. This is done to screen for osteoporosis. You may have this done starting at  age 45. Mammogram. This may be done every 1-2 years. Talk to your health care provider about how often you should have regular mammograms. Talk with your health care provider about your test results, treatment options, and if necessary, the need for more tests. Vaccines  Your health care provider may recommend certain vaccines, such as: Influenza vaccine. This is recommended every year. Tetanus,  diphtheria, and acellular pertussis (Tdap, Td) vaccine. You may need a Td booster every 10 years. Zoster vaccine. You may need this after age 25. Pneumococcal 13-valent conjugate (PCV13) vaccine. One dose is recommended after age 8. Pneumococcal polysaccharide (PPSV23) vaccine. One dose is recommended after age 47. Talk to your health care provider about which screenings and vaccines you need and how often you need them. This information is not intended to replace advice given to you by your health care provider. Make sure you discuss any questions you have with your health care provider. Document Released: 08/22/2015 Document Revised: 04/14/2016 Document Reviewed: 05/27/2015 Elsevier Interactive Patient Education  2017 [employer] Prevention in the Home Falls can cause injuries. They can happen to people of all ages. There are many things you can do to make your home safe and to help prevent falls. What can I do on the outside of my home? Regularly fix the edges of walkways and driveways and fix any cracks. Remove anything that might make you trip as you walk through a door, such as a raised step or threshold. Trim any bushes or trees on the path to your home. Use bright outdoor lighting. Clear any walking paths of anything that might make someone trip, such as rocks or tools. Regularly check to see if handrails are loose or broken. Make sure that both sides of any steps have handrails. Any raised decks and porches should have guardrails on the edges. Have any leaves, snow, or ice cleared regularly. Use sand or salt on walking paths during winter. Clean up any spills in your garage right away. This includes oil or grease spills. What can I do in the bathroom? Use night lights. Install grab bars by the toilet and in the tub and shower. Do not use towel bars as grab bars. Use non-skid mats or decals in the tub or shower. If you need to sit down in the shower, use a plastic,  non-slip stool. Keep the floor dry. Clean up any water that spills on the floor as soon as it happens. Remove soap buildup in the tub or shower regularly. Attach bath mats securely with double-sided non-slip rug tape. Do not have throw rugs and other things on the floor that can make you trip. What can I do in the bedroom? Use night lights. Make sure that you have a light by your bed that is easy to reach. Do not use any sheets or blankets that are too big for your bed. They should not hang down onto the floor. Have a firm chair that has side arms. You can use this for support while you get dressed. Do not have throw rugs and other things on the floor that can make you trip. What can I do in the kitchen? Clean up any spills right away. Avoid walking on wet floors. Keep items that you use a lot in easy-to-reach places. If you need to reach something above you, use a strong step stool that has a grab bar. Keep electrical cords out of the way. Do not use floor polish or wax that makes floors  slippery. If you must use wax, use non-skid floor wax. Do not have throw rugs and other things on the floor that can make you trip. What can I do with my stairs? Do not leave any items on the stairs. Make sure that there are handrails on both sides of the stairs and use them. Fix handrails that are broken or loose. Make sure that handrails are as long as the stairways. Check any carpeting to make sure that it is firmly attached to the stairs. Fix any carpet that is loose or worn. Avoid having throw rugs at the top or bottom of the stairs. If you do have throw rugs, attach them to the floor with carpet tape. Make sure that you have a light switch at the top of the stairs and the bottom of the stairs. If you do not have them, ask someone to add them for you. What else can I do to help prevent falls? Wear shoes that: Do not have high heels. Have rubber bottoms. Are comfortable and fit you well. Are closed  at the toe. Do not wear sandals. If you use a stepladder: Make sure that it is fully opened. Do not climb a closed stepladder. Make sure that both sides of the stepladder are locked into place. Ask someone to hold it for you, if possible. Clearly mark and make sure that you can see: Any grab bars or handrails. First and last steps. Where the edge of each step is. Use tools that help you move around (mobility aids) if they are needed. These include: Canes. Walkers. Scooters. Crutches. Turn on the lights when you go into a dark area. Replace any light bulbs as soon as they burn out. Set up your furniture so you have a clear path. Avoid moving your furniture around. If any of your floors are uneven, fix them. If there are any pets around you, be aware of where they are. Review your medicines with your doctor. Some medicines can make you feel dizzy. This can increase your chance of falling. Ask your doctor what other things that you can do to help prevent falls. This information is not intended to replace advice given to you by your health care provider. Make sure you discuss any questions you have with your health care provider. Document Released: 05/22/2009 Document Revised: 01/01/2016 Document Reviewed: 08/30/2014 Elsevier Interactive Patient Education  2017 [employer].

## 2022-12-03 NOTE — Progress Notes (Signed)
Subjective:   Linda Schmitt is a 67 y.o. female who presents for Medicare Annual (Subsequent) preventive examination.  Review of Systems    No ROS.  Medicare Wellness Virtual Visit.  Visual/audio telehealth visit, UTA vital signs.   See social history for additional risk factors.   Cardiac Risk Factors include: advanced age (>66men, >67 women)     Objective:    Today's Vitals   12/03/22 1519  Weight: 224 lb (101.6 kg)  Height: 5\' 3"  (1.6 m)   Body mass index is 39.68 kg/m.     12/03/2022    3:23 PM 11/16/2022   10:09 AM 08/27/2022   10:32 AM 08/16/2022   10:41 AM 06/23/2022   10:15 AM 06/09/2022   10:02 AM 05/11/2022   10:34 AM  Advanced Directives  Does Patient Have a Medical Advance Directive? No No No No No No No  Does patient want to make changes to medical advance directive? No - Patient declined No - Patient declined No - Patient declined No - Patient declined  No - Patient declined No - Patient declined  Would patient like information on creating a medical advance directive?   No - Patient declined No - Patient declined No - Patient declined No - Patient declined No - Patient declined    Current Medications (verified) Outpatient Encounter Medications as of 12/03/2022  Medication Sig   cholecalciferol (VITAMIN D3) 10 MCG (400 UNIT) TABS tablet Take 800 Units by mouth daily.   cyanocobalamin 1000 MCG tablet Take 400 mcg by mouth daily.   DULoxetine (CYMBALTA) 30 MG capsule TAKE 1 CAPSULE BY MOUTH EVERY DAY   gabapentin (NEURONTIN) 300 MG capsule Take 1 capsule (300 mg total) by mouth at bedtime.   olaparib (LYNPARZA) 150 MG tablet Take 2 tablets (300 mg total) by mouth 2 (two) times daily. May take with food to decrease nausea and vomiting.   pravastatin (PRAVACHOL) 40 MG tablet TAKE 1 TABLET BY MOUTH EVERY DAY IN THE EVENING   No facility-administered encounter medications on file as of 12/03/2022.    Allergies (verified) Patient has no known allergies.    History: Past Medical History:  Diagnosis Date   Cancer (HCC)    ovarian cancer   Family history of breast cancer    Genetic testing 04/09/2020   No pathogenic variants identified on the [facility] MyRisk (germline) panel. The report date is 04/07/2020.   The [facility] gene panel offered by [employer] includes sequencing and deletion/duplication testing of the following 35 genes: APC, ATM, AXIN2, BARD1, BMPR1A, BRCA1, BRCA2, BRIP1, CHD1, CDK4, CDKN2A, CHEK2, EPCAM (large rearrangement only), HOXB13, GALNT12, MLH1, MSH2, MSH3, MSH6   HLD (hyperlipidemia)    Past Surgical History:  Procedure Laterality Date   CARPAL TUNNEL RELEASE Left    COLONOSCOPY WITH PROPOFOL N/A 12/10/2020   Procedure: COLONOSCOPY WITH PROPOFOL;  Surgeon: [name], MD;  Location: [facility] ENDOSCOPY;  Service: Endoscopy;  Laterality: N/A;   ROBOTIC ASSISTED TOTAL HYSTERECTOMY WITH BILATERAL SALPINGO OOPHERECTOMY  06/05/2020   with omentectomy tumor debulking via mini-laparotomy/hand assisted port   SHOULDER ARTHROSCOPY WITH SUBACROMIAL DECOMPRESSION AND OPEN ROTATOR C Right 02/01/2020   Procedure: RIGHT SHOULDER ARTHROSCOPIC SUBSCAPULARIS REPAIR, ROTATOR CUFF REPAIR, SUABCROMIAL DECOMPRESSION, AND BICEPS TENODESIS.;  Surgeon: Signa Kell, MD;  Location: [facility] ORS;  Service: Orthopedics;  Laterality: Right;   TUBAL LIGATION     Family History  Problem Relation Age of Onset   Cancer Mother        lymphoma  Stroke Sister    Cancer Brother        lung   Cancer Sister        lung   Lung cancer Brother    Bone cancer Sister    Breast cancer Cousin 45       maternal   Cancer Maternal Aunt        unk types   Social History   Socioeconomic History   Marital status: Single    Spouse name: Not on file   Number of children: Not on file   Years of education: Not on file   Highest education level: Not on file  Occupational History   Not on file  Tobacco Use   Smoking status: Former     Packs/day: 0.75    Years: 40.00    Additional pack years: 0.00    Total pack years: 30.00    Types: Cigarettes    Quit date: 02/06/2018    Years since quitting: 4.8   Smokeless tobacco: Never   Tobacco comments:    quit 02/2018  Vaping Use   Vaping Use: Never used  Substance and Sexual Activity   Alcohol use: Yes    Comment: 2-3 glasses of wine per week,none last 24hrs   Drug use: No   Sexual activity: Not on file  Other Topics Concern   Not on file  Social History Narrative   Lives in [place] alone. Work - [employer]   Diet: Regular   Exercise: walking   Social Determinants of Corporate investment banker Strain: Low Risk  (12/03/2022)   Overall Financial Resource Strain (CARDIA)    Difficulty of Paying Living Expenses: Not hard at all  Food Insecurity: No Food Insecurity (12/03/2022)   Hunger Vital Sign    Worried About Running Out of Food in the Last Year: Never true    Ran Out of Food in the Last Year: Never true  Transportation Needs: No Transportation Needs (12/03/2022)   PRAPARE - Administrator, Civil Service (Medical): No    Lack of Transportation (Non-Medical): No  Physical Activity: Insufficiently Active (12/03/2022)   Exercise Vital Sign    Days of Exercise per Week: 3 days    Minutes of Exercise per Session: 20 min  Stress: No Stress Concern Present (12/03/2022)   [employer] of Occupational Health - Occupational Stress Questionnaire    Feeling of Stress : Not at all  Social Connections: Unknown (12/03/2022)   Social Connection and Isolation Panel [NHANES]    Frequency of Communication with Friends and Family: More than three times a week    Frequency of Social Gatherings with Friends and Family: More than three times a week    Attends Religious Services: Not on file    Active Member of Clubs or Organizations: Not on file    Attends Banker Meetings: Not on file    Marital Status: Not on file    Tobacco Counseling Counseling  given: Not Answered Tobacco comments: quit 02/2018   Clinical Intake:  Pre-visit preparation completed: Yes        Diabetes: No  How often do you need to have someone help you when you read instructions, pamphlets, or other written materials from your doctor or pharmacy?: 1 - Never    Interpreter Needed?: No      Activities of Daily Living    12/03/2022    3:24 PM  In your present state of health, do you have any difficulty  performing the following activities:  Hearing? 0  Vision? 0  Difficulty concentrating or making decisions? 0  Walking or climbing stairs? 0  Dressing or bathing? 0  Doing errands, shopping? 0  Preparing Food and eating ? N  Using the Toilet? N  In the past six months, have you accidently leaked urine? N  Do you have problems with loss of bowel control? N  Managing your Medications? N  Managing your Finances? N  Housekeeping or managing your Housekeeping? N    Patient Care Team: [name], FNP as PCP - General (Family Medicine) [name], RN as Oncology Nurse Navigator [name], MD as Consulting Physician (Oncology) [name], MD as Referring Physician (Gynecologic Oncology) [name], MD as Referring Physician (Gynecologic Oncology) [name], MD (Inactive) as Consulting Physician (Gastroenterology)  Indicate any recent Medical Services you may have received from other than [facility] providers in the past year (date may be approximate).     Assessment:   This is a routine wellness examination for Linda Schmitt.  I connected with  Linda Schmitt on 12/03/22 by a audio enabled telemedicine application and verified that I am speaking with the correct person using two identifiers.  Patient Location: Home  Provider Location: Office/Clinic  I discussed the limitations of evaluation and management by telemedicine. The patient expressed understanding and agreed to proceed.   Hearing/Vision  screen Hearing Screening - Comments:: Patient is able to hear conversational tones without difficulty.  No issues reported.   Vision Screening - Comments:: Followed by [facility] W[facility] corrective lenses They have seen their ophthalmologist in the last 12 months.    Dietary issues and exercise activities discussed: Current Exercise Habits: Home exercise routine, Type of exercise: stretching, Time (Minutes): 20, Frequency (Times/Week): 3, Weekly Exercise (Minutes/Week): 60, Intensity: Mild Regular diet   Goals Addressed               This Visit's Progress     Patient Stated     Reduce sugar intake (pt-stated)   On track     Healthy diet.       Depression Screen    12/03/2022    3:21 PM 10/08/2022    1:20 PM 11/27/2021    8:38 AM 02/22/2020   11:21 AM 11/21/2019    9:49 AM 10/11/2016    8:32 AM  PHQ 2/9 Scores  PHQ - 2 Score 0 0 0 0 0 0    Fall Risk    12/03/2022    3:24 PM 10/08/2022    1:20 PM 11/27/2021    8:41 AM 02/22/2020   11:21 AM 11/21/2019    9:50 AM  Fall Risk   Falls in the past year? 0 0 0 1 1  Number falls in past yr: 0 0 0 0 0  Comment     Missed a step when walking down the stair.  Injury with Fall? 0 0  1 1  Comment     R shoulder injury; sling currently worn. Sought medical advice with Urgent Care out of town. Followed by [facility].  Risk for fall due to :  No Fall Risks     Follow up Falls evaluation completed;Falls prevention discussed Falls evaluation completed Falls evaluation completed Falls evaluation completed Falls evaluation completed    FALL RISK PREVENTION PERTAINING TO THE HOME: Home free of loose throw rugs in walkways, pet beds, electrical cords, etc? Yes  Adequate lighting in your home to reduce risk of falls?  Yes   ASSISTIVE DEVICES UTILIZED TO PREVENT FALLS: Life alert? No  Use of a cane, walker or w/c? No   TIMED UP AND GO: Was the test performed? No .    Cognitive Function:        12/03/2022    3:32 PM 11/21/2019     9:55 AM  6CIT Screen  What Year? 0 points 0 points  What month? 0 points 0 points  What time? 0 points 0 points  Count back from 20 0 points 0 points  Months in reverse 0 points 0 points  Repeat phrase 0 points 4 points  Total Score 0 points 4 points    Immunizations Immunization History  Administered Date(s) Administered   PFIZER Comirnaty(Gray Top)Covid-19 Tri-Sucrose Vaccine 05/08/2020   PFIZER(Purple Top)SARS-COV-2 Vaccination 05/08/2020, 05/29/2020   Tdap 12/24/2011   TDAP status: Due, Education has been provided regarding the importance of this vaccine. Advised may receive this vaccine at local pharmacy or Health Dept. Aware to provide a copy of the vaccination record if obtained from local pharmacy or Health Dept. Verbalized acceptance and understanding.  Screening Tests Health Maintenance  Topic Date Due   DTaP/Tdap/Td (2 - Td or Tdap) 12/23/2021   COVID-19 Vaccine (4 - 2023-24 season) 12/19/2022 (Originally 04/09/2022)   INFLUENZA VACCINE  03/10/2023   Lung Cancer Screening  08/12/2023   Medicare Annual Wellness (AWV)  12/03/2023   MAMMOGRAM  11/11/2024   COLONOSCOPY (Pts 45-46yrs Insurance coverage will need to be confirmed)  12/11/2030   DEXA SCAN  Completed   Hepatitis C Screening  Completed   HPV VACCINES  Aged Out   Pneumonia Vaccine 74+ Years old  Discontinued   Zoster Vaccines- Shingrix  Discontinued    Health Maintenance Health Maintenance Due  Topic Date Due   DTaP/Tdap/Td (2 - Td or Tdap) 12/23/2021   Lung Cancer Screening: (Low Dose CT Chest recommended if Age 30-80 years, 30 pack-year currently smoking OR have quit w/in 15years.) does not qualify.   Hepatitis C Screening: Completed 10/11/16.  Vision Screening: Recommended annual ophthalmology exams for early detection of glaucoma and other disorders of the eye.  Dental Screening: Recommended annual dental exams for proper oral hygiene  Community Resource Referral / Chronic Care Management: CRR  required this visit?  No   CCM required this visit?  No      Plan:     I have personally reviewed and noted the following in the patient's chart:   Medical and social history Use of alcohol, tobacco or illicit drugs  Current medications and supplements including opioid prescriptions. Patient is not currently taking opioid prescriptions. Functional ability and status Nutritional status Physical activity Advanced directives List of other physicians Hospitalizations, surgeries, and ER visits in previous 12 months Vitals Screenings to include cognitive, depression, and falls Referrals and appointments  In addition, I have reviewed and discussed with patient certain preventive protocols, quality metrics, and best practice recommendations. A written personalized care plan for preventive services as well as general preventive health recommendations were provided to patient.     Cathey Endow, LPN   08/27/1476

## 2022-12-22 ENCOUNTER — Ambulatory Visit
Admission: RE | Admit: 2022-12-22 | Discharge: 2022-12-22 | Disposition: A | Discharge status: Home / Self Care | Payer: Medicare HMO | Source: Ambulatory Visit | Attending: Oncology | Admitting: Oncology

## 2022-12-22 DIAGNOSIS — Z87891 Personal history of nicotine dependence: Secondary | ICD-10-CM | POA: Diagnosis not present

## 2022-12-22 DIAGNOSIS — I700 Atherosclerosis of aorta: Secondary | ICD-10-CM | POA: Insufficient documentation

## 2022-12-22 DIAGNOSIS — C569 Malignant neoplasm of unspecified ovary: Secondary | ICD-10-CM | POA: Diagnosis not present

## 2022-12-22 DIAGNOSIS — C5700 Malignant neoplasm of unspecified fallopian tube: Secondary | ICD-10-CM | POA: Diagnosis not present

## 2022-12-22 MED ORDER — BARIUM SULFATE 2 % PO SUSP
450.0000 mL | Freq: Once | ORAL | Status: AC
  Administered 2022-12-22: 900 mL via ORAL

## 2023-01-18 ENCOUNTER — Inpatient Hospital Stay: Payer: Medicare HMO | Attending: Oncology

## 2023-01-18 DIAGNOSIS — N183 Chronic kidney disease, stage 3 unspecified: Secondary | ICD-10-CM | POA: Diagnosis not present

## 2023-01-18 DIAGNOSIS — Z79899 Other long term (current) drug therapy: Secondary | ICD-10-CM | POA: Insufficient documentation

## 2023-01-18 DIAGNOSIS — Z803 Family history of malignant neoplasm of breast: Secondary | ICD-10-CM | POA: Insufficient documentation

## 2023-01-18 DIAGNOSIS — J910 Malignant pleural effusion: Secondary | ICD-10-CM | POA: Insufficient documentation

## 2023-01-18 DIAGNOSIS — Z87891 Personal history of nicotine dependence: Secondary | ICD-10-CM | POA: Insufficient documentation

## 2023-01-18 DIAGNOSIS — Z808 Family history of malignant neoplasm of other organs or systems: Secondary | ICD-10-CM | POA: Diagnosis not present

## 2023-01-18 DIAGNOSIS — Z801 Family history of malignant neoplasm of trachea, bronchus and lung: Secondary | ICD-10-CM | POA: Insufficient documentation

## 2023-01-18 DIAGNOSIS — C5700 Malignant neoplasm of unspecified fallopian tube: Secondary | ICD-10-CM | POA: Insufficient documentation

## 2023-01-18 DIAGNOSIS — Z807 Family history of other malignant neoplasms of lymphoid, hematopoietic and related tissues: Secondary | ICD-10-CM | POA: Insufficient documentation

## 2023-01-18 DIAGNOSIS — G629 Polyneuropathy, unspecified: Secondary | ICD-10-CM | POA: Insufficient documentation

## 2023-01-18 LAB — CBC WITH DIFFERENTIAL (CANCER CENTER ONLY)
Abs Immature Granulocytes: 0.01 10*3/uL (ref 0.00–0.07)
Basophils Absolute: 0 10*3/uL (ref 0.0–0.1)
Basophils Relative: 1 %
Eosinophils Absolute: 0.1 10*3/uL (ref 0.0–0.5)
Eosinophils Relative: 2 %
HCT: 33 % — ABNORMAL LOW (ref 36.0–46.0)
Hemoglobin: 10.9 g/dL — ABNORMAL LOW (ref 12.0–15.0)
Immature Granulocytes: 0 %
Lymphocytes Relative: 27 %
Lymphs Abs: 1.2 10*3/uL (ref 0.7–4.0)
MCH: 34 pg (ref 26.0–34.0)
MCHC: 33 g/dL (ref 30.0–36.0)
MCV: 102.8 fL — ABNORMAL HIGH (ref 80.0–100.0)
Monocytes Absolute: 0.4 10*3/uL (ref 0.1–1.0)
Monocytes Relative: 9 %
Neutro Abs: 2.6 10*3/uL (ref 1.7–7.7)
Neutrophils Relative %: 61 %
Platelet Count: 179 10*3/uL (ref 150–400)
RBC: 3.21 MIL/uL — ABNORMAL LOW (ref 3.87–5.11)
RDW: 15 % (ref 11.5–15.5)
WBC Count: 4.2 10*3/uL (ref 4.0–10.5)
nRBC: 0 % (ref 0.0–0.2)

## 2023-01-18 LAB — CMP (CANCER CENTER ONLY)
ALT: 13 U/L (ref 0–44)
AST: 17 U/L (ref 15–41)
Albumin: 4 g/dL (ref 3.5–5.0)
Alkaline Phosphatase: 38 U/L (ref 38–126)
Anion gap: 6 (ref 5–15)
BUN: 10 mg/dL (ref 8–23)
CO2: 22 mmol/L (ref 22–32)
Calcium: 8.9 mg/dL (ref 8.9–10.3)
Chloride: 108 mmol/L (ref 98–111)
Creatinine: 0.93 mg/dL (ref 0.44–1.00)
GFR, Estimated: >60 mL/min (ref >60)
Glucose, Bld: 110 mg/dL — ABNORMAL HIGH (ref 70–99)
Potassium: 3.3 mmol/L — ABNORMAL LOW (ref 3.5–5.1)
Sodium: 136 mmol/L (ref 135–145)
Total Bilirubin: 0.6 mg/dL (ref 0.3–1.2)
Total Protein: 7 g/dL (ref 6.5–8.1)

## 2023-01-19 LAB — CA 125: Cancer Antigen (CA) 125: 4 U/mL (ref 0.0–38.1)

## 2023-01-26 ENCOUNTER — Encounter: Payer: Self-pay | Admitting: Oncology

## 2023-01-26 ENCOUNTER — Other Ambulatory Visit: Payer: Self-pay

## 2023-01-26 ENCOUNTER — Inpatient Hospital Stay (HOSPITAL_BASED_OUTPATIENT_CLINIC_OR_DEPARTMENT_OTHER): Payer: Medicare HMO | Admitting: Oncology

## 2023-01-26 VITALS — BP 127/70 | HR 67 | Temp 97.2°F | Resp 18 | Wt 204.5 lb

## 2023-01-26 DIAGNOSIS — D539 Nutritional anemia, unspecified: Secondary | ICD-10-CM | POA: Insufficient documentation

## 2023-01-26 DIAGNOSIS — Z87891 Personal history of nicotine dependence: Secondary | ICD-10-CM | POA: Diagnosis not present

## 2023-01-26 DIAGNOSIS — Z5111 Encounter for antineoplastic chemotherapy: Secondary | ICD-10-CM

## 2023-01-26 DIAGNOSIS — Z79899 Other long term (current) drug therapy: Secondary | ICD-10-CM | POA: Diagnosis not present

## 2023-01-26 DIAGNOSIS — Z807 Family history of other malignant neoplasms of lymphoid, hematopoietic and related tissues: Secondary | ICD-10-CM | POA: Diagnosis not present

## 2023-01-26 DIAGNOSIS — N183 Chronic kidney disease, stage 3 unspecified: Secondary | ICD-10-CM | POA: Diagnosis not present

## 2023-01-26 DIAGNOSIS — Z803 Family history of malignant neoplasm of breast: Secondary | ICD-10-CM | POA: Diagnosis not present

## 2023-01-26 DIAGNOSIS — G629 Polyneuropathy, unspecified: Secondary | ICD-10-CM

## 2023-01-26 DIAGNOSIS — E876 Hypokalemia: Secondary | ICD-10-CM | POA: Diagnosis not present

## 2023-01-26 DIAGNOSIS — Z801 Family history of malignant neoplasm of trachea, bronchus and lung: Secondary | ICD-10-CM | POA: Diagnosis not present

## 2023-01-26 DIAGNOSIS — C5700 Malignant neoplasm of unspecified fallopian tube: Secondary | ICD-10-CM

## 2023-01-26 DIAGNOSIS — J910 Malignant pleural effusion: Secondary | ICD-10-CM | POA: Diagnosis not present

## 2023-01-26 DIAGNOSIS — N1831 Chronic kidney disease, stage 3a: Secondary | ICD-10-CM

## 2023-01-26 DIAGNOSIS — Z808 Family history of malignant neoplasm of other organs or systems: Secondary | ICD-10-CM | POA: Diagnosis not present

## 2023-01-26 NOTE — Progress Notes (Signed)
Hematology/Oncology Progress note [phone] [phone]   REASON FOR VISIT Follow up for High grade serous adenocarcinoma.    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 S/p 4 cycles of neoadjuvant chemotherapy  Carboplatin /Taxol  S/p total hysterectomy with salpingo-oophorectomy and omentectomy. Optimal (R<1) interval tumor debulking, -followed by 3 cycles of adjuvant carboplatin/Taxol. Labs reviewed and discussed with patient.  CA125 is stable. CT scan showed no evidence of cancer recurrence. She has been on follow-up.  300 mg twice daily since January 2022.  Total 2.5-year.    recommend patient to finish current supply she has and then stop.    CKD (chronic kidney disease) stage 3, GFR 30-59 ml/min (HCC) Encourage oral hydration and avoid nephrotoxins.     Neuropathy She did not tolerate Lyrica to 100 mg twice daily.   Self discontinued gabapentin 300mg  QHS and Cymbalta 30mg  daily.  Symptoms are stable.   Encounter for antineoplastic chemotherapy Continue current chemotherapy treatments.    Orders Placed This Encounter  Procedures   CT Abdomen Pelvis W Contrast    Standing Status:   Future    Standing Expiration Date:   01/26/2024    Order Specific Question:   If indicated for the ordered procedure, I authorize the administration of contrast media per Radiology protocol    Answer:   Yes    Order Specific Question:   Does the patient have a contrast media/X-ray dye allergy?    Answer:   No    Order Specific Question:   Preferred imaging location?     Answer:   [facility]    Order Specific Question:   If indicated for the ordered procedure, I authorize the administration of oral contrast media per Radiology protocol    Answer:   Yes   CBC with Differential (Cancer Center Only)    Standing Status:   Future    Standing Expiration Date:   01/26/2024   CMP (Cancer Center only)    Standing Status:   Future    Standing Expiration Date:   01/26/2024   CA 125    Standing Status:   Future    Standing Expiration Date:   01/26/2024   Follow up in 3 months  All questions were answered. The patient knows to call the clinic with any problems, questions or concerns.  [name], MD, PhD [facility] Hematology Oncology 01/26/2023    PERTINENT ONCOLOGY HISTORY Linda Schmitt is a 67 y.o.afemale who has above oncology history reviewed by me presents for follow up of carcinoma of fallopian tube   Oncology History  Carcinoma of fallopian tube (HCC)  02/18/2020 Cancer Staging    -status post thoracentesis x2 during her recent admission. Cytology was positive for metastatic carcinoma.  TTF-1 Napsin A, GATA3, CDX2 were negative. Positive for PAX 8, which is most often expressing tumors of gynecological and renal origin Staging form: Ovary, Fallopian Tube, and Primary Peritoneal Carcinoma, AJCC 8th Edition - Pathologic stage from 02/18/2020: FIGO Stage IVA (pM1a) - Signed by [name], MD on 08/12/2020   02/25/2020 Imaging   PET scan initial staging 1. Exam shows evidence of peritoneal disease within the abdomen including FDG  avid soft tissue infiltration into the omentum.  2. Large, loculated left pleural effusion with thin, FDG avid rind of soft tissue overlying the left lung. This corresponds to known, pathology proven malignant pleural effusion. 3. Small right pleural effusion with mild FDG uptake is equivocal for malignant effusion of the right hemithorax. 4. Fluid density structure within right side of pelvis abutting the right-side of the  uterus is noted. This is indeterminate. Further investigation with pelvic sonogram may be helpful.   03/04/2020 - 05/06/2020 Chemotherapy   4 cycles of neoadjuvant carbo and Taxol   04/24/2020 Imaging   CT chest abdomen pelvis with contrast showed 1. Moderate left malignant pleural effusion, decreased in size compared to the prior examination. 2. Omental caking highly concerning for intraperitoneal metastatic disease. 3. No definite primary malignancy confidently identified on today's examination. 4. Stable nodule in the lateral limb of the left adrenal gland dating back to at least 2017, presumably a benign lesions such as an adrenal adenoma.5. Aortic atherosclerosis.6. Additional incidental findings, as above.     05/21/2020 Imaging   CT chest with contrast 1. Small left pleural effusion similar to prior CT. Overall no significant interval change compared to the prior CT. 2. Stable left adrenal nodule. 3. Aortic Atherosclerosis (ICD10-I70.0).   06/05/2020 Procedure    status post Total hysterectomy and bilateral salpingo-oophorectomy, Omentectomy, and Optimal (R<1) interval tumor debulking. A. Peritoneal nodule, excisional biopsy: Fibroadipose tissue with calcifications, negative for tumor. B. Uterus, bilateral ovaries and fallopian tubes, hysterectomy and bilateral salpingo-oophorectomy: Cervix: Squamous metaplasia. Endometrium: Inactive endometrium. Myometrium: Adenomyosis. Serosa: High grade serous adenocarcinoma. Right ovary: High grade serous adenocarcinoma, involving the ovarian surface and parenchyma. Right fallopian tube: No specific pathologic change. Left ovary: High grade serous adenocarcinoma, involving the ovarian surface and parenchyma. Left fallopian tube: High grade serous adenocarcinoma, involving tubal fimbria. Serous tubal intraepithelial carcinoma. Immunohistochemistry was performed on block B14 at [facility] to characterize the pathologic process and  demonstrates the following immunophenotype in the cells of interest:POSITIVE:  p53, p16 (strong, patchy), Ki67 (elevated) This staining profile supports the above diagnosis. C. Peritoneal nodule, left pericolic gutter, excisional biopsy: High grade serous adenocarcinoma. D. Cul-de-sac, excisional biopsy: High grade serous adenocarcinoma. E. Omentum, excision: High grade serous adenocarcinoma (up to 3.0 cm).   TUMOR   Tumor Site:    Left fallopian tube   Histologic Type:    Serous carcinoma   Histologic Grade:    High grade   Tumor Size:    Greatest Dimension (Centimeters): 0.2 cm   Ovarian Surface Involvement:    Present     Laterality:    Bilateral   Fallopian Tube Surface Involvement:    Present     Laterality:    Left   Implants:    Present (sites): left peritoneum, cul-de-sac, omentum   Other Tissue / Organ Involvement:    Right ovary   Other Tissue / Organ Involvement:    Left ovary   Other Tissue / Organ Involvement:    Left fallopian tube   Other Tissue / Organ Involvement:    Pelvic peritoneum   Other Tissue / Organ Involvement:    Omentum   Largest Extrapelvic Peritoneal Focus:    Macroscopic (greater than 2 cm)   Peritoneal / Ascitic Fluid:    Not submitted / unknown   Pleural Fluid:    Not submitted / unknown   Treatment Effect:    Moderate response identified (CRS 2)   LYMPH NODES   Regional Lymph Nodes:  No lymph nodes submitted or found   PATHOLOGIC STAGE CLASSIFICATION (pTNM, AJCC 8th Edition)   TNM Descriptors:    y (post-treatment)   Primary Tumor (pT):    pT3c   Regional Lymph Nodes (pN):    pNX  FIGO Stage:    IIIC   + pleural effusion so STAGE IV No germline BRCA mutation, -Homologous recombination deficiency positive.  Myriad Genomic instability score:  positive, somatic BRCA1 positive   06/25/2020 Initial Diagnosis   Carcinoma of fallopian tube (HCC)   07/01/2020 - 08/12/2020 Chemotherapy   3 cycles of adjuvant chemotherapy with carboplatin AUC 5,  Taxol 175 mg/m.     09/02/2020 -  Chemotherapy   started on olaparib 300 mg twice daily.   11/16/2020 Imaging   CT chest abdomen pelvis with contrast showed 1. Status post interval hysterectomy and bilateral salpingectomy. 2. Status post interval omentectomy. There is no residual suspicious peritoneal nodularity identified. Unchanged small nodules adjacent to the spleen which are stable compared to examinations dating back to 2017 and likely small splenules and/or lymph nodes. Attention on follow-up 3. Interval resolution of a previously seen moderate left pleural effusion. There is minimal residual pleural thickening. 4. Findings are consistent with treatment response.5. No evidence of new metastatic disease in the chest, abdomen, or pelvis.   Aortic Atherosclerosis (ICD10-I70.0).   03/18/2021 Imaging   CT chest abdomen pelvis with contrast showed Chest Impression: 1. No evidence metastatic disease in the thorax. 2. Stable borderline enlarged mediastinal lymph nodes and RIGHT hilar node. Recommend attention on follow-up. Abdomen / Pelvis Impression: 1. No evidence of peritoneal or omental nodal metastatic recurrence. 2. No evidence of local recurrence in the pelvis. 3. No free fluid in the abdomen pelvis.    07/14/2021 Imaging   CT chest abdomen pelvis with contrast showed 1. No evidence of new or progressive disease within the chest,abdomen, or pelvis. 2. Stable borderline enlarged mediastinal and right hilar lymph nodes, nonspecific and possibly reactive. Recommend attention on follow-up imaging. 3. Moderate volume of formed stool throughout the colon suggestive of constipation. 4. Stable 1.4 cm left adrenal nodule previously characterized as an adenoma on PET-CT February 25, 2020. 5.  Aortic Atherosclerosis (ICD10-I70.0).   08/12/2021 Imaging   CT chest abdomen pelvis wo contrast  1. No evidence of new metastatic disease. 2. Left adrenal adenoma. 3.  Aortic atherosclerosis  (ICD10-I70.0). 4.  Emphysema (ICD10-J43.9).   11/24/2021 Imaging   CT chest abdomen pelvis with contrast 1. No new or progressive findings in the chest, abdomen or pelvis.2. Scattered small perisplenic nodules are unchanged potentially small splenules. 3. Stable LEFT adrenal lesion, unchanged dating back to October of 2017. This likely represents an adenoma. 4. Post hysterectomy.5. Aortic atherosclerosis.   04/07/2022 Imaging   CT chest abdomen pelvis without contrast 1. Status post hysterectomy, oophorectomy, and omentectomy. No noncontrast evidence of recurrent or metastatic disease in the chest, abdomen, or pelvis. 2. Multiple small nodules adjacent to the spleen in the left upper quadrant, measuring up to 0.9 x 0.7 cm. These are again most likely small accessory splenules, not previously FDG avid. Attention on follow-up. 3. Minimal emphysema, diffuse bilateral bronchial wall thickening, and background of very fine centrilobular nodules, most concentrated in the lung apices, consistent with smoking-related respiratory bronchiolitis.  Aortic Atherosclerosis (ICD10-I70.0) and Emphysema (ICD10-J43.9).   12/22/2022 Imaging   CT chest abdomen pelvis without contrast showed 1. Subtle areas of new bilateral peribronchovascular ground-glass and ground-glass nodularity, suspicious for mild infection, including atypical etiologies. 2. No  evidence of metastatic disease, status post hysterectomy. 3. Left adrenal nodule which is similar back to 2021 PET and can be presumed benign. Likely a lipid poor adenoma. No specific follow-up indicated. 4.  Aortic Atherosclerosis (ICD10-I70.0).        INTERVAL HISTORY 67 year old female presents for follow-up of stage IV ovarian cancer,  somatic BRCA1 positive and HRD positive.  Patient has been on olaparib since January 2022.   Overall patient tolerates well denies any nausea vomiting diarrhea.  She feels well.   + Neuropathy, numbness of her toes,  symptoms are stable or improved.  She has stopped taking gabapentin and Cymbalta. She has intentional weight loss due to her new job being more active.  Review of Systems  Constitutional:  Negative for appetite change, chills, fatigue and fever.  HENT:   Negative for hearing loss and voice change.   Eyes:  Negative for eye problems.  Respiratory:  Negative for chest tightness and cough.   Cardiovascular:  Negative for chest pain.  Gastrointestinal:  Negative for abdominal distention, abdominal pain and blood in stool.  Endocrine: Negative for hot flashes.  Genitourinary:  Negative for difficulty urinating and frequency.   Musculoskeletal:  Negative for arthralgias.  Skin:  Negative for itching and rash.  Neurological:  Positive for numbness. Negative for extremity weakness.  Hematological:  Negative for adenopathy.  Psychiatric/Behavioral:  Positive for sleep disturbance. Negative for confusion.         No Known Allergies   Past Medical History:  Diagnosis Date   Cancer (HCC)    ovarian cancer   Family history of breast cancer    Genetic testing 04/09/2020   No pathogenic variants identified on the Myriad MyRisk (germline) panel. The report date is 04/07/2020.   The [facility] gene panel offered by [employer] includes sequencing and deletion/duplication testing of the following 35 genes: APC, ATM, AXIN2, BARD1, BMPR1A, BRCA1, BRCA2, BRIP1, CHD1, CDK4, CDKN2A, CHEK2, EPCAM (large rearrangement only), HOXB13, GALNT12, MLH1, MSH2, MSH3, MSH6   HLD (hyperlipidemia)      Past Surgical History:  Procedure Laterality Date   CARPAL TUNNEL RELEASE Left    COLONOSCOPY WITH PROPOFOL N/A 12/10/2020   Procedure: COLONOSCOPY WITH PROPOFOL;  Surgeon: [name], MD;  Location: [facility] ENDOSCOPY;  Service: Endoscopy;  Laterality: N/A;   ROBOTIC ASSISTED TOTAL HYSTERECTOMY WITH BILATERAL SALPINGO OOPHERECTOMY  06/05/2020   with omentectomy tumor debulking via  mini-laparotomy/hand assisted port   SHOULDER ARTHROSCOPY WITH SUBACROMIAL DECOMPRESSION AND OPEN ROTATOR C Right 02/01/2020   Procedure: RIGHT SHOULDER ARTHROSCOPIC SUBSCAPULARIS REPAIR, ROTATOR CUFF REPAIR, SUABCROMIAL DECOMPRESSION, AND BICEPS TENODESIS.;  Surgeon: Signa Kell, MD;  Location: [facility] ORS;  Service: Orthopedics;  Laterality: Right;   TUBAL LIGATION      Social History   Socioeconomic History   Marital status: Single    Spouse name: Not on file   Number of children: Not on file   Years of education: Not on file   Highest education level: Not on file  Occupational History   Not on file  Tobacco Use   Smoking status: Former    Packs/day: 0.75    Years: 40.00    Additional pack years: 0.00    Total pack years: 30.00    Types: Cigarettes    Quit date: 02/06/2018    Years since quitting: 4.9   Smokeless tobacco: Never   Tobacco comments:    quit 02/2018  Vaping Use   Vaping Use: Never used  Substance and Sexual Activity  Alcohol use: Yes    Comment: 2-3 glasses of wine per week,none last 24hrs   Drug use: No   Sexual activity: Not on file  Other Topics Concern   Not on file  Social History Narrative   Lives in [place] alone. Work - [employer]   Diet: Regular   Exercise: walking   Social Determinants of Corporate investment banker Strain: Low Risk  (12/03/2022)   Overall Financial Resource Strain (CARDIA)    Difficulty of Paying Living Expenses: Not hard at all  Food Insecurity: No Food Insecurity (12/03/2022)   Hunger Vital Sign    Worried About Running Out of Food in the Last Year: Never true    Ran Out of Food in the Last Year: Never true  Transportation Needs: No Transportation Needs (12/03/2022)   PRAPARE - Administrator, Civil Service (Medical): No    Lack of Transportation (Non-Medical): No  Physical Activity: Insufficiently Active (12/03/2022)   Exercise Vital Sign    Days of Exercise per Week: 3 days    Minutes of Exercise per  Session: 20 min  Stress: No Stress Concern Present (12/03/2022)   [employer] of Occupational Health - Occupational Stress Questionnaire    Feeling of Stress : Not at all  Social Connections: Unknown (12/03/2022)   Social Connection and Isolation Panel [NHANES]    Frequency of Communication with Friends and Family: More than three times a week    Frequency of Social Gatherings with Friends and Family: More than three times a week    Attends Religious Services: Not on file    Active Member of Clubs or Organizations: Not on file    Attends Banker Meetings: Not on file    Marital Status: Not on file  Intimate Partner Violence: Not At Risk (12/03/2022)   Humiliation, Afraid, Rape, and Kick questionnaire    Fear of Current or Ex-Partner: No    Emotionally Abused: No    Physically Abused: No    Sexually Abused: No    Family History  Problem Relation Age of Onset   Cancer Mother        lymphoma   Stroke Sister    Cancer Brother        lung   Cancer Sister        lung   Lung cancer Brother    Bone cancer Sister    Breast cancer Cousin 45       maternal   Cancer Maternal Aunt        unk types     Current Outpatient Medications:    cholecalciferol (VITAMIN D3) 10 MCG (400 UNIT) TABS tablet, Take 800 Units by mouth daily., Disp: , Rfl:    cyanocobalamin 1000 MCG tablet, Take 400 mcg by mouth daily., Disp: , Rfl:    DULoxetine (CYMBALTA) 30 MG capsule, TAKE 1 CAPSULE BY MOUTH EVERY DAY, Disp: 90 capsule, Rfl: 2   gabapentin (NEURONTIN) 300 MG capsule, Take 1 capsule (300 mg total) by mouth at bedtime., Disp: 60 capsule, Rfl: 2   olaparib (LYNPARZA) 150 MG tablet, Take 2 tablets (300 mg total) by mouth 2 (two) times daily. May take with food to decrease nausea and vomiting., Disp: 120 tablet, Rfl: 2   pravastatin (PRAVACHOL) 40 MG tablet, TAKE 1 TABLET BY MOUTH EVERY DAY IN THE EVENING, Disp: 90 tablet, Rfl: 3  Physical exam:  Vitals:   01/26/23 1018  BP: 127/70   Pulse: 67  Resp: 18  Temp: (!) 97.2 F (36.2 C)  Weight: 204 lb 8 oz (92.8 kg)   Physical Exam Constitutional:      General: She is not in acute distress.    Appearance: She is not diaphoretic.  HENT:     Head: Normocephalic.  Eyes:     General: No scleral icterus. Cardiovascular:     Rate and Rhythm: Normal rate and regular rhythm.     Heart sounds: No murmur heard. Pulmonary:     Effort: Pulmonary effort is normal. No respiratory distress.     Breath sounds: No wheezing.  Chest:     Chest wall: No tenderness.  Abdominal:     General: Bowel sounds are normal. There is no distension.     Palpations: Abdomen is soft.  Musculoskeletal:        General: Normal range of motion.     Cervical back: Normal range of motion and neck supple.  Skin:    General: Skin is warm and dry.     Findings: No erythema.  Neurological:     Mental Status: She is alert and oriented to person, place, and time. Mental status is at baseline.     Cranial Nerves: No cranial nerve deficit.     Motor: No abnormal muscle tone.     Coordination: Coordination normal.  Psychiatric:        Mood and Affect: Mood and affect normal.     Labs are reviewed and discussed with patient.    Latest Ref Rng & Units 01/18/2023    9:48 AM 11/16/2022    9:49 AM 09/29/2022    9:56 AM  CBC  WBC 4.0 - 10.5 K/uL 4.2  4.3  4.0   Hemoglobin 12.0 - 15.0 g/dL 16.1  09.6  04.5   Hematocrit 36.0 - 46.0 % 33.0  34.4  32.7   Platelets 150 - 400 K/uL 179  155  171       Latest Ref Rng & Units 01/18/2023    9:48 AM 11/16/2022    9:49 AM 09/29/2022    9:56 AM  CMP  Glucose 70 - 99 mg/dL 409  811  914   BUN 8 - 23 mg/dL 10  9  18    Creatinine 0.44 - 1.00 mg/dL 7.82  9.56  2.13   Sodium 135 - 145 mmol/L 136  139  138   Potassium 3.5 - 5.1 mmol/L 3.3  3.4  3.8   Chloride 98 - 111 mmol/L 108  106  105   CO2 22 - 32 mmol/L 22  24  25    Calcium 8.9 - 10.3 mg/dL 8.9  8.9  8.8   Total Protein 6.5 - 8.1 g/dL 7.0  7.0  6.8   Total  Bilirubin 0.3 - 1.2 mg/dL 0.6  0.8  0.5   Alkaline Phos 38 - 126 U/L 38  46  44   AST 15 - 41 U/L 17  17  16    ALT 0 - 44 U/L 13  12  12     RADIOGRAPHIC STUDIES: I have personally reviewed the radiological images as listed and agreed with the findings in the report. CT CHEST ABDOMEN PELVIS WO CONTRAST  Result Date: 12/24/2022 CLINICAL DATA:  Restaging of fallopian tube cancer diagnosed in 2021. Hysterectomy. Bilateral oophorectomy. Chemotherapy. Asymptomatic. Ex-smoker. Currently on chemotherapy. Prior omentectomy. * Tracking Code: BO * EXAM: CT CHEST, ABDOMEN AND PELVIS WITHOUT CONTRAST TECHNIQUE: Multidetector CT imaging of the chest, abdomen and pelvis was performed following the  standard protocol without IV contrast. RADIATION DOSE REDUCTION: This exam was performed according to the departmental dose-optimization program which includes automated exposure control, adjustment of the mA and/or kV according to patient size and/or use of iterative reconstruction technique. COMPARISON:  08/11/2022 FINDINGS: CT CHEST FINDINGS Cardiovascular: Aortic atherosclerosis. Tortuous thoracic aorta. Normal heart size, without pericardial effusion. Mediastinum/Nodes: No supraclavicular adenopathy. No mediastinal or hilar adenopathy, given limitations of unenhanced CT. Lungs/Pleura: No pleural fluid. No findings of pulmonary metastasis. Scattered areas of vague ground-glass and nodularity are new. Example ground-glass in the peribronchovascular right apex on 27/3. Peribronchovascular ground-glass nodularity in the anterior left upper lobe on 60/3 and left lower lobe on 82/3. Musculoskeletal: Fluid within the left subcoracoid bursa is similar on 09/02. No acute osseous abnormality. CT ABDOMEN PELVIS FINDINGS Hepatobiliary: Normal liver. Normal gallbladder, without biliary ductal dilatation. Pancreas: Normal, without mass or ductal dilatation. Spleen: Normal in size, without focal abnormality. Multiple splenules are  similar. Adrenals/Urinary Tract: Normal right adrenal gland. 1.6 cm left adrenal nodule is similar in size and measures 34 HU today. Stable since 02/25/2020 PET. Not hypermetabolic on that exam. No renal calculi or hydronephrosis. Normal urinary bladder. Stomach/Bowel: Normal stomach, without wall thickening. Normal colon, appendix, and terminal ileum. Normal small bowel. Vascular/Lymphatic: Aortic atherosclerosis. No abdominopelvic adenopathy. Reproductive: Hysterectomy.  No adnexal mass. Other: No significant free fluid. No evidence of omental or peritoneal disease. Musculoskeletal: Similar superior endplate irregularity at T12 is favored to represent a Schmorl's node. IMPRESSION: 1. Subtle areas of new bilateral peribronchovascular ground-glass and ground-glass nodularity, suspicious for mild infection, including atypical etiologies. 2. No evidence of metastatic disease, status post hysterectomy. 3. Left adrenal nodule which is similar back to 2021 PET and can be presumed benign. Likely a lipid poor adenoma. No specific follow-up indicated. 4.  Aortic Atherosclerosis (ICD10-I70.0). Electronically Signed   By: Jeronimo Greaves M.D.   On: 12/24/2022 13:55   MM 3D DIAGNOSTIC MAMMOGRAM UNILATERAL RIGHT BREAST  Result Date: 12/01/2022 CLINICAL DATA:  Recall from screening mammogram for a possible mass in the right breast. EXAM: DIGITAL DIAGNOSTIC UNILATERAL RIGHT MAMMOGRAM WITH TOMOSYNTHESIS TECHNIQUE: Right digital diagnostic mammography and breast tomosynthesis was performed. COMPARISON:  Previous exam(s). ACR Breast Density Category b: There are scattered areas of fibroglandular density. FINDINGS: Additional views including spot compression demonstrate the previously questioned mass is not significantly changed since at least 01/29/2015 and either represents a normal intramammary lymph node or a benign mass. No suspicious mass, microcalcification, or other finding is identified. IMPRESSION: Normal intramammary  lymph node versus benign mass in the right breast. No evidence of malignancy. RECOMMENDATION: Recommend routine annual screening mammogram in 1 year. I have discussed the findings and recommendations with the patient. If applicable, a reminder letter will be sent to the patient regarding the next appointment. BI-RADS CATEGORY  2: Benign. Electronically Signed   By: Romona Curls M.D.   On: 12/01/2022 17:02  MM 3D SCREEN BREAST BILATERAL  Result Date: 11/15/2022 CLINICAL DATA:  Screening. EXAM: DIGITAL SCREENING BILATERAL MAMMOGRAM WITH TOMOSYNTHESIS AND CAD TECHNIQUE: Bilateral screening digital craniocaudal and mediolateral oblique mammograms were obtained. Bilateral screening digital breast tomosynthesis was performed. The images were evaluated with computer-aided detection. COMPARISON:  Previous exam(s). ACR Breast Density Category b: There are scattered areas of fibroglandular density. FINDINGS: In the right breast, a possible mass warrants further evaluation. In the left breast, no findings suspicious for malignancy. IMPRESSION: Further evaluation is suggested for a possible mass in the right breast. RECOMMENDATION: Diagnostic mammogram and possibly ultrasound  of the right breast. (Code:[id number]) The patient will be contacted regarding the findings, and additional imaging will be scheduled. BI-RADS CATEGORY  0: Incomplete: Need additional imaging evaluation. Electronically Signed   By: Frederico Hamman M.D.   On: 11/15/2022 12:45

## 2023-01-26 NOTE — Assessment & Plan Note (Addendum)
+   pleural effusion cytology  -( somatic BRCA1 positive, HRD positive). S/p 4 cycles of neoadjuvant chemotherapy  Carboplatin /Taxol  S/p total hysterectomy with salpingo-oophorectomy and omentectomy. Optimal (R<1) interval tumor debulking, -followed by 3 cycles of adjuvant carboplatin/Taxol. Labs reviewed and discussed with patient.  CA125 is stable. CT scan showed no evidence of cancer recurrence. She has been on follow-up.  300 mg twice daily since January 2022.  Total 2.5-year.    recommend patient to finish current supply she has and then stop.

## 2023-01-26 NOTE — Assessment & Plan Note (Signed)
Recommend potassium daily for 3 days.  Patient reports that she has home supply of potassium and will take

## 2023-01-26 NOTE — Assessment & Plan Note (Signed)
Encourage oral hydration and avoid nephrotoxins.   

## 2023-01-26 NOTE — Assessment & Plan Note (Signed)
Chronic, hemoglobin has been stable. Multifactorial-olaparib side effects, anemia due to chronic kidney disease.  Close monitor.

## 2023-01-26 NOTE — Assessment & Plan Note (Addendum)
She did not tolerate Lyrica to 100 mg twice daily.   Self discontinued gabapentin 300mg  QHS and Cymbalta 30mg  daily.  Symptoms are stable.

## 2023-01-26 NOTE — Assessment & Plan Note (Signed)
Continue current chemotherapy treatments. 

## 2023-01-27 ENCOUNTER — Telehealth: Payer: Self-pay

## 2023-01-27 NOTE — Telephone Encounter (Signed)
Received notification from MD that patient would be stopping [place] after she finishes her supply on hand. I have called and cancelled enrollment to [place] Patient Assistance Program.  Reference# [id number]   Ardeen Fillers, CPhT Oncology Pharmacy Patient Advocate  [facility]  [phone] (phone) [phone] (fax) 01/27/2023 9:54 AM

## 2023-04-06 ENCOUNTER — Inpatient Hospital Stay: Payer: Medicare HMO | Attending: Oncology | Admitting: Obstetrics and Gynecology

## 2023-04-06 VITALS — BP 156/81 | HR 70 | Temp 96.9°F | Resp 20 | Wt 208.4 lb

## 2023-04-06 DIAGNOSIS — G620 Drug-induced polyneuropathy: Secondary | ICD-10-CM | POA: Diagnosis not present

## 2023-04-06 DIAGNOSIS — Z8544 Personal history of malignant neoplasm of other female genital organs: Secondary | ICD-10-CM | POA: Diagnosis not present

## 2023-04-06 DIAGNOSIS — C5700 Malignant neoplasm of unspecified fallopian tube: Secondary | ICD-10-CM

## 2023-04-06 DIAGNOSIS — Z1501 Genetic susceptibility to malignant neoplasm of breast: Secondary | ICD-10-CM | POA: Diagnosis not present

## 2023-04-06 DIAGNOSIS — Z90710 Acquired absence of both cervix and uterus: Secondary | ICD-10-CM | POA: Insufficient documentation

## 2023-04-06 DIAGNOSIS — Z1502 Genetic susceptibility to malignant neoplasm of ovary: Secondary | ICD-10-CM | POA: Diagnosis not present

## 2023-04-06 DIAGNOSIS — Z90722 Acquired absence of ovaries, bilateral: Secondary | ICD-10-CM | POA: Diagnosis not present

## 2023-04-06 DIAGNOSIS — Z6836 Body mass index (BMI) 36.0-36.9, adult: Secondary | ICD-10-CM | POA: Diagnosis not present

## 2023-04-06 DIAGNOSIS — Z9221 Personal history of antineoplastic chemotherapy: Secondary | ICD-10-CM | POA: Diagnosis not present

## 2023-04-06 DIAGNOSIS — Z148 Genetic carrier of other disease: Secondary | ICD-10-CM | POA: Insufficient documentation

## 2023-04-06 DIAGNOSIS — Z1509 Genetic susceptibility to other malignant neoplasm: Secondary | ICD-10-CM | POA: Insufficient documentation

## 2023-04-06 DIAGNOSIS — Z79899 Other long term (current) drug therapy: Secondary | ICD-10-CM | POA: Insufficient documentation

## 2023-04-06 NOTE — Progress Notes (Signed)
Gynecologic Oncology Interval Visit  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  Subjective:  FORESTINE SECKMAN is a 67 y.o. G1P1 female, initially seen in consultation from 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   She had imaging on 12/22/22 with Dr Cathie Hoops- CT C/A/P wo contrast that was negative for recurrent or metastatic disease. Olaparib well without significant side effects, but discontinued in 7/24 after 2.5 years. CA 125 has been followed by Dr Cathie Hoops and remains low. She continues to feel well and denies complaints other than persistent CIPN.  Gabapentin was not helpful.   Component Ref Range & Units 7 d ago 1 mo ago 3 mo ago 4 mo ago 6 mo ago 7 mo ago 8 mo ago  Cancer Antigen (CA) 125 0.0 - 38.1 U/mL 4.9 4.1 CM 3.7 CM 3.6 CM 3.6 CM 4.1 CM 4.8 CM   CT 12/22/22  IMPRESSION: 1. Subtle areas of new bilateral peribronchovascular ground-glass and ground-glass nodularity, suspicious for mild infection, including atypical etiologies. 2. No evidence of metastatic disease, status post hysterectomy. 3. Left adrenal nodule which is similar back to  2021 PET and can be presumed benign. Likely a lipid poor adenoma. No specific follow-up indicated. 4.  Aortic Atherosclerosis (ICD10-I70.0).  Gynecologic Oncology History:  CAROL-ANN BEVILLE is a 67 y.o. female, initially seen in consultation from 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 Her history is as follows:  She presented with malignant pleural effusion two weeks after elective right shoulder surgery. She underwent thoracentesis on 02/18/2020 and 02/19/2020 with 1.8L and 1.2L removed respectively. Cytology was positive for PAX 8. PET scan showed evidence of peritoneal disease within the abdomen, soft tissue infiltrating into the omentum, large loculated left pleural effusion with thin FDG avid rind of soft tissue overlying the left lung. Small right pleural effusion with mild FDG uptake is equivocal for malignant effusion of the right hemithorax. Fluid density structure within right side of pelvis abutting the right sided uterus noted though indeterminate. Pelvic u/s obtained showed heterogeneous myometrium with poor definition of endometrial complex. Normal left ovary.  No cystic right adnexal lesion is identified.  No a probable small normal-appearing right ovary is identified.  Suspect that the low-attenuation presumed cystic structure seen in the right adnexa on the prior image corresponds to a small amount of nonspecific free fluid in the right adnexa.  03/05/20 CT-GUIDED BIOPSY: HIGH-GRADE SEROUS CARCINOMA.   Given that cytology from left pleural effusion is positive for metastatic carcinoma, tumor cells are positive for PAX 8 which indicates GYN origin.  Given that patient has moderate to large amount of pleural effusion, heavy tumor burden, recommend to start chemotherapy as soon as possible with carboplatin AUC 5-6 and paclitaxel 175 mg/m every 3 weeks for 3  cycles.  Patient will establish care with gynecology oncology for evaluation of participating in clinical  trials after neoadjuvant chemo/surgery. CA 125 was 53.6 at time of diagnosis.   Treatment Summary: 03/04/20 Cycle 1 carboplatin-paclitaxel CA 125 53.6 03/25/20 Cycle 2 carboplatin-paclitaxel CA 125 39.9 04/15/20  Cycle 3 carboplatin-paclitaxel CA 125 10.5  04/25/20 CT C/A/P revealed moderate left malignant pleural effusion with some pleural thickening and enhancement which was decreased in size compared to prior exam. Omental caking apparent concerning for intraperitoneal metastatic disease. No definite primary malignancy identified. Stable left adrenal nodule. Aortic atherosclerosis.   05/06/2020 Cycle 4 carboplatin-paclitaxel CA125 5.8  On 06/05/2020 she underwent exam under anesthesia, Diagnostic laparoscopy, Robot-assisted total laparoscopic hysterectomy, Bilateral salpingo-oophorectomy, Omentectomy via mini-laparotomy/hand assisted port, and Optimal (R<1) interval tumor debulking. Pathology c/w tubal origin.   Pathology: High grade serous adenocarcinoma involving bilateral ovaries and fallopian tubes; peritoneal nodules, cul-de-sac and omentum (primary site left tube). p53, p16 (strong, patchy), Ki67 (elevated)  11/22/2021Cycle #5 paclitaxel and carboplatin CA125 4.6 07/22/2020 Cycle #6 paclitaxel and carboplatin  CA125 4.16 August 2020 olaparib maintenance initiated  10/29/2020 Patient complaining of pelvic pain/pressure as well as constipation at GYN ONC visit today. CTA ordered and completed on 11/14/2020. Included below.  IMPRESSION: 1. Status post interval hysterectomy and bilateral salpingectomy. 2. Status post interval omentectomy. There is no residual suspicious peritoneal nodularity identified. Unchanged small nodules adjacent to the spleen which are stable compared to examinations dating back to 2017 and likely small splenules and/or lymph nodes. Attention on follow-up  3. Interval resolution of a previously seen moderate left pleural effusion. There is minimal residual pleural  thickening.  4. Findings are consistent with treatment response. 5. No evidence of new metastatic disease in the chest, abdomen, or pelvis.  03/18/2021, CT showed no obvious cancer metastasis or recurrence. There are borderline enlarged mediastinal lymph node and a right hilar node.  Attention on follow-up   07/14/2021 CT chest abdomen pelvis with contrast showed no evidence of recurrent or metastatic disease.  Borderline enlarged mediastinal lymph node and hilar lymphadenopathy, attention on follow-up.  Stable  Follow up CT 04/07/22 for restaging with Dr Cathie Hoops. No evidence of recurrent or metastatic disease. Multiple adjacent nodules to spleen, thought to be small accessory splenules, not previously FDG avid. Minimal emphysema. Aortic atherosclerosis and emphysema.   CA 125 has been followed since diagnosis 02/26/2020  53.6 (high)  03/25/2020  39.9  04/15/2020  10.5 (first normal) 05/06/2020  5.8  06/30/2020  4.6 07/22/2020  4.8 08/12/2020  4.3 09/02/2020  4.3 09/23/2020  4.7  10/21/2020  6.1 11/18/2020  6.9 12/16/2020  4.7 01/20/2021  3.4 02/16/2021  4.3 03/16/2021  4.9 05/04/2021  3.9 06/01/2021  3.8  07/13/2021  4.1 08/31/2021  4.7 10/13/2021  5.2 05/11/22 3.6 09/29/22 4.9  GENETIC TEST RESULTS:   Germline Testing: 04/07/20- negative. No clinically significant mutation identified. Breast Cancer Riskscore remaining life time risk- 7%.   Somatic Testing: Genetic testing reported out on 04/08/2020 through the [facility] HRD. No pathogenic variants identified on the [facility] (germline) panel. Pathogenic variant identified through [facility] (HRD testing) in BRCA1 called [id number], HRD positive.    The [facility] gene panel offered by [facility] includes sequencing and deletion/duplication testing of the following 35 genes: APC, ATM, AXIN2, BARD1, BMPR1A, BRCA1, BRCA2, BRIP1, CHD1, CDK4, CDKN2A, CHEK2, EPCAM (large rearrangement only), HOXB13, GALNT12, MLH1,  MSH2, MSH3, MSH6, MUTYH, NBN, NTHL1, PALB2, PMS2, PTEN, RAD51C, RAD51D, RNF43, RPS20, SMAD4, STK11, and TP53.  Sequencing was performed for select regions of POLE and POLD1, and large rearrangement analysis was performed for select regions of GREM1.   Health Maintenance Last Screening Mammogram 11/09/2020. BI-RADS CATEGORY  1: Negative. Repeat screening mammogram in 1 year.   Last Colonoscopy completed 12/10/2020.   Problem List: Patient Active Problem List   Diagnosis Date Noted   Macrocytic anemia 01/26/2023   Hypocalcemia 09/29/2022   Neuropathy 04/09/2022   CKD (chronic kidney disease) stage 3, GFR 30-59 ml/min (HCC) 03/08/2022   Hypokalemia 01/28/2022   Osteopenia 11/18/2021   Screen for colon cancer    Polyp of colon    Normocytic anemia 06/30/2020   Carcinoma of fallopian tube (HCC) 06/25/2020   BRCA1 gene mutation positive 04/11/2020   Genetic testing 04/09/2020   Serous carcinoma of female pelvis (HCC) 03/20/2020   Family history of breast cancer    Encounter for antineoplastic chemotherapy 03/04/2020   Chemotherapy-induced neuropathy (HCC) 03/04/2020   Mass of omentum 02/28/2020   Goals of care, counseling/discussion 02/26/2020   Metastatic carcinoma (HCC) 02/26/2020   Status post thoracentesis    Malignant pleural effusion    Pleural effusion 02/17/2020   Mass of arm, right 01/22/2020   Elevated blood pressure reading 12/28/2019   Hand tingling 12/28/2019   Acute pain of right shoulder 12/01/2018   Adrenal adenoma, left 11/11/2017   Aortic atherosclerosis (HCC) 09/23/2017   Prediabetes 07/09/2017   Ganglion of right wrist 07/06/2017   Hyperlipidemia 10/11/2016   Former smoker 09/21/2016   Depression, recurrent (HCC) 09/14/2016   Headache 09/14/2016   Routine general medical examination at a health care facility 01/14/2015   Screening for breast cancer 11/12/2013    Past Medical History: Past Medical History:  Diagnosis Date   Cancer [facility])    ovarian  cancer   Family history of breast cancer    Genetic testing 04/09/2020   No pathogenic variants identified on the [facility] (germline) panel. The report date is 04/07/2020.   The [facility] gene panel offered by [employer] includes sequencing and deletion/duplication testing of the following 35 genes: APC, ATM, AXIN2, BARD1, BMPR1A, BRCA1, BRCA2, BRIP1, CHD1, CDK4, CDKN2A, CHEK2, EPCAM (large rearrangement only), HOXB13, GALNT12, MLH1, MSH2, MSH3, MSH6   HLD (hyperlipidemia)     Past Surgical History: Past Surgical History:  Procedure Laterality Date   CARPAL TUNNEL RELEASE Left    COLONOSCOPY WITH PROPOFOL N/A 12/10/2020   Procedure: COLONOSCOPY WITH PROPOFOL;  Surgeon: [name], MD;  Location: [facility] ENDOSCOPY;  Service: Endoscopy;  Laterality: N/A;   ROBOTIC ASSISTED TOTAL HYSTERECTOMY WITH BILATERAL SALPINGO OOPHERECTOMY  06/05/2020   with omentectomy tumor debulking via mini-laparotomy/hand assisted port   SHOULDER ARTHROSCOPY WITH SUBACROMIAL DECOMPRESSION AND OPEN ROTATOR C Right 02/01/2020   Procedure: RIGHT SHOULDER ARTHROSCOPIC SUBSCAPULARIS REPAIR, ROTATOR CUFF REPAIR, SUABCROMIAL DECOMPRESSION, AND BICEPS TENODESIS.;  Surgeon: Signa Kell, MD;  Location: [facility] ORS;  Service: Orthopedics;  Laterality: Right;   TUBAL LIGATION      Past Gynecologic History: as per HPI  OB History:  OB History  Gravida Para Term Preterm AB Living  1 1       1   SAB IAB Ectopic Multiple Live Births               # Outcome Date GA Lbr Len/2nd Weight Sex Type Anes PTL Lv  1 Para             Family History: Family History  Problem Relation Age of Onset   Cancer Mother  lymphoma   Stroke Sister    Cancer Brother        lung   Cancer Sister        lung   Lung cancer Brother    Bone cancer Sister    Breast cancer Cousin 68       maternal   Cancer Maternal Aunt        unk types  Family history significant for lung cancer, lymphoma, and breast cancer.    Social History: Social History   Socioeconomic History   Marital status: Single    Spouse name: Not on file   Number of children: Not on file   Years of education: Not on file   Highest education level: Not on file  Occupational History   Not on file  Tobacco Use   Smoking status: Former    Current packs/day: 0.00    Average packs/day: 0.8 packs/day for 40.0 years (30.0 ttl pk-yrs)    Types: Cigarettes    Start date: 02/06/1978    Quit date: 02/06/2018    Years since quitting: 5.1   Smokeless tobacco: Never   Tobacco comments:    quit 02/2018  Vaping Use   Vaping status: Never Used  Substance and Sexual Activity   Alcohol use: Yes    Comment: 2-3 glasses of wine per week,none last 24hrs   Drug use: No   Sexual activity: Not on file  Other Topics Concern   Not on file  Social History Narrative   Lives in [place] alone. Work - [employer]   Diet: Regular   Exercise: walking   Social Determinants of Corporate investment banker Strain: Low Risk  (12/03/2022)   Overall Financial Resource Strain (CARDIA)    Difficulty of Paying Living Expenses: Not hard at all  Food Insecurity: No Food Insecurity (12/03/2022)   Hunger Vital Sign    Worried About Running Out of Food in the Last Year: Never true    Ran Out of Food in the Last Year: Never true  Transportation Needs: No Transportation Needs (12/03/2022)   PRAPARE - Administrator, Civil Service (Medical): No    Lack of Transportation (Non-Medical): No  Physical Activity: Insufficiently Active (12/03/2022)   Exercise Vital Sign    Days of Exercise per Week: 3 days    Minutes of Exercise per Session: 20 min  Stress: No Stress Concern Present (12/03/2022)   [employer] of Occupational Health - Occupational Stress Questionnaire    Feeling of Stress : Not at all  Social Connections: Unknown (12/03/2022)   Social Connection and Isolation Panel [NHANES]    Frequency of Communication with Friends and Family: More  than three times a week    Frequency of Social Gatherings with Friends and Family: More than three times a week    Attends Religious Services: Not on file    Active Member of Clubs or Organizations: Not on file    Attends Banker Meetings: Not on file    Marital Status: Not on file  Intimate Partner Violence: Not At Risk (12/03/2022)   Humiliation, Afraid, Rape, and Kick questionnaire    Fear of Current or Ex-Partner: No    Emotionally Abused: No    Physically Abused: No    Sexually Abused: No   Immunization History  Administered Date(s) Administered   PFIZER Comirnaty(Gray Top)Covid-19 Tri-Sucrose Vaccine 05/08/2020   PFIZER(Purple Top)SARS-COV-2 Vaccination 05/08/2020, 05/29/2020   Tdap 12/24/2011   Allergies: No Known Allergies  Current  Medications: Current Outpatient Medications  Medication Sig Dispense Refill   cholecalciferol (VITAMIN D3) 10 MCG (400 UNIT) TABS tablet Take 800 Units by mouth daily.     cyanocobalamin 1000 MCG tablet Take 400 mcg by mouth daily.     DULoxetine (CYMBALTA) 30 MG capsule TAKE 1 CAPSULE BY MOUTH EVERY DAY 90 capsule 2   pravastatin (PRAVACHOL) 40 MG tablet TAKE 1 TABLET BY MOUTH EVERY DAY IN THE EVENING 90 tablet 3   gabapentin (NEURONTIN) 300 MG capsule Take 1 capsule (300 mg total) by mouth at bedtime. (Patient not taking: Reported on 04/06/2023) 60 capsule 2   olaparib (LYNPARZA) 150 MG tablet Take 2 tablets (300 mg total) by mouth 2 (two) times daily. May take with food to decrease nausea and vomiting. (Patient not taking: Reported on 04/06/2023) 120 tablet 2   No current facility-administered medications for this visit.   Immunization History  Administered Date(s) Administered   PFIZER Comirnaty(Gray Top)Covid-19 Tri-Sucrose Vaccine 05/08/2020   PFIZER(Purple Top)SARS-COV-2 Vaccination 05/08/2020, 05/29/2020   Tdap 12/24/2011   Review of Systems General:  no complaints Skin: no complaints Eyes: no complaints HEENT: no  complaints Breasts: no complaints Pulmonary: no complaints Cardiac: no complaints Gastrointestinal: no complaints Genitourinary/Sexual: no complaints Ob/Gyn: no complaints Musculoskeletal: no complaints Hematology: no complaints Neurologic/Psych: no complaints  Objective:  Physical Examination:  BP (!) 156/81   Pulse 70   Temp (!) 96.9 F (36.1 C)   Resp 20   Wt 208 lb 6.4 oz (94.5 kg)   SpO2 100%   BMI 36.92 kg/m     ECOG Performance Status: 0 - Asymptomatic  GENERAL: Patient is a well appearing female in no acute distress HEENT:  Sclera clear. Anicteric NODES:  Negative axillary, supraclavicular, inguinal lymph node survery LUNGS:  Clear to auscultation bilaterally.   HEART:  Regular rate and rhythm.  ABDOMEN:  Soft, nontender.  No hernias, incisions well healed. No masses or ascites EXTREMITIES:  No peripheral edema. Atraumatic. No cyanosis SKIN:  Clear with no obvious rashes or skin changes.  NEURO:  Nonfocal. Well oriented.  Appropriate affect.  Pelvic: Exam chaperoned by NP  EGBUS: no lesions Cervix: surgically absent Vagina: no lesions, no bleeding Uterus: surgically absent BME: no palpable masses Rectovaginal: deferred   Lab Review As per HPI  Radiologic Imaging: Per HPI    Assessment:  DOMANIQUE QUADERER is a 67 y.o. female diagnosed with stage IV high grade serous fallopian tube cancer based on malignant pleural effusion 7/21. CA 125 at diagnosis was 53. Underwent NACT with paclitaxel/carboplatin for 4 cycles and MIS optimal interval debulking on 06/05/2020.  Completed 3 cycles of adjuvant carboplatin-taxol chemotherapy on 08/12/20, on maintenance olaparib for 2.5 years until 7/24 with Dr Cathie Hoops.  NED on exam today.  CA 125 low and normal at 4.   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  No falls or problems with buttons.   Medical co-morbidities complicating care: Body mass index is 36.92 kg/m. Plan:    Problem List Items Addressed This Visit       Genitourinary   Carcinoma of fallopian tube (HCC) - Primary (Chronic)   Will continue follow up with Dr Cathie Hoops. Suggested to Dr Cathie Hoops that she may want to consider Cymbalta for CIPN.   RTC in 6 months for follow up in Gyn Onc clinic with MD.   Consuello Masse, DNP, AGNP-C [facility] [phone] (clinic)  I personally interviewed and examined  the patient. Agreed with the above/below plan of care. I have directly contributed to assessment and plan of care of this patient and educated and discussed with patient and family.  [name], MD

## 2023-04-21 ENCOUNTER — Ambulatory Visit: Payer: Medicare HMO

## 2023-04-21 ENCOUNTER — Ambulatory Visit
Admission: RE | Admit: 2023-04-21 | Discharge: 2023-04-21 | Disposition: A | Discharge status: Home / Self Care | Payer: Medicare HMO | Source: Ambulatory Visit | Attending: Oncology | Admitting: Oncology

## 2023-04-21 ENCOUNTER — Inpatient Hospital Stay: Payer: Medicare HMO | Attending: Oncology

## 2023-04-21 DIAGNOSIS — Z87891 Personal history of nicotine dependence: Secondary | ICD-10-CM | POA: Insufficient documentation

## 2023-04-21 DIAGNOSIS — Z8544 Personal history of malignant neoplasm of other female genital organs: Secondary | ICD-10-CM | POA: Diagnosis not present

## 2023-04-21 DIAGNOSIS — Z807 Family history of other malignant neoplasms of lymphoid, hematopoietic and related tissues: Secondary | ICD-10-CM | POA: Diagnosis not present

## 2023-04-21 DIAGNOSIS — C5700 Malignant neoplasm of unspecified fallopian tube: Secondary | ICD-10-CM | POA: Diagnosis not present

## 2023-04-21 DIAGNOSIS — Z801 Family history of malignant neoplasm of trachea, bronchus and lung: Secondary | ICD-10-CM | POA: Insufficient documentation

## 2023-04-21 DIAGNOSIS — D3502 Benign neoplasm of left adrenal gland: Secondary | ICD-10-CM | POA: Insufficient documentation

## 2023-04-21 DIAGNOSIS — N183 Chronic kidney disease, stage 3 unspecified: Secondary | ICD-10-CM | POA: Insufficient documentation

## 2023-04-21 DIAGNOSIS — R911 Solitary pulmonary nodule: Secondary | ICD-10-CM | POA: Diagnosis not present

## 2023-04-21 DIAGNOSIS — Z79899 Other long term (current) drug therapy: Secondary | ICD-10-CM | POA: Insufficient documentation

## 2023-04-21 DIAGNOSIS — Z803 Family history of malignant neoplasm of breast: Secondary | ICD-10-CM | POA: Diagnosis not present

## 2023-04-21 DIAGNOSIS — Z808 Family history of malignant neoplasm of other organs or systems: Secondary | ICD-10-CM | POA: Insufficient documentation

## 2023-04-21 DIAGNOSIS — I700 Atherosclerosis of aorta: Secondary | ICD-10-CM | POA: Insufficient documentation

## 2023-04-21 DIAGNOSIS — G629 Polyneuropathy, unspecified: Secondary | ICD-10-CM | POA: Diagnosis not present

## 2023-04-21 LAB — CMP (CANCER CENTER ONLY)
ALT: 18 U/L (ref 0–44)
AST: 20 U/L (ref 15–41)
Albumin: 4.2 g/dL (ref 3.5–5.0)
Alkaline Phosphatase: 48 U/L (ref 38–126)
Anion gap: 8 (ref 5–15)
BUN: 14 mg/dL (ref 8–23)
CO2: 25 mmol/L (ref 22–32)
Calcium: 9.2 mg/dL (ref 8.9–10.3)
Chloride: 105 mmol/L (ref 98–111)
Creatinine: 0.67 mg/dL (ref 0.44–1.00)
GFR, Estimated: >60 mL/min (ref >60)
Glucose, Bld: 101 mg/dL — ABNORMAL HIGH (ref 70–99)
Potassium: 3.4 mmol/L — ABNORMAL LOW (ref 3.5–5.1)
Sodium: 138 mmol/L (ref 135–145)
Total Bilirubin: 0.7 mg/dL (ref 0.3–1.2)
Total Protein: 7.4 g/dL (ref 6.5–8.1)

## 2023-04-21 LAB — CBC WITH DIFFERENTIAL (CANCER CENTER ONLY)
Abs Immature Granulocytes: 0.01 10*3/uL (ref 0.00–0.07)
Basophils Absolute: 0 10*3/uL (ref 0.0–0.1)
Basophils Relative: 1 %
Eosinophils Absolute: 0.1 10*3/uL (ref 0.0–0.5)
Eosinophils Relative: 2 %
HCT: 38.8 % (ref 36.0–46.0)
Hemoglobin: 12.5 g/dL (ref 12.0–15.0)
Immature Granulocytes: 0 %
Lymphocytes Relative: 39 %
Lymphs Abs: 1.7 10*3/uL (ref 0.7–4.0)
MCH: 32 pg (ref 26.0–34.0)
MCHC: 32.2 g/dL (ref 30.0–36.0)
MCV: 99.2 fL (ref 80.0–100.0)
Monocytes Absolute: 0.5 10*3/uL (ref 0.1–1.0)
Monocytes Relative: 10 %
Neutro Abs: 2.1 10*3/uL (ref 1.7–7.7)
Neutrophils Relative %: 48 %
Platelet Count: 156 10*3/uL (ref 150–400)
RBC: 3.91 MIL/uL (ref 3.87–5.11)
RDW: 13.2 % (ref 11.5–15.5)
WBC Count: 4.4 10*3/uL (ref 4.0–10.5)
nRBC: 0 % (ref 0.0–0.2)

## 2023-04-21 MED ORDER — BARIUM SULFATE 2 % PO SUSP
450.0000 mL | ORAL | Status: AC
  Administered 2023-04-21 (×2): 450 mL via ORAL

## 2023-04-21 MED ORDER — IOHEXOL 300 MG/ML  SOLN
100.0000 mL | Freq: Once | INTRAMUSCULAR | Status: AC | PRN
  Administered 2023-04-21: 100 mL via INTRAVENOUS

## 2023-04-22 LAB — CA 125: Cancer Antigen (CA) 125: 3.5 U/mL (ref 0.0–38.1)

## 2023-04-28 ENCOUNTER — Encounter: Payer: Self-pay | Admitting: Oncology

## 2023-04-28 ENCOUNTER — Inpatient Hospital Stay (HOSPITAL_BASED_OUTPATIENT_CLINIC_OR_DEPARTMENT_OTHER): Payer: Medicare HMO | Admitting: Oncology

## 2023-04-28 VITALS — BP 118/78 | HR 70 | Temp 96.0°F | Resp 18 | Wt 202.9 lb

## 2023-04-28 DIAGNOSIS — Z801 Family history of malignant neoplasm of trachea, bronchus and lung: Secondary | ICD-10-CM | POA: Diagnosis not present

## 2023-04-28 DIAGNOSIS — Z5111 Encounter for antineoplastic chemotherapy: Secondary | ICD-10-CM | POA: Diagnosis not present

## 2023-04-28 DIAGNOSIS — Z87891 Personal history of nicotine dependence: Secondary | ICD-10-CM | POA: Diagnosis not present

## 2023-04-28 DIAGNOSIS — Z79899 Other long term (current) drug therapy: Secondary | ICD-10-CM | POA: Diagnosis not present

## 2023-04-28 DIAGNOSIS — N183 Chronic kidney disease, stage 3 unspecified: Secondary | ICD-10-CM | POA: Diagnosis not present

## 2023-04-28 DIAGNOSIS — N1831 Chronic kidney disease, stage 3a: Secondary | ICD-10-CM | POA: Diagnosis not present

## 2023-04-28 DIAGNOSIS — G629 Polyneuropathy, unspecified: Secondary | ICD-10-CM | POA: Diagnosis not present

## 2023-04-28 DIAGNOSIS — Z808 Family history of malignant neoplasm of other organs or systems: Secondary | ICD-10-CM | POA: Diagnosis not present

## 2023-04-28 DIAGNOSIS — C5700 Malignant neoplasm of unspecified fallopian tube: Secondary | ICD-10-CM | POA: Diagnosis not present

## 2023-04-28 DIAGNOSIS — Z803 Family history of malignant neoplasm of breast: Secondary | ICD-10-CM | POA: Diagnosis not present

## 2023-04-28 DIAGNOSIS — Z807 Family history of other malignant neoplasms of lymphoid, hematopoietic and related tissues: Secondary | ICD-10-CM | POA: Diagnosis not present

## 2023-04-28 NOTE — Progress Notes (Signed)
Hematology/Oncology Progress note [phone] [phone]) [phone]   REASON FOR VISIT Follow up for High grade serous adenocarcinoma.    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 S/p 4 cycles of neoadjuvant chemotherapy  Carboplatin /Taxol  S/p total hysterectomy with salpingo-oophorectomy and omentectomy. Optimal (R<1) interval tumor debulking, -followed by 3 cycles of adjuvant carboplatin/Taxol. S/p 2.5 years of Olarparib 300mg  daily.  Labs reviewed and discussed with patient.  CA125 is stable. CT scan showed no evidence of cancer recurrence.  Encounter for antineoplastic chemotherapy Continue current chemotherapy treatments.  CKD (chronic kidney disease) stage 3, GFR 30-59 ml/min (HCC) Encourage oral hydration and avoid nephrotoxins.     Neuropathy She did not tolerate Lyrica to 100 mg twice daily.   Previously self discontinued gabapentin 300mg  QHS and Cymbalta 30mg  daily.  She willing to try Cymbalta 30mg  daily again  Hypocalcemia Recommend calcium and vitamin D supplementation.     Orders Placed This Encounter  Procedures   CT CHEST ABDOMEN PELVIS W CONTRAST    Standing Status:   Future    Standing Expiration Date:   04/27/2024    Order Specific Question:   If indicated for the ordered procedure, I authorize the administration of contrast media per Radiology protocol    Answer:   Yes    Order Specific Question:   Does the patient have a contrast media/X-ray dye allergy?    Answer:   No    Order Specific Question:   Preferred imaging location?     Answer:   [facility]    Order Specific Question:   If indicated for the ordered procedure, I authorize the administration of oral contrast media per Radiology protocol    Answer:   Yes   CBC with Differential (Cancer Center Only)    Standing Status:   Future    Standing Expiration Date:   04/27/2024   CMP (Cancer Center only)    Standing Status:   Future    Standing Expiration Date:   04/27/2024   CA 125    Standing Status:   Future    Standing Expiration Date:   04/27/2024   Follow up in 3 months  All questions were answered. The patient knows to call the clinic with any problems, questions or concerns.  [name], MD, PhD [facility] Hematology Oncology 04/28/2023    PERTINENT ONCOLOGY HISTORY Linda Schmitt is a 67 y.o.afemale who has above oncology history reviewed by me presents for follow up of carcinoma of fallopian tube   Oncology History  Carcinoma of fallopian tube (HCC)  02/18/2020 Cancer Staging    -status post thoracentesis x2 during her recent admission. Cytology was positive for metastatic carcinoma.  TTF-1 Napsin A, GATA3, CDX2 were negative. Positive for PAX 8, which is most often expressing tumors of gynecological and renal origin Staging form: Ovary, Fallopian Tube, and Primary Peritoneal Carcinoma, AJCC 8th Edition - Pathologic stage from 02/18/2020: FIGO Stage IVA (pM1a) - Signed by [name], MD on 08/12/2020   02/25/2020 Imaging   PET scan initial staging 1. Exam shows evidence of peritoneal disease within the abdomen including FDG avid soft tissue infiltration into the omentum.  2.  Large, loculated left pleural effusion with thin, FDG avid rind of soft tissue overlying the left lung. This corresponds to known, pathology proven malignant pleural effusion. 3. Small right pleural effusion with mild FDG uptake is equivocal for malignant effusion of the right hemithorax. 4. Fluid density structure within right side of pelvis abutting the right-side of the  uterus is noted. This is indeterminate. Further investigation with pelvic sonogram may be helpful.   03/04/2020 - 05/06/2020 Chemotherapy   4 cycles of neoadjuvant carbo and Taxol   04/24/2020 Imaging   CT chest abdomen pelvis with contrast showed 1. Moderate left malignant pleural effusion, decreased in size compared to the prior examination. 2. Omental caking highly concerning for intraperitoneal metastatic disease. 3. No definite primary malignancy confidently identified on today's examination. 4. Stable nodule in the lateral limb of the left adrenal gland dating back to at least 2017, presumably a benign lesions such as an adrenal adenoma.5. Aortic atherosclerosis.6. Additional incidental findings, as above.     05/21/2020 Imaging   CT chest with contrast 1. Small left pleural effusion similar to prior CT. Overall no significant interval change compared to the prior CT. 2. Stable left adrenal nodule. 3. Aortic Atherosclerosis (ICD10-I70.0).   06/05/2020 Procedure    status post Total hysterectomy and bilateral salpingo-oophorectomy, Omentectomy, and Optimal (R<1) interval tumor debulking. A. Peritoneal nodule, excisional biopsy: Fibroadipose tissue with calcifications, negative for tumor. B. Uterus, bilateral ovaries and fallopian tubes, hysterectomy and bilateral salpingo-oophorectomy: Cervix: Squamous metaplasia. Endometrium: Inactive endometrium. Myometrium: Adenomyosis. Serosa: High grade serous adenocarcinoma. Right ovary: High grade serous adenocarcinoma, involving the ovarian surface and parenchyma. Right fallopian tube: No specific pathologic change. Left ovary: High grade serous adenocarcinoma, involving the ovarian surface and parenchyma. Left fallopian tube: High grade serous adenocarcinoma, involving tubal fimbria. Serous tubal intraepithelial carcinoma. Immunohistochemistry was performed on block B14 at [facility] to characterize the pathologic process and  demonstrates the following immunophenotype in the cells of interest:POSITIVE:  p53, p16 (strong, patchy), Ki67 (elevated) This staining profile supports the above diagnosis. C. Peritoneal nodule, left pericolic gutter, excisional biopsy: High grade serous adenocarcinoma. D. Cul-de-sac, excisional biopsy: High grade serous adenocarcinoma. E. Omentum, excision: High grade serous adenocarcinoma (up to 3.0 cm).   TUMOR   Tumor Site:    Left fallopian tube   Histologic Type:    Serous carcinoma   Histologic Grade:    High grade   Tumor Size:    Greatest Dimension (Centimeters): 0.2 cm   Ovarian Surface Involvement:    Present     Laterality:    Bilateral   Fallopian Tube Surface Involvement:    Present     Laterality:    Left   Implants:    Present (sites): left peritoneum, cul-de-sac, omentum   Other Tissue / Organ Involvement:    Right ovary   Other Tissue / Organ Involvement:    Left ovary   Other Tissue / Organ Involvement:    Left fallopian tube   Other Tissue / Organ Involvement:    Pelvic peritoneum   Other Tissue / Organ Involvement:    Omentum   Largest Extrapelvic Peritoneal Focus:    Macroscopic (greater than 2 cm)   Peritoneal / Ascitic Fluid:    Not submitted / unknown   Pleural Fluid:    Not submitted / unknown   Treatment Effect:    Moderate response identified (CRS 2)   LYMPH NODES   Regional Lymph Nodes:    No lymph nodes submitted or found  PATHOLOGIC STAGE CLASSIFICATION (pTNM, AJCC 8th Edition)   TNM Descriptors:    y (post-treatment)   Primary Tumor (pT):    pT3c   Regional Lymph Nodes (pN):    pNX  FIGO Stage:    IIIC   + pleural effusion so STAGE IV No germline BRCA mutation, -Homologous recombination deficiency positive.  Myriad Genomic instability score:  positive, somatic BRCA1 positive   06/25/2020 Initial Diagnosis   Carcinoma of fallopian tube (HCC)   07/01/2020 - 08/12/2020 Chemotherapy   3 cycles of adjuvant chemotherapy with carboplatin AUC 5,  Taxol 175 mg/m.     09/02/2020 -  Chemotherapy   started on olaparib 300 mg twice daily.   11/16/2020 Imaging   CT chest abdomen pelvis with contrast showed 1. Status post interval hysterectomy and bilateral salpingectomy. 2. Status post interval omentectomy. There is no residual suspicious peritoneal nodularity identified. Unchanged small nodules adjacent to the spleen which are stable compared to examinations dating back to 2017 and likely small splenules and/or lymph nodes. Attention on follow-up 3. Interval resolution of a previously seen moderate left pleural effusion. There is minimal residual pleural thickening. 4. Findings are consistent with treatment response.5. No evidence of new metastatic disease in the chest, abdomen, or pelvis.   Aortic Atherosclerosis (ICD10-I70.0).   03/18/2021 Imaging   CT chest abdomen pelvis with contrast showed Chest Impression: 1. No evidence metastatic disease in the thorax. 2. Stable borderline enlarged mediastinal lymph nodes and RIGHT hilar node. Recommend attention on follow-up. Abdomen / Pelvis Impression: 1. No evidence of peritoneal or omental nodal metastatic recurrence. 2. No evidence of local recurrence in the pelvis. 3. No free fluid in the abdomen pelvis.    07/14/2021 Imaging   CT chest abdomen pelvis with contrast showed 1. No evidence of new or progressive disease within the chest,abdomen, or pelvis. 2. Stable borderline enlarged mediastinal and right hilar lymph nodes, nonspecific and possibly reactive. Recommend attention on follow-up imaging. 3. Moderate volume of formed stool throughout the colon suggestive of constipation. 4. Stable 1.4 cm left adrenal nodule previously characterized as an adenoma on PET-CT February 25, 2020. 5.  Aortic Atherosclerosis (ICD10-I70.0).   08/12/2021 Imaging   CT chest abdomen pelvis wo contrast  1. No evidence of new metastatic disease. 2. Left adrenal adenoma. 3.  Aortic atherosclerosis  (ICD10-I70.0). 4.  Emphysema (ICD10-J43.9).   11/24/2021 Imaging   CT chest abdomen pelvis with contrast 1. No new or progressive findings in the chest, abdomen or pelvis.2. Scattered small perisplenic nodules are unchanged potentially small splenules. 3. Stable LEFT adrenal lesion, unchanged dating back to October of 2017. This likely represents an adenoma. 4. Post hysterectomy.5. Aortic atherosclerosis.   04/07/2022 Imaging   CT chest abdomen pelvis without contrast 1. Status post hysterectomy, oophorectomy, and omentectomy. No noncontrast evidence of recurrent or metastatic disease in the chest, abdomen, or pelvis. 2. Multiple small nodules adjacent to the spleen in the left upper quadrant, measuring up to 0.9 x 0.7 cm. These are again most likely small accessory splenules, not previously FDG avid. Attention on follow-up. 3. Minimal emphysema, diffuse bilateral bronchial wall thickening, and background of very fine centrilobular nodules, most concentrated in the lung apices, consistent with smoking-related respiratory bronchiolitis.  Aortic Atherosclerosis (ICD10-I70.0) and Emphysema (ICD10-J43.9).   12/22/2022 Imaging   CT chest abdomen pelvis without contrast showed 1. Subtle areas of new bilateral peribronchovascular ground-glass and ground-glass nodularity, suspicious for mild infection, including atypical etiologies. 2. No evidence of metastatic disease, status post hysterectomy. 3.  Left adrenal nodule which is similar back to 2021 PET and can be presumed benign. Likely a lipid poor adenoma. No specific follow-up indicated. 4.  Aortic Atherosclerosis (ICD10-I70.0).     04/21/2023 Imaging   CT chest abdomen pelvis w contrast  1. Stable examination without evidence of local recurrence or metastatic disease in the chest, abdomen or pelvis. 2. Stable 2 mm pulmonary nodule in the posterior left upper lobe,considered benign. 3. Stable 16 mm presumed lipid poor left adrenal adenoma. 4.   Aortic Atherosclerosis (ICD10-I70.0).      INTERVAL HISTORY 67 year old female presents for follow-up of stage IV ovarian cancer,  somatic BRCA1 positive and HRD positive.  Patient has been on olaparib since January 2022.   Overall patient tolerates well denies any nausea vomiting diarrhea.  She feels well.   + Neuropathy, numbness of her toes, symptoms are stable or improved.  She is interested in trying Cymbalta again.  She has intentional weight loss due to her new job being more active.  Review of Systems  Constitutional:  Negative for appetite change, chills, fatigue and fever.  HENT:   Negative for hearing loss and voice change.   Eyes:  Negative for eye problems.  Respiratory:  Negative for chest tightness and cough.   Cardiovascular:  Negative for chest pain.  Gastrointestinal:  Negative for abdominal distention, abdominal pain and blood in stool.  Endocrine: Negative for hot flashes.  Genitourinary:  Negative for difficulty urinating and frequency.   Musculoskeletal:  Negative for arthralgias.  Skin:  Negative for itching and rash.  Neurological:  Positive for numbness. Negative for extremity weakness.  Hematological:  Negative for adenopathy.  Psychiatric/Behavioral:  Positive for sleep disturbance. Negative for confusion.         No Known Allergies   Past Medical History:  Diagnosis Date   Cancer (HCC)    ovarian cancer   Family history of breast cancer    Genetic testing 04/09/2020   No pathogenic variants identified on the Myriad MyRisk (germline) panel. The report date is 04/07/2020.   The [facility] gene panel offered by [employer] includes sequencing and deletion/duplication testing of the following 35 genes: APC, ATM, AXIN2, BARD1, BMPR1A, BRCA1, BRCA2, BRIP1, CHD1, CDK4, CDKN2A, CHEK2, EPCAM (large rearrangement only), HOXB13, GALNT12, MLH1, MSH2, MSH3, MSH6   HLD (hyperlipidemia)      Past Surgical History:  Procedure Laterality Date    CARPAL TUNNEL RELEASE Left    COLONOSCOPY WITH PROPOFOL N/A 12/10/2020   Procedure: COLONOSCOPY WITH PROPOFOL;  Surgeon: [name], MD;  Location: [facility] END[facility]COPY;  Service: Endoscopy;  Laterality: N/A;   ROBOTIC ASSISTED TOTAL HYSTERECTOMY WITH BILATERAL SALPINGO OOPHERECTOMY  06/05/2020   with omentectomy tumor debulking via mini-laparotomy/hand assisted port   SHOULDER ARTHROSCOPY WITH SUBACROMIAL DECOMPRESSION AND OPEN ROTATOR C Right 02/01/2020   Procedure: RIGHT SHOULDER ARTHROSCOPIC SUBSCAPULARIS REPAIR, ROTATOR CUFF REPAIR, SUABCROMIAL DECOMPRESSION, AND BICEPS TENODESIS.;  Surgeon: Signa Kell, MD;  Location: [facility] ORS;  Service: Orthopedics;  Laterality: Right;   TUBAL LIGATION      Social History   Socioeconomic History   Marital status: Single    Spouse name: Not on file   Number of children: Not on file   Years of education: Not on file   Highest education level: Not on file  Occupational History   Not on file  Tobacco Use   Smoking status: Former    Current packs/day: 0.00    Average packs/day: 0.8 packs/day for 40.0 years (30.0 ttl pk-yrs)  Types: Cigarettes    Start date: 02/06/1978    Quit date: 02/06/2018    Years since quitting: 5.2   Smokeless tobacco: Never   Tobacco comments:    quit 02/2018  Vaping Use   Vaping status: Never Used  Substance and Sexual Activity   Alcohol use: Yes    Comment: 2-3 glasses of wine per week,none last 24hrs   Drug use: No   Sexual activity: Not on file  Other Topics Concern   Not on file  Social History Narrative   Lives in [place] alone. Work - [employer]   Diet: Regular   Exercise: walking   Social Determinants of Corporate investment banker Strain: Low Risk  (12/03/2022)   Overall Financial Resource Strain (CARDIA)    Difficulty of Paying Living Expenses: Not hard at all  Food Insecurity: No Food Insecurity (12/03/2022)   Hunger Vital Sign    Worried About Running Out of Food in the Last Year: Never true     Ran Out of Food in the Last Year: Never true  Transportation Needs: No Transportation Needs (12/03/2022)   PRAPARE - Administrator, Civil Service (Medical): No    Lack of Transportation (Non-Medical): No  Physical Activity: Insufficiently Active (12/03/2022)   Exercise Vital Sign    Days of Exercise per Week: 3 days    Minutes of Exercise per Session: 20 min  Stress: No Stress Concern Present (12/03/2022)   [employer] of Occupational Health - Occupational Stress Questionnaire    Feeling of Stress : Not at all  Social Connections: Unknown (12/03/2022)   Social Connection and Isolation Panel [NHANES]    Frequency of Communication with Friends and Family: More than three times a week    Frequency of Social Gatherings with Friends and Family: More than three times a week    Attends Religious Services: Not on file    Active Member of Clubs or Organizations: Not on file    Attends Banker Meetings: Not on file    Marital Status: Not on file  Intimate Partner Violence: Not At Risk (12/03/2022)   Humiliation, Afraid, Rape, and Kick questionnaire    Fear of Current or Ex-Partner: No    Emotionally Abused: No    Physically Abused: No    Sexually Abused: No    Family History  Problem Relation Age of Onset   Cancer Mother        lymphoma   Stroke Sister    Cancer Brother        lung   Cancer Sister        lung   Lung cancer Brother    Bone cancer Sister    Breast cancer Cousin 45       maternal   Cancer Maternal Aunt        unk types     Current Outpatient Medications:    cholecalciferol (VITAMIN D3) 10 MCG (400 UNIT) TABS tablet, Take 800 Units by mouth daily., Disp: , Rfl:    cyanocobalamin 1000 MCG tablet, Take 400 mcg by mouth daily., Disp: , Rfl:    DULoxetine (CYMBALTA) 30 MG capsule, TAKE 1 CAPSULE BY MOUTH EVERY DAY, Disp: 90 capsule, Rfl: 2   pravastatin (PRAVACHOL) 40 MG tablet, TAKE 1 TABLET BY MOUTH EVERY DAY IN THE EVENING, Disp: 90  tablet, Rfl: 3   gabapentin (NEURONTIN) 300 MG capsule, Take 1 capsule (300 mg total) by mouth at bedtime. (Patient not taking: Reported on 04/06/2023),  Disp: 60 capsule, Rfl: 2  Physical exam:  Vitals:   04/28/23 1038  BP: 118/78  Pulse: 70  Resp: 18  Temp: (!) 96 F (35.6 C)  TempSrc: Tympanic  SpO2: 97%  Weight: 202 lb 14.4 oz (92 kg)   Physical Exam Constitutional:      General: She is not in acute distress.    Appearance: She is not diaphoretic.  HENT:     Head: Normocephalic.  Eyes:     General: No scleral icterus. Cardiovascular:     Rate and Rhythm: Normal rate and regular rhythm.     Heart sounds: No murmur heard. Pulmonary:     Effort: Pulmonary effort is normal. No respiratory distress.     Breath sounds: No wheezing.  Chest:     Chest wall: No tenderness.  Abdominal:     General: Bowel sounds are normal. There is no distension.     Palpations: Abdomen is soft.  Musculoskeletal:        General: Normal range of motion.     Cervical back: Normal range of motion and neck supple.  Skin:    General: Skin is warm and dry.     Findings: No erythema.  Neurological:     Mental Status: She is alert and oriented to person, place, and time. Mental status is at baseline.     Cranial Nerves: No cranial nerve deficit.     Motor: No abnormal muscle tone.     Coordination: Coordination normal.  Psychiatric:        Mood and Affect: Mood and affect normal.     Labs are reviewed and discussed with patient.    Latest Ref Rng & Units 04/21/2023    9:25 AM 01/18/2023    9:48 AM 11/16/2022    9:49 AM  CBC  WBC 4.0 - 10.5 K/uL 4.4  4.2  4.3   Hemoglobin 12.0 - 15.0 g/dL 16.1  09.6  04.5   Hematocrit 36.0 - 46.0 % 38.8  33.0  34.4   Platelets 150 - 400 K/uL 156  179  155       Latest Ref Rng & Units 04/21/2023    9:25 AM 01/18/2023    9:48 AM 11/16/2022    9:49 AM  CMP  Glucose 70 - 99 mg/dL 409  811  914   BUN 8 - 23 mg/dL 14  10  9    Creatinine 0.44 - 1.00 mg/dL 7.82   9.56  2.13   Sodium 135 - 145 mmol/L 138  136  139   Potassium 3.5 - 5.1 mmol/L 3.4  3.3  3.4   Chloride 98 - 111 mmol/L 105  108  106   CO2 22 - 32 mmol/L 25  22  24    Calcium 8.9 - 10.3 mg/dL 9.2  8.9  8.9   Total Protein 6.5 - 8.1 g/dL 7.4  7.0  7.0   Total Bilirubin 0.3 - 1.2 mg/dL 0.7  0.6  0.8   Alkaline Phos 38 - 126 U/L 48  38  46   AST 15 - 41 U/L 20  17  17    ALT 0 - 44 U/L 18  13  12     RADIOGRAPHIC STUDIES: I have personally reviewed the radiological images as listed and agreed with the findings in the report. CT CHEST ABDOMEN PELVIS W CONTRAST  Result Date: 04/21/2023 CLINICAL DATA:  History of fallopian tube cancer status post hysterectomy and bilateral salpingo-oophorectomy, chemotherapy. Prior omentectomy. Follow-up. *  Tracking Code: BO * EXAM: CT CHEST, ABDOMEN, AND PELVIS WITH CONTRAST TECHNIQUE: Multidetector CT imaging of the chest, abdomen and pelvis was performed following the standard protocol during bolus administration of intravenous contrast. RADIATION DOSE REDUCTION: This exam was performed according to the departmental dose-optimization program which includes automated exposure control, adjustment of the mA and/or kV according to patient size and/or use of iterative reconstruction technique. CONTRAST:  OMNIPAQUE IOHEXOL 300 MG/ML  SOLN COMPARISON:  Multiple priors including most recent CT Dec 22, 2022. FINDINGS: CT CHEST FINDINGS Cardiovascular: Aortic atherosclerosis. No central pulmonary embolus on this nondedicated study. Normal size heart. No significant pericardial effusion/thickening. Mediastinum/Nodes: No pathologically enlarged mediastinal, hilar or axillary lymph nodes. Stable 7 mm prevascular lymph node on image 19/2. The esophagus is grossly unremarkable. Lungs/Pleura: Stable 2 mm pulmonary nodule in the posterior left upper lobe on image 49/3. Scattered areas of ground-glass seen on prior examination have resolved. No new suspicious pulmonary nodules or  masses. No pleural effusion. No pneumothorax. Musculoskeletal: No aggressive lytic or blastic lesion of bone. Fluid in the left subcoracoid bursa on image 8/2 is similar prior. CT ABDOMEN PELVIS FINDINGS Hepatobiliary: No suspicious hepatic lesion. Gallbladder is unremarkable. No biliary ductal dilation. Pancreas: No pancreatic ductal dilation or evidence of acute inflammation. Spleen: No splenomegaly. Adrenals/Urinary Tract: 16 mm left adrenal nodule on image 56/2 is stable dating back to PET-CT 02/25/2020 and was not hypermetabolic on that examination. Right adrenal gland appears normal. No hydronephrosis. Kidneys demonstrate symmetric enhancement. Urinary bladder is unremarkable for degree of distension. Stomach/Bowel: Radiopaque enteric contrast material traverses the rectum. No pathologic dilation of small or large bowel. No evidence of acute bowel inflammation. Vascular/Lymphatic: Normal caliber abdominal aorta. Smooth IVC contours. The portal, splenic and superior mesenteric veins are patent. No pathologically enlarged abdominal or pelvic lymph nodes. Reproductive: Status post hysterectomy. No suspicious adnexal masses. Other: No significant abdominopelvic free fluid. No discrete peritoneal or omental nodularity. Musculoskeletal: No aggressive lytic or blastic lesion of bone. Stable T12 Schmorl's node. IMPRESSION: 1. Stable examination without evidence of local recurrence or metastatic disease in the chest, abdomen or pelvis. 2. Stable 2 mm pulmonary nodule in the posterior left upper lobe, considered benign. 3. Stable 16 mm presumed lipid poor left adrenal adenoma. 4.  Aortic Atherosclerosis (ICD10-I70.0). Electronically Signed   By: Maudry Mayhew M.D.   On: 04/21/2023 16:23

## 2023-04-28 NOTE — Assessment & Plan Note (Signed)
Encourage oral hydration and avoid nephrotoxins.

## 2023-04-28 NOTE — Assessment & Plan Note (Signed)
Continue current chemotherapy treatments.

## 2023-04-28 NOTE — Assessment & Plan Note (Signed)
She did not tolerate Lyrica to 100 mg twice daily.   Previously self discontinued gabapentin 300mg  QHS and Cymbalta 30mg  daily.  She willing to try Cymbalta 30mg  daily again

## 2023-04-28 NOTE — Assessment & Plan Note (Signed)
Recommend calcium and vitamin D supplementation.

## 2023-04-28 NOTE — Assessment & Plan Note (Signed)
+   pleural effusion cytology  -( somatic BRCA1 positive, HRD positive). S/p 4 cycles of neoadjuvant chemotherapy  Carboplatin /Taxol  S/p total hysterectomy with salpingo-oophorectomy and omentectomy. Optimal (R<1) interval tumor debulking, -followed by 3 cycles of adjuvant carboplatin/Taxol. S/p 2.5 years of Olarparib 300mg  daily.  Labs reviewed and discussed with patient.  CA125 is stable. CT scan showed no evidence of cancer recurrence.

## 2023-10-12 ENCOUNTER — Inpatient Hospital Stay: Payer: Medicare HMO | Attending: Obstetrics and Gynecology | Admitting: Obstetrics and Gynecology

## 2023-10-12 VITALS — BP 117/66 | HR 73 | Temp 96.8°F | Resp 19 | Wt 218.7 lb

## 2023-10-12 DIAGNOSIS — Z1501 Genetic susceptibility to malignant neoplasm of breast: Secondary | ICD-10-CM | POA: Diagnosis not present

## 2023-10-12 DIAGNOSIS — Z148 Genetic carrier of other disease: Secondary | ICD-10-CM | POA: Diagnosis not present

## 2023-10-12 DIAGNOSIS — Z90722 Acquired absence of ovaries, bilateral: Secondary | ICD-10-CM | POA: Diagnosis not present

## 2023-10-12 DIAGNOSIS — Z1502 Genetic susceptibility to malignant neoplasm of ovary: Secondary | ICD-10-CM | POA: Insufficient documentation

## 2023-10-12 DIAGNOSIS — Z1509 Genetic susceptibility to other malignant neoplasm: Secondary | ICD-10-CM | POA: Diagnosis not present

## 2023-10-12 DIAGNOSIS — N183 Chronic kidney disease, stage 3 unspecified: Secondary | ICD-10-CM | POA: Insufficient documentation

## 2023-10-12 DIAGNOSIS — Z8544 Personal history of malignant neoplasm of other female genital organs: Secondary | ICD-10-CM | POA: Diagnosis not present

## 2023-10-12 DIAGNOSIS — C5700 Malignant neoplasm of unspecified fallopian tube: Secondary | ICD-10-CM

## 2023-10-12 DIAGNOSIS — Z9079 Acquired absence of other genital organ(s): Secondary | ICD-10-CM | POA: Insufficient documentation

## 2023-10-12 DIAGNOSIS — Z08 Encounter for follow-up examination after completed treatment for malignant neoplasm: Secondary | ICD-10-CM | POA: Diagnosis not present

## 2023-10-12 DIAGNOSIS — Z90710 Acquired absence of both cervix and uterus: Secondary | ICD-10-CM | POA: Diagnosis not present

## 2023-10-12 DIAGNOSIS — G620 Drug-induced polyneuropathy: Secondary | ICD-10-CM | POA: Insufficient documentation

## 2023-10-12 DIAGNOSIS — Z9221 Personal history of antineoplastic chemotherapy: Secondary | ICD-10-CM | POA: Insufficient documentation

## 2023-10-12 NOTE — Progress Notes (Signed)
 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  Subjective:  Linda Schmitt is a 68 y.o. G1P1 female, initially seen in consultation from 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   She had imaging on 04/21/23 with Dr Cathie Hoops- CT C/A/P wo contrast that was negative for recurrent or metastatic disease. Olaparib well without significant side effects, but discontinued in 7/24 after 2.5 years.   Component Ref Range & Units (hover) 5 mo ago (04/21/23) 8 mo ago (01/18/23) 11 mo ago (11/16/22) 1 yr ago (09/29/22) 1 yr ago (08/16/22) 1 yr ago (06/23/22) 1 yr ago (05/11/22)  Cancer Antigen (CA) 125 3.5 4.0 CM 4.0 CM 4.9 CM 4.1 CM 3.7 CM 3.6 CM    Gynecologic Oncology History:  Linda Schmitt is a 68 y.o. female, initially seen in consultation from 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 Her history is as follows:  She presented with malignant pleural effusion two weeks after elective right shoulder surgery. She underwent thoracentesis on 02/18/2020 and 02/19/2020 with 1.8L and 1.2L removed  respectively. Cytology was positive for PAX 8. PET scan showed evidence of peritoneal disease within the abdomen, soft tissue infiltrating into the omentum, large loculated left pleural effusion with thin FDG avid rind of soft tissue overlying the left lung. Small right pleural effusion with mild FDG uptake is equivocal for malignant effusion of the right hemithorax. Fluid density structure within right side of pelvis abutting the right sided uterus noted though indeterminate. Pelvic u/s obtained showed heterogeneous myometrium with poor definition of endometrial complex. Normal left ovary.  No cystic right adnexal lesion is identified.  No a probable small normal-appearing right ovary is identified.  Suspect that the low-attenuation presumed cystic structure seen in the right adnexa on the prior image corresponds to a small amount of nonspecific free fluid in the right adnexa.  03/05/20 CT-GUIDED BIOPSY: HIGH-GRADE SEROUS CARCINOMA.   Given that cytology from left pleural effusion is positive for metastatic carcinoma, tumor cells are positive for PAX 8 which indicates GYN origin.  Given that patient has moderate to large amount of pleural effusion, heavy tumor burden, recommend to start chemotherapy as soon as possible with carboplatin AUC 5-6 and paclitaxel 175 mg/m every 3 weeks for 3 cycles.  Patient will establish care with gynecology oncology for evaluation of participating in clinical trials after neoadjuvant chemo/surgery. CA 125 was 53.6 at time of diagnosis.   Treatment Summary: 03/04/20 Cycle 1 carboplatin-paclitaxel CA 125 53.6 03/25/20 Cycle 2 carboplatin-paclitaxel CA 125 39.9 04/15/20  Cycle 3 carboplatin-paclitaxel CA 125 10.5  04/25/20 CT C/A/P revealed moderate left malignant pleural effusion with some pleural thickening and enhancement which was decreased in size compared to prior exam. Omental caking apparent concerning for intraperitoneal metastatic  disease. No definite primary malignancy  identified. Stable left adrenal nodule. Aortic atherosclerosis.   05/06/2020 Cycle 4 carboplatin-paclitaxel CA125 5.8  On 06/05/2020 she underwent exam under anesthesia, Diagnostic laparoscopy, Robot-assisted total laparoscopic hysterectomy, Bilateral salpingo-oophorectomy, Omentectomy via mini-laparotomy/hand assisted port, and Optimal (R<1) interval tumor debulking. Pathology c/w tubal origin.   Pathology: High grade serous adenocarcinoma involving bilateral ovaries and fallopian tubes; peritoneal nodules, cul-de-sac and omentum (primary site left tube). p53, p16 (strong, patchy), Ki67 (elevated)  11/22/2021Cycle #5 paclitaxel and carboplatin CA125 4.6 07/22/2020 Cycle #6 paclitaxel and carboplatin  CA125 4.16 August 2020 olaparib maintenance initiated  10/29/2020 Patient complaining of pelvic pain/pressure as well as constipation at GYN ONC visit today. CTA ordered and completed on 11/14/2020. Included below.  IMPRESSION: 1. Status post interval hysterectomy and bilateral salpingectomy. 2. Status post interval omentectomy. There is no residual suspicious peritoneal nodularity identified. Unchanged small nodules adjacent to the spleen which are stable compared to examinations dating back to 2017 and likely small splenules and/or lymph nodes. Attention on follow-up  3. Interval resolution of a previously seen moderate left pleural effusion. There is minimal residual pleural thickening.  4. Findings are consistent with treatment response. 5. No evidence of new metastatic disease in the chest, abdomen, or pelvis.  03/18/2021, CT showed no obvious cancer metastasis or recurrence. There are borderline enlarged mediastinal lymph node and a right hilar node.  Attention on follow-up   07/14/2021 CT chest abdomen pelvis with contrast showed no evidence of recurrent or metastatic disease.  Borderline enlarged mediastinal lymph node and hilar lymphadenopathy, attention on follow-up.  Stable  Follow  up CT 04/07/22 for restaging with Dr Cathie Hoops. No evidence of recurrent or metastatic disease. Multiple adjacent nodules to spleen, thought to be small accessory splenules, not previously FDG avid. Minimal emphysema. Aortic atherosclerosis and emphysema.    CT 12/22/22  IMPRESSION: 1. Subtle areas of new bilateral peribronchovascular ground-glass and ground-glass nodularity, suspicious for mild infection, including atypical etiologies. 2. No evidence of metastatic disease, status post hysterectomy. 3. Left adrenal nodule which is similar back to 2021 PET and can be presumed benign. Likely a lipid poor adenoma. No specific follow-up indicated. 4.  Aortic Atherosclerosis (ICD10-I70.0).  04/21/23 CT CAP IMPRESSION: 1. Stable examination without evidence of local recurrence or metastatic disease in the chest, abdomen or pelvis. 2. Stable 2 mm pulmonary nodule in the posterior left upper lobe, considered benign. 3. Stable 16 mm presumed lipid poor left adrenal adenoma. 4.  Aortic Atherosclerosis (ICD10-I70.0).    CA 125 has been followed since diagnosis 02/26/2020  53.6 (high)  03/25/2020  39.9  04/15/2020  10.5 (first normal) 05/06/2020  5.8  06/30/2020  4.6 07/22/2020  4.8 08/12/2020  4.3 09/02/2020  4.3 09/23/2020  4.7  10/21/2020  6.1 11/18/2020  6.9 12/16/2020  4.7 01/20/2021  3.4 02/16/2021  4.3 03/16/2021  4.9 05/04/2021  3.9 06/01/2021  3.8  07/13/2021  4.1 08/31/2021  4.7 10/13/2021  5.2 05/11/22 3.6 09/29/22 4.9 01/18/23 4.0 04/21/23 3.5    GENETIC TEST RESULTS:   Germline Testing: 04/07/20- negative. No clinically significant mutation identified. Breast Cancer Riskscore remaining life time risk- 7%.   Somatic Testing: Genetic testing reported out on 04/08/2020 through the [facility] + [facility] HRD. No pathogenic variants identified on the [facility] (germline) panel. Pathogenic variant identified through [facility] (HRD testing) in BRCA1 called [id number], HRD positive.     The [facility] gene panel offered by [facility] includes sequencing and deletion/duplication testing of the following 35  genes: APC, ATM, AXIN2, BARD1, BMPR1A, BRCA1, BRCA2, BRIP1, CHD1, CDK4, CDKN2A, CHEK2, EPCAM (large rearrangement only), HOXB13, GALNT12, MLH1, MSH2, MSH3, MSH6, MUTYH, NBN, NTHL1, PALB2, PMS2, PTEN, RAD51C, RAD51D, RNF43, RPS20, SMAD4, STK11, and TP53. Sequencing was performed for select regions of POLE and POLD1, and large rearrangement analysis was performed for select regions of GREM1.   Health Maintenance Last Screening Mammogram 11/09/2020. BI-RADS CATEGORY  1: Negative. Repeat screening mammogram in 1 year.   Last Colonoscopy completed 12/10/2020.   Problem List: Patient Active Problem List   Diagnosis Date Noted   Macrocytic anemia 01/26/2023   Hypocalcemia 09/29/2022   Neuropathy 04/09/2022   CKD (chronic kidney disease) stage 3, GFR 30-59 ml/min (HCC) 03/08/2022   Hypokalemia 01/28/2022   Osteopenia 11/18/2021   Screen for colon cancer    Polyp of colon    Normocytic anemia 06/30/2020   Carcinoma of fallopian tube (HCC) 06/25/2020   BRCA1 gene mutation positive 04/11/2020   Genetic testing 04/09/2020   Serous carcinoma of female pelvis (HCC) 03/20/2020   Family history of breast cancer    Encounter for antineoplastic chemotherapy 03/04/2020   Chemotherapy-induced neuropathy (HCC) 03/04/2020   Mass of omentum 02/28/2020   Goals of care, counseling/discussion 02/26/2020   Metastatic carcinoma (HCC) 02/26/2020   Status post thoracentesis    Malignant pleural effusion    Pleural effusion 02/17/2020   Mass of arm, right 01/22/2020   Elevated blood pressure reading 12/28/2019   Hand tingling 12/28/2019   Acute pain of right shoulder 12/01/2018   Adrenal adenoma, left 11/11/2017   Aortic atherosclerosis (HCC) 09/23/2017   Prediabetes 07/09/2017   Ganglion of right wrist 07/06/2017   Hyperlipidemia 10/11/2016   Former smoker 09/21/2016    Depression, recurrent (HCC) 09/14/2016   Headache 09/14/2016   Routine general medical examination at a health care facility 01/14/2015   Screening for breast cancer 11/12/2013    Past Medical History: Past Medical History:  Diagnosis Date   Cancer [facility])    ovarian cancer   Family history of breast cancer    Genetic testing 04/09/2020   No pathogenic variants identified on the [facility] (germline) panel. The report date is 04/07/2020.   The [facility] gene panel offered by [employer] includes sequencing and deletion/duplication testing of the following 35 genes: APC, ATM, AXIN2, BARD1, BMPR1A, BRCA1, BRCA2, BRIP1, CHD1, CDK4, CDKN2A, CHEK2, EPCAM (large rearrangement only), HOXB13, GALNT12, MLH1, MSH2, MSH3, MSH6   HLD (hyperlipidemia)     Past Surgical History: Past Surgical History:  Procedure Laterality Date   CARPAL TUNNEL RELEASE Left    COLONOSCOPY WITH PROPOFOL N/A 12/10/2020   Procedure: COLONOSCOPY WITH PROPOFOL;  Surgeon: [name], MD;  Location: [facility] ENDOSCOPY;  Service: Endoscopy;  Laterality: N/A;   ROBOTIC ASSISTED TOTAL HYSTERECTOMY WITH BILATERAL SALPINGO OOPHERECTOMY  06/05/2020   with omentectomy tumor debulking via mini-laparotomy/hand assisted port   SHOULDER ARTHROSCOPY WITH SUBACROMIAL DECOMPRESSION AND OPEN ROTATOR C Right 02/01/2020   Procedure: RIGHT SHOULDER ARTHROSCOPIC SUBSCAPULARIS REPAIR, ROTATOR CUFF REPAIR, SUABCROMIAL DECOMPRESSION, AND BICEPS TENODESIS.;  Surgeon: Signa Kell, MD;  Location: [facility] ORS;  Service: Orthopedics;  Laterality: Right;   TUBAL LIGATION      Past Gynecologic History: as per HPI  OB History:  OB History  Gravida Para Term Preterm AB Living  1 1    1   SAB IAB Ectopic Multiple Live Births          # Outcome Date GA Lbr Len/2nd Weight Sex Type Anes PTL Lv  1 Para             Family History: Family History  Problem Relation Age of Onset   Cancer Mother        lymphoma   Stroke Sister     Cancer Brother        lung   Cancer Sister        lung   Lung cancer Brother    Bone cancer Sister    Breast cancer Cousin 45       maternal   Cancer Maternal Aunt        unk types  Family history significant for lung cancer, lymphoma, and breast cancer.   Social History: Social History   Socioeconomic History   Marital status: Single    Spouse name: Not on file   Number of children: Not on file   Years of education: Not on file   Highest education level: Not on file  Occupational History   Not on file  Tobacco Use   Smoking status: Former    Current packs/day: 0.00    Average packs/day: 0.8 packs/day for 40.0 years (30.0 ttl pk-yrs)    Types: Cigarettes    Start date: 02/06/1978    Quit date: 02/06/2018    Years since quitting: 5.6   Smokeless tobacco: Never   Tobacco comments:    quit 02/2018  Vaping Use   Vaping status: Never Used  Substance and Sexual Activity   Alcohol use: Yes    Comment: 2-3 glasses of wine per week,none last 24hrs   Drug use: No   Sexual activity: Not on file  Other Topics Concern   Not on file  Social History Narrative   Lives in [place] alone. Work - [employer]   Diet: Regular   Exercise: walking   Social Drivers of Corporate investment banker Strain: Low Risk  (12/03/2022)   Overall Financial Resource Strain (CARDIA)    Difficulty of Paying Living Expenses: Not hard at all  Food Insecurity: No Food Insecurity (12/03/2022)   Hunger Vital Sign    Worried About Running Out of Food in the Last Year: Never true    Ran Out of Food in the Last Year: Never true  Transportation Needs: No Transportation Needs (12/03/2022)   PRAPARE - Administrator, Civil Service (Medical): No    Lack of Transportation (Non-Medical): No  Physical Activity: Insufficiently Active (12/03/2022)   Exercise Vital Sign    Days of Exercise per Week: 3 days    Minutes of Exercise per Session: 20 min  Stress: No Stress Concern Present (12/03/2022)   [employer] of Occupational Health - Occupational Stress Questionnaire    Feeling of Stress : Not at all  Social Connections: Unknown (12/03/2022)   Social Connection and Isolation Panel [NHANES]    Frequency of Communication with Friends and Family: More than three times a week    Frequency of Social Gatherings with Friends and Family: More than three times a week    Attends Religious Services: Not on file    Active Member of Clubs or Organizations: Not on file    Attends Banker Meetings: Not on file    Marital Status: Not on file  Intimate Partner Violence: Not At Risk (12/03/2022)   Humiliation, Afraid, Rape, and Kick questionnaire    Fear of Current or Ex-Partner: No    Emotionally Abused: No    Physically Abused: No    Sexually Abused: No  Immunization History  Administered Date(s) Administered   PFIZER Comirnaty(Gray Top)Covid-19 Tri-Sucrose Vaccine 05/08/2020   PFIZER(Purple Top)SARS-COV-2 Vaccination 05/08/2020, 05/29/2020   Tdap 12/24/2011   Allergies: No Known Allergies  Current Medications: Current Outpatient Medications  Medication Sig Dispense Refill   cholecalciferol (VITAMIN D3) 10 MCG (400 UNIT) TABS tablet Take 800 Units by mouth daily.     cyanocobalamin 1000 MCG tablet Take 400 mcg by mouth daily.     pravastatin (PRAVACHOL) 40 MG tablet TAKE 1 TABLET BY MOUTH EVERY DAY IN THE EVENING 90 tablet 3   DULoxetine (CYMBALTA) 30 MG capsule TAKE 1 CAPSULE BY MOUTH EVERY DAY (Patient not taking: Reported on 10/12/2023) 90 capsule 2   gabapentin (NEURONTIN) 300 MG capsule Take 1 capsule (300 mg total) by mouth at bedtime. (Patient not taking: Reported on 10/12/2023) 60 capsule 2   No current facility-administered medications for this visit.   Immunization History  Administered Date(s) Administered   PFIZER Comirnaty(Gray Top)Covid-19 Tri-Sucrose Vaccine 05/08/2020   PFIZER(Purple Top)SARS-COV-2 Vaccination 05/08/2020, 05/29/2020   Tdap 12/24/2011   Review  of Systems General:  no complaints Skin: no complaints Eyes: no complaints HEENT: no complaints Breasts: no complaints Pulmonary: no complaints Cardiac: no complaints Gastrointestinal: no complaints Genitourinary/Sexual: no complaints Ob/Gyn: no complaints Musculoskeletal: no complaints Hematology: no complaints Neurologic/Psych: no complaints  Objective:  Physical Examination:  BP 117/66   Pulse 73   Temp (!) 96.8 F (36 C)   Resp 19   Wt 218 lb 11.2 oz (99.2 kg)   SpO2 98%   BMI 38.74 kg/m     ECOG Performance Status: 0 - Asymptomatic  GENERAL: Patient is a well appearing female in no acute distress HEENT:  Atraumatic and normocephalic. PERRL, neck supple. NODES:  No cervical, supraclavicular, axillary, or inguinal lymphadenopathy palpated.  LUNGS:  Normal respiratory pattern ABDOMEN:  Soft, nontender. Nondistended. No masses/ascites/hernia/or hepatomegaly.  EXTREMITIES:  No peripheral edema.   SKIN:  Clear with no obvious rashes or skin changes. No nail dyscrasia. NEURO:  Nonfocal. Well oriented.  Appropriate affect.  Pelvic: Exam chaperoned by CMA EGBUS: no lesions Cervix: surgically absent Vagina: no lesions, no discharge or bleeding Uterus: surgically absent BME: no palpable masses Rectovaginal: deferred   Lab Review As per HPI  Radiologic Imaging: Per HPI    Assessment:  SCHELLY CHUBA is a 68 y.o. female diagnosed with stage IV high grade serous fallopian tube cancer based on malignant pleural effusion 7/21. CA 125 at diagnosis was 53. Underwent NACT with paclitaxel/carboplatin for 4 cycles and MIS optimal interval debulking on 06/05/2020.  Completed 3 cycles of adjuvant carboplatin-taxol chemotherapy on 08/12/20, on maintenance olaparib for 2.5 years until 7/24 with Dr Cathie Hoops.  NED on exam today.  CA 125 low and normal.   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  No falls or problems with buttons.    Medical co-morbidities complicating care: Body mass index is 38.74 kg/m. Plan:   Problem List Items Addressed This Visit       Genitourinary   Carcinoma of fallopian tube (HCC) - Primary (Chronic)    Will continue follow up with Dr Cathie Hoops after her CT and CA125 later this week.    RTC in 6 months for follow up in Gyn Onc clinic with CA125. We can alternate with [name].    I personally had a face to face interaction and evaluated the patient.   Caldwell Kronenberger Leta Jungling, MD

## 2023-10-13 ENCOUNTER — Other Ambulatory Visit: Payer: Self-pay | Admitting: Family

## 2023-10-13 DIAGNOSIS — Z1231 Encounter for screening mammogram for malignant neoplasm of breast: Secondary | ICD-10-CM

## 2023-10-26 ENCOUNTER — Inpatient Hospital Stay: Payer: Medicare HMO

## 2023-10-26 ENCOUNTER — Ambulatory Visit
Admission: RE | Admit: 2023-10-26 | Discharge: 2023-10-26 | Disposition: A | Discharge status: Home / Self Care | Payer: Medicare HMO | Source: Ambulatory Visit | Attending: Oncology | Admitting: Oncology

## 2023-10-26 DIAGNOSIS — Z90722 Acquired absence of ovaries, bilateral: Secondary | ICD-10-CM | POA: Diagnosis not present

## 2023-10-26 DIAGNOSIS — Z1502 Genetic susceptibility to malignant neoplasm of ovary: Secondary | ICD-10-CM | POA: Diagnosis not present

## 2023-10-26 DIAGNOSIS — Z9221 Personal history of antineoplastic chemotherapy: Secondary | ICD-10-CM | POA: Diagnosis not present

## 2023-10-26 DIAGNOSIS — R918 Other nonspecific abnormal finding of lung field: Secondary | ICD-10-CM | POA: Diagnosis not present

## 2023-10-26 DIAGNOSIS — C5700 Malignant neoplasm of unspecified fallopian tube: Secondary | ICD-10-CM

## 2023-10-26 DIAGNOSIS — Z8544 Personal history of malignant neoplasm of other female genital organs: Secondary | ICD-10-CM | POA: Diagnosis not present

## 2023-10-26 DIAGNOSIS — Z08 Encounter for follow-up examination after completed treatment for malignant neoplasm: Secondary | ICD-10-CM | POA: Diagnosis not present

## 2023-10-26 DIAGNOSIS — N183 Chronic kidney disease, stage 3 unspecified: Secondary | ICD-10-CM | POA: Diagnosis not present

## 2023-10-26 DIAGNOSIS — R935 Abnormal findings on diagnostic imaging of other abdominal regions, including retroperitoneum: Secondary | ICD-10-CM | POA: Insufficient documentation

## 2023-10-26 DIAGNOSIS — Z90710 Acquired absence of both cervix and uterus: Secondary | ICD-10-CM | POA: Insufficient documentation

## 2023-10-26 DIAGNOSIS — Z9079 Acquired absence of other genital organ(s): Secondary | ICD-10-CM | POA: Diagnosis not present

## 2023-10-26 DIAGNOSIS — Z1509 Genetic susceptibility to other malignant neoplasm: Secondary | ICD-10-CM | POA: Diagnosis not present

## 2023-10-26 DIAGNOSIS — G620 Drug-induced polyneuropathy: Secondary | ICD-10-CM | POA: Diagnosis not present

## 2023-10-26 DIAGNOSIS — Z1501 Genetic susceptibility to malignant neoplasm of breast: Secondary | ICD-10-CM | POA: Diagnosis not present

## 2023-10-26 DIAGNOSIS — Z148 Genetic carrier of other disease: Secondary | ICD-10-CM | POA: Diagnosis not present

## 2023-10-26 LAB — CBC WITH DIFFERENTIAL (CANCER CENTER ONLY)
Abs Immature Granulocytes: 0.02 10*3/uL (ref 0.00–0.07)
Basophils Absolute: 0 10*3/uL (ref 0.0–0.1)
Basophils Relative: 0 %
Eosinophils Absolute: 0.1 10*3/uL (ref 0.0–0.5)
Eosinophils Relative: 2 %
HCT: 37.8 % (ref 36.0–46.0)
Hemoglobin: 12.1 g/dL (ref 12.0–15.0)
Immature Granulocytes: 0 %
Lymphocytes Relative: 39 %
Lymphs Abs: 1.9 10*3/uL (ref 0.7–4.0)
MCH: 30 pg (ref 26.0–34.0)
MCHC: 32 g/dL (ref 30.0–36.0)
MCV: 93.6 fL (ref 80.0–100.0)
Monocytes Absolute: 0.5 10*3/uL (ref 0.1–1.0)
Monocytes Relative: 10 %
Neutro Abs: 2.3 10*3/uL (ref 1.7–7.7)
Neutrophils Relative %: 49 %
Platelet Count: 152 10*3/uL (ref 150–400)
RBC: 4.04 MIL/uL (ref 3.87–5.11)
RDW: 13.4 % (ref 11.5–15.5)
WBC Count: 4.8 10*3/uL (ref 4.0–10.5)
nRBC: 0 % (ref 0.0–0.2)

## 2023-10-26 LAB — CMP (CANCER CENTER ONLY)
ALT: 15 U/L (ref 0–44)
AST: 17 U/L (ref 15–41)
Albumin: 4.1 g/dL (ref 3.5–5.0)
Alkaline Phosphatase: 52 U/L (ref 38–126)
Anion gap: 5 (ref 5–15)
BUN: 12 mg/dL (ref 8–23)
CO2: 24 mmol/L (ref 22–32)
Calcium: 9 mg/dL (ref 8.9–10.3)
Chloride: 108 mmol/L (ref 98–111)
Creatinine: 0.74 mg/dL (ref 0.44–1.00)
GFR, Estimated: >60 mL/min (ref >60)
Glucose, Bld: 111 mg/dL — ABNORMAL HIGH (ref 70–99)
Potassium: 3.6 mmol/L (ref 3.5–5.1)
Sodium: 137 mmol/L (ref 135–145)
Total Bilirubin: 0.9 mg/dL (ref 0.0–1.2)
Total Protein: 7 g/dL (ref 6.5–8.1)

## 2023-10-26 MED ORDER — IOHEXOL 300 MG/ML  SOLN
100.0000 mL | Freq: Once | INTRAMUSCULAR | Status: AC | PRN
  Administered 2023-10-26: 100 mL via INTRAVENOUS

## 2023-10-26 MED ORDER — BARIUM SULFATE 2 % PO SUSP
450.0000 mL | ORAL | Status: AC
  Administered 2023-10-26 (×2): 450 mL via ORAL

## 2023-10-27 LAB — CA 125: Cancer Antigen (CA) 125: 3.5 U/mL (ref 0.0–38.1)

## 2023-11-02 ENCOUNTER — Encounter: Payer: Self-pay | Admitting: Oncology

## 2023-11-02 ENCOUNTER — Inpatient Hospital Stay (HOSPITAL_BASED_OUTPATIENT_CLINIC_OR_DEPARTMENT_OTHER): Payer: Medicare HMO | Admitting: Oncology

## 2023-11-02 VITALS — BP 133/76 | HR 56 | Temp 96.0°F | Resp 18 | Wt 223.9 lb

## 2023-11-02 DIAGNOSIS — N1831 Chronic kidney disease, stage 3a: Secondary | ICD-10-CM | POA: Diagnosis not present

## 2023-11-02 DIAGNOSIS — Z148 Genetic carrier of other disease: Secondary | ICD-10-CM | POA: Diagnosis not present

## 2023-11-02 DIAGNOSIS — C5700 Malignant neoplasm of unspecified fallopian tube: Secondary | ICD-10-CM | POA: Diagnosis not present

## 2023-11-02 DIAGNOSIS — G629 Polyneuropathy, unspecified: Secondary | ICD-10-CM

## 2023-11-02 DIAGNOSIS — Z90710 Acquired absence of both cervix and uterus: Secondary | ICD-10-CM | POA: Diagnosis not present

## 2023-11-02 DIAGNOSIS — Z8544 Personal history of malignant neoplasm of other female genital organs: Secondary | ICD-10-CM | POA: Diagnosis not present

## 2023-11-02 DIAGNOSIS — Z1509 Genetic susceptibility to other malignant neoplasm: Secondary | ICD-10-CM | POA: Diagnosis not present

## 2023-11-02 DIAGNOSIS — N183 Chronic kidney disease, stage 3 unspecified: Secondary | ICD-10-CM | POA: Diagnosis not present

## 2023-11-02 DIAGNOSIS — Z90722 Acquired absence of ovaries, bilateral: Secondary | ICD-10-CM | POA: Diagnosis not present

## 2023-11-02 DIAGNOSIS — Z9221 Personal history of antineoplastic chemotherapy: Secondary | ICD-10-CM | POA: Diagnosis not present

## 2023-11-02 DIAGNOSIS — Z1502 Genetic susceptibility to malignant neoplasm of ovary: Secondary | ICD-10-CM | POA: Diagnosis not present

## 2023-11-02 DIAGNOSIS — Z9079 Acquired absence of other genital organ(s): Secondary | ICD-10-CM | POA: Diagnosis not present

## 2023-11-02 DIAGNOSIS — Z1501 Genetic susceptibility to malignant neoplasm of breast: Secondary | ICD-10-CM | POA: Diagnosis not present

## 2023-11-02 DIAGNOSIS — G620 Drug-induced polyneuropathy: Secondary | ICD-10-CM | POA: Diagnosis not present

## 2023-11-02 DIAGNOSIS — Z08 Encounter for follow-up examination after completed treatment for malignant neoplasm: Secondary | ICD-10-CM | POA: Diagnosis not present

## 2023-11-02 NOTE — Assessment & Plan Note (Signed)
 Encourage oral hydration and avoid nephrotoxins.

## 2023-11-02 NOTE — Assessment & Plan Note (Signed)
 Recommend calcium and vitamin D supplementation.

## 2023-11-02 NOTE — Progress Notes (Signed)
 Hematology/Oncology Progress note [phone] [phone]) [phone]   REASON FOR VISIT Follow up for High grade serous adenocarcinoma.    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 S/p 4 cycles of neoadjuvant chemotherapy  Carboplatin /Taxol  S/p total hysterectomy with salpingo-oophorectomy and omentectomy. Optimal (R<1) interval tumor debulking, -followed by 3 cycles of adjuvant carboplatin/Taxol. S/p 2.5 years of Olarparib 300mg  daily, stopped in July/August 2024   Labs reviewed and discussed with patient.  CA125 is stable. CT scan showed no evidence of cancer recurrence.  Neuropathy She did not tolerate Lyrica to 100 mg twice daily.  Acupuncture did not work  Previously self discontinued gabapentin 300mg  QHS and Cymbalta 30mg  daily.  She currently not taking any treatments.    Hypocalcemia Recommend calcium and vitamin D supplementation.   CKD (chronic kidney disease) stage 3, GFR 30-59 ml/min (HCC) Encourage oral hydration and avoid nephrotoxins.       Orders Placed This Encounter  Procedures   CT CHEST ABDOMEN PELVIS W CONTRAST    Standing Status:   Future    Expected Date:   05/04/2024    Expiration Date:   11/01/2024    If indicated for the ordered procedure, I authorize the administration of contrast media per Radiology protocol:   Yes    Does the patient have a contrast media/X-ray dye allergy?:   No    Preferred imaging location?:   [facility]    If indicated for the ordered procedure, I authorize the administration of oral contrast media per  Radiology protocol:   Yes   CT CHEST ABDOMEN PELVIS W CONTRAST    Standing Status:   Future    Expected Date:   05/04/2024    Expiration Date:   11/01/2024    If indicated for the ordered procedure, I authorize the administration of contrast media per Radiology protocol:   Yes    Does the patient have a contrast media/X-ray dye allergy?:   No    Preferred imaging location?:   [facility]    If indicated for the ordered procedure, I authorize the administration of oral contrast media per Radiology protocol:   Yes   CBC with Differential (Cancer Center Only)    Standing Status:   Future    Expected Date:   05/04/2024    Expiration Date:   11/01/2024   CA 125    Standing Status:   Future    Expected Date:   11/02/2023    Expiration Date:   11/01/2024   CMP (Cancer Center only)    Standing Status:   Future    Expected Date:   05/04/2024    Expiration Date:   11/01/2024   Follow up in 6  months  All questions were answered. The patient knows to call the clinic with any problems, questions or concerns.  [name], MD, PhD [facility] Hematology Oncology 11/02/2023    PERTINENT ONCOLOGY HISTORY Linda Schmitt is a 68 y.o.afemalefemale who has above oncology history reviewed by me presents for follow up of carcinoma of fallopian tube   Oncology History  Carcinoma of fallopian tube (HCC)  02/18/2020 Cancer Staging    -status post  thoracentesis x2 during her recent admission. Cytology was positive for metastatic carcinoma.  TTF-1 Napsin A, GATA3, CDX2 were negative. Positive for PAX 8, which is most often expressing tumors of gynecological and renal origin Staging form: Ovary, Fallopian Tube, and Primary Peritoneal Carcinoma, AJCC 8th Edition - Pathologic stage from 02/18/2020: FIGO Stage IVA (pM1a) - Signed by [name], MD on 08/12/2020   02/25/2020 Imaging   PET scan initial staging 1. Exam shows evidence of peritoneal disease within the abdomen including FDG avid soft tissue infiltration  into the omentum.  2. Large, loculated left pleural effusion with thin, FDG avid rind of soft tissue overlying the left lung. This corresponds to known, pathology proven malignant pleural effusion. 3. Small right pleural effusion with mild FDG uptake is equivocal for malignant effusion of the right hemithorax. 4. Fluid density structure within right side of pelvis abutting the right-side of the uterus is noted. This is indeterminate. Further investigation with pelvic sonogram may be helpful.   03/04/2020 - 05/06/2020 Chemotherapy   4 cycles of neoadjuvant carbo and Taxol   04/24/2020 Imaging   CT chest abdomen pelvis with contrast showed 1. Moderate left malignant pleural effusion, decreased in size compared to the prior examination. 2. Omental caking highly concerning for intraperitoneal metastatic disease. 3. No definite primary malignancy confidently identified on today's examination. 4. Stable nodule in the lateral limb of the left adrenal gland dating back to at least 2017, presumably a benign lesions such as an adrenal adenoma.5. Aortic atherosclerosis.6. Additional incidental findings, as above.     05/21/2020 Imaging   CT chest with contrast 1. Small left pleural effusion similar to prior CT. Overall no significant interval change compared to the prior CT. 2. Stable left adrenal nodule. 3. Aortic Atherosclerosis (ICD10-I70.0).   06/05/2020 Procedure    status post Total hysterectomy and bilateral salpingo-oophorectomy, Omentectomy, and Optimal (R<1) interval tumor debulking. A. Peritoneal nodule, excisional biopsy: Fibroadipose tissue with calcifications, negative for tumor. B. Uterus, bilateral ovaries and fallopian tubes, hysterectomy and bilateral salpingo-oophorectomy: Cervix: Squamous metaplasia. Endometrium: Inactive endometrium. Myometrium: Adenomyosis. Serosa: High grade serous adenocarcinoma. Right ovary: High grade serous adenocarcinoma, involving the ovarian  surface and parenchyma. Right fallopian tube: No specific pathologic change. Left ovary: High grade serous adenocarcinoma, involving the ovarian surface and parenchyma. Left fallopian tube: High grade serous adenocarcinoma, involving tubal fimbria. Serous tubal intraepithelial carcinoma. Immunohistochemistry was performed on block B14 at [facility] to characterize the pathologic process and demonstrates the following immunophenotype in the cells of interest:POSITIVE:  p53, p16 (strong, patchy), Ki67 (elevated) This staining profile supports the above diagnosis. C. Peritoneal nodule, left pericolic gutter, excisional biopsy: High grade serous adenocarcinoma. D. Cul-de-sac, excisional biopsy: High grade serous adenocarcinoma. E. Omentum, excision: High grade serous adenocarcinoma (up to 3.0 cm).   TUMOR   Tumor Site:    Left fallopian tube   Histologic Type:    Serous carcinoma   Histologic Grade:    High grade   Tumor Size:    Greatest Dimension (Centimeters): 0.2 cm   Ovarian Surface Involvement:    Present     Laterality:    Bilateral   Fallopian Tube Surface Involvement:    Present     Laterality:    Left   Implants:    Present (sites): left peritoneum, cul-de-sac, omentum   Other Tissue / Organ Involvement:    Right ovary   Other Tissue / Organ Involvement:    Left ovary   Other Tissue / Organ Involvement:  Left fallopian tube   Other Tissue / Organ Involvement:    Pelvic peritoneum   Other Tissue / Organ Involvement:    Omentum   Largest Extrapelvic Peritoneal Focus:    Macroscopic (greater than 2 cm)   Peritoneal / Ascitic Fluid:    Not submitted / unknown   Pleural Fluid:    Not submitted / unknown   Treatment Effect:    Moderate response identified (CRS 2)   LYMPH NODES   Regional Lymph Nodes:    No lymph nodes submitted or found   PATHOLOGIC STAGE CLASSIFICATION (pTNM, AJCC 8th Edition)   TNM Descriptors:    y (post-treatment)   Primary Tumor (pT):    pT3c   Regional  Lymph Nodes (pN):    pNX  FIGO Stage:    IIIC   + pleural effusion so STAGE IV No germline BRCA mutation, -Homologous recombination deficiency positive.  Myriad Genomic instability score:  positive, somatic BRCA1 positive   06/25/2020 Initial Diagnosis   Carcinoma of fallopian tube (HCC)   07/01/2020 - 08/12/2020 Chemotherapy   3 cycles of adjuvant chemotherapy with carboplatin AUC 5, Taxol 175 mg/m.     09/02/2020 -  Chemotherapy   started on olaparib 300 mg twice daily.   11/16/2020 Imaging   CT chest abdomen pelvis with contrast showed 1. Status post interval hysterectomy and bilateral salpingectomy. 2. Status post interval omentectomy. There is no residual suspicious peritoneal nodularity identified. Unchanged small nodules adjacent to the spleen which are stable compared to examinations dating back to 2017 and likely small splenules and/or lymph nodes. Attention on follow-up 3. Interval resolution of a previously seen moderate left pleural effusion. There is minimal residual pleural thickening. 4. Findings are consistent with treatment response.5. No evidence of new metastatic disease in the chest, abdomen, or pelvis.   Aortic Atherosclerosis (ICD10-I70.0).   03/18/2021 Imaging   CT chest abdomen pelvis with contrast showed Chest Impression: 1. No evidence metastatic disease in the thorax. 2. Stable borderline enlarged mediastinal lymph nodes and RIGHT hilar node. Recommend attention on follow-up. Abdomen / Pelvis Impression: 1. No evidence of peritoneal or omental nodal metastatic recurrence. 2. No evidence of local recurrence in the pelvis. 3. No free fluid in the abdomen pelvis.    07/14/2021 Imaging   CT chest abdomen pelvis with contrast showed 1. No evidence of new or progressive disease within the chest,abdomen, or pelvis. 2. Stable borderline enlarged mediastinal and right hilar lymph nodes, nonspecific and possibly reactive. Recommend attention on follow-up  imaging. 3. Moderate volume of formed stool throughout the colon suggestive of constipation. 4. Stable 1.4 cm left adrenal nodule previously characterized as an adenoma on PET-CT February 25, 2020. 5.  Aortic Atherosclerosis (ICD10-I70.0).   08/12/2021 Imaging   CT chest abdomen pelvis wo contrast  1. No evidence of new metastatic disease. 2. Left adrenal adenoma. 3.  Aortic atherosclerosis (ICD10-I70.0). 4.  Emphysema (ICD10-J43.9).   11/24/2021 Imaging   CT chest abdomen pelvis with contrast 1. No new or progressive findings in the chest, abdomen or pelvis.2. Scattered small perisplenic nodules are unchanged potentially small splenules. 3. Stable LEFT adrenal lesion, unchanged dating back to October of 2017. This likely represents an adenoma. 4. Post hysterectomy.5. Aortic atherosclerosis.   04/07/2022 Imaging   CT chest abdomen pelvis without contrast 1. Status post hysterectomy, oophorectomy, and omentectomy. No noncontrast evidence of recurrent or metastatic disease in the chest, abdomen, or pelvis. 2. Multiple small nodules adjacent to the spleen in the left upper quadrant,  measuring up to 0.9 x 0.7 cm. These are again most likely small accessory splenules, not previously FDG avid. Attention on follow-up. 3. Minimal emphysema, diffuse bilateral bronchial wall thickening, and background of very fine centrilobular nodules, most concentrated in the lung apices, consistent with smoking-related respiratory bronchiolitis.  Aortic Atherosclerosis (ICD10-I70.0) and Emphysema (ICD10-J43.9).   12/22/2022 Imaging   CT chest abdomen pelvis without contrast showed 1. Subtle areas of new bilateral peribronchovascular ground-glass and ground-glass nodularity, suspicious for mild infection, including atypical etiologies. 2. No evidence of metastatic disease, status post hysterectomy. 3. Left adrenal nodule which is similar back to 2021 PET and can be presumed benign. Likely a lipid poor adenoma. No  specific follow-up indicated. 4.  Aortic Atherosclerosis (ICD10-I70.0).     04/21/2023 Imaging   CT chest abdomen pelvis w contrast  1. Stable examination without evidence of local recurrence or metastatic disease in the chest, abdomen or pelvis. 2. Stable 2 mm pulmonary nodule in the posterior left upper lobe,considered benign. 3. Stable 16 mm presumed lipid poor left adrenal adenoma. 4.  Aortic Atherosclerosis (ICD10-I70.0).   10/26/2023 Imaging   CT chest abdomen pelvis w contrast showed 1. Status post hysterectomy without suspicious pelvic mass. No convincing evidence of recurrent or metastatic disease in the chest, abdomen or pelvis. 2. Stable tiny scattered pulmonary nodules, favored benign. 3. Stable presumed benign left adrenal adenoma measuring 16 mm.       INTERVAL HISTORY 68 year old female presents for follow-up of stage IV ovarian cancer,  somatic BRCA1 positive and HRD positive.  Patient has been off olaparib since July/August 2024  Overall patient tolerates well denies any nausea vomiting diarrhea.  She feels well.   + Neuropathy, numbness of her toes, symptoms are stable or improved.  She is no on any treatment due to intolerance and/or ineffectiveness of pharmacologist treatments.   Review of Systems  Constitutional:  Negative for appetite change, chills, fatigue and fever.  HENT:   Negative for hearing loss and voice change.   Eyes:  Negative for eye problems.  Respiratory:  Negative for chest tightness and cough.   Cardiovascular:  Negative for chest pain.  Gastrointestinal:  Negative for abdominal distention, abdominal pain and blood in stool.  Endocrine: Negative for hot flashes.  Genitourinary:  Negative for difficulty urinating and frequency.   Musculoskeletal:  Negative for arthralgias.  Skin:  Negative for itching and rash.  Neurological:  Positive for numbness. Negative for extremity weakness.  Hematological:  Negative for adenopathy.   Psychiatric/Behavioral:  Positive for sleep disturbance. Negative for confusion.         No Known Allergies   Past Medical History:  Diagnosis Date   Cancer (HCC)    ovarian cancer   Family history of breast cancer    Genetic testing 04/09/2020   No pathogenic variants identified on the Myriad MyRisk (germline) panel. The report date is 04/07/2020.   The [facility] gene panel offered by [employer] includes sequencing and deletion/duplication testing of the following 35 genes: APC, ATM, AXIN2, BARD1, BMPR1A, BRCA1, BRCA2, BRIP1, CHD1, CDK4, CDKN2A, CHEK2, EPCAM (large rearrangement only), HOXB13, GALNT12, MLH1, MSH2, MSH3, MSH6   HLD (hyperlipidemia)      Past Surgical History:  Procedure Laterality Date   CARPAL TUNNEL RELEASE Left    COLONOSCOPY WITH PROPOFOL N/A 12/10/2020   Procedure: COLONOSCOPY WITH PROPOFOL;  Surgeon: [name], MD;  Location: [facility] ENDOSCOPY;  Service: Endoscopy;  Laterality: N/A;   ROBOTIC ASSISTED TOTAL HYSTERECTOMY WITH BILATERAL SALPINGO OOPHERECTOMY  06/05/2020   with omentectomy tumor debulking via mini-laparotomy/hand assisted port   SHOULDER ARTHROSCOPY WITH SUBACROMIAL DECOMPRESSION AND OPEN ROTATOR C Right 02/01/2020   Procedure: RIGHT SHOULDER ARTHROSCOPIC SUBSCAPULARIS REPAIR, ROTATOR CUFF REPAIR, SUABCROMIAL DECOMPRESSION, AND BICEPS TENODESIS.;  Surgeon: Signa Kell, MD;  Location: [facility] ORS;  Service: Orthopedics;  Laterality: Right;   TUBAL LIGATION      Social History   Socioeconomic History   Marital status: Single    Spouse name: Not on file   Number of children: Not on file   Years of education: Not on file   Highest education level: Not on file  Occupational History   Not on file  Tobacco Use   Smoking status: Former    Current packs/day: 0.00    Average packs/day: 0.8 packs/day for 40.0 years (30.0 ttl pk-yrs)    Types: Cigarettes    Start date: 02/06/1978    Quit date: 02/06/2018    Years since quitting:  5.7   Smokeless tobacco: Never   Tobacco comments:    quit 02/2018  Vaping Use   Vaping status: Never Used  Substance and Sexual Activity   Alcohol use: Yes    Comment: 2-3 glasses of wine per week,none last 24hrs   Drug use: No   Sexual activity: Not on file  Other Topics Concern   Not on file  Social History Narrative   Lives in [place] alone. Work - [employer]   Diet: Regular   Exercise: walking   Social Drivers of Corporate investment banker Strain: Low Risk  (12/03/2022)   Overall Financial Resource Strain (CARDIA)    Difficulty of Paying Living Expenses: Not hard at all  Food Insecurity: No Food Insecurity (12/03/2022)   Hunger Vital Sign    Worried About Running Out of Food in the Last Year: Never true    Ran Out of Food in the Last Year: Never true  Transportation Needs: No Transportation Needs (12/03/2022)   PRAPARE - Administrator, Civil Service (Medical): No    Lack of Transportation (Non-Medical): No  Physical Activity: Insufficiently Active (12/03/2022)   Exercise Vital Sign    Days of Exercise per Week: 3 days    Minutes of Exercise per Session: 20 min  Stress: No Stress Concern Present (12/03/2022)   [employer] of Occupational Health - Occupational Stress Questionnaire    Feeling of Stress : Not at all  Social Connections: Unknown (12/03/2022)   Social Connection and Isolation Panel [NHANES]    Frequency of Communication with Friends and Family: More than three times a week    Frequency of Social Gatherings with Friends and Family: More than three times a week    Attends Religious Services: Not on file    Active Member of Clubs or Organizations: Not on file    Attends Banker Meetings: Not on file    Marital Status: Not on file  Intimate Partner Violence: Not At Risk (12/03/2022)   Humiliation, Afraid, Rape, and Kick questionnaire    Fear of Current or Ex-Partner: No    Emotionally Abused: No    Physically Abused: No     Sexually Abused: No    Family History  Problem Relation Age of Onset   Cancer Mother        lymphoma   Stroke Sister    Cancer Brother        lung   Cancer Sister        lung   Lung  cancer Brother    Bone cancer Sister    Breast cancer Cousin 60       maternal   Cancer Maternal Aunt        unk types     Current Outpatient Medications:    cholecalciferol (VITAMIN D3) 10 MCG (400 UNIT) TABS tablet, Take 800 Units by mouth daily., Disp: , Rfl:    cyanocobalamin 1000 MCG tablet, Take 400 mcg by mouth daily., Disp: , Rfl:    DULoxetine (CYMBALTA) 30 MG capsule, TAKE 1 CAPSULE BY MOUTH EVERY DAY, Disp: 90 capsule, Rfl: 2   pravastatin (PRAVACHOL) 40 MG tablet, TAKE 1 TABLET BY MOUTH EVERY DAY IN THE EVENING, Disp: 90 tablet, Rfl: 3   gabapentin (NEURONTIN) 300 MG capsule, Take 1 capsule (300 mg total) by mouth at bedtime. (Patient not taking: Reported on 04/06/2023), Disp: 60 capsule, Rfl: 2  Physical exam:  Vitals:   11/02/23 0955  BP: 133/76  Pulse: (!) 56  Resp: 18  Temp: (!) 96 F (35.6 C)  TempSrc: Tympanic  SpO2: 100%  Weight: 223 lb 14.4 oz (101.6 kg)   Physical Exam Constitutional:      General: She is not in acute distress.    Appearance: She is not diaphoretic.  HENT:     Head: Normocephalic.  Eyes:     General: No scleral icterus. Cardiovascular:     Rate and Rhythm: Normal rate and regular rhythm.     Heart sounds: No murmur heard. Pulmonary:     Effort: Pulmonary effort is normal. No respiratory distress.     Breath sounds: No wheezing.  Chest:     Chest wall: No tenderness.  Abdominal:     General: Bowel sounds are normal. There is no distension.     Palpations: Abdomen is soft.  Musculoskeletal:        General: Normal range of motion.     Cervical back: Normal range of motion and neck supple.  Skin:    General: Skin is warm and dry.     Findings: No erythema.  Neurological:     Mental Status: She is alert and oriented to person, place, and  time. Mental status is at baseline.     Cranial Nerves: No cranial nerve deficit.     Motor: No abnormal muscle tone.     Coordination: Coordination normal.  Psychiatric:        Mood and Affect: Mood and affect normal.     Labs are reviewed and discussed with patient.    Latest Ref Rng & Units 10/26/2023    8:26 AM 04/21/2023    9:25 AM 01/18/2023    9:48 AM  CBC  WBC 4.0 - 10.5 K/uL 4.8  4.4  4.2   Hemoglobin 12.0 - 15.0 g/dL 09.8  11.9  14.7   Hematocrit 36.0 - 46.0 % 37.8  38.8  33.0   Platelets 150 - 400 K/uL 152  156  179       Latest Ref Rng & Units 10/26/2023    8:26 AM 04/21/2023    9:25 AM 01/18/2023    9:48 AM  CMP  Glucose 70 - 99 mg/dL 829  562  130   BUN 8 - 23 mg/dL 12  14  10    Creatinine 0.44 - 1.00 mg/dL 8.65  7.84  6.96   Sodium 135 - 145 mmol/L 137  138  136   Potassium 3.5 - 5.1 mmol/L 3.6  3.4  3.3   Chloride 98 -  111 mmol/L 108  105  108   CO2 22 - 32 mmol/L 24  25  22    Calcium 8.9 - 10.3 mg/dL 9.0  9.2  8.9   Total Protein 6.5 - 8.1 g/dL 7.0  7.4  7.0   Total Bilirubin 0.0 - 1.2 mg/dL 0.9  0.7  0.6   Alkaline Phos 38 - 126 U/L 52  48  38   AST 15 - 41 U/L 17  20  17    ALT 0 - 44 U/L 15  18  13     RADIOGRAPHIC STUDIES: I have personally reviewed the radiological images as listed and agreed with the findings in the report. CT CHEST ABDOMEN PELVIS W CONTRAST Result Date: 10/26/2023 CLINICAL DATA:  History of fallopian tube cancer, follow-up. * Tracking Code: BO * EXAM: CT CHEST, ABDOMEN, AND PELVIS WITH CONTRAST TECHNIQUE: Multidetector CT imaging of the chest, abdomen and pelvis was performed following the standard protocol during bolus administration of intravenous contrast. RADIATION DOSE REDUCTION: This exam was performed according to the departmental dose-optimization program which includes automated exposure control, adjustment of the mA and/or kV according to patient size and/or use of iterative reconstruction technique. CONTRAST:  OMNIPAQUE  IOHEXOL 300 MG/ML  SOLN COMPARISON:  Multiple priors including most recent CT April 21, 2023 FINDINGS: CT CHEST FINDINGS Cardiovascular: No significant vascular findings. Normal heart size. No pericardial effusion. Mediastinum/Nodes: No suspicious thyroid nodule. No pathologically enlarged mediastinal, hilar or axillary lymph nodes. Stable 6 mm short axis prevascular lymph node on image 21/2. Lungs/Pleura: Stable tiny scattered pulmonary nodules for instance a 2 mm nodule in the posterior left upper lobe on image 52/3 and a 2 mm right upper lobe pulmonary nodule on image 47/3. No new suspicious pulmonary nodules or masses. Musculoskeletal: No aggressive lytic or blastic lesion of bone. CT ABDOMEN PELVIS FINDINGS Hepatobiliary: Diffuse hepatic steatosis. No suspicious hepatic lesion. Gallbladder is unremarkable. No biliary ductal dilation. Pancreas: No pancreatic ductal dilation or evidence of acute inflammation. Spleen: No splenomegaly. Adrenals/Urinary Tract: Stable 16 mm left adrenal nodule which was not hypermetabolic on PET-CT 02/25/2020 and is unchanged from that examination in size favored a benign adenoma. Right adrenal gland appears normal. No hydronephrosis. Kidneys demonstrate symmetric enhancement. Urinary bladder is unremarkable for degree of distension. Stomach/Bowel: Stomach is unremarkable for degree of distension. No pathologic dilation of small or large bowel. No evidence of acute bowel inflammation. Vascular/Lymphatic: Normal caliber abdominal aorta. Smooth IVC contours. No pathologically enlarged abdominal or pelvic lymph nodes. Reproductive: Status post hysterectomy. No adnexal masses. Other: No significant abdominopelvic free fluid. Musculoskeletal: No aggressive lytic or blastic lesion of bone. Multilevel degenerative change of the spine. IMPRESSION: 1. Status post hysterectomy without suspicious pelvic mass. No convincing evidence of recurrent or metastatic disease in the chest, abdomen or  pelvis. 2. Stable tiny scattered pulmonary nodules, favored benign. 3. Stable presumed benign left adrenal adenoma measuring 16 mm. Electronically Signed   By: Maudry Mayhew M.D.   On: 10/26/2023 15:34

## 2023-11-02 NOTE — Assessment & Plan Note (Addendum)
+   pleural effusion cytology  -( somatic BRCA1 positive, HRD positive). S/p 4 cycles of neoadjuvant chemotherapy  Carboplatin /Taxol  S/p total hysterectomy with salpingo-oophorectomy and omentectomy. Optimal (R<1) interval tumor debulking, -followed by 3 cycles of adjuvant carboplatin/Taxol. S/p 2.5 years of Olarparib 300mg  daily, stopped in July/August 2024   Labs reviewed and discussed with patient.  CA125 is stable. CT scan showed no evidence of cancer recurrence.

## 2023-11-02 NOTE — Assessment & Plan Note (Addendum)
 She did not tolerate Lyrica to 100 mg twice daily.  Acupuncture did not work  Previously self discontinued gabapentin 300mg  QHS and Cymbalta 30mg  daily.  She currently not taking any treatments.

## 2023-11-07 ENCOUNTER — Encounter: Payer: Self-pay | Admitting: Internal Medicine

## 2023-11-07 ENCOUNTER — Ambulatory Visit (INDEPENDENT_AMBULATORY_CARE_PROVIDER_SITE_OTHER): Admitting: Internal Medicine

## 2023-11-07 ENCOUNTER — Ambulatory Visit: Payer: Self-pay

## 2023-11-07 VITALS — BP 124/62 | HR 75 | Ht 63.0 in | Wt 221.2 lb

## 2023-11-07 DIAGNOSIS — H509 Unspecified strabismus: Secondary | ICD-10-CM | POA: Insufficient documentation

## 2023-11-07 DIAGNOSIS — H1013 Acute atopic conjunctivitis, bilateral: Secondary | ICD-10-CM

## 2023-11-07 DIAGNOSIS — H1010 Acute atopic conjunctivitis, unspecified eye: Secondary | ICD-10-CM | POA: Insufficient documentation

## 2023-11-07 MED ORDER — KETOTIFEN FUMARATE 0.035 % OP SOLN
1.0000 [drp] | Freq: Two times a day (BID) | OPHTHALMIC | 0 refills | Status: AC
Start: 2023-11-07 — End: ?

## 2023-11-07 NOTE — Patient Instructions (Signed)
-  It was a pleasure meeting you today -I do suspect that you have an allergic conjunctivitis after mowing the lawn which is what is causing the irritation in your eye -You can use the refresh eyedrops to help with the irritation -I have prescribed ketotifen eyedrops which should help as well -I put an urgent referral to ophthalmology for the worsening "lazy eye" that you have -I have also put in for an urgent MRI. -If your symptoms worsen please go to the emergency room for further evaluation

## 2023-11-07 NOTE — Assessment & Plan Note (Addendum)
-  Patient states that she has chronic strabismus in her left eye.  However, she has noted worsening leg when she attempts to turn her eyes to the right as well as turning her eyes up. 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  No ptosis noted -I am unsure how acute this is but given that she states that this is worsening over the last 3 days I am concerned for a more acute process especially given that she does have a history of malignancy.  It is also possible that this is a chronic issue with mild worsening.  - Patient refused hospital work up and would prefer outpatient follow up. Risks and benefits explained -I have put in an urgent referral to ophthalmology -I have put in for an urgent MRI of her brain for further evaluation -Return precautions given to the patient

## 2023-11-07 NOTE — Progress Notes (Signed)
 Acute Office Visit  Subjective:     Patient ID: Linda Schmitt, female    DOB: June 03, 1956, 68 y.o.   MRN: [id number]  Chief Complaint  Patient presents with   Eye Pain    Pt stated that she was mowing on Friday and feels like something is in her eye.     Eye Pain  Associated symptoms include an eye discharge, double vision and eye redness.   Patient is in today for for irritation of her left eye after mowing her last Friday.  Patient states that she was mowing her lawn on Friday and in the next day she developed irritation in her left eye noticed some redness and clear discharge.  She came today for further evaluation of this.  She also states that since Friday she has noted that her "lazy eye" has worsened.  She states that normally when she blinks her left eye comes back to the center but since Friday she has noted that when she blinks her left eye does not come back to the center and she has occasional double vision.  No fevers or chills.  No focal weakness otherwise.  Did not notice any weakness in her eyelids.  Review of Systems  Constitutional: Negative.   HENT: Negative.    Eyes:  Positive for double vision, pain, discharge and redness.  Respiratory: Negative.    Cardiovascular: Negative.   Neurological:  Negative for sensory change, speech change, focal weakness and headaches.  Psychiatric/Behavioral: Negative.          Objective:    BP 124/62   Pulse 75   Ht 5\' 3"  (1.6 m)   Wt 221 lb 3.2 oz (100.3 kg)   SpO2 97%   BMI 39.18 kg/m    Physical Exam Constitutional:      Appearance: Normal appearance.  HENT:     Head: Normocephalic and atraumatic.  Eyes:     General: Lids are normal. No visual field deficit.       Right eye: No foreign body or discharge.        Left eye: No foreign body or discharge.     Extraocular Movements:     Right eye: No nystagmus.     Left eye: Abnormal extraocular motion present. No nystagmus.     Conjunctiva/sclera:     Right  eye: Right conjunctiva is injected. No exudate or hemorrhage.    Left eye: Left conjunctiva is injected. No exudate or hemorrhage.    Comments: Patient noted to have abnormal extraocular movements (chronic per patient but worsening over the last 3 days).  She had appear to have weakness of her left medial rectus and inferior oblique  Neurological:     Mental Status: She is alert.     No results found for any visits on 11/07/23.      Assessment & Plan:   Problem List Items Addressed This Visit       Other   Allergic conjunctivitis   -Patient states that she developed some irritation and redness in her left eye after mowing her lawn on Thursday. -Patient was noted to have mild erythema bilaterally but worse in the left -I suspect she likely has allergic conjunctivitis -Will treat with ketotifen eyedrops.  Patient has also been using refresh eyedrops which she states has been helping.  Will continue this -No further workup at this time      Relevant Medications   ketotifen (KP KETOTIFEN FUMARATE) 0.035 % ophthalmic solution  Strabismus - Primary   -Patient states that she has chronic strabismus in her left eye.  However, she has noted worsening leg when she attempts to turn her eyes to the right as well as turning her eyes up. 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  No ptosis noted -I am unsure how acute this is but given that she states that this is worsening over the last 3 days I am concerned for a more acute process especially given that she does have a history of malignancy.  It is also possible that this is a chronic issue with mild worsening.  -I have put in an urgent referral to ophthalmology -I have put in for an urgent MRI of her brain for further evaluation -Return precautions given to the patient      Relevant Orders   Ambulatory referral to Ophthalmology   MR Brain W Wo Contrast     Meds ordered this encounter  Medications   ketotifen (KP KETOTIFEN FUMARATE) 0.035 % ophthalmic solution    Sig: Apply 1 drop to eye in the morning and at bedtime.    Dispense:  5 mL    Refill:  0    No follow-ups on file.  Earl Lagos, MD

## 2023-11-07 NOTE — Assessment & Plan Note (Signed)
-  Patient states that she developed some irritation and redness in her left eye after mowing her lawn on Thursday. -Patient was noted to have mild erythema bilaterally but worse in the left -I suspect she likely has allergic conjunctivitis -Will treat with ketotifen eyedrops.  Patient has also been using refresh eyedrops which she states has been helping.  Will continue this -No further workup at this time

## 2023-11-07 NOTE — Telephone Encounter (Signed)
  Chief Complaint: left eye injury  Symptoms: discomfort/eye turning out   Disposition: [] ED /[] Urgent Care (no appt availability in office) / [x] Appointment(In office/virtual)/ []  [facility]/ [] Home Care/ [] Refused Recommended Disposition /[] [facility]/ []  Follow-up with PCP Additional Notes: Pt complaining of left eye turning out. Pt was weed-eating last Thursday and noticed her eye not working properly on Friday morning. Pt states it looks like trash/pus is on the sclera. Pt has been applying eye drops.  Pt denies any pain and can see out of when she covers right eye. PT has appt today at 1430 with Dr Carlena Sax. RN gave care advice and pt verbalized understanding.          Copied from CRM [id number]. Topic: Clinical - Red Word Triage >> Nov 07, 2023 11:51 AM Mackie Pai E wrote: [facility] that prompted transfer to Nurse Triage: Vision issues. Patient stated her left eyeball is stuck in the corner of her eye, but when she covers her right eye, it moves back to the center. Reason for Disposition  MODERATE eye pain or discomfort (e.g., interferes with normal activities or awakens from sleep; more than mild)  Answer Assessment - Initial Assessment Questions 1. ONSET: "When did the pain start?" (e.g., minutes, hours, days)     Last Friday 2. TIMING: "Does the pain come and go, or has it been constant since it started?" (e.g., constant, intermittent, fleeting)     Constant  3. SEVERITY: "How bad is the pain?"   (Scale 1-10; mild, moderate or severe)   - MILD (1-3): doesn't interfere with normal activities    - MODERATE (4-7): interferes with normal activities or awakens from sleep    - SEVERE (8-10): excruciating pain and patient unable to do normal activities     Denies  4. LOCATION: "Where does it hurt?"  (e.g., eyelid, eye, cheekbone)     Left eye  5. CAUSE: "What do you think is causing the pain?"     Cutting grass last Thursday  6. VISION: "Do you have blurred  vision or changes in your vision?"      Denies  7. EYE DISCHARGE: "Is there any discharge (pus) from the eye(s)?"  If Yes, ask: "What color is it?"      Clear drainage  8. FEVER: "Do you have a fever?" If Yes, ask: "What is it, how was it measured, and when did it start?"      Denies  9. OTHER SYMPTOMS: "Do you have any other symptoms?" (e.g., headache, nasal discharge, facial rash)     Pus on "white part of eye"  Protocols used: Eye Pain and Other Symptoms-A-AH

## 2023-11-08 ENCOUNTER — Ambulatory Visit

## 2023-11-09 ENCOUNTER — Ambulatory Visit
Admission: RE | Admit: 2023-11-09 | Discharge: 2023-11-09 | Disposition: A | Discharge status: Home / Self Care | Source: Ambulatory Visit | Attending: Internal Medicine | Admitting: Internal Medicine

## 2023-11-09 DIAGNOSIS — H509 Unspecified strabismus: Secondary | ICD-10-CM

## 2023-11-09 DIAGNOSIS — H4900 Third [oculomotor] nerve palsy, unspecified eye: Secondary | ICD-10-CM | POA: Diagnosis not present

## 2023-11-09 MED ORDER — GADOPICLENOL 0.5 MMOL/ML IV SOLN
10.0000 mL | Freq: Once | INTRAVENOUS | Status: AC | PRN
Start: 2023-11-09 — End: 2023-11-09
  Administered 2023-11-09: 10 mL via INTRAVENOUS

## 2023-11-10 ENCOUNTER — Ambulatory Visit: Admitting: Family

## 2023-11-17 DIAGNOSIS — H4922 Sixth [abducent] nerve palsy, left eye: Secondary | ICD-10-CM | POA: Diagnosis not present

## 2023-11-17 DIAGNOSIS — H2513 Age-related nuclear cataract, bilateral: Secondary | ICD-10-CM | POA: Diagnosis not present

## 2023-11-21 ENCOUNTER — Telehealth: Payer: Self-pay | Admitting: Internal Medicine

## 2023-11-21 NOTE — Telephone Encounter (Signed)
 I called the patient to discuss the results of her MRI brain with her.  Patient with no acute changes noted on her MRI that would cause her strabismus.  She states that she did follow-up with her ophthalmologist and that they thought it was secondary to weakness of one of her ocular muscles and are referring her to another specialist.  No further workup at this time.  Patient states she has no questions currently.  Patient expresses understanding and is in agreement with plan.

## 2023-12-02 ENCOUNTER — Ambulatory Visit
Admission: RE | Admit: 2023-12-02 | Discharge: 2023-12-02 | Disposition: A | Discharge status: Home / Self Care | Source: Ambulatory Visit | Attending: Family | Admitting: Family

## 2023-12-02 DIAGNOSIS — Z1231 Encounter for screening mammogram for malignant neoplasm of breast: Secondary | ICD-10-CM | POA: Diagnosis not present

## 2023-12-06 ENCOUNTER — Ambulatory Visit (INDEPENDENT_AMBULATORY_CARE_PROVIDER_SITE_OTHER): Payer: Medicare HMO | Admitting: *Deleted

## 2023-12-06 VITALS — Ht 63.0 in | Wt 203.0 lb

## 2023-12-06 DIAGNOSIS — Z0000 Encounter for general adult medical examination without abnormal findings: Secondary | ICD-10-CM | POA: Diagnosis not present

## 2023-12-06 NOTE — Progress Notes (Signed)
 Subjective:   Linda Schmitt is a 68 y.o. who presents for a Medicare Wellness preventive visit.  Visit Complete: Virtual I connected with  Linda Schmitt on 12/06/23 by a audio enabled telemedicine application and verified that I am speaking with the correct person using two identifiers.  Patient Location: Home  Provider Location: Office/Clinic  I discussed the limitations of evaluation and management by telemedicine. The patient expressed understanding and agreed to proceed.  Vital Signs: Because this visit was a virtual/telehealth visit, some criteria may be missing or patient reported. Any vitals not documented were not able to be obtained and vitals that have been documented are patient reported.  VideoDeclined- This patient declined Librarian, academic. Therefore the visit was completed with audio only.  Persons Participating in Visit: Patient.  AWV Questionnaire: No: Patient Medicare AWV questionnaire was not completed prior to this visit.  Cardiac Risk Factors include: advanced age (>43men, >4 women);dyslipidemia;obesity (BMI >30kg/m2)     Objective:    Today's Vitals   12/06/23 1542  Weight: 203 lb (92.1 kg)  Height: 5\' 3"  (1.6 m)   Body mass index is 35.96 kg/m.     12/06/2023    3:53 PM 10/12/2023    9:12 AM 04/06/2023    9:32 AM 12/03/2022    3:23 PM 11/16/2022   10:09 AM 08/27/2022   10:32 AM 08/16/2022   10:41 AM  Advanced Directives  Does Patient Have a Medical Advance Directive? No No No No No No No  Does patient want to make changes to medical advance directive?  No - Patient declined No - Patient declined No - Patient declined No - Patient declined No - Patient declined No - Patient declined  Would patient like information on creating a medical advance directive? No - Patient declined No - Patient declined No - Patient declined   No - Patient declined No - Patient declined    Current Medications (verified) Outpatient  Encounter Medications as of 12/06/2023  Medication Sig   cholecalciferol  (VITAMIN D3) 10 MCG (400 UNIT) TABS tablet Take 800 Units by mouth daily.   cyanocobalamin  1000 MCG tablet Take 400 mcg by mouth daily.   ketotifen  (KP KETOTIFEN  FUMARATE) 0.035 % ophthalmic solution Apply 1 drop to eye in the morning and at bedtime.   pravastatin  (PRAVACHOL ) 40 MG tablet TAKE 1 TABLET BY MOUTH EVERY DAY IN THE EVENING   DULoxetine  (CYMBALTA ) 30 MG capsule TAKE 1 CAPSULE BY MOUTH EVERY DAY (Patient not taking: Reported on 12/06/2023)   gabapentin  (NEURONTIN ) 300 MG capsule Take 1 capsule (300 mg total) by mouth at bedtime. (Patient not taking: Reported on 12/06/2023)   No facility-administered encounter medications on file as of 12/06/2023.    Allergies (verified) Patient has no known allergies.   History: Past Medical History:  Diagnosis Date   Cancer (HCC)    ovarian cancer   Family history of breast cancer    Genetic testing 04/09/2020   No pathogenic variants identified on the [facility] MyRisk (germline) panel. The report date is 04/07/2020.   The [facility] gene panel offered by [employer] includes sequencing and deletion/duplication testing of the following 35 genes: APC, ATM, AXIN2, BARD1, BMPR1A, BRCA1, BRCA2, BRIP1, CHD1, CDK4, CDKN2A, CHEK2, EPCAM (large rearrangement only), HOXB13, GALNT12, MLH1, MSH2, MSH3, MSH6   HLD (hyperlipidemia)    Past Surgical History:  Procedure Laterality Date   CARPAL TUNNEL RELEASE Left    COLONOSCOPY WITH PROPOFOL  N/A 12/10/2020   Procedure: COLONOSCOPY  WITH PROPOFOL ;  Surgeon: Irby Mannan, MD;  Location: [facility] ENDOSCOPY;  Service: Endoscopy;  Laterality: N/A;   ROBOTIC ASSISTED TOTAL HYSTERECTOMY WITH BILATERAL SALPINGO OOPHERECTOMY  06/05/2020   with omentectomy tumor debulking via mini-laparotomy/hand assisted port   SHOULDER ARTHROSCOPY WITH SUBACROMIAL DECOMPRESSION AND OPEN ROTATOR C Right 02/01/2020   Procedure: RIGHT SHOULDER  ARTHROSCOPIC SUBSCAPULARIS REPAIR, ROTATOR CUFF REPAIR, SUABCROMIAL DECOMPRESSION, AND BICEPS TENODESIS.;  Surgeon: Lorri Rota, MD;  Location: [facility] ORS;  Service: Orthopedics;  Laterality: Right;   TUBAL LIGATION     Family History  Problem Relation Age of Onset   Cancer Mother        lymphoma   Stroke Sister    Cancer Brother        lung   Cancer Sister        lung   Lung cancer Brother    Bone cancer Sister    Breast cancer Cousin 45       maternal   Cancer Maternal Aunt        unk types   Social History   Socioeconomic History   Marital status: Single    Spouse name: Not on file   Number of children: Not on file   Years of education: Not on file   Highest education level: Not on file  Occupational History   Not on file  Tobacco Use   Smoking status: Former    Current packs/day: 0.00    Average packs/day: 0.8 packs/day for 40.0 years (30.0 ttl pk-yrs)    Types: Cigarettes    Start date: 02/06/1978    Quit date: 02/06/2018    Years since quitting: 5.8   Smokeless tobacco: Never   Tobacco comments:    quit 02/2018  Vaping Use   Vaping status: Never Used  Substance and Sexual Activity   Alcohol use: Yes    Comment: 2-3 glasses of wine per week,none last 24hrs   Drug use: No   Sexual activity: Not on file  Other Topics Concern   Not on file  Social History Narrative   Lives in [place] alone. Work - [employer]   Diet: Regular   Exercise: walking   Social Drivers of Corporate investment banker Strain: Low Risk  (12/06/2023)   Overall Financial Resource Strain (CARDIA)    Difficulty of Paying Living Expenses: Not hard at all  Food Insecurity: No Food Insecurity (12/06/2023)   Hunger Vital Sign    Worried About Running Out of Food in the Last Year: Never true    Ran Out of Food in the Last Year: Never true  Transportation Needs: No Transportation Needs (12/06/2023)   PRAPARE - Administrator, Civil Service (Medical): No    Lack of Transportation  (Non-Medical): No  Physical Activity: Inactive (12/06/2023)   Exercise Vital Sign    Days of Exercise per Week: 0 days    Minutes of Exercise per Session: 0 min  Stress: No Stress Concern Present (12/06/2023)   [employer] of Occupational Health - Occupational Stress Questionnaire    Feeling of Stress : Not at all  Social Connections: Moderately Isolated (12/06/2023)   Social Connection and Isolation Panel [NHANES]    Frequency of Communication with Friends and Family: More than three times a week    Frequency of Social Gatherings with Friends and Family: Twice a week    Attends Religious Services: More than 4 times per year    Active Member of Clubs or Organizations: No  Attends Banker Meetings: Never    Marital Status: Widowed    Tobacco Counseling Counseling given: Not Answered Tobacco comments: quit 02/2018    Clinical Intake:  Pre-visit preparation completed: Yes  Pain : No/denies pain     BMI - recorded: 35.96 Nutritional Status: BMI > 30  Obese Nutritional Risks: None Diabetes: No  Lab Results  Component Value Date   HGBA1C 6.1 08/20/2021   HGBA1C 6.1 12/28/2019   HGBA1C 5.7 (A) 04/21/2018     How often do you need to have someone help you when you read instructions, pamphlets, or other written materials from your doctor or pharmacy?: 1 - Never  Interpreter Needed?: No  Information entered by :: R. Lene Mckay LPN   Activities of Daily Living     12/06/2023    3:43 PM  In your present state of health, do you have any difficulty performing the following activities:  Hearing? 0  Vision? 0  Comment readers  Difficulty concentrating or making decisions? 0  Walking or climbing stairs? 0  Dressing or bathing? 0  Doing errands, shopping? 0  Preparing Food and eating ? N  Using the Toilet? N  In the past six months, have you accidently leaked urine? N  Do you have problems with loss of bowel control? N  Managing your Medications? N   Managing your Finances? N  Housekeeping or managing your Housekeeping? N    Patient Care Team: Calista Catching, FNP as PCP - General (Family Medicine) Rochell Chroman, RN as Oncology Nurse Navigator Timmy Forbes, MD as Consulting Physician (Oncology) Hermine Loots, MD as Referring Physician (Gynecologic Oncology) Nobie Batch, MD as Referring Physician (Gynecologic Oncology) Irby Mannan, MD (Inactive) as Consulting Physician (Gastroenterology)  Indicate any recent Medical Services you may have received from other than [facility] providers in the past year (date may be approximate).     Assessment:   This is a routine wellness examination for [place].  Hearing/Vision screen Hearing Screening - Comments:: No issues Vision Screening - Comments:: readers   Goals Addressed             This Visit's Progress    Patient Stated       Wants to start back at the GYM       Depression Screen     12/06/2023    3:48 PM 11/07/2023    2:41 PM 12/03/2022    3:21 PM 10/08/2022    1:20 PM 11/27/2021    8:38 AM 02/22/2020   11:21 AM 11/21/2019    9:49 AM  PHQ 2/9 Scores  PHQ - 2 Score 0 0 0 0 0 0 0  PHQ- 9 Score 0          Fall Risk     12/06/2023    3:46 PM 11/07/2023    2:40 PM 12/03/2022    3:24 PM 10/08/2022    1:20 PM 11/27/2021    8:41 AM  Fall Risk   Falls in the past year? 0 0 0 0 0  Number falls in past yr: 0 0 0 0 0  Injury with Fall? 0 0 0 0   Risk for fall due to : No Fall Risks No Fall Risks  No Fall Risks   Follow up Falls prevention discussed;Falls evaluation completed Falls evaluation completed Falls evaluation completed;Falls prevention discussed Falls evaluation completed Falls evaluation completed    MEDICARE RISK AT HOME:  Medicare Risk at Home Any stairs in or around  the home?: Yes If so, are there any without handrails?: No Home free of loose throw rugs in walkways, pet beds, electrical cords, etc?: Yes Adequate lighting in your home to  reduce risk of falls?: Yes Life alert?: No Use of a cane, walker or w/c?: No Grab bars in the bathroom?: No Shower chair or bench in shower?: No Elevated toilet seat or a handicapped toilet?: Yes  TIMED UP AND GO:  Was the test performed?  No  Cognitive Function: 6CIT completed        12/06/2023    3:53 PM 12/03/2022    3:32 PM 11/21/2019    9:55 AM  6CIT Screen  What Year? 0 points 0 points 0 points  What month? 0 points 0 points 0 points  What time? 0 points 0 points 0 points  Count back from 20 0 points 0 points 0 points  Months in reverse 0 points 0 points 0 points  Repeat phrase 0 points 0 points 4 points  Total Score 0 points 0 points 4 points    Immunizations Immunization History  Administered Date(s) Administered   PFIZER Comirnaty(Gray Top)Covid-19 Tri-Sucrose Vaccine 05/08/2020   PFIZER(Purple Top)SARS-COV-2 Vaccination 05/08/2020, 05/29/2020   Tdap 12/24/2011    Screening Tests Health Maintenance  Topic Date Due   DTaP/Tdap/Td (2 - Td or Tdap) 12/23/2021   COVID-19 Vaccine (4 - 2024-25 season) 04/10/2023   Medicare Annual Wellness (AWV)  12/03/2023   INFLUENZA VACCINE  03/09/2024   Lung Cancer Screening  10/25/2024   MAMMOGRAM  12/01/2025   Colonoscopy  12/11/2030   DEXA SCAN  Completed   Hepatitis C Screening  Completed   HPV VACCINES  Aged Out   Meningococcal B Vaccine  Aged Out   Pneumonia Vaccine 10+ Years old  Discontinued   Zoster Vaccines- Shingrix  Discontinued    Health Maintenance  Health Maintenance Due  Topic Date Due   DTaP/Tdap/Td (2 - Td or Tdap) 12/23/2021   COVID-19 Vaccine (4 - 2024-25 season) 04/10/2023   Medicare Annual Wellness (AWV)  12/03/2023   Health Maintenance Items Addressed: Patient declines vaccines.  Additional Screening:  Vision Screening: Recommended annual ophthalmology exams for early detection of glaucoma and other disorders of the eye. Up to date  [facility] Screening: Recommended annual dental  exams for proper oral hygiene  Community Resource Referral / Chronic Care Management: CRR required this visit?  No   CCM required this visit?  No     Plan:     I have personally reviewed and noted the following in the patient's chart:   Medical and social history Use of alcohol, tobacco or illicit drugs  Current medications and supplements including opioid prescriptions. Patient is not currently taking opioid prescriptions. Functional ability and status Nutritional status Physical activity Advanced directives List of other physicians Hospitalizations, surgeries, and ER visits in previous 12 months Vitals Screenings to include cognitive, depression, and falls Referrals and appointments  In addition, I have reviewed and discussed with patient certain preventive protocols, quality metrics, and best practice recommendations. A written personalized care plan for preventive services as well as general preventive health recommendations were provided to patient.     Felicitas Horse, LPN   04/06/5620   After Visit Summary: (MyChart) Due to this being a telephonic visit, the after visit summary with patients personalized plan was offered to patient via MyChart   Notes: Nothing significant to report at this time.

## 2023-12-06 NOTE — Patient Instructions (Signed)
 Linda Schmitt , Thank you for taking time to come for your Medicare Wellness Visit. I appreciate your ongoing commitment to your health goals. Please review the following plan we discussed and let me know if I can assist you in the future.   Referrals/Orders/Follow-Ups/Clinician Recommendations: Consider updating your vaccines.  This is a list of the screening recommended for you and due dates:  Health Maintenance  Topic Date Due   DTaP/Tdap/Td vaccine (2 - Td or Tdap) 12/23/2021   COVID-19 Vaccine (4 - 2024-25 season) 04/10/2023   Flu Shot  03/09/2024   Screening for Lung Cancer  10/25/2024   Medicare Annual Wellness Visit  12/05/2024   Mammogram  12/01/2025   Colon Cancer Screening  12/11/2030   DEXA scan (bone density measurement)  Completed   Hepatitis C Screening  Completed   HPV Vaccine  Aged Out   Meningitis B Vaccine  Aged Out   Pneumonia Vaccine  Discontinued   Zoster (Shingles) Vaccine  Discontinued    Advanced directives: (Provided) Advance directive discussed with you today. I have provided a copy for you to complete at home and have notarized. Once this is complete, please bring a copy in to our office so we can scan it into your chart.  Copy mailed to patient.  Next Medicare Annual Wellness Visit scheduled for next year: Yes 12/10/24 @ 3:40

## 2023-12-08 DIAGNOSIS — H5010 Unspecified exotropia: Secondary | ICD-10-CM | POA: Diagnosis not present

## 2023-12-08 DIAGNOSIS — H532 Diplopia: Secondary | ICD-10-CM | POA: Diagnosis not present

## 2024-03-27 DIAGNOSIS — H532 Diplopia: Secondary | ICD-10-CM | POA: Diagnosis not present

## 2024-03-27 DIAGNOSIS — H5010 Unspecified exotropia: Secondary | ICD-10-CM | POA: Diagnosis not present

## 2024-04-10 ENCOUNTER — Other Ambulatory Visit: Payer: Self-pay

## 2024-04-10 DIAGNOSIS — C5700 Malignant neoplasm of unspecified fallopian tube: Secondary | ICD-10-CM

## 2024-04-11 ENCOUNTER — Encounter: Payer: Self-pay | Admitting: Obstetrics and Gynecology

## 2024-04-11 ENCOUNTER — Inpatient Hospital Stay: Admitting: Obstetrics and Gynecology

## 2024-04-11 ENCOUNTER — Inpatient Hospital Stay: Attending: Obstetrics and Gynecology

## 2024-04-11 VITALS — BP 152/81 | HR 66 | Temp 97.3°F | Resp 19 | Wt 231.1 lb

## 2024-04-11 DIAGNOSIS — Z9079 Acquired absence of other genital organ(s): Secondary | ICD-10-CM | POA: Insufficient documentation

## 2024-04-11 DIAGNOSIS — C5700 Malignant neoplasm of unspecified fallopian tube: Secondary | ICD-10-CM

## 2024-04-11 DIAGNOSIS — Z9221 Personal history of antineoplastic chemotherapy: Secondary | ICD-10-CM | POA: Diagnosis not present

## 2024-04-11 DIAGNOSIS — Z90710 Acquired absence of both cervix and uterus: Secondary | ICD-10-CM | POA: Diagnosis not present

## 2024-04-11 DIAGNOSIS — Z8544 Personal history of malignant neoplasm of other female genital organs: Secondary | ICD-10-CM

## 2024-04-11 DIAGNOSIS — Z08 Encounter for follow-up examination after completed treatment for malignant neoplasm: Secondary | ICD-10-CM | POA: Insufficient documentation

## 2024-04-11 DIAGNOSIS — Z90722 Acquired absence of ovaries, bilateral: Secondary | ICD-10-CM | POA: Insufficient documentation

## 2024-04-11 NOTE — Progress Notes (Signed)
 Gynecologic Oncology Interval Visit  [facility]  [phone] [phone]  Patient Care Team: Dineen Rollene MATSU, FNP as PCP - General (Family Medicine) Maurie Rayfield BIRCH, RN as Oncology Nurse Navigator Babara Call, MD as Consulting Physician (Oncology) Mancil Barter, MD as Referring Physician (Gynecologic Oncology) Elby Webb Loges, MD as Referring Physician (Gynecologic Oncology) Janalyn Keene NOVAK, MD (Inactive) as Consulting Physician (Gastroenterology)   Name of the patient: Linda Schmitt  [id number]  08-06-1956   Date of visit: 04/11/2024  Chief Complaint: 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 Babara  Subjective:  Linda Schmitt is a 68 y.o. G1P1 female, initially seen in consultation from Dr. 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   She had imaging 3/25 with Dr Babara- CT C/A/P wo contrast that was negative for recurrent or metastatic disease. Olaparib  well without significant side effects, but discontinued in 7/24 after 2.5 years.   Gynecologic Oncology History:  Linda Schmitt is a 68 y.o. female, initially seen in consultation from Dr. 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 Her history is as follows:  She presented with malignant pleural effusion two weeks after elective right shoulder surgery. She underwent thoracentesis on 02/18/2020 and 02/19/2020 with 1.8L and 1.2L removed respectively. Cytology was positive for PAX 8. PET scan showed evidence of peritoneal disease within the abdomen, soft tissue infiltrating into the omentum, large loculated left pleural effusion with thin FDG avid rind of soft tissue overlying the left lung.  Small right pleural effusion with mild FDG uptake is equivocal for malignant effusion of the right hemithorax. Fluid density structure within right side of pelvis abutting the right sided uterus noted though indeterminate. Pelvic u/s obtained showed heterogeneous myometrium with poor definition of endometrial complex. Normal left ovary.  No cystic right adnexal lesion is identified.  No a probable small normal-appearing right ovary is identified.  Suspect that the low-attenuation presumed cystic structure seen in the right adnexa on the prior image corresponds to a small amount of nonspecific free fluid in the right adnexa.  03/05/20 CT-GUIDED BIOPSY: HIGH-GRADE SEROUS CARCINOMA.   Given that cytology from left pleural effusion is positive for metastatic carcinoma, tumor cells are positive for PAX 8 which indicates GYN origin.  Given that patient has moderate to large amount of pleural effusion, heavy tumor burden, recommend to start chemotherapy as soon as possible with carboplatin  AUC 5-6 and paclitaxel  175 mg/m every 3 weeks for 3 cycles.  Patient will establish care with gynecology oncology for evaluation of participating in clinical trials after neoadjuvant chemo/surgery. CA 125 was 53.6 at time of diagnosis.   Treatment Summary: 03/04/20 Cycle 1 carboplatin -paclitaxel  CA 125 53.6 03/25/20 Cycle 2 carboplatin -paclitaxel  CA 125 39.9 04/15/20  Cycle 3 carboplatin -paclitaxel  CA 125 10.5  04/25/20 CT C/A/P revealed moderate left malignant pleural effusion with some pleural thickening and enhancement which was decreased in size compared to prior exam. Omental caking apparent concerning for intraperitoneal metastatic disease. No definite primary malignancy identified. Stable left adrenal nodule. Aortic atherosclerosis.   05/06/2020 Cycle 4 carboplatin -paclitaxel  CA125 5.8  On 06/05/2020 she underwent exam under anesthesia, Diagnostic laparoscopy, Robot-assisted total laparoscopic hysterectomy, Bilateral  salpingo-oophorectomy, Omentectomy via mini-laparotomy/hand assisted port, and Optimal (R<1) interval tumor debulking. Pathology c/w tubal origin.   Pathology: High  grade serous adenocarcinoma involving bilateral ovaries and fallopian tubes; peritoneal nodules, cul-de-sac and omentum (primary site left tube). p53, p16 (strong, patchy), Ki67 (elevated)  11/22/2021Cycle #5 paclitaxel  and carboplatin  CA125 4.6 07/22/2020 Cycle #6 paclitaxel  and carboplatin   CA125 4.16 August 2020 olaparib  maintenance initiated  10/29/2020 Patient complaining of pelvic pain/pressure as well as constipation at GYN ONC visit today. CTA ordered and completed on 11/14/2020. Included below.  IMPRESSION: 1. Status post interval hysterectomy and bilateral salpingectomy. 2. Status post interval omentectomy. There is no residual suspicious peritoneal nodularity identified. Unchanged small nodules adjacent to the spleen which are stable compared to examinations dating back to 2017 and likely small splenules and/or lymph nodes. Attention on follow-up  3. Interval resolution of a previously seen moderate left pleural effusion. There is minimal residual pleural thickening.  4. Findings are consistent with treatment response. 5. No evidence of new metastatic disease in the chest, abdomen, or pelvis.  03/18/2021, CT showed no obvious cancer metastasis or recurrence. There are borderline enlarged mediastinal lymph node and a right hilar node.  Attention on follow-up   07/14/2021 CT chest abdomen pelvis with contrast showed no evidence of recurrent or metastatic disease.  Borderline enlarged mediastinal lymph node and hilar lymphadenopathy, attention on follow-up.  Stable  Follow up CT 04/07/22 for restaging with Dr Babara. No evidence of recurrent or metastatic disease. Multiple adjacent nodules to spleen, thought to be small accessory splenules, not previously FDG avid. Minimal emphysema. Aortic atherosclerosis and emphysema.     CT 12/22/22  IMPRESSION: 1. Subtle areas of new bilateral peribronchovascular ground-glass and ground-glass nodularity, suspicious for mild infection, including atypical etiologies. 2. No evidence of metastatic disease, status post hysterectomy. 3. Left adrenal nodule which is similar back to 2021 PET and can be presumed benign. Likely a lipid poor adenoma. No specific follow-up indicated. 4.  Aortic Atherosclerosis (ICD10-I70.0).  04/21/23 CT CAP IMPRESSION: 1. Stable examination without evidence of local recurrence or metastatic disease in the chest, abdomen or pelvis. 2. Stable 2 mm pulmonary nodule in the posterior left upper lobe, considered benign. 3. Stable 16 mm presumed lipid poor left adrenal adenoma. 4.  Aortic Atherosclerosis (ICD10-I70.0).    CA 125 has been followed since diagnosis 02/26/2020  53.6 (high)  03/25/2020  39.9  04/15/2020  10.5 (first normal) 05/06/2020  5.8  06/30/2020  4.6 07/22/2020  4.8 08/12/2020  4.3 09/02/2020  4.3 09/23/2020  4.7  10/21/2020  6.1 11/18/2020  6.9 12/16/2020  4.7 01/20/2021  3.4 02/16/2021  4.3 03/16/2021  4.9 05/04/2021  3.9 06/01/2021  3.8  07/13/2021  4.1 08/31/2021  4.7 10/13/2021  5.2 05/11/22 3.6 09/29/22 4.9 01/18/23  4.0 04/21/23 3.5 10/26/23 3.5  GENETIC TEST RESULTS:   Germline Testing: 04/07/20- negative. No clinically significant mutation identified. Breast Cancer Riskscore remaining life time risk- 7%.   Somatic Testing: Genetic testing reported out on 04/08/2020 through the [facility] HRD. No pathogenic variants identified on the [facility] (germline) panel. Pathogenic variant identified through [facility] (HRD testing) in BRCA1 called [id number], HRD positive.    The [facility]  gene panel offered by [employer] includes sequencing and deletion/duplication testing of the following 35 genes: APC, ATM, AXIN2, BARD1, BMPR1A, BRCA1, BRCA2, BRIP1, CHD1, CDK4, CDKN2A, CHEK2, EPCAM  (large rearrangement only), HOXB13, GALNT12, MLH1, MSH2, MSH3, MSH6, MUTYH, NBN, NTHL1, PALB2, PMS2, PTEN, RAD51C, RAD51D, RNF43, RPS20, SMAD4, STK11, and TP53. Sequencing was performed for select regions of POLE and POLD1, and large rearrangement analysis was performed for select  regions of GREM1.   Health Maintenance Last Screening Mammogram 11/09/2020. BI-RADS CATEGORY  1: Negative. Repeat screening mammogram in 1 year.   Last Colonoscopy completed 12/10/2020.   Problem List: Patient Active Problem List   Diagnosis Date Noted   Strabismus 11/07/2023   Allergic conjunctivitis 11/07/2023   Macrocytic anemia 01/26/2023   Hypocalcemia 09/29/2022   Neuropathy 04/09/2022   CKD (chronic kidney disease) stage 3, GFR 30-59 ml/min (HCC) 03/08/2022   Hypokalemia 01/28/2022   Osteopenia 11/18/2021   Screen for colon cancer    Polyp of colon    Normocytic anemia 06/30/2020   Carcinoma of fallopian tube (HCC) 06/25/2020   BRCA1 gene mutation positive 04/11/2020   Genetic testing 04/09/2020   Serous carcinoma of female pelvis (HCC) 03/20/2020   Family history of breast cancer    Encounter for antineoplastic chemotherapy 03/04/2020   Chemotherapy-induced neuropathy (HCC) 03/04/2020   Mass of omentum 02/28/2020   Goals of care, counseling/discussion 02/26/2020   Metastatic carcinoma (HCC) 02/26/2020   Status post thoracentesis    Malignant pleural effusion    Pleural effusion 02/17/2020   Mass of arm, right 01/22/2020   Elevated blood pressure reading 12/28/2019   Hand tingling 12/28/2019   Acute pain of right shoulder 12/01/2018   Adrenal adenoma, left 11/11/2017   Aortic atherosclerosis (HCC) 09/23/2017   Prediabetes 07/09/2017   Ganglion of right wrist 07/06/2017   Hyperlipidemia 10/11/2016   Former smoker 09/21/2016   Depression, recurrent (HCC) 09/14/2016   Headache 09/14/2016   Routine general medical examination at a health care facility 01/14/2015   Screening for breast  cancer 11/12/2013    Past Medical History: Past Medical History:  Diagnosis Date   Cancer [facility])    ovarian cancer   Family history of breast cancer    Genetic testing 04/09/2020   No pathogenic variants identified on the [facility] (germline) panel. The report date is 04/07/2020.   The [facility] gene panel offered by [employer] includes sequencing and deletion/duplication testing of the following 35 genes: APC, ATM, AXIN2, BARD1, BMPR1A, BRCA1, BRCA2, BRIP1, CHD1, CDK4, CDKN2A, CHEK2, EPCAM (large rearrangement only), HOXB13, GALNT12, MLH1, MSH2, MSH3, MSH6   HLD (hyperlipidemia)     Past Surgical History: Past Surgical History:  Procedure Laterality Date   CARPAL TUNNEL RELEASE Left    COLONOSCOPY WITH PROPOFOL  N/A 12/10/2020   Procedure: COLONOSCOPY WITH PROPOFOL ;  Surgeon: Janalyn Keene NOVAK, MD;  Location: [facility];  Service: Endoscopy;  Laterality: N/A;   ROBOTIC ASSISTED TOTAL HYSTERECTOMY WITH BILATERAL SALPINGO OOPHERECTOMY  06/05/2020   with omentectomy tumor debulking via mini-laparotomy/hand assisted port   SHOULDER ARTHROSCOPY WITH SUBACROMIAL DECOMPRESSION AND OPEN ROTATOR C Right 02/01/2020   Procedure: RIGHT SHOULDER ARTHROSCOPIC SUBSCAPULARIS REPAIR, ROTATOR CUFF REPAIR, SUABCROMIAL DECOMPRESSION, AND BICEPS TENODESIS.;  Surgeon: Tobie Priest, MD;  Location: [facility] ORS;  Service: Orthopedics;  Laterality: Right;   TUBAL LIGATION      Past Gynecologic History: as per HPI  OB History:  OB History  Gravida Para Term Preterm AB Living  1 1    1   SAB IAB Ectopic Multiple Live Births          # Outcome Date GA Lbr Len/2nd Weight Sex Type Anes PTL Lv  1 Para             Family History: Family History  Problem Relation Age of Onset   Cancer Mother        lymphoma   Stroke Sister    Cancer Brother  lung   Cancer Sister        lung   Lung cancer Brother    Bone cancer Sister    Breast cancer Cousin 12       maternal   Cancer  Maternal Aunt        unk types  Family history significant for lung cancer, lymphoma, and breast cancer.   Social History: Social History   Socioeconomic History   Marital status: Single    Spouse name: Not on file   Number of children: Not on file   Years of education: Not on file   Highest education level: Not on file  Occupational History   Not on file  Tobacco Use   Smoking status: Former    Current packs/day: 0.00    Average packs/day: 0.8 packs/day for 40.0 years (30.0 ttl pk-yrs)    Types: Cigarettes    Start date: 02/06/1978    Quit date: 02/06/2018    Years since quitting: 6.1   Smokeless tobacco: Never   Tobacco comments:    quit 02/2018  Vaping Use   Vaping status: Never Used  Substance and Sexual Activity   Alcohol use: Yes    Comment: 2-3 glasses of wine per week,none last 24hrs   Drug use: No   Sexual activity: Not on file  Other Topics Concern   Not on file  Social History Narrative   Lives in [place] alone. Work - [employer]   Diet: Regular   Exercise: walking   Social Drivers of Corporate investment banker Strain: Low Risk  (12/06/2023)   Overall Financial Resource Strain (CARDIA)    Difficulty of Paying Living Expenses: Not hard at all  Food Insecurity: No Food Insecurity (12/06/2023)   Hunger Vital Sign    Worried About Running Out of Food in the Last Year: Never true    Ran Out of Food in the Last Year: Never true  Transportation Needs: No Transportation Needs (12/06/2023)   PRAPARE - Administrator, Civil Service (Medical): No    Lack of Transportation (Non-Medical): No  Physical Activity: Inactive (12/06/2023)   Exercise Vital Sign    Days of Exercise per Week: 0 days    Minutes of Exercise per Session: 0 min  Stress: No Stress Concern Present (12/06/2023)   [employer] of Occupational Health - Occupational Stress Questionnaire    Feeling of Stress : Not at all  Social Connections: Moderately Isolated (12/06/2023)   Social  Connection and Isolation Panel    Frequency of Communication with Friends and Family: More than three times a week    Frequency of Social Gatherings with Friends and Family: Twice a week    Attends Religious Services: More than 4 times per year    Active Member of [employer] or Organizations: No    Attends Banker Meetings: Never    Marital Status: Widowed  Intimate Partner Violence: Not At Risk (12/06/2023)   Humiliation, Afraid, Rape, and Kick questionnaire    Fear of Current or Ex-Partner: No    Emotionally Abused: No    Physically Abused: No    Sexually Abused: No   Immunization History  Administered Date(s) Administered   PFIZER Comirnaty(Gray Top)Covid-19 Tri-Sucrose Vaccine 05/08/2020   PFIZER(Purple Top)SARS-COV-2 Vaccination 05/08/2020, 05/29/2020   Tdap 12/24/2011   Allergies: No Known Allergies  Current Medications: Current Outpatient Medications  Medication Sig Dispense Refill   cholecalciferol  (VITAMIN D3) 10 MCG (400 UNIT) TABS tablet Take 800 Units by  mouth daily.     cyanocobalamin  1000 MCG tablet Take 400 mcg by mouth daily.     DULoxetine  (CYMBALTA ) 30 MG capsule TAKE 1 CAPSULE BY MOUTH EVERY DAY (Patient not taking: Reported on 12/06/2023) 90 capsule 2   gabapentin  (NEURONTIN ) 300 MG capsule Take 1 capsule (300 mg total) by mouth at bedtime. (Patient not taking: Reported on 12/06/2023) 60 capsule 2   ketotifen  (KP KETOTIFEN  FUMARATE) 0.035 % ophthalmic solution Apply 1 drop to eye in the morning and at bedtime. 5 mL 0   pravastatin  (PRAVACHOL ) 40 MG tablet TAKE 1 TABLET BY MOUTH EVERY DAY IN THE EVENING 90 tablet 3   No current facility-administered medications for this visit.   Immunization History  Administered Date(s) Administered   PFIZER Comirnaty(Gray Top)Covid-19 Tri-Sucrose Vaccine 05/08/2020   PFIZER(Purple Top)SARS-COV-2 Vaccination 05/08/2020, 05/29/2020   Tdap 12/24/2011   Review of Systems General:  no complaints Skin: no  complaints Eyes: no complaints HEENT: no complaints Breasts: no complaints Pulmonary: no complaints Cardiac: no complaints Gastrointestinal: no complaints Genitourinary/Sexual: no complaints Ob/Gyn: no complaints Musculoskeletal: no complaints Hematology: no complaints Neurologic/Psych: no complaints  Objective:  Physical Examination:  There were no vitals taken for this visit.    ECOG Performance Status: 0 - Asymptomatic  GENERAL: Patient is a well appearing female in no acute distress HEENT:  Atraumatic and normocephalic. PERRL, neck supple. NODES:  No cervical, supraclavicular, axillary, or inguinal lymphadenopathy palpated.  LUNGS:  Normal respiratory pattern ABDOMEN:  Soft, nontender. Nondistended. No masses/ascites/hernia/or hepatomegaly.  EXTREMITIES:  No peripheral edema.   SKIN:  Clear with no obvious rashes or skin changes. No nail dyscrasia. NEURO:  Nonfocal. Well oriented.  Appropriate affect.  Pelvic: Exam chaperoned by CMA EGBUS: no lesions Cervix: surgically absent Vagina: no lesions, no discharge or bleeding Uterus: surgically absent BME: no palpable masses Rectovaginal: deferred   Lab Review As per HPI  Radiologic Imaging: Per HPI    Assessment:  Linda Schmitt is a 68 y.o. female diagnosed with stage IV high grade serous fallopian tube cancer based on malignant pleural effusion 7/21. CA 125 at diagnosis was 53. Underwent NACT with paclitaxel /carboplatin  for 4 cycles and MIS optimal interval debulking on 06/05/2020.  Completed 3 cycles of adjuvant carboplatin -taxol  chemotherapy on 08/12/20, on maintenance olaparib  for 2.5 years until 7/24 with Dr Babara.  CT scan negative 3/25. NED on exam today.     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  No falls or problems with buttons.   Medical co-morbidities complicating care: There is no height or weight on file to calculate BMI. Plan:   Problem List Items  Addressed This Visit       Genitourinary   Carcinoma of fallopian tube (HCC) - Primary (Chronic)    CA125 pending.  If stable, will continue follow up with Dr Babara in 3 months and us  in 6 months with CA125. Discussed with Dr Babara that routine surveillance CT scans are not needed at this point unless symptomatic or CA125 starts to rise.      Prentice Agent, MD

## 2024-04-12 LAB — CA 125: Cancer Antigen (CA) 125: 3.7 U/mL (ref 0.0–38.1)

## 2024-05-04 ENCOUNTER — Other Ambulatory Visit

## 2024-05-10 ENCOUNTER — Ambulatory Visit: Admitting: Oncology

## 2024-06-28 DIAGNOSIS — H5010 Unspecified exotropia: Secondary | ICD-10-CM | POA: Diagnosis not present

## 2024-06-28 DIAGNOSIS — H5333 Simultaneous visual perception without fusion: Secondary | ICD-10-CM | POA: Diagnosis not present

## 2024-07-11 ENCOUNTER — Inpatient Hospital Stay

## 2024-07-11 ENCOUNTER — Inpatient Hospital Stay: Attending: Obstetrics and Gynecology | Admitting: Oncology

## 2024-07-11 ENCOUNTER — Encounter: Payer: Self-pay | Admitting: Oncology

## 2024-07-11 VITALS — BP 124/64 | HR 68 | Temp 98.0°F | Resp 18 | Wt 231.2 lb

## 2024-07-11 DIAGNOSIS — Z801 Family history of malignant neoplasm of trachea, bronchus and lung: Secondary | ICD-10-CM | POA: Diagnosis not present

## 2024-07-11 DIAGNOSIS — C5700 Malignant neoplasm of unspecified fallopian tube: Secondary | ICD-10-CM | POA: Diagnosis not present

## 2024-07-11 DIAGNOSIS — Z807 Family history of other malignant neoplasms of lymphoid, hematopoietic and related tissues: Secondary | ICD-10-CM | POA: Insufficient documentation

## 2024-07-11 DIAGNOSIS — Z9221 Personal history of antineoplastic chemotherapy: Secondary | ICD-10-CM | POA: Insufficient documentation

## 2024-07-11 DIAGNOSIS — Z79899 Other long term (current) drug therapy: Secondary | ICD-10-CM | POA: Insufficient documentation

## 2024-07-11 DIAGNOSIS — G629 Polyneuropathy, unspecified: Secondary | ICD-10-CM | POA: Diagnosis not present

## 2024-07-11 DIAGNOSIS — Z808 Family history of malignant neoplasm of other organs or systems: Secondary | ICD-10-CM | POA: Diagnosis not present

## 2024-07-11 DIAGNOSIS — N1831 Chronic kidney disease, stage 3a: Secondary | ICD-10-CM | POA: Diagnosis not present

## 2024-07-11 DIAGNOSIS — Z87891 Personal history of nicotine dependence: Secondary | ICD-10-CM | POA: Diagnosis not present

## 2024-07-11 DIAGNOSIS — Z803 Family history of malignant neoplasm of breast: Secondary | ICD-10-CM | POA: Diagnosis not present

## 2024-07-11 LAB — CMP (CANCER CENTER ONLY)
ALT: 18 U/L (ref 0–44)
AST: 20 U/L (ref 15–41)
Albumin: 4.2 g/dL (ref 3.5–5.0)
Alkaline Phosphatase: 63 U/L (ref 38–126)
Anion gap: 11 (ref 5–15)
BUN: 10 mg/dL (ref 8–23)
CO2: 24 mmol/L (ref 22–32)
Calcium: 9.4 mg/dL (ref 8.9–10.3)
Chloride: 107 mmol/L (ref 98–111)
Creatinine: 0.77 mg/dL (ref 0.44–1.00)
GFR, Estimated: >60 mL/min (ref >60)
Glucose, Bld: 143 mg/dL — ABNORMAL HIGH (ref 70–99)
Potassium: 3.7 mmol/L (ref 3.5–5.1)
Sodium: 141 mmol/L (ref 135–145)
Total Bilirubin: 0.4 mg/dL (ref 0.0–1.2)
Total Protein: 6.8 g/dL (ref 6.5–8.1)

## 2024-07-11 LAB — CBC WITH DIFFERENTIAL (CANCER CENTER ONLY)
Abs Immature Granulocytes: 0.02 K/uL (ref 0.00–0.07)
Basophils Absolute: 0 K/uL (ref 0.0–0.1)
Basophils Relative: 0 %
Eosinophils Absolute: 0.1 K/uL (ref 0.0–0.5)
Eosinophils Relative: 2 %
HCT: 38.4 % (ref 36.0–46.0)
Hemoglobin: 12.5 g/dL (ref 12.0–15.0)
Immature Granulocytes: 0 %
Lymphocytes Relative: 36 %
Lymphs Abs: 2 K/uL (ref 0.7–4.0)
MCH: 29.6 pg (ref 26.0–34.0)
MCHC: 32.6 g/dL (ref 30.0–36.0)
MCV: 91 fL (ref 80.0–100.0)
Monocytes Absolute: 0.5 K/uL (ref 0.1–1.0)
Monocytes Relative: 9 %
Neutro Abs: 3 K/uL (ref 1.7–7.7)
Neutrophils Relative %: 53 %
Platelet Count: 153 K/uL (ref 150–400)
RBC: 4.22 MIL/uL (ref 3.87–5.11)
RDW: 13.8 % (ref 11.5–15.5)
WBC Count: 5.7 K/uL (ref 4.0–10.5)
nRBC: 0 % (ref 0.0–0.2)

## 2024-07-11 NOTE — Assessment & Plan Note (Signed)
 She did not tolerate Lyrica .  Acupuncture did not work  Previously self discontinued gabapentin  300mg  QHS and Cymbalta  30mg  daily.  She currently not taking any treatments.

## 2024-07-11 NOTE — Assessment & Plan Note (Addendum)
+   pleural effusion cytology  -( somatic BRCA1 positive, HRD positive). S/p 4 cycles of neoadjuvant chemotherapy  Carboplatin  /Taxol   S/p total hysterectomy with salpingo-oophorectomy and omentectomy. Optimal (R<1) interval tumor debulking, -followed by 3 cycles of adjuvant carboplatin /Taxol . S/p 2.5 years of Olarparib 300mg  daily, stopped in July/August 2024   Labs reviewed and discussed with patient.  CA125 is stable. Discussed with GynOnc. Will hold off surveillance imaging, continue monitor CA 125

## 2024-07-11 NOTE — Assessment & Plan Note (Signed)
 Encourage oral hydration and avoid nephrotoxins.

## 2024-07-11 NOTE — Progress Notes (Signed)
 Hematology/Oncology Progress note [phone] [phone]   REASON FOR VISIT Follow up for High grade serous adenocarcinoma.    ASSESSMENT & PLAN:   Cancer Staging  Carcinoma of fallopian tube [facility]) Staging form: Ovary, Fallopian Tube, and Primary Peritoneal Carcinoma, AJCC 8th Edition - Pathologic stage from 02/18/2020: FIGO Stage IVA (pM1a) - Signed by Babara Call, MD on 08/12/2020   Carcinoma of fallopian tube (HCC) + pleural effusion cytology  -( somatic BRCA1 positive, HRD positive). S/p 4 cycles of neoadjuvant chemotherapy  Carboplatin  /Taxol   S/p total hysterectomy with salpingo-oophorectomy and omentectomy. Optimal (R<1) interval tumor debulking, -followed by 3 cycles of adjuvant carboplatin /Taxol . S/p 2.5 years of Olarparib 300mg  daily, stopped in July/August 2024   Labs reviewed and discussed with patient.  CA125 is stable. Discussed with GynOnc. Will hold off surveillance imaging, continue monitor CA 125  Hypocalcemia Recommend calcium and vitamin D  supplementation. Calcium level has improved  Neuropathy She did not tolerate Lyrica .  Acupuncture did not work  Previously self discontinued gabapentin  300mg  QHS and Cymbalta  30mg  daily.  She currently not taking any treatments.       Orders Placed This Encounter  Procedures   CBC with Differential (Cancer Center Only)    Standing Status:   Future    Expected Date:   01/09/2025    Expiration Date:   04/09/2025   CMP (Cancer Center only)    Standing Status:   Future    Expected Date:   01/09/2025    Expiration Date:   04/09/2025   CA 125    Standing Status:   Future    Expected Date:   01/09/2025    Expiration Date:   04/09/2025   Follow up in 6  months  All questions were answered. The patient knows to call the clinic with any problems, questions or concerns.  Call Babara, MD, PhD [facility] Hematology  Oncology 07/11/2024    PERTINENT ONCOLOGY HISTORY Linda Schmitt is a 68 y.o.afemale who has above oncology history reviewed by me presents for follow up of carcinoma of fallopian tube   Oncology History  Carcinoma of fallopian tube (HCC)  02/18/2020 Cancer Staging    -status post thoracentesis x2 during her recent admission. Cytology was positive for metastatic carcinoma.  TTF-1 Napsin A, GATA3, CDX2 were negative. Positive for PAX 8, which is most often expressing tumors of gynecological and renal origin Staging form: Ovary, Fallopian Tube, and Primary Peritoneal Carcinoma, AJCC 8th Edition - Pathologic stage from 02/18/2020: FIGO Stage IVA (pM1a) - Signed by Babara Call, MD on 08/12/2020   02/25/2020 Imaging   PET scan initial staging 1. Exam shows evidence of peritoneal disease within the abdomen including FDG avid soft tissue infiltration into the omentum.  2. Large, loculated left pleural effusion with thin, FDG avid rind of soft tissue overlying the left lung. This corresponds to known, pathology proven malignant pleural effusion. 3. Small right pleural effusion with mild FDG uptake is equivocal for malignant effusion of the right hemithorax. 4. Fluid density structure within right side of pelvis abutting the right-side of the uterus is noted. This is indeterminate. Further investigation with pelvic sonogram may be helpful.   03/04/2020 - 05/06/2020 Chemotherapy   4 cycles of neoadjuvant carbo and Taxol    04/24/2020 Imaging   CT chest abdomen pelvis with contrast showed 1. Moderate left malignant pleural effusion, decreased in size compared to the prior examination. 2. Omental caking highly concerning for intraperitoneal metastatic disease. 3. No definite  primary malignancy confidently identified on today's examination. 4. Stable nodule in the lateral limb of the left adrenal gland dating back to at least 2017, presumably a benign lesions such as an adrenal adenoma.5. Aortic  atherosclerosis.6. Additional incidental findings, as above.     05/21/2020 Imaging   CT chest with contrast 1. Small left pleural effusion similar to prior CT. Overall no significant interval change compared to the prior CT. 2. Stable left adrenal nodule. 3. Aortic Atherosclerosis (ICD10-I70.0).   06/05/2020 Procedure    status post Total hysterectomy and bilateral salpingo-oophorectomy, Omentectomy, and Optimal (R<1) interval tumor debulking. A. Peritoneal nodule, excisional biopsy: Fibroadipose tissue with calcifications, negative for tumor. B. Uterus, bilateral ovaries and fallopian tubes, hysterectomy and bilateral salpingo-oophorectomy: Cervix: Squamous metaplasia. Endometrium: Inactive endometrium. Myometrium: Adenomyosis. Serosa: High grade serous adenocarcinoma. Right ovary: High grade serous adenocarcinoma, involving the ovarian surface and parenchyma. Right fallopian tube: No specific pathologic change. Left ovary: High grade serous adenocarcinoma, involving the ovarian surface and parenchyma. Left fallopian tube: High grade serous adenocarcinoma, involving tubal fimbria. Serous tubal intraepithelial carcinoma. Immunohistochemistry was performed on block B14 at [facility] to characterize the pathologic process and demonstrates the following immunophenotype in the cells of interest:POSITIVE:  p53, p16 (strong, patchy), Ki67 (elevated) This staining profile supports the above diagnosis. C. Peritoneal nodule, left pericolic gutter, excisional biopsy: High grade serous adenocarcinoma. D. Cul-de-sac, excisional biopsy: High grade serous adenocarcinoma. E. Omentum, excision: High grade serous adenocarcinoma (up to 3.0 cm).   TUMOR   Tumor Site:    Left fallopian tube   Histologic Type:    Serous carcinoma   Histologic Grade:    High grade   Tumor Size:    Greatest Dimension (Centimeters): 0.2 cm   Ovarian Surface Involvement:    Present     Laterality:    Bilateral    Fallopian Tube Surface Involvement:    Present     Laterality:    Left   Implants:    Present (sites): left peritoneum, cul-de-sac, omentum   Other Tissue / Organ Involvement:    Right ovary   Other Tissue / Organ Involvement:    Left ovary   Other Tissue / Organ Involvement:    Left fallopian tube   Other Tissue / Organ Involvement:    Pelvic peritoneum   Other Tissue / Organ Involvement:    Omentum   Largest Extrapelvic Peritoneal Focus:    Macroscopic (greater than 2 cm)   Peritoneal / Ascitic Fluid:    Not submitted / unknown   Pleural Fluid:    Not submitted / unknown   Treatment Effect:    Moderate response identified (CRS 2)   LYMPH NODES   Regional Lymph Nodes:    No lymph nodes submitted or found   PATHOLOGIC STAGE CLASSIFICATION (pTNM, AJCC 8th Edition)   TNM Descriptors:    y (post-treatment)   Primary Tumor (pT):    pT3c   Regional Lymph Nodes (pN):    pNX  FIGO Stage:    IIIC   + pleural effusion so STAGE IV No germline BRCA mutation, -Homologous recombination deficiency positive.  Myriad Genomic instability score:  positive, somatic BRCA1 positive   06/25/2020 Initial Diagnosis   Carcinoma of fallopian tube (HCC)   07/01/2020 - 08/12/2020 Chemotherapy   3 cycles of adjuvant chemotherapy with carboplatin  AUC 5, Taxol  175 mg/m.     09/02/2020 -  Chemotherapy   started on olaparib  300 mg twice daily.   11/16/2020 Imaging   CT chest  abdomen pelvis with contrast showed 1. Status post interval hysterectomy and bilateral salpingectomy. 2. Status post interval omentectomy. There is no residual suspicious peritoneal nodularity identified. Unchanged small nodules adjacent to the spleen which are stable compared to examinations dating back to 2017 and likely small splenules and/or lymph nodes. Attention on follow-up 3. Interval resolution of a previously seen moderate left pleural effusion. There is minimal residual pleural thickening. 4. Findings are consistent with treatment  response.5. No evidence of new metastatic disease in the chest, abdomen, or pelvis.   Aortic Atherosclerosis (ICD10-I70.0).   03/18/2021 Imaging   CT chest abdomen pelvis with contrast showed Chest Impression: 1. No evidence metastatic disease in the thorax. 2. Stable borderline enlarged mediastinal lymph nodes and RIGHT hilar node. Recommend attention on follow-up. Abdomen / Pelvis Impression: 1. No evidence of peritoneal or omental nodal metastatic recurrence. 2. No evidence of local recurrence in the pelvis. 3. No free fluid in the abdomen pelvis.    07/14/2021 Imaging   CT chest abdomen pelvis with contrast showed 1. No evidence of new or progressive disease within the chest,abdomen, or pelvis. 2. Stable borderline enlarged mediastinal and right hilar lymph nodes, nonspecific and possibly reactive. Recommend attention on follow-up imaging. 3. Moderate volume of formed stool throughout the colon suggestive of constipation. 4. Stable 1.4 cm left adrenal nodule previously characterized as an adenoma on PET-CT February 25, 2020. 5.  Aortic Atherosclerosis (ICD10-I70.0).   08/12/2021 Imaging   CT chest abdomen pelvis wo contrast  1. No evidence of new metastatic disease. 2. Left adrenal adenoma. 3.  Aortic atherosclerosis (ICD10-I70.0). 4.  Emphysema (ICD10-J43.9).   11/24/2021 Imaging   CT chest abdomen pelvis with contrast 1. No new or progressive findings in the chest, abdomen or pelvis.2. Scattered small perisplenic nodules are unchanged potentially small splenules. 3. Stable LEFT adrenal lesion, unchanged dating back to October of 2017. This likely represents an adenoma. 4. Post hysterectomy.5. Aortic atherosclerosis.   04/07/2022 Imaging   CT chest abdomen pelvis without contrast 1. Status post hysterectomy, oophorectomy, and omentectomy. No noncontrast evidence of recurrent or metastatic disease in the chest, abdomen, or pelvis. 2. Multiple small nodules adjacent to the spleen in  the left upper quadrant, measuring up to 0.9 x 0.7 cm. These are again most likely small accessory splenules, not previously FDG avid. Attention on follow-up. 3. Minimal emphysema, diffuse bilateral bronchial wall thickening, and background of very fine centrilobular nodules, most concentrated in the lung apices, consistent with smoking-related respiratory bronchiolitis.  Aortic Atherosclerosis (ICD10-I70.0) and Emphysema (ICD10-J43.9).   12/22/2022 Imaging   CT chest abdomen pelvis without contrast showed 1. Subtle areas of new bilateral peribronchovascular ground-glass and ground-glass nodularity, suspicious for mild infection, including atypical etiologies. 2. No evidence of metastatic disease, status post hysterectomy. 3. Left adrenal nodule which is similar back to 2021 PET and can be presumed benign. Likely a lipid poor adenoma. No specific follow-up indicated. 4.  Aortic Atherosclerosis (ICD10-I70.0).     04/21/2023 Imaging   CT chest abdomen pelvis w contrast  1. Stable examination without evidence of local recurrence or metastatic disease in the chest, abdomen or pelvis. 2. Stable 2 mm pulmonary nodule in the posterior left upper lobe,considered benign. 3. Stable 16 mm presumed lipid poor left adrenal adenoma. 4.  Aortic Atherosclerosis (ICD10-I70.0).   10/26/2023 Imaging   CT chest abdomen pelvis w contrast showed 1. Status post hysterectomy without suspicious pelvic mass. No convincing evidence of recurrent or metastatic disease in the chest, abdomen or pelvis. 2.  Stable tiny scattered pulmonary nodules, favored benign. 3. Stable presumed benign left adrenal adenoma measuring 16 mm.       INTERVAL HISTORY 68 year old female presents for follow-up of stage IV ovarian cancer,  somatic BRCA1 positive and HRD positive.  Patient has been off olaparib  since July/August 2024  Overall patient tolerates well denies any nausea vomiting diarrhea.  She feels well.   + Neuropathy,  numbness of her toes, symptoms are stable or improved.  She is no on any treatment due to intolerance and/or ineffectiveness of pharmacologist treatments.   Review of Systems  Constitutional:  Negative for appetite change, chills, fatigue and fever.  HENT:   Negative for hearing loss and voice change.   Eyes:  Negative for eye problems.  Respiratory:  Negative for chest tightness and cough.   Cardiovascular:  Negative for chest pain.  Gastrointestinal:  Negative for abdominal distention, abdominal pain and blood in stool.  Endocrine: Negative for hot flashes.  Genitourinary:  Negative for difficulty urinating and frequency.   Musculoskeletal:  Negative for arthralgias.  Skin:  Negative for itching and rash.  Neurological:  Positive for numbness. Negative for extremity weakness.  Hematological:  Negative for adenopathy.  Psychiatric/Behavioral:  Positive for sleep disturbance. Negative for confusion.         No Known Allergies   Past Medical History:  Diagnosis Date   Cancer (HCC)    ovarian cancer   Family history of breast cancer    Genetic testing 04/09/2020   No pathogenic variants identified on the Myriad MyRisk (germline) panel. The report date is 04/07/2020.   The [facility] gene panel offered by [employer] includes sequencing and deletion/duplication testing of the following 35 genes: APC, ATM, AXIN2, BARD1, BMPR1A, BRCA1, BRCA2, BRIP1, CHD1, CDK4, CDKN2A, CHEK2, EPCAM (large rearrangement only), HOXB13, GALNT12, MLH1, MSH2, MSH3, MSH6   HLD (hyperlipidemia)      Past Surgical History:  Procedure Laterality Date   CARPAL TUNNEL RELEASE Left    COLONOSCOPY WITH PROPOFOL  N/A 12/10/2020   Procedure: COLONOSCOPY WITH PROPOFOL ;  Surgeon: Janalyn Keene NOVAK, MD;  Location: [facility] ENDOSCOPY;  Service: Endoscopy;  Laterality: N/A;   ROBOTIC ASSISTED TOTAL HYSTERECTOMY WITH BILATERAL SALPINGO OOPHERECTOMY  06/05/2020   with omentectomy tumor debulking via  mini-laparotomy/hand assisted port   SHOULDER ARTHROSCOPY WITH SUBACROMIAL DECOMPRESSION AND OPEN ROTATOR C Right 02/01/2020   Procedure: RIGHT SHOULDER ARTHROSCOPIC SUBSCAPULARIS REPAIR, ROTATOR CUFF REPAIR, SUABCROMIAL DECOMPRESSION, AND BICEPS TENODESIS.;  Surgeon: Tobie Priest, MD;  Location: [facility] ORS;  Service: Orthopedics;  Laterality: Right;   TUBAL LIGATION      Social History   Socioeconomic History   Marital status: Single    Spouse name: Not on file   Number of children: Not on file   Years of education: Not on file   Highest education level: Not on file  Occupational History   Not on file  Tobacco Use   Smoking status: Former    Current packs/day: 0.00    Average packs/day: 0.8 packs/day for 40.0 years (30.0 ttl pk-yrs)    Types: Cigarettes    Start date: 02/06/1978    Quit date: 02/06/2018    Years since quitting: 6.4   Smokeless tobacco: Never   Tobacco comments:    quit 02/2018  Vaping Use   Vaping status: Never Used  Substance and Sexual Activity   Alcohol use: Yes    Comment: 2-3 glasses of wine per week,none last 24hrs   Drug use: No   Sexual activity:  Not on file  Other Topics Concern   Not on file  Social History Narrative   Lives in [place] alone. Work - [employer]   Diet: Regular   Exercise: walking   Social Drivers of Corporate Investment Banker Strain: Low Risk  (12/06/2023)   Overall Financial Resource Strain (CARDIA)    Difficulty of Paying Living Expenses: Not hard at all  Food Insecurity: No Food Insecurity (12/06/2023)   Hunger Vital Sign    Worried About Running Out of Food in the Last Year: Never true    Ran Out of Food in the Last Year: Never true  Transportation Needs: No Transportation Needs (12/06/2023)   PRAPARE - Administrator, Civil Service (Medical): No    Lack of Transportation (Non-Medical): No  Physical Activity: Inactive (12/06/2023)   Exercise Vital Sign    Days of Exercise per Week: 0 days    Minutes of Exercise  per Session: 0 min  Stress: No Stress Concern Present (12/06/2023)   [employer] of Occupational Health - Occupational Stress Questionnaire    Feeling of Stress : Not at all  Social Connections: Moderately Isolated (12/06/2023)   Social Connection and Isolation Panel    Frequency of Communication with Friends and Family: More than three times a week    Frequency of Social Gatherings with Friends and Family: Twice a week    Attends Religious Services: More than 4 times per year    Active Member of [employer] or Organizations: No    Attends Banker Meetings: Never    Marital Status: Widowed  Intimate Partner Violence: Not At Risk (12/06/2023)   Humiliation, Afraid, Rape, and Kick questionnaire    Fear of Current or Ex-Partner: No    Emotionally Abused: No    Physically Abused: No    Sexually Abused: No    Family History  Problem Relation Age of Onset   Cancer Mother        lymphoma   Stroke Sister    Cancer Brother        lung   Cancer Sister        lung   Lung cancer Brother    Bone cancer Sister    Breast cancer Cousin 45       maternal   Cancer Maternal Aunt        unk types     Current Outpatient Medications:    cholecalciferol  (VITAMIN D3) 10 MCG (400 UNIT) TABS tablet, Take 800 Units by mouth daily., Disp: , Rfl:    cyanocobalamin  1000 MCG tablet, Take 400 mcg by mouth daily., Disp: , Rfl:    ketotifen  (KP KETOTIFEN  FUMARATE) 0.035 % ophthalmic solution, Apply 1 drop to eye in the morning and at bedtime., Disp: 5 mL, Rfl: 0   pravastatin  (PRAVACHOL ) 40 MG tablet, TAKE 1 TABLET BY MOUTH EVERY DAY IN THE EVENING, Disp: 90 tablet, Rfl: 3   DULoxetine  (CYMBALTA ) 30 MG capsule, TAKE 1 CAPSULE BY MOUTH EVERY DAY (Patient not taking: Reported on 07/11/2024), Disp: 90 capsule, Rfl: 2   gabapentin  (NEURONTIN ) 300 MG capsule, Take 1 capsule (300 mg total) by mouth at bedtime. (Patient not taking: Reported on 07/11/2024), Disp: 60 capsule, Rfl: 2  Physical exam:   Vitals:   07/11/24 1058  BP: 124/64  Pulse: 68  Resp: 18  Temp: 98 F (36.7 C)  TempSrc: Tympanic  SpO2: 100%  Weight: 231 lb 3.2 oz (104.9 kg)   Physical Exam Constitutional:  General: She is not in acute distress.    Appearance: She is not diaphoretic.  HENT:     Head: Normocephalic.  Eyes:     General: No scleral icterus. Cardiovascular:     Rate and Rhythm: Normal rate and regular rhythm.     Heart sounds: No murmur heard. Pulmonary:     Effort: Pulmonary effort is normal. No respiratory distress.     Breath sounds: No wheezing.  Chest:     Chest wall: No tenderness.  Abdominal:     General: Bowel sounds are normal. There is no distension.     Palpations: Abdomen is soft.  Musculoskeletal:        General: Normal range of motion.     Cervical back: Normal range of motion and neck supple.  Skin:    General: Skin is warm and dry.     Findings: No erythema.  Neurological:     Mental Status: She is alert and oriented to person, place, and time. Mental status is at baseline.     Cranial Nerves: No cranial nerve deficit.     Motor: No abnormal muscle tone.     Coordination: Coordination normal.  Psychiatric:        Mood and Affect: Mood and affect normal.     Labs are reviewed and discussed with patient.    Latest Ref Rng & Units 07/11/2024   10:44 AM 10/26/2023    8:26 AM 04/21/2023    9:25 AM  CBC  WBC 4.0 - 10.5 K/uL 5.7  4.8  4.4   Hemoglobin 12.0 - 15.0 g/dL 87.4  87.8  87.4   Hematocrit 36.0 - 46.0 % 38.4  37.8  38.8   Platelets 150 - 400 K/uL 153  152  156       Latest Ref Rng & Units 07/11/2024   10:04 AM 10/26/2023    8:26 AM 04/21/2023    9:25 AM  CMP  Glucose 70 - 99 mg/dL 856  888  898   BUN 8 - 23 mg/dL 10  12  14    Creatinine 0.44 - 1.00 mg/dL 9.22  9.25  9.32   Sodium 135 - 145 mmol/L 141  137  138   Potassium 3.5 - 5.1 mmol/L 3.7  3.6  3.4   Chloride 98 - 111 mmol/L 107  108  105   CO2 22 - 32 mmol/L 24  24  25    Calcium 8.9 - 10.3  mg/dL 9.4  9.0  9.2   Total Protein 6.5 - 8.1 g/dL 6.8  7.0  7.4   Total Bilirubin 0.0 - 1.2 mg/dL 0.4  0.9  0.7   Alkaline Phos 38 - 126 U/L 63  52  48   AST 15 - 41 U/L 20  17  20    ALT 0 - 44 U/L 18  15  18     RADIOGRAPHIC STUDIES: I have personally reviewed the radiological images as listed and agreed with the findings in the report. No results found.

## 2024-07-11 NOTE — Assessment & Plan Note (Addendum)
 Recommend calcium and vitamin D  supplementation. Calcium level has improved

## 2024-07-12 LAB — CA 125: Cancer Antigen (CA) 125: 3.3 U/mL (ref 0.0–38.1)

## 2024-10-10 ENCOUNTER — Ambulatory Visit

## 2024-10-10 ENCOUNTER — Other Ambulatory Visit

## 2024-12-10 ENCOUNTER — Ambulatory Visit

## 2025-01-09 ENCOUNTER — Inpatient Hospital Stay: Admitting: Oncology

## 2025-01-09 ENCOUNTER — Inpatient Hospital Stay
